# Patient Record
Sex: Female | Born: 1950 | Race: White | Hispanic: No | Smoking: Never smoker
Health system: Southern US, Community
[De-identification: ages and names within clinical notes are randomized; demographics above are authoritative.]

## PROBLEM LIST (undated history)

## (undated) DIAGNOSIS — R319 Hematuria, unspecified: Secondary | ICD-10-CM

## (undated) DIAGNOSIS — R3915 Urgency of urination: Secondary | ICD-10-CM

## (undated) DIAGNOSIS — Z87442 Personal history of urinary calculi: Secondary | ICD-10-CM

## (undated) DIAGNOSIS — R350 Frequency of micturition: Secondary | ICD-10-CM

## (undated) DIAGNOSIS — C4491 Basal cell carcinoma of skin, unspecified: Secondary | ICD-10-CM

## (undated) DIAGNOSIS — M1990 Unspecified osteoarthritis, unspecified site: Secondary | ICD-10-CM

## (undated) DIAGNOSIS — C439 Malignant melanoma of skin, unspecified: Secondary | ICD-10-CM

## (undated) DIAGNOSIS — R300 Dysuria: Secondary | ICD-10-CM

## (undated) DIAGNOSIS — T7840XA Allergy, unspecified, initial encounter: Secondary | ICD-10-CM

## (undated) DIAGNOSIS — M810 Age-related osteoporosis without current pathological fracture: Secondary | ICD-10-CM

## (undated) DIAGNOSIS — C801 Malignant (primary) neoplasm, unspecified: Secondary | ICD-10-CM

## (undated) DIAGNOSIS — I10 Essential (primary) hypertension: Secondary | ICD-10-CM

## (undated) DIAGNOSIS — Z973 Presence of spectacles and contact lenses: Secondary | ICD-10-CM

## (undated) DIAGNOSIS — N200 Calculus of kidney: Secondary | ICD-10-CM

## (undated) HISTORY — DX: Malignant melanoma of skin, unspecified: C43.9

## (undated) HISTORY — DX: Basal cell carcinoma of skin, unspecified: C44.91

## (undated) HISTORY — DX: Essential (primary) hypertension: I10

## (undated) HISTORY — DX: Age-related osteoporosis without current pathological fracture: M81.0

## (undated) HISTORY — PX: BRAIN SURGERY: SHX531

## (undated) HISTORY — DX: Allergy, unspecified, initial encounter: T78.40XA

## (undated) HISTORY — PX: JOINT REPLACEMENT: SHX530

---

## 1990-09-20 HISTORY — PX: HEMIARTHROPLASTY SHOULDER FRACTURE: SUR653

## 1992-09-20 HISTORY — PX: HEMIARTHROPLASTY SHOULDER FRACTURE: SUR653

## 1998-09-30 ENCOUNTER — Other Ambulatory Visit: Admission: RE | Admit: 1998-09-30 | Discharge: 1998-09-30 | Payer: Self-pay

## 1998-10-29 ENCOUNTER — Ambulatory Visit (HOSPITAL_COMMUNITY): Admission: RE | Admit: 1998-10-29 | Discharge: 1998-10-29 | Payer: Self-pay | Admitting: Obstetrics and Gynecology

## 1998-10-29 ENCOUNTER — Encounter: Payer: Self-pay | Admitting: Obstetrics and Gynecology

## 1999-03-19 ENCOUNTER — Encounter: Payer: Self-pay | Admitting: Obstetrics and Gynecology

## 1999-03-19 ENCOUNTER — Ambulatory Visit (HOSPITAL_COMMUNITY): Admission: RE | Admit: 1999-03-19 | Discharge: 1999-03-19 | Payer: Self-pay | Admitting: Obstetrics and Gynecology

## 1999-04-12 ENCOUNTER — Emergency Department (HOSPITAL_COMMUNITY): Admission: EM | Admit: 1999-04-12 | Discharge: 1999-04-12 | Payer: Self-pay | Admitting: Emergency Medicine

## 1999-04-12 ENCOUNTER — Encounter: Payer: Self-pay | Admitting: Emergency Medicine

## 1999-05-07 ENCOUNTER — Encounter: Admission: RE | Admit: 1999-05-07 | Discharge: 1999-08-05 | Payer: Self-pay | Admitting: Orthopedic Surgery

## 2012-12-26 ENCOUNTER — Encounter: Payer: Self-pay | Admitting: Nurse Practitioner

## 2013-03-19 ENCOUNTER — Ambulatory Visit (INDEPENDENT_AMBULATORY_CARE_PROVIDER_SITE_OTHER): Payer: No Typology Code available for payment source | Admitting: Physician Assistant

## 2013-03-19 ENCOUNTER — Encounter: Payer: Self-pay | Admitting: Physician Assistant

## 2013-03-19 VITALS — BP 143/83 | HR 84 | Temp 98.9°F | Ht 62.0 in | Wt 122.0 lb

## 2013-03-19 DIAGNOSIS — J329 Chronic sinusitis, unspecified: Secondary | ICD-10-CM

## 2013-03-19 MED ORDER — AZITHROMYCIN 250 MG PO TABS: ORAL_TABLET | ORAL | Status: DC

## 2013-03-19 NOTE — Progress Notes (Signed)
Subjective:     Patient ID: [name] Page, female   DOB: 1951-05-31, 62 y.o.   MRN: [id number]  HPI Pt with sinus pain/pressure for 2 weeks She also has had PND, dry cough, and loss of voice OTC cough meds have not helped sx Sx are worse with laying  Review of Systems  All other systems reviewed and are negative.       Objective:   Physical Exam  Nursing note and vitals reviewed. Ears- canal/TM's nl bilat Oral- no lesions, + thick PND, no increase in tonsil size No cerv nodes Heart- RRR w/o M Lungs- CTA     Assessment:     1. Sinusitis        Plan:     Zpack rx - pt states has worked well prev OTC Claritin D Fluids Rest F/U prn

## 2013-03-19 NOTE — Patient Instructions (Signed)

## 2013-08-10 ENCOUNTER — Encounter (HOSPITAL_COMMUNITY): Payer: Self-pay | Admitting: *Deleted

## 2013-08-10 ENCOUNTER — Observation Stay (HOSPITAL_COMMUNITY)
Admission: AD | Admit: 2013-08-10 | Discharge: 2013-08-11 | Disposition: A | Discharge status: Home / Self Care | Payer: PRIVATE HEALTH INSURANCE | Source: Ambulatory Visit | Attending: Urology | Admitting: Urology

## 2013-08-10 DIAGNOSIS — M810 Age-related osteoporosis without current pathological fracture: Secondary | ICD-10-CM | POA: Insufficient documentation

## 2013-08-10 DIAGNOSIS — N1330 Unspecified hydronephrosis: Secondary | ICD-10-CM | POA: Insufficient documentation

## 2013-08-10 DIAGNOSIS — R34 Anuria and oliguria: Secondary | ICD-10-CM | POA: Insufficient documentation

## 2013-08-10 DIAGNOSIS — N200 Calculus of kidney: Principal | ICD-10-CM | POA: Insufficient documentation

## 2013-08-10 LAB — MRSA PCR SCREENING: MRSA by PCR: NEGATIVE

## 2013-08-10 MED ORDER — INFLUENZA VAC SPLIT QUAD 0.5 ML IM SUSP
0.5000 mL | INTRAMUSCULAR | Status: AC
  Administered 2013-08-11: 0.5 mL via INTRAMUSCULAR
  Filled 2013-08-10 (×2): qty 0.5

## 2013-08-10 MED ORDER — PROMETHAZINE HCL 25 MG/ML IJ SOLN
25.0000 mg | INTRAMUSCULAR | Status: DC | PRN
  Administered 2013-08-10: 25 mg via INTRAVENOUS
  Filled 2013-08-10: qty 1

## 2013-08-10 MED ORDER — DOCUSATE SODIUM 100 MG PO CAPS
100.0000 mg | ORAL_CAPSULE | Freq: Two times a day (BID) | ORAL | Status: DC
  Administered 2013-08-10: 100 mg via ORAL
  Filled 2013-08-10 (×3): qty 1

## 2013-08-10 MED ORDER — HYDROMORPHONE HCL PF 1 MG/ML IJ SOLN
0.5000 mg | INTRAMUSCULAR | Status: DC | PRN
  Administered 2013-08-10: 1 mg via INTRAVENOUS
  Filled 2013-08-10: qty 1

## 2013-08-10 MED ORDER — OXYCODONE HCL 5 MG PO TABS
5.0000 mg | ORAL_TABLET | ORAL | Status: DC | PRN
  Filled 2013-08-10: qty 1

## 2013-08-10 MED ORDER — SENNA 8.6 MG PO TABS
1.0000 | ORAL_TABLET | Freq: Two times a day (BID) | ORAL | Status: DC
  Administered 2013-08-10: 8.6 mg via ORAL
  Filled 2013-08-10: qty 1

## 2013-08-10 MED ORDER — ONDANSETRON HCL 4 MG/2ML IJ SOLN
4.0000 mg | INTRAMUSCULAR | Status: DC | PRN
  Administered 2013-08-10: 18:00:00 4 mg via INTRAVENOUS
  Filled 2013-08-10: qty 2

## 2013-08-10 MED ORDER — KCL IN DEXTROSE-NACL 20-5-0.45 MEQ/L-%-% IV SOLN
INTRAVENOUS | Status: DC
  Administered 2013-08-10 – 2013-08-11 (×2): via INTRAVENOUS
  Filled 2013-08-10 (×3): qty 1000

## 2013-08-10 NOTE — H&P (Signed)
[name] Page is an 62 y.o. female.    Chief Complaint: Rt Ureteral Stone, Bilateral Intrarenal Stones, Refractory renal colic  HPI:    1 - Nephrolithiasis - Pt with long h/o bilateral stone disease. Most recnet CT 08/2012 from [place] with Rt 15mm intrarenal + scatteredbilateral intrarenal wihtout sig hydro. She has had at least 30 prior episodes all sponatnouesly passed. NO hematuria or recurrent UTI.  Recent Surveillance 05/2013 - [facility] / Renal US - Rt 15mm renal, 8mm lower pole. Lt 5mm upper pole likely stones 07/2013 - CT Rt 15mm lower pole, 6mm ureteral, Lt 5mm mid pole, scattered punctate.  2 - Medical Stone Disease / Oliguria / Hypocitraturia-   Eval 2014 - BMP,PTH,Urate - normal ; Composition - CaOx ; 24 hr Urine - low volume (0.5L/day) and low citrate. Subsequenly placed on K-Cit 20 MEQ BID  3 - Left Renal Cyst - 1.5cm lower pole non-complex cyst by [place] 05/2013.  4 - Acute Right Renal Colic - PT with 1 days mod-severe rt colickly flank pain with nausea and emesis. No fevers. UA without sig infectious parameters. CT today shows rt distal ureteral stone with hydro and bilateral intrarenal stones.     Past Medical History  Diagnosis Date  . Osteoporosis   . Kidney stone     Past Surgical History  Procedure Laterality Date  . Shoulder surgery Left 1992    partial shoulder replacement    Family History  Problem Relation Age of Onset  . Cancer Mother   . Atrial fibrillation Father   . Hyperlipidemia Father   . Hypertension Father   . Hypertension Sister   . Hyperlipidemia Sister   . Thyroid disease Sister    Social History:  reports that she has never smoked. She has never used smokeless tobacco. She reports that she does not drink alcohol or use illicit drugs.  Allergies:  Allergies  Allergen Reactions  . Sulfa Antibiotics     No prescriptions prior to admission    No results found for this or any previous visit (from the past 48 hour(s)). No results  found.  Review of Systems  Constitutional: Negative.  Negative for fever and chills.  Eyes: Negative.   Cardiovascular: Negative.   Gastrointestinal: Positive for nausea and vomiting.  Genitourinary: Positive for flank pain.  Musculoskeletal: Negative.   Skin: Negative.   Neurological: Negative.   Endo/Heme/Allergies: Negative.   Psychiatric/Behavioral: Negative.     There were no vitals taken for this visit. Physical Exam  Constitutional: She is oriented to person, place, and time. She appears well-developed and well-nourished.  HENT:  Head: Normocephalic and atraumatic.  Eyes: EOM are normal. Pupils are equal, round, and reactive to light.  Neck: Normal range of motion. Neck supple.  Cardiovascular: Normal rate.   Respiratory: Effort normal.  GI: Soft. Bowel sounds are normal.  Genitourinary:  Moderate Rt CVAT  Musculoskeletal: Normal range of motion.  Neurological: She is alert and oriented to person, place, and time.  Skin: Skin is warm and dry.  Psychiatric: She has a normal mood and affect. Her behavior is normal. Judgment and thought content normal.     Assessment/Plan  1 - Nephrolithiasis -  Large right, and lesser left stone burden with acute right sided colic. Again discussed options of medical therapy, intaptient admission for stents bilaterally in AM and then ureteroscopy in several weeks for goal of stone free and she has chosen the later. This is reasonable. Admit for pain /nausea control, NPO p  MN, Consent for bilateral stents in AM. Risks includign bleeding, infection, non-cure, damage to kidney / ureter / bladder as well as rare risks such as DVT, PT, MI, CVA, Mortality discussed.  2 - Medical Stone Disease / Oliguria / Hypocitraturia- Contiue K-Cit and increased hydration.   Hyun Reali 08/10/2013, 5:19 PM

## 2013-08-11 ENCOUNTER — Observation Stay (HOSPITAL_COMMUNITY): Payer: PRIVATE HEALTH INSURANCE | Admitting: Anesthesiology

## 2013-08-11 ENCOUNTER — Encounter (HOSPITAL_COMMUNITY): Payer: PRIVATE HEALTH INSURANCE | Admitting: Anesthesiology

## 2013-08-11 ENCOUNTER — Encounter (HOSPITAL_COMMUNITY)
Admission: AD | Disposition: A | Discharge status: Home / Self Care | Payer: Self-pay | Source: Ambulatory Visit | Attending: Urology

## 2013-08-11 HISTORY — PX: CYSTOSCOPY WITH RETROGRADE PYELOGRAM, URETEROSCOPY AND STENT PLACEMENT: SHX5789

## 2013-08-11 SURGERY — CYSTOURETEROSCOPY, WITH RETROGRADE PYELOGRAM AND STENT INSERTION
Anesthesia: General | Site: Ureter | Laterality: Bilateral | Wound class: Clean Contaminated

## 2013-08-11 MED ORDER — IOHEXOL 300 MG/ML  SOLN
INTRAMUSCULAR | Status: DC | PRN
  Administered 2013-08-11: 07:00:00 20 mL via INTRAVENOUS

## 2013-08-11 MED ORDER — FENTANYL CITRATE 0.05 MG/ML IJ SOLN: 25.0000 ug | INTRAMUSCULAR | Status: DC | PRN

## 2013-08-11 MED ORDER — CEFAZOLIN (ANCEF) 1 G IV SOLR: 1.0000 g | INTRAVENOUS | Status: DC

## 2013-08-11 MED ORDER — PROMETHAZINE HCL 25 MG/ML IJ SOLN: 6.2500 mg | INTRAMUSCULAR | Status: DC | PRN

## 2013-08-11 MED ORDER — LACTATED RINGERS IV SOLN
INTRAVENOUS | Status: DC | PRN
  Administered 2013-08-11: 07:00:00 via INTRAVENOUS

## 2013-08-11 MED ORDER — PROPOFOL 10 MG/ML IV BOLUS
INTRAVENOUS | Status: DC | PRN
  Administered 2013-08-11: 150 mg via INTRAVENOUS

## 2013-08-11 MED ORDER — CEFAZOLIN SODIUM-DEXTROSE 2-3 GM-% IV SOLR
INTRAVENOUS | Status: AC
  Filled 2013-08-11: qty 50

## 2013-08-11 MED ORDER — LIDOCAINE HCL (PF) 2 % IJ SOLN
INTRAMUSCULAR | Status: DC | PRN
  Administered 2013-08-11: 75 mg via INTRADERMAL

## 2013-08-11 MED ORDER — LACTATED RINGERS IV SOLN: INTRAVENOUS | Status: DC

## 2013-08-11 MED ORDER — ONDANSETRON HCL 4 MG/2ML IJ SOLN
INTRAMUSCULAR | Status: AC
  Filled 2013-08-11: qty 2

## 2013-08-11 MED ORDER — LIDOCAINE HCL (CARDIAC) 20 MG/ML IV SOLN
INTRAVENOUS | Status: AC
  Filled 2013-08-11: qty 5

## 2013-08-11 MED ORDER — ONDANSETRON HCL 4 MG/2ML IJ SOLN
INTRAMUSCULAR | Status: DC | PRN
  Administered 2013-08-11: 4 mg via INTRAVENOUS

## 2013-08-11 MED ORDER — SODIUM CHLORIDE 0.9 % IR SOLN
Status: DC | PRN
  Administered 2013-08-11: 1000 mL

## 2013-08-11 MED ORDER — OXYCODONE-ACETAMINOPHEN 5-325 MG PO TABS: 1.0000 | ORAL_TABLET | ORAL | Status: DC | PRN

## 2013-08-11 MED ORDER — PROPOFOL 10 MG/ML IV BOLUS
INTRAVENOUS | Status: AC
  Filled 2013-08-11: qty 20

## 2013-08-11 MED ORDER — FENTANYL CITRATE 0.05 MG/ML IJ SOLN
INTRAMUSCULAR | Status: DC | PRN
  Administered 2013-08-11: 50 ug via INTRAVENOUS
  Administered 2013-08-11 (×2): 25 ug via INTRAVENOUS

## 2013-08-11 MED ORDER — CEFAZOLIN SODIUM-DEXTROSE 2-3 GM-% IV SOLR
2.0000 g | Freq: Once | INTRAVENOUS | Status: AC
  Administered 2013-08-11: 2 g via INTRAVENOUS

## 2013-08-11 MED ORDER — FENTANYL CITRATE 0.05 MG/ML IJ SOLN
INTRAMUSCULAR | Status: AC
  Filled 2013-08-11: qty 2

## 2013-08-11 MED ORDER — SENNOSIDES-DOCUSATE SODIUM 8.6-50 MG PO TABS: 1.0000 | ORAL_TABLET | Freq: Two times a day (BID) | ORAL | Status: DC

## 2013-08-11 MED ORDER — 0.9 % SODIUM CHLORIDE (POUR BTL) OPTIME
TOPICAL | Status: DC | PRN
  Administered 2013-08-11: 07:00:00 1000 mL

## 2013-08-11 MED ORDER — OXYBUTYNIN CHLORIDE 5 MG PO TABS: 5.0000 mg | ORAL_TABLET | Freq: Three times a day (TID) | ORAL | Status: DC | PRN

## 2013-08-11 SURGICAL SUPPLY — 24 items
BAG URINE DRAINAGE (UROLOGICAL SUPPLIES) ×2 IMPLANT
BASKET LASER NITINOL 1.9FR (BASKET) IMPLANT
BASKET STNLS GEMINI 4WIRE 3FR (BASKET) IMPLANT
BASKET ZERO TIP NITINOL 2.4FR (BASKET) IMPLANT
BSKT STON RTRVL 120 1.9FR (BASKET)
BSKT STON RTRVL GEM 120X11 3FR (BASKET)
BSKT STON RTRVL ZERO TP 2.4FR (BASKET)
CATH INTERMIT  6FR 70CM (CATHETERS) ×2 IMPLANT
DRAPE CAMERA CLOSED 9X96 (DRAPES) ×2 IMPLANT
ELECT REM PT RETURN 9FT ADLT (ELECTROSURGICAL)
ELECTRODE REM PT RTRN 9FT ADLT (ELECTROSURGICAL) IMPLANT
FIBER LASER FLEXIVA 200 (UROLOGICAL SUPPLIES) IMPLANT
FIBER LASER FLEXIVA 365 (UROLOGICAL SUPPLIES) IMPLANT
GLOVE BIOGEL M STRL SZ7.5 (GLOVE) ×2 IMPLANT
GOWN PREVENTION PLUS LG XLONG (DISPOSABLE) ×2 IMPLANT
GUIDEWIRE ANG ZIPWIRE 038X150 (WIRE) ×2 IMPLANT
GUIDEWIRE STR DUAL SENSOR (WIRE) ×2 IMPLANT
IV NS IRRIG 3000ML ARTHROMATIC (IV SOLUTION) ×2 IMPLANT
PACK CYSTO (CUSTOM PROCEDURE TRAY) ×2 IMPLANT
STENT CONTOUR 6FRX24X.038 (STENTS) ×1 IMPLANT
STENT POLARIS 5FRX24 (STENTS) ×1 IMPLANT
SYRINGE 10CC LL (SYRINGE) ×1 IMPLANT
SYRINGE IRR TOOMEY STRL 70CC (SYRINGE) IMPLANT
TUBE FEEDING 8FR 16IN STR KANG (MISCELLANEOUS) ×2 IMPLANT

## 2013-08-11 NOTE — Progress Notes (Signed)
Pt with no nausea, has no problem voiding, ambulates well, d/c home at this time

## 2013-08-11 NOTE — Progress Notes (Signed)
Pt has voided pink tinged urine since returning from surgery, ambulated in room with post op weakness, tolerated gingerale.  Will order lunch tray and reassess.

## 2013-08-11 NOTE — Anesthesia Postprocedure Evaluation (Signed)
  Anesthesia Post-op Note  Patient: [name] Page  Procedure(s) Performed: Procedure(s) (LRB): CYSTOSCOPY WITH RETROGRADE PYELOGRAM, URETEROSCOPY AND STENT PLACEMENT (Bilateral)  Patient Location: PACU  Anesthesia Type: General  Level of Consciousness: awake and alert   Airway and Oxygen Therapy: Patient Spontanous Breathing  Post-op Pain: mild  Post-op Assessment: Post-op Vital signs reviewed, Patient's Cardiovascular Status Stable, Respiratory Function Stable, Patent Airway and No signs of Nausea or vomiting  Last Vitals:  Filed Vitals:   08/11/13 0828  BP: 159/68  Pulse: 83  Temp: 36.9 C  Resp: 14    Post-op Vital Signs: stable   Complications: No apparent anesthesia complications

## 2013-08-11 NOTE — Transfer of Care (Signed)
Immediate Anesthesia Transfer of Care Note  Patient: [name] Page  Procedure(s) Performed: Procedure(s): CYSTOSCOPY WITH RETROGRADE PYELOGRAM, URETEROSCOPY AND STENT PLACEMENT (Bilateral)  Patient Location: PACU  Anesthesia Type:General  Level of Consciousness: awake, alert , oriented and patient cooperative  Airway & Oxygen Therapy: Patient Spontanous Breathing and Patient connected to face mask oxygen  Post-op Assessment: Report given to PACU RN, Post -op Vital signs reviewed and stable and Patient moving all extremities X 4  Post vital signs: Reviewed and stable  Complications: No apparent anesthesia complications

## 2013-08-11 NOTE — Anesthesia Preprocedure Evaluation (Addendum)
Anesthesia Evaluation  Patient identified by MRN, date of birth, ID band Patient awake    Reviewed: Allergy & Precautions, H&P , NPO status , Patient's Chart, lab work & pertinent test results  History of Anesthesia Complications Negative for: history of anesthetic complications  Airway Mallampati: II TM Distance: >3 FB Neck ROM: Full    Dental no notable dental hx.    Pulmonary neg pulmonary ROS,  breath sounds clear to auscultation  Pulmonary exam normal       Cardiovascular Exercise Tolerance: Good negative cardio ROS  Rhythm:Regular Rate:Normal     Neuro/Psych negative neurological ROS  negative psych ROS   GI/Hepatic negative GI ROS, Neg liver ROS,   Endo/Other  negative endocrine ROS  Renal/GU Renal diseasenegative Renal ROSH/o kidney stones  negative genitourinary   Musculoskeletal negative musculoskeletal ROS (+)   Abdominal   Peds negative pediatric ROS (+)  Hematology negative hematology ROS (+)   Anesthesia Other Findings H/o osteoporosis  Reproductive/Obstetrics negative OB ROS                         Anesthesia Physical Anesthesia Plan  ASA: II  Anesthesia Plan: General   Post-op Pain Management:    Induction: Intravenous  Airway Management Planned: LMA  Additional Equipment:   Intra-op Plan:   Post-operative Plan: Extubation in OR  Informed Consent: I have reviewed the patients History and Physical, chart, labs and discussed the procedure including the risks, benefits and alternatives for the proposed anesthesia with the patient or authorized representative who has indicated his/her understanding and acceptance.   Dental advisory given  Plan Discussed with: CRNA  Anesthesia Plan Comments:         Anesthesia Quick Evaluation

## 2013-08-11 NOTE — Brief Op Note (Signed)
08/10/2013 - 08/11/2013  7:47 AM  PATIENT:  [name] Page  62 y.o. female  PRE-OPERATIVE DIAGNOSIS:  bilateral stones  POST-OPERATIVE DIAGNOSIS:  bilateral stones  PROCEDURE:  Procedure(s): CYSTOSCOPY WITH RETROGRADE PYELOGRAM, URETEROSCOPY AND STENT PLACEMENT (Bilateral)  SURGEON:  Surgeon(s) and Role:    * Sebastian Ache, MD - Primary  PHYSICIAN ASSISTANT:   ASSISTANTS: none   ANESTHESIA:   general  EBL:     BLOOD ADMINISTERED:none  DRAINS: none   LOCAL MEDICATIONS USED:  NONE  SPECIMEN:  No Specimen  DISPOSITION OF SPECIMEN:  N/A  COUNTS:  YES  TOURNIQUET:  * No tourniquets in log *  DICTATION: .Other Dictation: Dictation Number [id number]  PLAN OF CARE: Discharge to home after PACU  PATIENT DISPOSITION:  PACU - hemodynamically stable.   Delay start of Pharmacological VTE agent (>24hrs) due to surgical blood loss or risk of bleeding: not applicable

## 2013-08-12 NOTE — Op Note (Signed)
[name], [name]              ACCOUNT NO.:  [id number]  MEDICAL RECORD NO.:  [id number]  LOCATION:  1443                         FACILITY:  [facility]  PHYSICIAN:  Sebastian Ache, MD     DATE OF BIRTH:  07/15/1951  DATE OF PROCEDURE: 08/11/2013                              OPERATIVE REPORT   DIAGNOSIS:  Right ureteral stone, bilateral renal stones, refractory renal colic.  PROCEDURE:  Cystoscopy, bilateral retrograde pyelogram interpretation, insertion of bilateral ureteral stents, right 5 x 24 Polaris, no tether, left 6 x 24 Contour, no tether.  FINDINGS: 1. Right moderate hydroureteronephrosis to filling defect in distal     ureter consistent with stone. 2. Unremarkable left retrograde pyelogram. 3. Unremarkable urinary bladder.  INDICATION:  [name] is a very pleasant 62 year old lady with history of recurrent nephrolithiasis for many years, mostly managed medically.  She presented yesterday to my office with refractory colic and right flank pain and nausea and vomiting.  A CT scan corroborated a right distal ureteral stone and multifocal bilateral renal stones. Options were discussed including outpatient medical therapy, inpatient medical therapy, as well as cystoscopy, bilateral ureteral stents as bridge to definitive stone therapy with goal of stone free, and she wished to proceed with the latter more aggressive approach.  Informed consent was obtained and placed in medical record.  As the patient was not n.p.o. yesterday, she was admitted for IV fluids and pain control with plan for elective procedure today.  PROCEDURE IN DETAIL:  The patient being, [name] [name], verified. Procedure being cystoscopy, bilateral ureteral stents was confirmed. Procedure was carried out.  Time-out was performed.  Intravenous antibiotics were administered.  General LMA anesthesia was introduced. The patient was placed into a low lithotomy position.  Sterile field was created by  prepping and draping the patient's vagina, introitus, and proximal thighs using iodine x3.  Next, cystourethroscopy was performed using a 22-French rigid cystoscope with 12-degree offset lens. Inspection of the urinary bladder revealed no diverticula, calcifications, papular lesions.  The right ureteral orifice was cannulated with a 5-French end-hole catheter and right retrograde pyelogram was obtained.  Right retrograde pyelogram demonstrated single right ureter, single system, right kidney.  There was a filling defect in distal ureter consistent with known stone.  There was moderate hydroureteronephrosis above this.  A 0.038 Glidewire was advanced at the level of the upper pole over which a new 5 x 24 Polaris type stent was placed.  Good proximal and distal deployment were noted.  There was efflux of copious proteinaceous-appearing urine around into the distal end of the stent following placement.  Attention was then directed to the left side.  The left ureteral orifice was cannulated with a 5-French end-hole catheter and left retrograde pyelogram was obtained.  Left retrograde pyelogram demonstrated a single left ureter, single system left kidney.  No filling defects or narrowing noted.  A 0.038 Glidewire was advanced at the level of the upper pole, over which a new 6 x 24 contour stent was placed, good proximal and distal curl were noted.  Efflux of urine was seen around and through the distal end of the stent.  Bladder was emptied per cystoscope.  Procedure was then  terminated.  The patient tolerated the procedure well.  There were no immediate periprocedural complications.  The patient was taken to postanesthesia care unit in stable condition.          ______________________________ Sebastian Ache, MD     TM/MEDQ  D:  08/11/2013  T:  08/12/2013  Job:  [id number]

## 2013-08-13 ENCOUNTER — Encounter (HOSPITAL_COMMUNITY): Payer: Self-pay | Admitting: Urology

## 2013-08-14 ENCOUNTER — Other Ambulatory Visit: Payer: Self-pay | Admitting: Urology

## 2013-08-21 NOTE — Discharge Summary (Signed)
Physician Discharge Summary  Patient ID: [name] DEGUZMAN MRN: [id number] DOB/AGE: [birth year] 62 y.o.  Admit date: 08/10/2013 Discharge date: 08/21/2013  Admission Diagnoses: Bilateral Nephrolithiasis  Discharge Diagnoses:  Active Problems:   * No active hospital problems. *   Discharged Condition: good  Hospital Course:  Pt admitted from office 11/21 with refracotry pain, nausea, vomitting from bilateral nephrolithiasis. ON AM of 11/22 she underwent operative bilateral ureteral stenting. By PM of 11/22 her pain was controlled, ambulatoyr, voiding, and felt to be adequate for discharge.   Consults: None  Significant Diagnostic Studies: inraoperative retrograde pyleograms as per op note  Treatments: surgery: operative bilateral ureteral stenting 08/11/2013  Discharge Exam: Blood pressure 159/68, pulse 83, temperature 98.4 F (36.9 C), temperature source Oral, resp. rate 14, height 5\' 2"  (1.575 m), weight 55.5 kg (122 lb 5.7 oz), SpO2 97.00%. General appearance: alert, cooperative and appears stated age Head: Normocephalic, without obvious abnormality, atraumatic Eyes: conjunctivae/corneas clear. PERRL, EOM's intact. Fundi benign. Ears: normal TM's and external ear canals both ears Nose: Nares normal. Septum midline. Mucosa normal. No drainage or sinus tenderness. Throat: lips, mucosa, and tongue normal; teeth and gums normal Neck: no adenopathy, no carotid bruit, no JVD, supple, symmetrical, trachea midline and thyroid not enlarged, symmetric, no tenderness/mass/nodules Back: symmetric, no curvature. ROM normal. No CVA tenderness. Resp: clear to auscultation bilaterally Cardio: regular rate and rhythm, S1, S2 normal, no murmur, click, rub or gallop GI: soft, non-tender; bowel sounds normal; no masses,  no organomegaly Extremities: extremities normal, atraumatic, no cyanosis or edema Pulses: 2+ and symmetric Skin: Skin color, texture, turgor normal. No rashes or lesions Lymph  nodes: Cervical, supraclavicular, and axillary nodes normal. Neurologic: Grossly normal  Disposition: 01-Home or Self Care     Medication List         alendronate 70 MG tablet  Commonly known as:  FOSAMAX  Take 70 mg by mouth every 7 (seven) days. Take with a full glass of water on an empty stomach.     multivitamin with minerals Tabs tablet  Take 1 tablet by mouth daily.     oxybutynin 5 MG tablet  Commonly known as:  DITROPAN  Take 1 tablet (5 mg total) by mouth every 8 (eight) hours as needed for bladder spasms. Or stent discomfort.     oxyCODONE-acetaminophen 5-325 MG per tablet  Commonly known as:  ROXICET  Take 1 tablet by mouth every 4 (four) hours as needed for severe pain. Post-operatively     potassium citrate 10 MEQ (1080 MG) SR tablet  Commonly known as:  UROCIT-K  Take 10 mEq by mouth 3 (three) times daily with meals.     senna-docusate 8.6-50 MG per tablet  Commonly known as:  Senokot-S  Take 1 tablet by mouth 2 (two) times daily. While taking pain meds to prevent constipation           Follow-up Information   Follow up with Sebastian Ache, MD. (We will contact you to arrange next surgery in several weeks.)    Specialty:  Urology   Contact information:   [address] [place] [address] [phone]       Signed: Sebastian Ache 08/21/2013, 5:14 PM

## 2013-08-28 ENCOUNTER — Encounter (HOSPITAL_BASED_OUTPATIENT_CLINIC_OR_DEPARTMENT_OTHER): Payer: Self-pay | Admitting: *Deleted

## 2013-08-28 NOTE — Progress Notes (Signed)
NPO AFTER MN. ARRIVE AT 1610. NEEDS ISTAT 8. IF NEEDED MAY TAKE OXYCODONE , OXYBUTYNIN AM DOS W/ SIPS OF WATER.

## 2013-08-29 ENCOUNTER — Ambulatory Visit (HOSPITAL_BASED_OUTPATIENT_CLINIC_OR_DEPARTMENT_OTHER)
Admission: RE | Admit: 2013-08-29 | Discharge: 2013-08-29 | Disposition: A | Discharge status: Home / Self Care | Payer: PRIVATE HEALTH INSURANCE | Source: Ambulatory Visit | Attending: Urology | Admitting: Urology

## 2013-08-29 ENCOUNTER — Encounter (HOSPITAL_BASED_OUTPATIENT_CLINIC_OR_DEPARTMENT_OTHER): Payer: PRIVATE HEALTH INSURANCE | Admitting: Anesthesiology

## 2013-08-29 ENCOUNTER — Encounter (HOSPITAL_BASED_OUTPATIENT_CLINIC_OR_DEPARTMENT_OTHER)
Admission: RE | Disposition: A | Discharge status: Home / Self Care | Payer: Self-pay | Source: Ambulatory Visit | Attending: Urology

## 2013-08-29 ENCOUNTER — Encounter (HOSPITAL_BASED_OUTPATIENT_CLINIC_OR_DEPARTMENT_OTHER): Payer: Self-pay | Admitting: *Deleted

## 2013-08-29 ENCOUNTER — Ambulatory Visit (HOSPITAL_BASED_OUTPATIENT_CLINIC_OR_DEPARTMENT_OTHER): Payer: PRIVATE HEALTH INSURANCE | Admitting: Anesthesiology

## 2013-08-29 DIAGNOSIS — N200 Calculus of kidney: Secondary | ICD-10-CM | POA: Insufficient documentation

## 2013-08-29 DIAGNOSIS — M810 Age-related osteoporosis without current pathological fracture: Secondary | ICD-10-CM | POA: Insufficient documentation

## 2013-08-29 DIAGNOSIS — N201 Calculus of ureter: Secondary | ICD-10-CM | POA: Insufficient documentation

## 2013-08-29 HISTORY — DX: Presence of spectacles and contact lenses: Z97.3

## 2013-08-29 HISTORY — DX: Calculus of kidney: N20.0

## 2013-08-29 HISTORY — DX: Frequency of micturition: R35.0

## 2013-08-29 HISTORY — DX: Personal history of urinary calculi: Z87.442

## 2013-08-29 HISTORY — PX: CYSTOSCOPY WITH URETEROSCOPY AND STENT PLACEMENT: SHX6377

## 2013-08-29 HISTORY — DX: Unspecified osteoarthritis, unspecified site: M19.90

## 2013-08-29 HISTORY — DX: Urgency of urination: R39.15

## 2013-08-29 HISTORY — PX: HOLMIUM LASER APPLICATION: SHX5852

## 2013-08-29 HISTORY — DX: Dysuria: R30.0

## 2013-08-29 HISTORY — DX: Hematuria, unspecified: R31.9

## 2013-08-29 LAB — POCT I-STAT, CHEM 8
BUN: 25 mg/dL — ABNORMAL HIGH (ref 6–23)
Chloride: 107 mEq/L (ref 96–112)
Potassium: 4.1 mEq/L (ref 3.5–5.1)
Sodium: 145 mEq/L (ref 135–145)
TCO2: 22 mmol/L (ref 0–100)

## 2013-08-29 SURGERY — CYSTOURETEROSCOPY, WITH STENT INSERTION
Anesthesia: General | Site: Ureter | Laterality: Bilateral

## 2013-08-29 MED ORDER — FENTANYL CITRATE 0.05 MG/ML IJ SOLN
INTRAMUSCULAR | Status: AC
  Filled 2013-08-29: qty 2

## 2013-08-29 MED ORDER — SODIUM CHLORIDE 0.9 % IR SOLN
Status: DC | PRN
  Administered 2013-08-29: 5000 mL

## 2013-08-29 MED ORDER — LACTATED RINGERS IV SOLN
INTRAVENOUS | Status: DC
  Administered 2013-08-29 (×2): via INTRAVENOUS
  Filled 2013-08-29: qty 1000

## 2013-08-29 MED ORDER — LIDOCAINE HCL (CARDIAC) 20 MG/ML IV SOLN
INTRAVENOUS | Status: DC | PRN
  Administered 2013-08-29: 80 mg via INTRAVENOUS

## 2013-08-29 MED ORDER — KETOROLAC TROMETHAMINE 30 MG/ML IJ SOLN
INTRAMUSCULAR | Status: DC | PRN
  Administered 2013-08-29: 30 mg via INTRAVENOUS

## 2013-08-29 MED ORDER — FENTANYL CITRATE 0.05 MG/ML IJ SOLN
25.0000 ug | INTRAMUSCULAR | Status: DC | PRN
  Filled 2013-08-29: qty 1

## 2013-08-29 MED ORDER — MIDAZOLAM HCL 5 MG/5ML IJ SOLN
INTRAMUSCULAR | Status: DC | PRN
  Administered 2013-08-29: 2 mg via INTRAVENOUS

## 2013-08-29 MED ORDER — GENTAMICIN SULFATE 40 MG/ML IJ SOLN
5.0000 mg/kg | Freq: Once | INTRAVENOUS | Status: AC
  Administered 2013-08-29: 270 mg via INTRAVENOUS
  Filled 2013-08-29: qty 6.75

## 2013-08-29 MED ORDER — ACETAMINOPHEN 10 MG/ML IV SOLN
INTRAVENOUS | Status: DC | PRN
  Administered 2013-08-29: 1000 mg via INTRAVENOUS

## 2013-08-29 MED ORDER — MIDAZOLAM HCL 2 MG/2ML IJ SOLN
INTRAMUSCULAR | Status: AC
  Filled 2013-08-29: qty 2

## 2013-08-29 MED ORDER — FENTANYL CITRATE 0.05 MG/ML IJ SOLN
INTRAMUSCULAR | Status: DC | PRN
  Administered 2013-08-29 (×2): 50 ug via INTRAVENOUS

## 2013-08-29 MED ORDER — PROPOFOL 10 MG/ML IV BOLUS
INTRAVENOUS | Status: DC | PRN
  Administered 2013-08-29: 30 mg via INTRAVENOUS
  Administered 2013-08-29: 150 mg via INTRAVENOUS

## 2013-08-29 MED ORDER — OXYCODONE-ACETAMINOPHEN 5-325 MG PO TABS: 1.0000 | ORAL_TABLET | ORAL | Status: DC | PRN

## 2013-08-29 MED ORDER — GENTAMICIN IN SALINE 1.6-0.9 MG/ML-% IV SOLN
80.0000 mg | INTRAVENOUS | Status: DC
  Filled 2013-08-29: qty 50

## 2013-08-29 MED ORDER — ONDANSETRON HCL 4 MG/2ML IJ SOLN
INTRAMUSCULAR | Status: DC | PRN
  Administered 2013-08-29: 4 mg via INTRAVENOUS

## 2013-08-29 MED ORDER — EPHEDRINE SULFATE 50 MG/ML IJ SOLN
INTRAMUSCULAR | Status: DC | PRN
  Administered 2013-08-29: 10 mg via INTRAVENOUS

## 2013-08-29 MED ORDER — CIPROFLOXACIN HCL 500 MG PO TABS: 500.0000 mg | ORAL_TABLET | Freq: Two times a day (BID) | ORAL | Status: DC

## 2013-08-29 MED ORDER — PROMETHAZINE HCL 25 MG/ML IJ SOLN
6.2500 mg | INTRAMUSCULAR | Status: DC | PRN
  Filled 2013-08-29: qty 1

## 2013-08-29 MED ORDER — DEXAMETHASONE SODIUM PHOSPHATE 4 MG/ML IJ SOLN
INTRAMUSCULAR | Status: DC | PRN
  Administered 2013-08-29: 10 mg via INTRAVENOUS

## 2013-08-29 MED ORDER — IOHEXOL 350 MG/ML SOLN
INTRAVENOUS | Status: DC | PRN
  Administered 2013-08-29: 24 mL

## 2013-08-29 SURGICAL SUPPLY — 51 items
BAG DRAIN URO-CYSTO SKYTR STRL (DRAIN) ×2 IMPLANT
BAG DRN UROCATH (DRAIN) ×1
BAG URO CATCHER STRL LF (DRAPE) ×2 IMPLANT
BASKET LASER NITINOL 1.9FR (BASKET) ×2 IMPLANT
BASKET STNLS GEMINI 4WIRE 3FR (BASKET) IMPLANT
BASKET ZERO TIP NITINOL 2.4FR (BASKET) IMPLANT
BRUSH URET BIOPSY 3F (UROLOGICAL SUPPLIES) IMPLANT
BSKT STON RTRVL 120 1.9FR (BASKET) ×1
BSKT STON RTRVL GEM 120X11 3FR (BASKET)
BSKT STON RTRVL ZERO TP 2.4FR (BASKET)
CANISTER SUCT LVC 12 LTR MEDI- (MISCELLANEOUS) ×2 IMPLANT
CATH INTERMIT  6FR 70CM (CATHETERS) ×2 IMPLANT
CATH URET 5FR 28IN CONE TIP (BALLOONS)
CATH URET 5FR 28IN OPEN ENDED (CATHETERS) IMPLANT
CATH URET 5FR 70CM CONE TIP (BALLOONS) IMPLANT
CLOTH BEACON ORANGE TIMEOUT ST (SAFETY) ×2 IMPLANT
DRAPE CAMERA CLOSED 9X96 (DRAPES) ×2 IMPLANT
ELECT REM PT RETURN 9FT ADLT (ELECTROSURGICAL)
ELECTRODE REM PT RTRN 9FT ADLT (ELECTROSURGICAL) IMPLANT
FIBER LASER FLEXIVA 200 (UROLOGICAL SUPPLIES) IMPLANT
FIBER LASER FLEXIVA 365 (UROLOGICAL SUPPLIES) IMPLANT
FIBER LASER TRAC TIP (UROLOGICAL SUPPLIES) ×1 IMPLANT
GLOVE BIO SURGEON STRL SZ7 (GLOVE) ×2 IMPLANT
GLOVE BIO SURGEON STRL SZ7.5 (GLOVE) ×2 IMPLANT
GLOVE BIOGEL M 6.5 STRL (GLOVE) ×1 IMPLANT
GLOVE BIOGEL PI IND STRL 7.5 (GLOVE) IMPLANT
GLOVE BIOGEL PI INDICATOR 7.5 (GLOVE) ×3
GLOVE ECLIPSE 6.5 STRL STRAW (GLOVE) ×1 IMPLANT
GOWN PREVENTION PLUS LG XLONG (DISPOSABLE) ×1 IMPLANT
GOWN PREVENTION PLUS XLARGE (GOWN DISPOSABLE) ×2 IMPLANT
GOWN STRL NON-REIN LRG LVL3 (GOWN DISPOSABLE) ×4 IMPLANT
GOWN STRL REIN XL XLG (GOWN DISPOSABLE) ×1 IMPLANT
GUIDEWIRE 0.038 PTFE COATED (WIRE) IMPLANT
GUIDEWIRE ANG ZIPWIRE 038X150 (WIRE) ×2 IMPLANT
GUIDEWIRE STR DUAL SENSOR (WIRE) ×2 IMPLANT
IV NS 1000ML (IV SOLUTION) ×4
IV NS 1000ML BAXH (IV SOLUTION) IMPLANT
IV NS IRRIG 3000ML ARTHROMATIC (IV SOLUTION) ×3 IMPLANT
KIT BALLIN UROMAX 15FX10 (LABEL) IMPLANT
KIT BALLN UROMAX 15FX4 (MISCELLANEOUS) IMPLANT
KIT BALLN UROMAX 26 75X4 (MISCELLANEOUS)
PACK CYSTOSCOPY (CUSTOM PROCEDURE TRAY) ×2 IMPLANT
SET HIGH PRES BAL DIL (LABEL)
SHEATH ACCESS URETERAL 24CM (SHEATH) ×1 IMPLANT
SHEATH URET ACCESS 12FR/35CM (UROLOGICAL SUPPLIES) IMPLANT
SHEATH URET ACCESS 12FR/55CM (UROLOGICAL SUPPLIES) IMPLANT
STENT CONTOUR 6FRX24X.038 (STENTS) ×2 IMPLANT
SYR 3ML 23GX1 SAFETY (SYRINGE) ×1 IMPLANT
SYRINGE 10CC LL (SYRINGE) ×2 IMPLANT
SYRINGE IRR TOOMEY STRL 70CC (SYRINGE) IMPLANT
TUBE FEEDING 8FR 16IN STR KANG (MISCELLANEOUS) ×1 IMPLANT

## 2013-08-29 NOTE — Anesthesia Procedure Notes (Signed)
Procedure Name: LMA Insertion Date/Time: 08/29/2013 10:37 AM Performed by: Maris Berger T Pre-anesthesia Checklist: Patient identified, Emergency Drugs available, Suction available and Patient being monitored Patient Re-evaluated:Patient Re-evaluated prior to inductionOxygen Delivery Method: Circle System Utilized Preoxygenation: Pre-oxygenation with 100% oxygen Intubation Type: IV induction Ventilation: Mask ventilation without difficulty LMA: LMA inserted LMA Size: 4.0 Number of attempts: 1 Airway Equipment and Method: bite block Placement Confirmation: positive ETCO2 Tube secured with: Tape Dental Injury: Teeth and Oropharynx as per pre-operative assessment

## 2013-08-29 NOTE — Brief Op Note (Signed)
08/29/2013  12:54 PM  PATIENT:  [name] Page  62 y.o. female  PRE-OPERATIVE DIAGNOSIS:  Bilateral Renal Stone  POST-OPERATIVE DIAGNOSIS:  bilateral renal stone  PROCEDURE:  Procedure(s): CYSTOSCOPY WITH BILATERAL URETEROSCOPY AND STENT REPLACEMENTS, stone extraction (Bilateral) HOLMIUM LASER APPLICATION (Bilateral)  SURGEON:  Surgeon(s) and Role:    * Sebastian Ache, MD - Primary  PHYSICIAN ASSISTANT:   ASSISTANTS: none   ANESTHESIA:   general  EBL:  Total I/O In: 1300 [I.V.:1300] Out: -   BLOOD ADMINISTERED:none  DRAINS: none   LOCAL MEDICATIONS USED:  NONE  SPECIMEN:  Source of Specimen:  Rt and Lt Renal / Ureteral stones  DISPOSITION OF SPECIMEN:  [facility] for compositioal analysis  COUNTS:  YES  TOURNIQUET:  * No tourniquets in log *  DICTATION: .Other Dictation: Dictation Number [id number]  PLAN OF CARE: Discharge to home after PACU  PATIENT DISPOSITION:  PACU - hemodynamically stable.   Delay start of Pharmacological VTE agent (>24hrs) due to surgical blood loss or risk of bleeding: no

## 2013-08-29 NOTE — Anesthesia Postprocedure Evaluation (Signed)
  Anesthesia Post-op Note  Patient: [name] Page  Procedure(s) Performed: Procedure(s) (LRB): CYSTOSCOPY WITH BILATERAL URETEROSCOPY AND STENT REPLACEMENTS, stone extraction (Bilateral) HOLMIUM LASER APPLICATION (Bilateral)  Patient Location: PACU  Anesthesia Type: General  Level of Consciousness: awake and alert   Airway and Oxygen Therapy: Patient Spontanous Breathing  Post-op Pain: mild  Post-op Assessment: Post-op Vital signs reviewed, Patient's Cardiovascular Status Stable, Respiratory Function Stable, Patent Airway and No signs of Nausea or vomiting  Last Vitals:  Filed Vitals:   08/29/13 1330  BP: 138/76  Pulse: 83  Temp:   Resp: 15    Post-op Vital Signs: stable   Complications: No apparent anesthesia complications

## 2013-08-29 NOTE — Transfer of Care (Signed)
Immediate Anesthesia Transfer of Care Note  Patient: [name] Page  Procedure(s) Performed: Procedure(s): CYSTOSCOPY WITH BILATERAL URETEROSCOPY AND STENT REPLACEMENTS, stone extraction (Bilateral) HOLMIUM LASER APPLICATION (Bilateral)  Patient Location: PACU  Anesthesia Type:General  Level of Consciousness: sedated  Airway & Oxygen Therapy: Patient Spontanous Breathing and Patient connected to nasal cannula oxygen  Post-op Assessment: Report given to PACU RN  Post vital signs: Reviewed and stable  Complications: No apparent anesthesia complications

## 2013-08-29 NOTE — H&P (Signed)
[name] Page is an 62 y.o. female.    Chief Complaint: Pre-Op Bilateral Ureteroscopic Stone Manipulation  HPI:   1 - Nephrolithiasis / Rt Ureteral and Bilateral Renal Stones- Pt with long h/o bilateral stone disease. Most recnet CT with rt 6mm ureteral 15mm rt lower pole and lef puncate stones s/p urgent bilateral stenting last month for acute colic.  She has had at least 30 prior episodes all sponatnouesly passed. NO hematuria or recurrent UTI.   Today [name] is seen to proceed with bilateral ureteroscopic stone manipulation. No interval fevers. Most recent UA w/o infectoius parameters.  Past Medical History  Diagnosis Date  . Osteoporosis   . Renal calculus, bilateral   . Frequency of urination   . Urgency of urination   . Hematuria   . History of kidney stones   . Dysuria   . Arthritis   . Wears glasses     Past Surgical History  Procedure Laterality Date  . Cystoscopy with retrograde pyelogram, ureteroscopy and stent placement Bilateral 08/11/2013    Procedure: CYSTOSCOPY WITH RETROGRADE PYELOGRAM, URETEROSCOPY AND STENT PLACEMENT;  Surgeon: Sebastian Ache, MD;  Location: [facility];  Service: Urology;  Laterality: Bilateral;  . Hemiarthroplasty shoulder fracture Left 1992    Family History  Problem Relation Age of Onset  . Cancer Mother   . Atrial fibrillation Father   . Hyperlipidemia Father   . Hypertension Father   . Hypertension Sister   . Hyperlipidemia Sister   . Thyroid disease Sister    Social History:  reports that she has never smoked. She has never used smokeless tobacco. She reports that she does not drink alcohol or use illicit drugs.  Allergies:  Allergies  Allergen Reactions  . Sulfa Antibiotics Rash    No prescriptions prior to admission    No results found for this or any previous visit (from the past 48 hour(s)). No results found.  Review of Systems  Constitutional: Negative.  Negative for fever and chills.  HENT: Negative.   Eyes:  Negative.   Respiratory: Negative.   Cardiovascular: Negative.   Gastrointestinal: Negative.  Negative for vomiting.  Genitourinary: Negative.   Musculoskeletal: Negative.   Skin: Negative.   Neurological: Negative.   Endo/Heme/Allergies: Negative.   Psychiatric/Behavioral: Negative.     Height 5\' 2"  (1.575 m), weight 55.339 kg (122 lb). Physical Exam  Constitutional: She is oriented to person, place, and time. She appears well-developed and well-nourished.  HENT:  Head: Normocephalic and atraumatic.  Eyes: EOM are normal. Pupils are equal, round, and reactive to light.  Neck: Normal range of motion. Neck supple.  Cardiovascular: Normal rate.   Respiratory: Effort normal and breath sounds normal.  GI: Soft. Bowel sounds are normal.  Genitourinary:  No CVAT  Musculoskeletal: Normal range of motion.  Neurological: She is alert and oriented to person, place, and time.  Skin: Skin is warm and dry.  Psychiatric: She has a normal mood and affect. Her behavior is normal. Judgment and thought content normal.     Assessment/Plan  1 - Nephrolithiasis / Rt Ureteral and Bilateral Renal Stones- We rediscussed ureteroscopic stone manipulation with basketing and laser-lithotripsy in detail.  We rediscussed risks including bleeding, infection, damage to kidney / ureter  bladder, rarely loss of kidney. We rediscussed anesthetic risks and rare but serious surgical complications including DVT, PE, MI, and mortality. We specifically readdressed that in 5-10% of cases a staged approach is required with stenting followed by re-attempt ureteroscopy if anatomy unfavorable. The patient  voiced understanding and wises to proceed.   I specifically mentioned that pt will have ureteral stents replaced as is bilateral procedure.  Bryceson Grape 08/29/2013, 6:04 AM

## 2013-08-29 NOTE — Anesthesia Preprocedure Evaluation (Signed)

## 2013-08-30 ENCOUNTER — Encounter (HOSPITAL_BASED_OUTPATIENT_CLINIC_OR_DEPARTMENT_OTHER): Payer: Self-pay | Admitting: Urology

## 2013-08-31 NOTE — Op Note (Signed)
[name] Page              ACCOUNT NO.:  [id number]  MEDICAL RECORD NO.:  [id number]  LOCATION:                                 FACILITY:  PHYSICIAN:  Sebastian Ache, MD     DATE OF BIRTH:  1950/10/19  DATE OF PROCEDURE:  08/29/2013 DATE OF DISCHARGE:                              OPERATIVE REPORT   DIAGNOSIS:  Right ureteral and bilateral renal stones.  PROCEDURE: 1. Cystoscopy, bilateral retrograde pyelogram interpretation. 2. Exchange of bilateral ureteral stents, 6 x 24s, no tether. 3. Bilateral ureteroscopy with laser lithotripsy.  FINDINGS: 1. Small volume left intrarenal stones. 2. Right distal ureteral stone. 3. Large volume right intrarenal stones. 4. Unremarkable retrograde pyelograms other than large filling defect     in the right kidney consistent with known stone.  SPECIMENS:  Right and left urolithiasis for compositional analysis.  INDICATION:  [name] is a pleasant 62 year old lady with prior history of recurrent nephrolithiasis.  She presented late last month with acute right renal colic and refractory nausea and vomiting.  She has equivocal infectious parameters at that time, therefore she underwent temporizing with bilateral ureteral stenting.  Fortunately, her final cultures turned out negative.  Definitive options were discussed including shockwave lithotripsy versus ureteroscopy, addressing right side only versus bilateral stones, and she wished to proceed with bilateral ureteroscopy with a goal of stone free.  Informed consent was obtained and placed in the medical record.  PROCEDURE IN DETAIL:  The patient being, [name] Page, and the procedure being bilateral ureteroscopic stone manipulation was confirmed.  Procedure was carried out.  Time-out was performed. Intravenous antibiotics administered.  General LMA anesthesia was introduced.  Patient placed into a low lithotomy position.  A sterile field was created by prepping and draping  the patient's vagina, introitus, and proximal thighs using iodine x3.  Next, cystourethroscopy was performed using 22-French rigid cystoscope with 12-degree offset lens.  Inspection of the urinary bladder revealed no diverticula, calcifications, papillary lesions, distal end of bilateral stents were in situ.  The distal end of the left stent was grasped proximal, brought to the level of the urethral meatus through which a 0.03 glidewire was advanced to the level of the upper pole.  This was exchanged for an open-ended catheter and left retrograde pyelogram was obtained.  Left retrograde pyelogram demonstrated a single left ureter, single system left kidney.  No filling defects or narrowing noted.  The Glidewire was reintroduced, set aside as a safety wire.  Next, a semi- rigid ureteroscopy was performed in the entire length of left ureter using a 6-French semi-rigid ureteroscope alongside a separate Sensor working wire with an 8-French feeding tube in the urinary bladder for pressure release.  This revealed no mucosal abnormalities, calcifications in the left ureter.  The semi-rigid scope was exchanged with a 12/14, 24-cm ureteral access sheath under continuous fluoroscopic vision of the proximal ureter.  Next, flexible digital ureteroscopy performed of the proximal left ureter and entire left kidney.  Several small intrarenal papillary tip calcifications were encountered.  They were grasped with an escape basket and brought out in their entirety, set aside for compositional analysis.  The sheath was then removed under continuous  ureteroscopic vision.  No mucosal abnormalities were found. Finally, a new 6 x 24 stent was reintroduced over the remaining safety wire.  Good proximal and distal curl were noted.  Attention was then directed at the right side.  The distal end of the right stent was brought to the level of the urethral meatus using cystoscopic graspers. A 0.03 glidewire was  advanced at the level of the upper pole.  The stent was exchanged for a 6-French end-hole catheter and right retrograde pyelogram was obtained.  Right retrograde pyelogram demonstrated a single right ureter, single system right kidney.  There was hydroureteronephrosis to the distal ureter.  There was a large filling defect in the right kidney system consistent with large intrarenal stone.  A 0.038 Glidewire was then advanced again, set aside as safety wire.  Next, semi-rigid ureteroscopy was performed of the entire length of the right ureter alongside a separate Sensor working wire.  A distal ureteral stone was encountered. This appeared to be amenable to basketing as such the escape basket was used to grasp the stone on its long axis and it was removed and set aside for compositional analysis.  There were no additional calcifications or mucosal abnormalities of the right ureter.  Next, the semi-rigid ureteroscope was exchanged for the 12/14, 24 cm ureteral access sheath over the Sensor working wire, and flexible digital ureteroscopy was performed of the proximal ureter and right kidney. Indeed, a large volume right intrarenal stone was found.  This appeared to be much too large for basketing, as such holmium laser energy applied to the stone using settings of 0.5 joules and 20 Hz, fragmenting the stone into fragments 2-3 mm in size.  An Escape Basket was then used to grasp these fragments and they were removed in their entirety.  This technique was used such that all fragments larger than 1/3rd mm were removed.  Given the large nature of the stone, there was considerable amount of small fragments mostly in the upper pole.  Then, a blood mop technique was used in which 3 mL of autologous blood was injected via the ureteroscope in the upper pole, allowed to sit for 3 minutes and clot, and this clot was sequentially removed.  This removed approximately 75% of these very small residual  fragments.  Final inspection revealed complete resolution of all fragments larger than 1/3rd mm.  Excellent hemostasis.  There was no evidence of perforation. The access sheath was removed under continuous ureteroscopic vision.  No mucosal abnormalities were found.  Finally, a new right-sided 6 x 24 stent was placed with the remaining safety wire using fluoroscopic guidance.  Good proximal and distal curl were noted.  The bladder was emptied per cystoscope.  Procedure was then terminated.  The patient tolerated the procedure well.  There were no immediate periprocedural complications.  The patient was taken to the postanesthesia care unit in stable condition.          ______________________________ Sebastian Ache, MD     TM/MEDQ  D:  08/29/2013  T:  08/30/2013  Job:  [id number]

## 2014-07-04 ENCOUNTER — Emergency Department (HOSPITAL_COMMUNITY): Payer: No Typology Code available for payment source

## 2014-07-04 ENCOUNTER — Emergency Department (HOSPITAL_COMMUNITY)
Admission: EM | Admit: 2014-07-04 | Discharge: 2014-07-04 | Disposition: A | Discharge status: Home / Self Care | Payer: No Typology Code available for payment source | Attending: Emergency Medicine | Admitting: Emergency Medicine

## 2014-07-04 ENCOUNTER — Encounter (HOSPITAL_COMMUNITY): Payer: Self-pay | Admitting: Emergency Medicine

## 2014-07-04 DIAGNOSIS — K8020 Calculus of gallbladder without cholecystitis without obstruction: Secondary | ICD-10-CM | POA: Diagnosis not present

## 2014-07-04 DIAGNOSIS — Z79899 Other long term (current) drug therapy: Secondary | ICD-10-CM | POA: Diagnosis not present

## 2014-07-04 DIAGNOSIS — N202 Calculus of kidney with calculus of ureter: Secondary | ICD-10-CM | POA: Diagnosis not present

## 2014-07-04 DIAGNOSIS — M810 Age-related osteoporosis without current pathological fracture: Secondary | ICD-10-CM | POA: Insufficient documentation

## 2014-07-04 DIAGNOSIS — M1990 Unspecified osteoarthritis, unspecified site: Secondary | ICD-10-CM | POA: Insufficient documentation

## 2014-07-04 DIAGNOSIS — N201 Calculus of ureter: Secondary | ICD-10-CM

## 2014-07-04 DIAGNOSIS — Z85828 Personal history of other malignant neoplasm of skin: Secondary | ICD-10-CM | POA: Diagnosis not present

## 2014-07-04 DIAGNOSIS — R109 Unspecified abdominal pain: Secondary | ICD-10-CM | POA: Diagnosis present

## 2014-07-04 DIAGNOSIS — N200 Calculus of kidney: Secondary | ICD-10-CM

## 2014-07-04 HISTORY — DX: Malignant (primary) neoplasm, unspecified: C80.1

## 2014-07-04 LAB — CBC WITH DIFFERENTIAL/PLATELET
Basophils Absolute: 0 10*3/uL (ref 0.0–0.1)
Basophils Relative: 0 % (ref 0–1)
EOS PCT: 0 % (ref 0–5)
Eosinophils Absolute: 0 10*3/uL (ref 0.0–0.7)
HEMATOCRIT: 38.9 % (ref 36.0–46.0)
HEMOGLOBIN: 12 g/dL (ref 12.0–15.0)
LYMPHS ABS: 1.1 10*3/uL (ref 0.7–4.0)
Lymphocytes Relative: 11 % — ABNORMAL LOW (ref 12–46)
MCH: 26.3 pg (ref 26.0–34.0)
MCHC: 30.8 g/dL (ref 30.0–36.0)
MCV: 85.3 fL (ref 78.0–100.0)
MONO ABS: 0.6 10*3/uL (ref 0.1–1.0)
Monocytes Relative: 5 % (ref 3–12)
Neutro Abs: 8.8 10*3/uL — ABNORMAL HIGH (ref 1.7–7.7)
Neutrophils Relative %: 84 % — ABNORMAL HIGH (ref 43–77)
Platelets: 184 10*3/uL (ref 150–400)
RBC: 4.56 MIL/uL (ref 3.87–5.11)
RDW: 13.5 % (ref 11.5–15.5)
WBC: 10.5 10*3/uL (ref 4.0–10.5)

## 2014-07-04 LAB — BASIC METABOLIC PANEL
Anion gap: 15 (ref 5–15)
BUN: 25 mg/dL — ABNORMAL HIGH (ref 6–23)
CO2: 24 meq/L (ref 19–32)
Calcium: 9.4 mg/dL (ref 8.4–10.5)
Chloride: 103 mEq/L (ref 96–112)
Creatinine, Ser: 0.99 mg/dL (ref 0.50–1.10)
GFR calc Af Amer: 68 mL/min — ABNORMAL LOW (ref >90)
GFR calc non Af Amer: 59 mL/min — ABNORMAL LOW (ref >90)
GLUCOSE: 148 mg/dL — AB (ref 70–99)
Potassium: 3.8 mEq/L (ref 3.7–5.3)
Sodium: 142 mEq/L (ref 137–147)

## 2014-07-04 LAB — URINALYSIS, ROUTINE W REFLEX MICROSCOPIC
Bilirubin Urine: NEGATIVE
GLUCOSE, UA: NEGATIVE mg/dL
KETONES UR: NEGATIVE mg/dL
LEUKOCYTES UA: NEGATIVE
Nitrite: NEGATIVE
PH: 5.5 (ref 5.0–8.0)
Protein, ur: 30 mg/dL — AB
Specific Gravity, Urine: 1.022 (ref 1.005–1.030)
Urobilinogen, UA: 0.2 mg/dL (ref 0.0–1.0)

## 2014-07-04 LAB — URINE MICROSCOPIC-ADD ON

## 2014-07-04 IMAGING — CT CT ABD-PELV W/O CM
1 series · 12 of 14 positions shown, 18 images · non-contrast
Comparison: [DATE]

CLINICAL DATA: Right flank pain and microhematuria. Initial
encounter.

EXAM:
CT ABDOMEN AND PELVIS WITHOUT CONTRAST
TECHNIQUE: Multidetector CT imaging of the abdomen and pelvis was performed
following the standard protocol without IV contrast.

[Series 3: lung · axial · 0.61mm/px · z∈[+1424,+1480]mm · 12 of 14 slices shown, 18 images]
[im 2/14  soft-tissue]
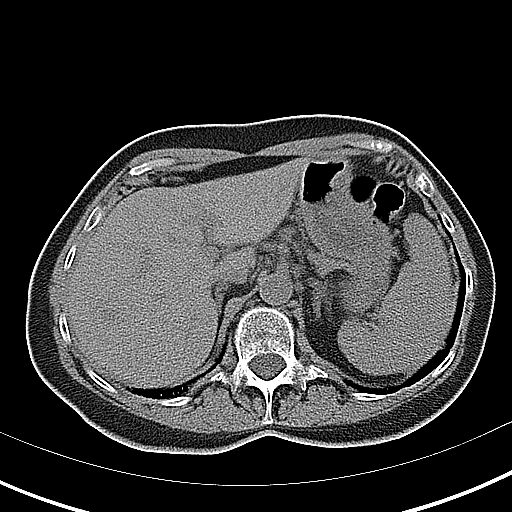
[im 2/14  bone]
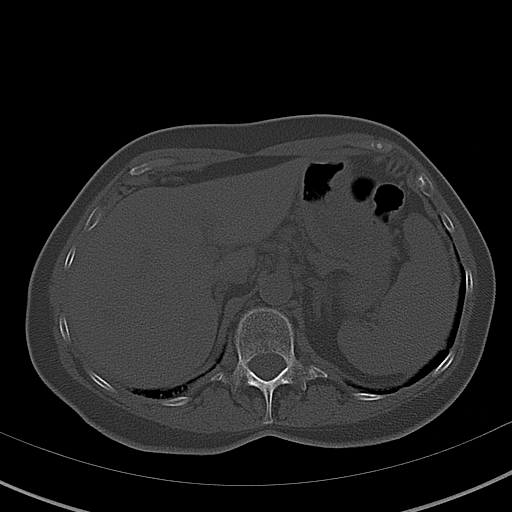
[im 3/14  soft-tissue]
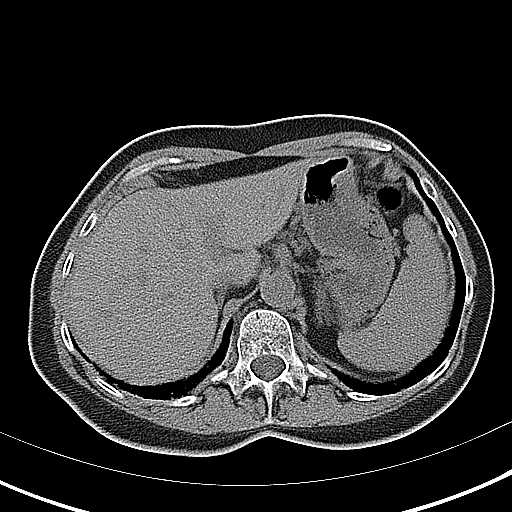
[im 4/14  soft-tissue]
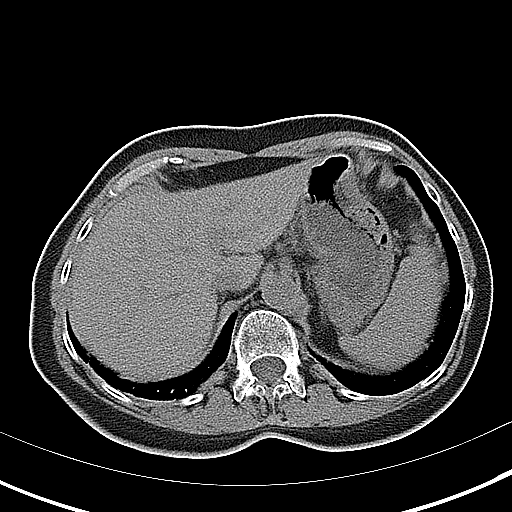
[im 5/14  soft-tissue]
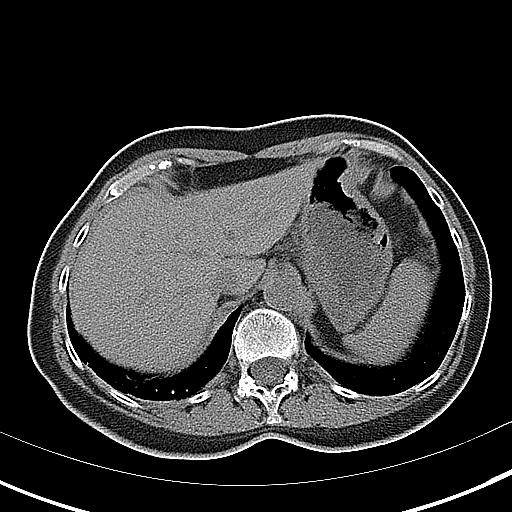
[im 6/14  soft-tissue]
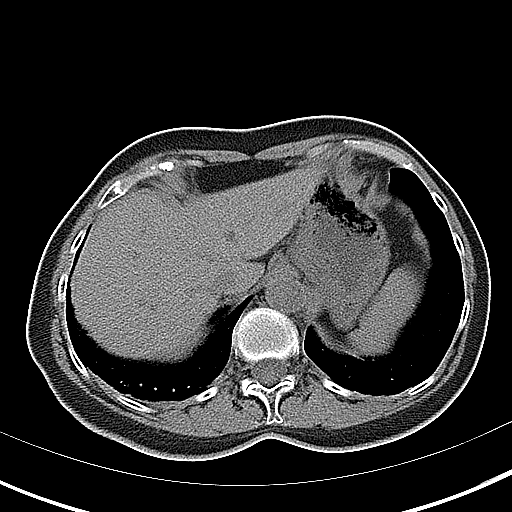
[im 7/14  soft-tissue]
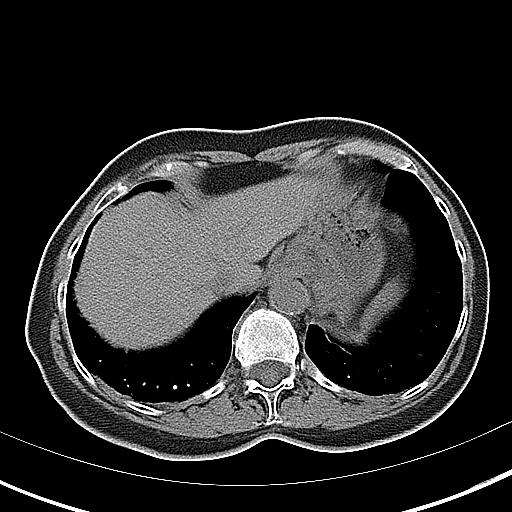
[im 8/14  soft-tissue]
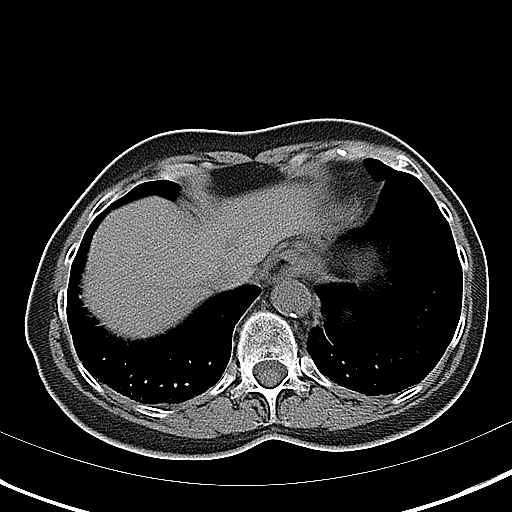
[im 9/14  soft-tissue]
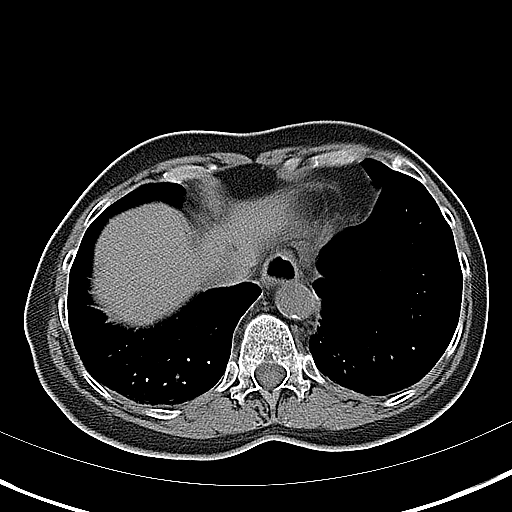
[im 10/14  soft-tissue]
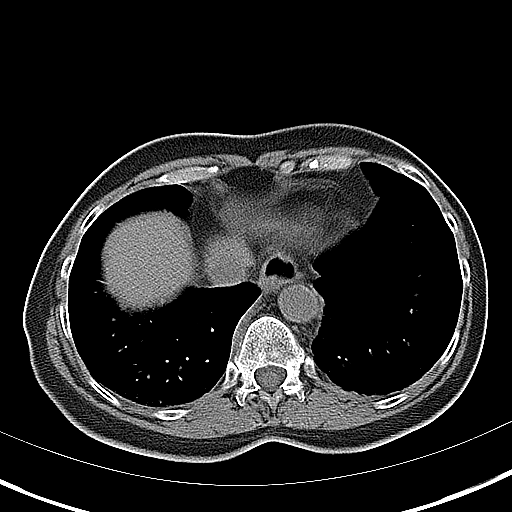
[im 10/14  lung]
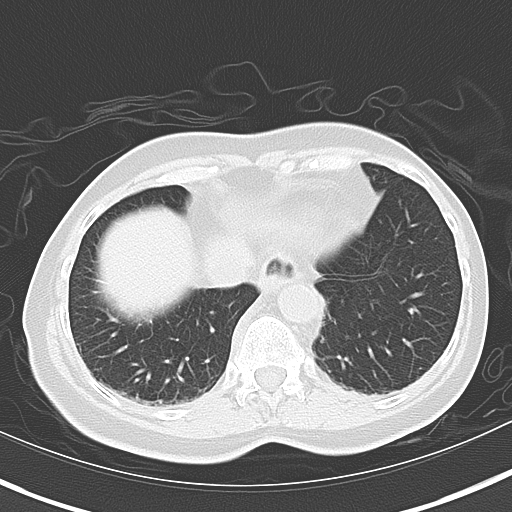
[im 10/14  bone]
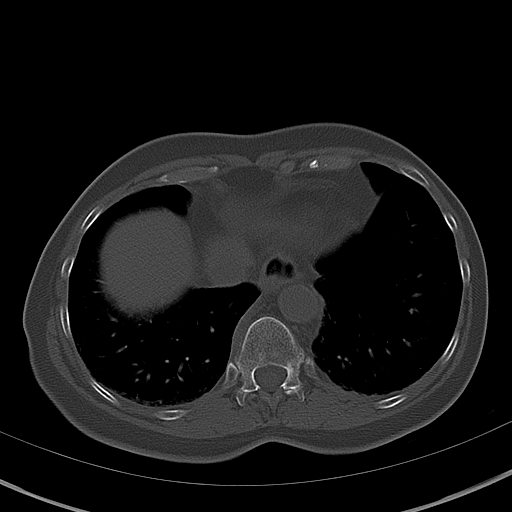
[im 11/14  soft-tissue]
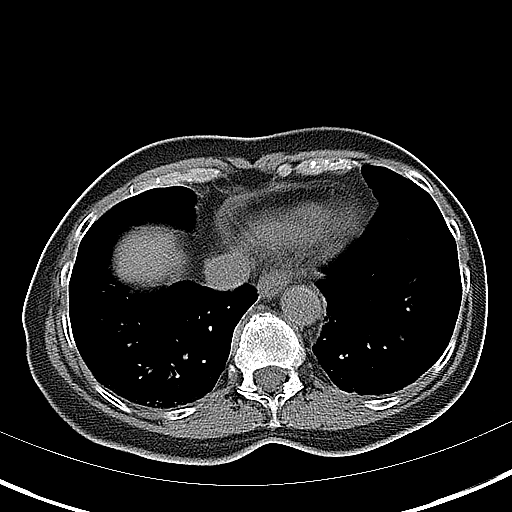
[im 11/14  lung]
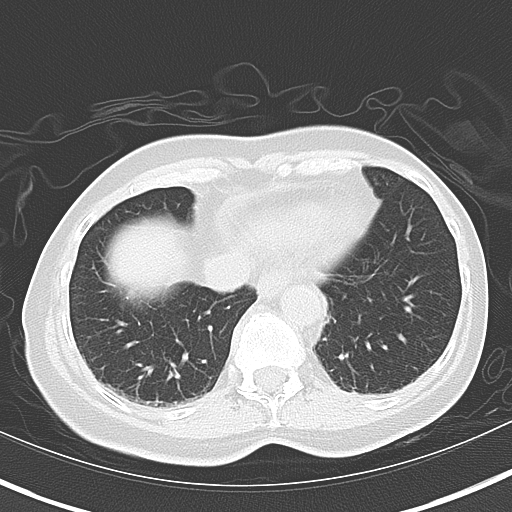
[im 12/14  soft-tissue]
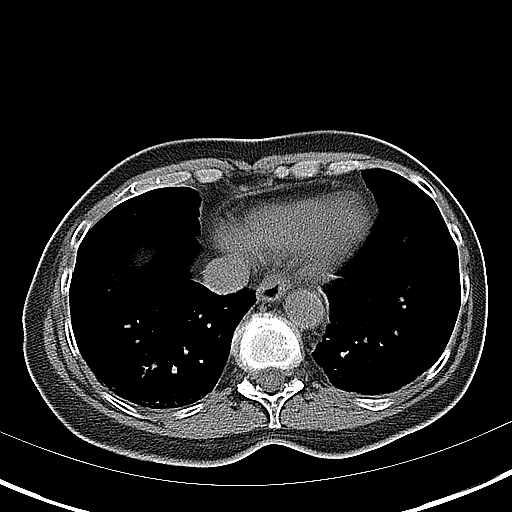
[im 12/14  lung]
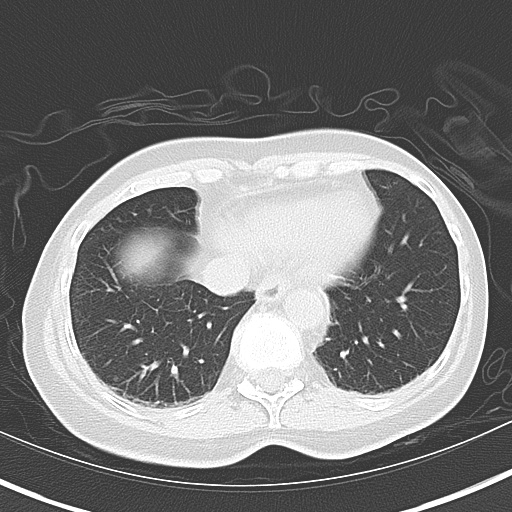
[im 13/14  soft-tissue]
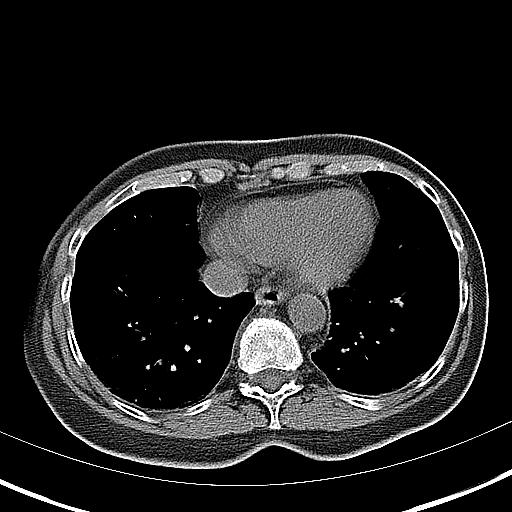
[im 13/14  lung]
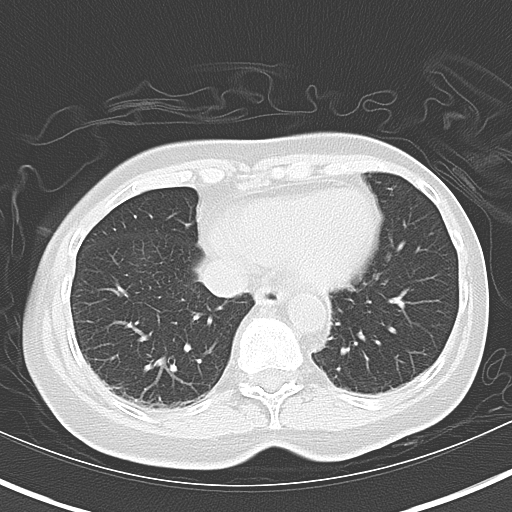

[12 of 14 positions shown; findings below may reference images not displayed]

FINDINGS: BODY WALL: Negative.

LOWER CHEST: Negative.

ABDOMEN/PELVIS:

Liver: Stable 1.2 cm cyst in the low left lobe.

Biliary: Cholelithiasis.  No evidence of acute cholecystitis.

Pancreas: Unremarkable.

Spleen: Unremarkable.

Adrenals: Unremarkable.

Kidneys and ureters: Marked right hydroureteronephrosis secondary to
a 7 by 4 mm stone in the distal right ureter. Right renal expansion
and low-density is related to obstruction as well. Punctate calculi
present on the right, measuring no more than 2 mm. There is a 4 mm
stone in the left kidney. No left-sided hydronephrosis or ureteral
calculus.

Bladder: Unremarkable.

Reproductive: Unremarkable.

Bowel: No obstruction.  Few sigmoid diverticula.  Normal appendix.

Retroperitoneum: No mass or adenopathy.

Peritoneum: No ascites or pneumoperitoneum.

Vascular: No acute abnormality.

OSSEOUS: No acute abnormalities.
IMPRESSION: 1. Obstructing 7 x 4 mm stone in the distal right ureter.
2. Bilateral nephrolithiasis.
3. Cholelithiasis.

## 2014-07-04 MED ORDER — KETOROLAC TROMETHAMINE 30 MG/ML IJ SOLN
30.0000 mg | Freq: Once | INTRAMUSCULAR | Status: AC
  Administered 2014-07-04: 30 mg via INTRAVENOUS
  Filled 2014-07-04: qty 1

## 2014-07-04 MED ORDER — ONDANSETRON HCL 4 MG PO TABS: 4.0000 mg | ORAL_TABLET | Freq: Four times a day (QID) | ORAL | Status: DC

## 2014-07-04 MED ORDER — SODIUM CHLORIDE 0.9 % IV BOLUS (SEPSIS)
1000.0000 mL | Freq: Once | INTRAVENOUS | Status: AC
  Administered 2014-07-04: 1000 mL via INTRAVENOUS

## 2014-07-04 MED ORDER — HYDROCODONE-ACETAMINOPHEN 5-325 MG PO TABS: 2.0000 | ORAL_TABLET | ORAL | Status: DC | PRN

## 2014-07-04 MED ORDER — ONDANSETRON HCL 4 MG/2ML IJ SOLN
4.0000 mg | Freq: Once | INTRAMUSCULAR | Status: AC
  Administered 2014-07-04: 4 mg via INTRAVENOUS
  Filled 2014-07-04: qty 2

## 2014-07-04 NOTE — ED Notes (Addendum)
Patient presents c/o 1 day Hx of nausea/vomiting and R sided flank pain, chills. Denies lightheadedness/dizziness.

## 2014-07-04 NOTE — Discharge Instructions (Signed)
Return to the emergency room with worsening of symptoms, new symptoms or with symptoms that are concerning, especially fevers, nausea, vomiting, unable to keep fluids down, severe pain, unable to urinate, abdominal pain or you feel faint.  Hydrocodone for severe pain. Do not operate machinery, drive or drink alcohol while taking narcotics or muscle relaxers. zofran as needed for nausea.  Call today for a follow up appointment with urology in 2 days. See number above.

## 2014-07-04 NOTE — ED Notes (Signed)
Pt states that she began having rt sided flank pain around 2300 tonight; pt states that she has had kidney stones in the past ans this feels the same; pt c/o vomit x 1; pt reports that she only has urinated small amounts this pm

## 2014-07-04 NOTE — ED Provider Notes (Signed)
Medical screening examination/treatment/procedure(s) were performed by non-physician practitioner and as supervising physician I was immediately available for consultation/collaboration.   EKG Interpretation None        Debby Freiberg, MD 07/04/14 2244

## 2014-07-04 NOTE — ED Provider Notes (Signed)
[name] Page is a 63 y.o. female with PMH of nephrolithiasis presenting with right sided flank pain with nausea and emesis. No fevers. Pt states it feels like prior kidney stones. Pain well controlled with IV fluids, toradol and zofran.  Pt sign out patient from [facility], PA-C at 6:30 am. Awaiting results of Ct scan.   Physical Exam  BP 134/68  Pulse 86  Temp(Src) 98.6 F (37 C) (Oral)  Resp 20  Ht 5\' 2"  (1.575 m)  Wt 125 lb (56.7 kg)  BMI 22.86 kg/m2  SpO2 98%  Physical Exam  Nursing note and vitals reviewed. Constitutional: She appears well-developed and well-nourished. No distress.  HENT:  Head: Normocephalic and atraumatic.  Eyes: Conjunctivae and EOM are normal. Right eye exhibits no discharge. Left eye exhibits no discharge.  Cardiovascular: Normal rate and regular rhythm.   Pulmonary/Chest: No respiratory distress.  Abdominal: Soft. Bowel sounds are normal. She exhibits no distension. There is no tenderness.  Right CVA tenderness  Neurological: She is alert. She exhibits normal muscle tone. Coordination normal.  Skin: Skin is warm and dry. She is not diaphoretic.    ED Course  Procedures  MDM History of renal calculus R obstructing Ureterolithiasis  R hydroureteronephrosis Bilateral nephrolithiasis Cholelithiasis  Pt has been diagnosed with a Kidney Stone via CT. Stone is in right ureter and is obstructing causing marked hydronephrosis. VSS. Patient GFR 59, Cr 0.99, BUN 25. Patient without fevers, urine not infected and patient reported 1/10 pain and refused pain meds. No intractable vomiting. Consult to urology. Spoke with Dr. Risa Grill who recommended close outpatient follow up in the office. Pt instructed to call today for appointment. Pt will be dc home with pain medications. Driving and sedating precautions provided. Pt to return with fevers, abdominal pain, unable to make urine, intractable vomiting or severe pain.  Discussed return precautions with  patient. Discussed all results and patient verbalizes understanding and agrees with plan.       Pura Spice, PA-C 07/04/14 [phone]

## 2014-07-04 NOTE — ED Provider Notes (Signed)
CSN: [id number]     Arrival date & time 07/04/14  0344 History   First MD Initiated Contact with Patient 07/04/14 0403     Chief Complaint  Patient presents with  . Flank Pain     (Consider location/radiation/quality/duration/timing/severity/associated sxs/prior Treatment) HPI Comments: Patient is a 63 year old female who presents with flank pain that started earlier this evening. The pain is located in her right flank and radiates around to her right abdomen. The pain is described as aching and severe. The pain started gradually and progressively worsened since the onset. No alleviating/aggravating factors. The patient has tried nothing for symptoms without relief. Associated symptoms include nausea and vomiting. Patient denies fever, headache, diarrhea, chest pain, SOB, dysuria, constipation. Patient reports this feels like her previous kidney stones.   Patient is a 63 y.o. female presenting with flank pain.  Flank Pain Associated symptoms include nausea and vomiting. Pertinent negatives include no abdominal pain, arthralgias, chest pain, chills, fatigue, fever, neck pain or weakness.    Past Medical History  Diagnosis Date  . Osteoporosis   . Renal calculus, bilateral   . Frequency of urination   . Urgency of urination   . Hematuria   . History of kidney stones   . Dysuria   . Arthritis   . Wears glasses   . Cancer     melanoma   Past Surgical History  Procedure Laterality Date  . Cystoscopy with retrograde pyelogram, ureteroscopy and stent placement Bilateral 08/11/2013    Procedure: [place], URETEROSCOPY AND STENT PLACEMENT;  Surgeon: Alexis Frock, MD;  Location: [facility];  Service: Urology;  Laterality: Bilateral;  . Hemiarthroplasty shoulder fracture Left 1992  . Cystoscopy with ureteroscopy and stent placement Bilateral 08/29/2013    Procedure: CYSTOSCOPY WITH BILATERAL URETEROSCOPY AND STENT REPLACEMENTS, stone extraction;  Surgeon: Alexis Frock, MD;  Location: [facility];  Service: Urology;  Laterality: Bilateral;  . Holmium laser application Bilateral 17/40/8144    Procedure: HOLMIUM LASER APPLICATION;  Surgeon: Alexis Frock, MD;  Location: [facility];  Service: Urology;  Laterality: Bilateral;   Family History  Problem Relation Age of Onset  . Cancer Mother   . Atrial fibrillation Father   . Hyperlipidemia Father   . Hypertension Father   . Hypertension Sister   . Hyperlipidemia Sister   . Thyroid disease Sister    History  Substance Use Topics  . Smoking status: Never Smoker   . Smokeless tobacco: Never Used  . Alcohol Use: No   OB History   Grav Para Term Preterm Abortions TAB SAB Ect Mult Living                 Review of Systems  Constitutional: Negative for fever, chills and fatigue.  HENT: Negative for trouble swallowing.   Eyes: Negative for visual disturbance.  Respiratory: Negative for shortness of breath.   Cardiovascular: Negative for chest pain and palpitations.  Gastrointestinal: Positive for nausea and vomiting. Negative for abdominal pain and diarrhea.  Genitourinary: Positive for flank pain. Negative for dysuria and difficulty urinating.  Musculoskeletal: Negative for arthralgias and neck pain.  Skin: Negative for color change.  Neurological: Negative for dizziness and weakness.  Psychiatric/Behavioral: Negative for dysphoric mood.      Allergies  Sulfa antibiotics  Home Medications   Prior to Admission medications   Medication Sig Start Date End Date Taking? Authorizing Provider  alendronate (FOSAMAX) 70 MG tablet Take 70 mg by mouth every 7 (  seven) days. Take with a full glass of water on an empty stomach.   Yes Historical Provider, MD  HYDROcodone-acetaminophen (NORCO/VICODIN) 5-325 MG per tablet Take 1 tablet by mouth every 6 (six) hours as needed for moderate pain.   Yes Historical Provider, MD  ibuprofen (ADVIL,MOTRIN) 200 MG tablet Take 200-400  mg by mouth every 6 (six) hours as needed for moderate pain.   Yes Historical Provider, MD  Multiple Vitamin (MULTIVITAMIN WITH MINERALS) TABS tablet Take 1 tablet by mouth daily.   Yes Historical Provider, MD  potassium citrate (UROCIT-K) 10 MEQ (1080 MG) SR tablet Take 10 mEq by mouth 2 (two) times daily.    Yes Historical Provider, MD   BP 152/74  Pulse 62  Temp(Src) 98.6 F (37 C) (Oral)  Resp 18  Ht 5\' 2"  (1.575 m)  Wt 125 lb (56.7 kg)  BMI 22.86 kg/m2  SpO2 98% Physical Exam  Nursing note and vitals reviewed. Constitutional: She is oriented to person, place, and time. She appears well-developed and well-nourished. No distress.  HENT:  Head: Normocephalic and atraumatic.  Eyes: Conjunctivae and EOM are normal.  Neck: Normal range of motion.  Cardiovascular: Normal rate and regular rhythm.  Exam reveals no gallop and no friction rub.   No murmur heard. Pulmonary/Chest: Effort normal and breath sounds normal. She has no wheezes. She has no rales. She exhibits no tenderness.  Abdominal: Soft. She exhibits no distension. There is tenderness. There is no rebound and no guarding.  Right side abdominal tenderness to palpation. No focal tenderness to palpation or peritoneal signs.   Genitourinary:  Right CVA tenderness.   Musculoskeletal: Normal range of motion.  Neurological: She is alert and oriented to person, place, and time. Coordination normal.  Speech is goal-oriented. Moves limbs without ataxia.   Skin: Skin is warm and dry.  Psychiatric: She has a normal mood and affect. Her behavior is normal.    ED Course  Procedures (including critical care time) Labs Review Labs Reviewed  CBC WITH DIFFERENTIAL - Abnormal; Notable for the following:    Neutrophils Relative % 84 (*)    Neutro Abs 8.8 (*)    Lymphocytes Relative 11 (*)    All other components within normal limits  BASIC METABOLIC PANEL - Abnormal; Notable for the following:    Glucose, Bld 148 (*)    BUN 25 (*)     GFR calc non Af Amer 59 (*)    GFR calc Af Amer 68 (*)    All other components within normal limits  URINALYSIS, ROUTINE W REFLEX MICROSCOPIC - Abnormal; Notable for the following:    APPearance CLOUDY (*)    Hgb urine dipstick LARGE (*)    Protein, ur 30 (*)    All other components within normal limits  URINE MICROSCOPIC-ADD ON - Abnormal; Notable for the following:    Crystals CA OXALATE CRYSTALS (*)    All other components within normal limits    Imaging Review Ct Abdomen Pelvis Wo Contrast  07/04/2014   CLINICAL DATA:  Right flank pain and microhematuria. Initial encounter.  EXAM: CT ABDOMEN AND PELVIS WITHOUT CONTRAST  TECHNIQUE: Multidetector CT imaging of the abdomen and pelvis was performed following the standard protocol without IV contrast.  COMPARISON:  08/10/2013  FINDINGS: BODY WALL: Negative.  LOWER CHEST: Negative.  ABDOMEN/PELVIS:  Liver: Stable 1.2 cm cyst in the low left lobe.  Biliary: Cholelithiasis.  No evidence of acute cholecystitis.  Pancreas: Unremarkable.  Spleen: Unremarkable.  Adrenals: Unremarkable.  Kidneys and ureters: Marked right hydroureteronephrosis secondary to a 7 by 4 mm stone in the distal right ureter. Right renal expansion and low-density is related to obstruction as well. Punctate calculi present on the right, measuring no more than 2 mm. There is a 4 mm stone in the left kidney. No left-sided hydronephrosis or ureteral calculus.  Bladder: Unremarkable.  Reproductive: Unremarkable.  Bowel: No obstruction.  Few sigmoid diverticula.  Normal appendix.  Retroperitoneum: No mass or adenopathy.  Peritoneum: No ascites or pneumoperitoneum.  Vascular: No acute abnormality.  OSSEOUS: No acute abnormalities.  IMPRESSION: 1. Obstructing 7 x 4 mm stone in the distal right ureter. 2. Bilateral nephrolithiasis. 3. Cholelithiasis.   Electronically Signed   By: Jorje Guild M.D.   On: 07/04/2014 06:33     EKG Interpretation None      MDM   Final diagnoses:   Cholelithiasis without cholecystitis  Nephrolithiasis  Ureterolithiasis    5:59 AM Labs unremarkable for acute changes. Urinalysis shows hemoglobin and calcium oxalate crystals. Vitals stable and patient afebrile. Patient given toradol for pain. CT abdomen pelvis pending.   Patient signed out to Juluis Mire, PA-C pending CT results.    Alvina Chou, PA-C 07/04/14 2206

## 2014-07-05 ENCOUNTER — Other Ambulatory Visit: Payer: Self-pay | Admitting: Urology

## 2014-07-05 ENCOUNTER — Encounter (HOSPITAL_COMMUNITY): Payer: Self-pay | Admitting: *Deleted

## 2014-07-08 ENCOUNTER — Encounter (HOSPITAL_COMMUNITY): Payer: Self-pay | Admitting: General Practice

## 2014-07-08 ENCOUNTER — Ambulatory Visit (HOSPITAL_COMMUNITY): Payer: No Typology Code available for payment source

## 2014-07-08 ENCOUNTER — Encounter (HOSPITAL_COMMUNITY)
Admission: RE | Disposition: A | Discharge status: Home / Self Care | Payer: Self-pay | Source: Ambulatory Visit | Attending: Urology

## 2014-07-08 ENCOUNTER — Ambulatory Visit (HOSPITAL_COMMUNITY)
Admission: RE | Admit: 2014-07-08 | Discharge: 2014-07-08 | Disposition: A | Discharge status: Home / Self Care | Payer: No Typology Code available for payment source | Source: Ambulatory Visit | Attending: Urology | Admitting: Urology

## 2014-07-08 DIAGNOSIS — Z882 Allergy status to sulfonamides status: Secondary | ICD-10-CM | POA: Insufficient documentation

## 2014-07-08 DIAGNOSIS — M810 Age-related osteoporosis without current pathological fracture: Secondary | ICD-10-CM | POA: Diagnosis not present

## 2014-07-08 DIAGNOSIS — Z85820 Personal history of malignant melanoma of skin: Secondary | ICD-10-CM | POA: Insufficient documentation

## 2014-07-08 DIAGNOSIS — N200 Calculus of kidney: Secondary | ICD-10-CM | POA: Insufficient documentation

## 2014-07-08 DIAGNOSIS — N281 Cyst of kidney, acquired: Secondary | ICD-10-CM | POA: Diagnosis not present

## 2014-07-08 LAB — CREATININE, SERUM
Creatinine, Ser: 1.32 mg/dL — ABNORMAL HIGH (ref 0.50–1.10)
GFR calc Af Amer: 48 mL/min — ABNORMAL LOW (ref >90)
GFR, EST NON AFRICAN AMERICAN: 42 mL/min — AB (ref >90)

## 2014-07-08 IMAGING — CR DG ABDOMEN 1V
1 series · 1 of 1 positions shown · non-contrast
Comparison: [DATE].  CT, [DATE].

CLINICAL DATA: Pre lithotripsy for right-sided stones

EXAM:
ABDOMEN - 1 VIEW

[t abdomen supine]
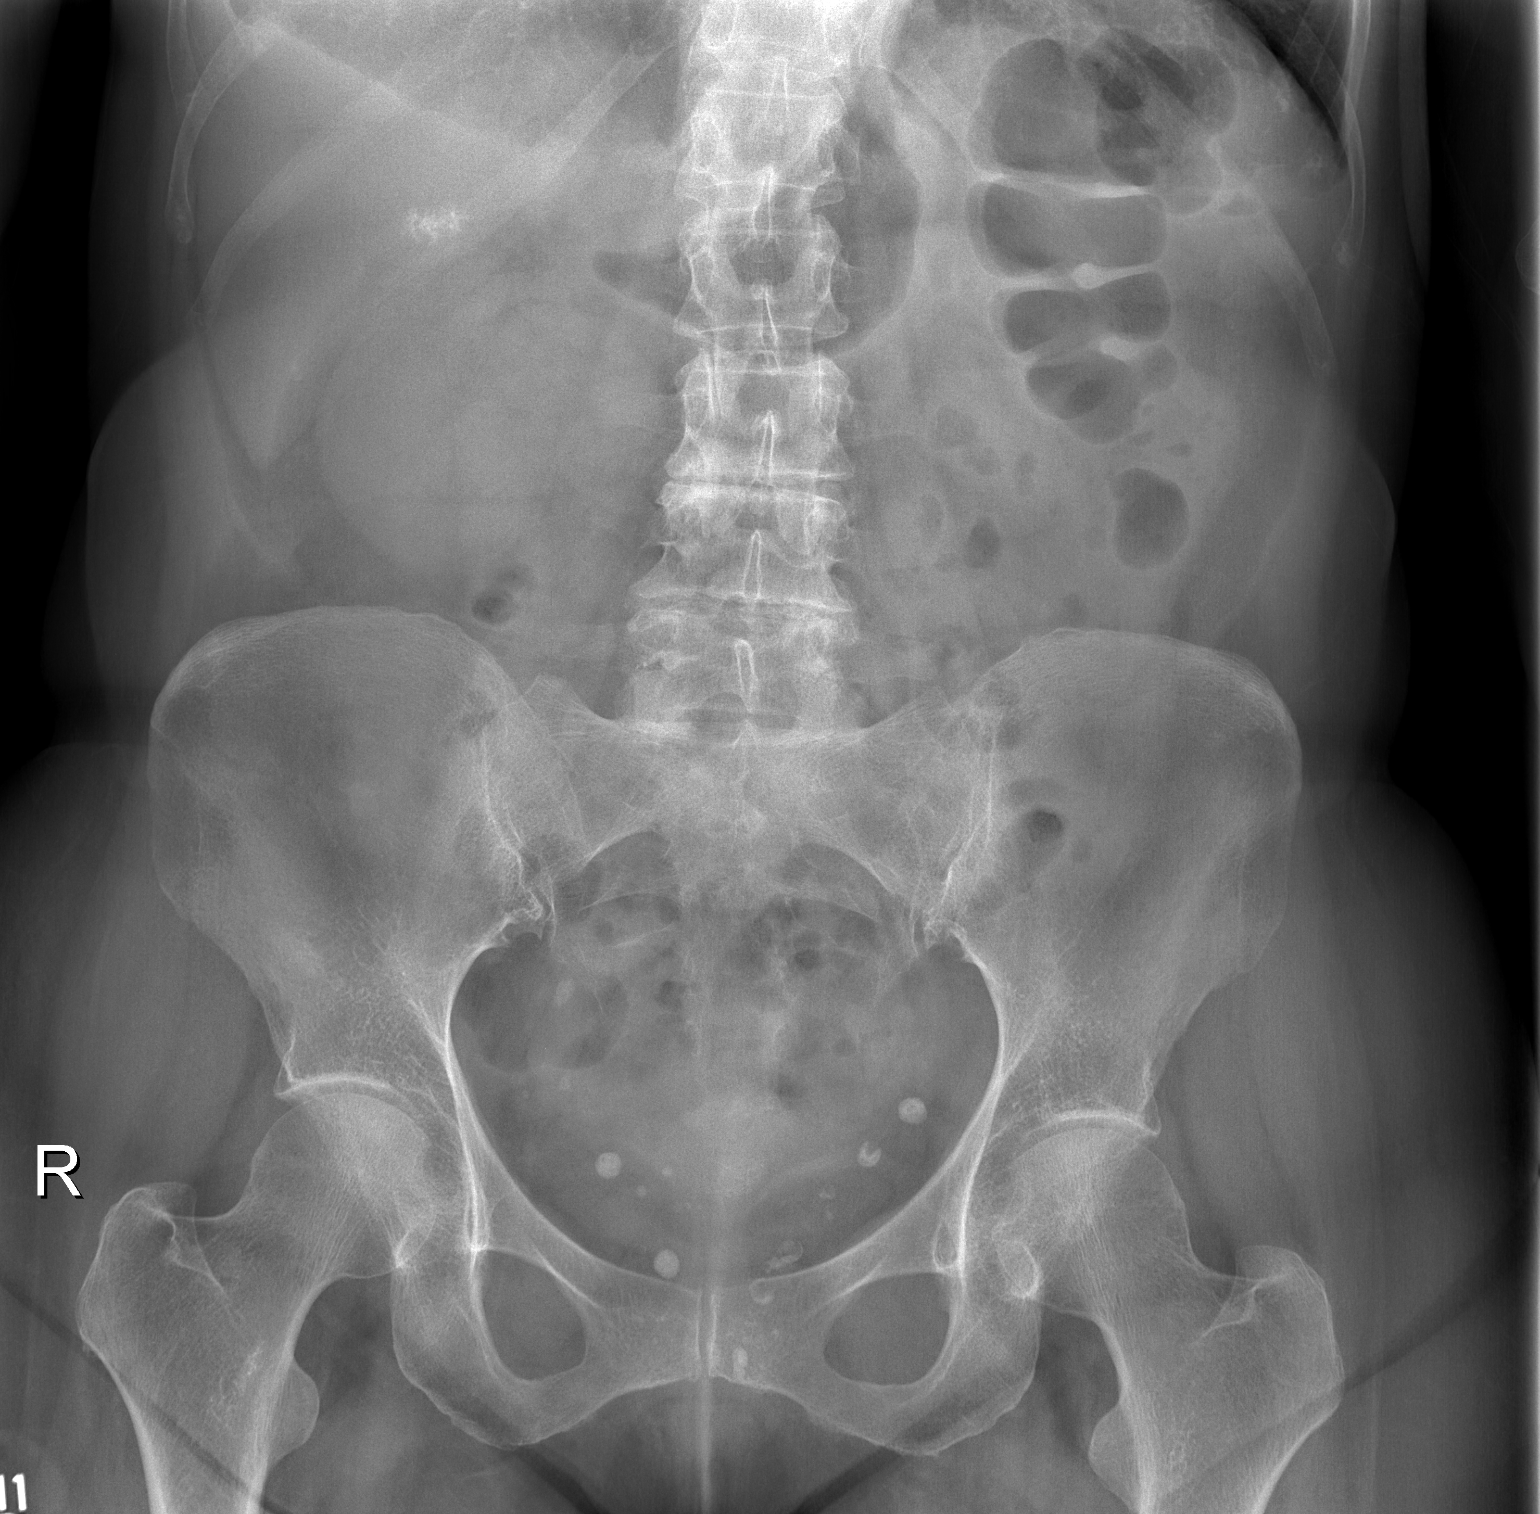

[1 of 1 positions shown; findings below may reference images not displayed]

FINDINGS: 7.5 mm stone projects in the right pelvis consistent with the distal
ureteral stone noted on the prior CT. Smaller distal right ureteral
stone is also noted below this consistent with the additional stone
noted on CT.

No convincing intrarenal stones.  No other ureteral stones.

Stable irregular gallstones.  No other soft tissue abnormality.

Normal bowel gas pattern. Degenerative changes are noted of the
lumbar spine.
IMPRESSION: 1. Distal right ureteral stones as described, which appear stable in
position from the recent prior CT. No new abnormalities.

## 2014-07-08 SURGERY — LITHOTRIPSY, ESWL
Anesthesia: LOCAL | Laterality: Right

## 2014-07-08 MED ORDER — GENTAMICIN SULFATE 40 MG/ML IJ SOLN
280.0000 mg | INTRAVENOUS | Status: AC
  Administered 2014-07-08: 280 mg via INTRAVENOUS
  Filled 2014-07-08: qty 7

## 2014-07-08 MED ORDER — SODIUM CHLORIDE 0.9 % IV SOLN
INTRAVENOUS | Status: DC
  Administered 2014-07-08: 16:00:00 via INTRAVENOUS

## 2014-07-08 MED ORDER — DIPHENHYDRAMINE HCL 25 MG PO CAPS
25.0000 mg | ORAL_CAPSULE | ORAL | Status: AC
  Administered 2014-07-08: 25 mg via ORAL
  Filled 2014-07-08: qty 1

## 2014-07-08 MED ORDER — DIAZEPAM 5 MG PO TABS
10.0000 mg | ORAL_TABLET | ORAL | Status: AC
  Administered 2014-07-08: 10 mg via ORAL
  Filled 2014-07-08: qty 2

## 2014-07-08 NOTE — H&P (Signed)
[name] Page is an 63 y.o. female.    Chief Complaint: Pre-Op Right Shockwave Lithotripsy  HPI:   1 - Nephrolithiasis - Pt with long h/o bilateral stone disease.   Recent Surveillance / Treatment 05/2013 - KUB / Renal US - Rt 53mm renal, 59mm lower pole. Lt 64mm upper pole likely stones 07/2013 - CT Rt 48mm lower pole, 19mm ureteral, Lt 54mm mid pole, scattered punctate --> Bilateral Ureteroscopy / Laser / Stents to stone free 02/2014 - Renal US - small bilat non-obsttructing <54mm, stable left lower cyst. 06/2014 - CT Rt distal ureter 18mm (550HU, SSD 9cm, between SI joint an top of femoral head) and bilateral punctate renal stones by ER imaging and visible on KUB.   2 - Medical Stone Disease / Oliguria / Hypocitraturia-   Eval 2014 - BMP,PTH,Urate - normal ; Composition - CaOx ; 24 hr Urine - low volume (0.5L/day) and low citrate. Subsequenly placed on K-Cit 20 MEQ BID  3 - Left Renal Cyst - 1.5cm lower pole non-complex cyst by [place] 05/2013.  She is on Fosamax for postmenopausal osteoporosis.  PMH sig for osteoporosis, ortho surgeries.  Today [name] Page is seen to proceed with shockwave lithotripsy for primary management of her symptomatic right distal ureteral stone. No interval fevers.   Past Medical History  Diagnosis Date  . Osteoporosis   . Renal calculus, bilateral   . Frequency of urination   . Urgency of urination   . Hematuria   . History of kidney stones   . Dysuria   . Arthritis   . Wears glasses   . Cancer     melanoma    Past Surgical History  Procedure Laterality Date  . Cystoscopy with retrograde pyelogram, ureteroscopy and stent placement Bilateral 08/11/2013    Procedure: [place], URETEROSCOPY AND STENT PLACEMENT;  Surgeon: Alexis Frock, MD;  Location: [facility];  Service: Urology;  Laterality: Bilateral;  . Hemiarthroplasty shoulder fracture Left 1992  . Cystoscopy with ureteroscopy and stent placement Bilateral 08/29/2013   Procedure: CYSTOSCOPY WITH BILATERAL URETEROSCOPY AND STENT REPLACEMENTS, stone extraction;  Surgeon: Alexis Frock, MD;  Location: [facility];  Service: Urology;  Laterality: Bilateral;  . Holmium laser application Bilateral [id number]    Procedure: HOLMIUM LASER APPLICATION;  Surgeon: Alexis Frock, MD;  Location: [facility];  Service: Urology;  Laterality: Bilateral;    Family History  Problem Relation Age of Onset  . Cancer Mother   . Atrial fibrillation Father   . Hyperlipidemia Father   . Hypertension Father   . Hypertension Sister   . Hyperlipidemia Sister   . Thyroid disease Sister    Social History:  reports that she has never smoked. She has never used smokeless tobacco. She reports that she does not drink alcohol or use illicit drugs.  Allergies:  Allergies  Allergen Reactions  . Sulfa Antibiotics Rash    No prescriptions prior to admission    No results found for this or any previous visit (from the past 48 hour(s)). No results found.  Review of Systems  Constitutional: Negative.  Negative for fever and chills.  HENT: Negative.   Eyes: Negative.   Respiratory: Negative.   Cardiovascular: Negative.   Gastrointestinal: Negative for vomiting.  Genitourinary: Positive for flank pain.  Musculoskeletal: Negative.   Skin: Negative.   Neurological: Negative.   Endo/Heme/Allergies: Negative.   Psychiatric/Behavioral: Negative.     There were no vitals taken for this visit. Physical Exam  Constitutional: She  appears well-developed and well-nourished.  HENT:  Head: Normocephalic and atraumatic.  Eyes: Pupils are equal, round, and reactive to light.  Neck: Normal range of motion. Neck supple.  Cardiovascular: Normal rate.   Respiratory: Effort normal.  GI: Soft.  Genitourinary:  Mild Rt CVAT  Musculoskeletal: Normal range of motion.  Neurological: She is alert.  Skin: Skin is warm and dry.  Psychiatric: She has a normal mood  and affect. Her behavior is normal. Judgment and thought content normal.     Assessment/Plan  1 - Nephrolithiasis -  now with recurrence of symptomatic Rt UVJ stone.  After consideration of options, the patient has decided to proceed with SWL on today as planned.   We rediscussed shockwave lithotripsy in detail as well as my "rule of 9s" with stones <28mm, less than 900 HU, and skin to stone distance <9cm having approximately 90% treatment success with single session of treatment. We then readdressed how stones that are larger, more dense, and in patients with less favorable anatomy have incrementally decreased success rates. We rediscussed risks including, bleeding, infection, hematoma, loss of kidney, need for staged therapy, need for adjunctive therapy and requirement to refrain from any anticoagulants, anti-platelet or aspirin-like products peri-procedureally.    2 - Medical Stone Disease / Oliguria / Hypocitraturia- Contiue K-Cit and increased hydration. Given relativley rapid stone recurrence, I want to repeat 24 hr urines on current therapy when stone free.   3 - Left Renal Cyst -  Will inherently surveil prn along with stones as per above. NO interval change by ER CT.   [name] Page 07/08/2014, 6:43 AM

## 2014-07-08 NOTE — Discharge Instructions (Signed)
1 - You may have urinary urgency (bladder spasms) and bloody urine on / off as well as some flank pain and fragment passage over the next few days. This is normal.  2 - Call MD or go to ER for fever >102, severe pain / nausea / vomiting not relieved by medications, or acute change in medical status

## 2015-01-30 DIAGNOSIS — M9901 Segmental and somatic dysfunction of cervical region: Secondary | ICD-10-CM | POA: Diagnosis not present

## 2015-01-30 DIAGNOSIS — M5137 Other intervertebral disc degeneration, lumbosacral region: Secondary | ICD-10-CM | POA: Diagnosis not present

## 2015-01-30 DIAGNOSIS — M9902 Segmental and somatic dysfunction of thoracic region: Secondary | ICD-10-CM | POA: Diagnosis not present

## 2015-01-30 DIAGNOSIS — M9903 Segmental and somatic dysfunction of lumbar region: Secondary | ICD-10-CM | POA: Diagnosis not present

## 2015-02-05 DIAGNOSIS — M9902 Segmental and somatic dysfunction of thoracic region: Secondary | ICD-10-CM | POA: Diagnosis not present

## 2015-02-05 DIAGNOSIS — M9901 Segmental and somatic dysfunction of cervical region: Secondary | ICD-10-CM | POA: Diagnosis not present

## 2015-02-05 DIAGNOSIS — M5137 Other intervertebral disc degeneration, lumbosacral region: Secondary | ICD-10-CM | POA: Diagnosis not present

## 2015-02-05 DIAGNOSIS — M9903 Segmental and somatic dysfunction of lumbar region: Secondary | ICD-10-CM | POA: Diagnosis not present

## 2015-02-10 DIAGNOSIS — M5137 Other intervertebral disc degeneration, lumbosacral region: Secondary | ICD-10-CM | POA: Diagnosis not present

## 2015-02-10 DIAGNOSIS — M9903 Segmental and somatic dysfunction of lumbar region: Secondary | ICD-10-CM | POA: Diagnosis not present

## 2015-02-10 DIAGNOSIS — M9902 Segmental and somatic dysfunction of thoracic region: Secondary | ICD-10-CM | POA: Diagnosis not present

## 2015-02-10 DIAGNOSIS — M9901 Segmental and somatic dysfunction of cervical region: Secondary | ICD-10-CM | POA: Diagnosis not present

## 2015-02-12 DIAGNOSIS — M9901 Segmental and somatic dysfunction of cervical region: Secondary | ICD-10-CM | POA: Diagnosis not present

## 2015-02-12 DIAGNOSIS — M9902 Segmental and somatic dysfunction of thoracic region: Secondary | ICD-10-CM | POA: Diagnosis not present

## 2015-02-12 DIAGNOSIS — M5137 Other intervertebral disc degeneration, lumbosacral region: Secondary | ICD-10-CM | POA: Diagnosis not present

## 2015-02-12 DIAGNOSIS — M9903 Segmental and somatic dysfunction of lumbar region: Secondary | ICD-10-CM | POA: Diagnosis not present

## 2015-02-19 DIAGNOSIS — M9901 Segmental and somatic dysfunction of cervical region: Secondary | ICD-10-CM | POA: Diagnosis not present

## 2015-02-19 DIAGNOSIS — M9903 Segmental and somatic dysfunction of lumbar region: Secondary | ICD-10-CM | POA: Diagnosis not present

## 2015-02-19 DIAGNOSIS — M5137 Other intervertebral disc degeneration, lumbosacral region: Secondary | ICD-10-CM | POA: Diagnosis not present

## 2015-02-19 DIAGNOSIS — M9902 Segmental and somatic dysfunction of thoracic region: Secondary | ICD-10-CM | POA: Diagnosis not present

## 2015-03-05 DIAGNOSIS — M9902 Segmental and somatic dysfunction of thoracic region: Secondary | ICD-10-CM | POA: Diagnosis not present

## 2015-03-05 DIAGNOSIS — M5137 Other intervertebral disc degeneration, lumbosacral region: Secondary | ICD-10-CM | POA: Diagnosis not present

## 2015-03-05 DIAGNOSIS — M9901 Segmental and somatic dysfunction of cervical region: Secondary | ICD-10-CM | POA: Diagnosis not present

## 2015-03-05 DIAGNOSIS — M9903 Segmental and somatic dysfunction of lumbar region: Secondary | ICD-10-CM | POA: Diagnosis not present

## 2015-03-19 DIAGNOSIS — M5137 Other intervertebral disc degeneration, lumbosacral region: Secondary | ICD-10-CM | POA: Diagnosis not present

## 2015-03-19 DIAGNOSIS — M9901 Segmental and somatic dysfunction of cervical region: Secondary | ICD-10-CM | POA: Diagnosis not present

## 2015-03-19 DIAGNOSIS — M9902 Segmental and somatic dysfunction of thoracic region: Secondary | ICD-10-CM | POA: Diagnosis not present

## 2015-03-19 DIAGNOSIS — M9903 Segmental and somatic dysfunction of lumbar region: Secondary | ICD-10-CM | POA: Diagnosis not present

## 2015-04-02 DIAGNOSIS — M9902 Segmental and somatic dysfunction of thoracic region: Secondary | ICD-10-CM | POA: Diagnosis not present

## 2015-04-02 DIAGNOSIS — M5137 Other intervertebral disc degeneration, lumbosacral region: Secondary | ICD-10-CM | POA: Diagnosis not present

## 2015-04-02 DIAGNOSIS — M9903 Segmental and somatic dysfunction of lumbar region: Secondary | ICD-10-CM | POA: Diagnosis not present

## 2015-04-02 DIAGNOSIS — M9901 Segmental and somatic dysfunction of cervical region: Secondary | ICD-10-CM | POA: Diagnosis not present

## 2015-04-16 DIAGNOSIS — M9901 Segmental and somatic dysfunction of cervical region: Secondary | ICD-10-CM | POA: Diagnosis not present

## 2015-04-16 DIAGNOSIS — M9902 Segmental and somatic dysfunction of thoracic region: Secondary | ICD-10-CM | POA: Diagnosis not present

## 2015-04-16 DIAGNOSIS — M9903 Segmental and somatic dysfunction of lumbar region: Secondary | ICD-10-CM | POA: Diagnosis not present

## 2015-04-16 DIAGNOSIS — M5137 Other intervertebral disc degeneration, lumbosacral region: Secondary | ICD-10-CM | POA: Diagnosis not present

## 2015-05-01 DIAGNOSIS — M9903 Segmental and somatic dysfunction of lumbar region: Secondary | ICD-10-CM | POA: Diagnosis not present

## 2015-05-01 DIAGNOSIS — M9902 Segmental and somatic dysfunction of thoracic region: Secondary | ICD-10-CM | POA: Diagnosis not present

## 2015-05-01 DIAGNOSIS — M5137 Other intervertebral disc degeneration, lumbosacral region: Secondary | ICD-10-CM | POA: Diagnosis not present

## 2015-05-01 DIAGNOSIS — M9901 Segmental and somatic dysfunction of cervical region: Secondary | ICD-10-CM | POA: Diagnosis not present

## 2015-05-12 DIAGNOSIS — R8299 Other abnormal findings in urine: Secondary | ICD-10-CM | POA: Diagnosis not present

## 2015-05-12 DIAGNOSIS — N281 Cyst of kidney, acquired: Secondary | ICD-10-CM | POA: Diagnosis not present

## 2015-05-12 DIAGNOSIS — R34 Anuria and oliguria: Secondary | ICD-10-CM | POA: Diagnosis not present

## 2015-05-12 DIAGNOSIS — N200 Calculus of kidney: Secondary | ICD-10-CM | POA: Diagnosis not present

## 2015-05-14 DIAGNOSIS — M9905 Segmental and somatic dysfunction of pelvic region: Secondary | ICD-10-CM | POA: Diagnosis not present

## 2015-05-14 DIAGNOSIS — M9902 Segmental and somatic dysfunction of thoracic region: Secondary | ICD-10-CM | POA: Diagnosis not present

## 2015-05-14 DIAGNOSIS — M9904 Segmental and somatic dysfunction of sacral region: Secondary | ICD-10-CM | POA: Diagnosis not present

## 2015-05-14 DIAGNOSIS — M5030 Other cervical disc degeneration, unspecified cervical region: Secondary | ICD-10-CM | POA: Diagnosis not present

## 2015-05-14 DIAGNOSIS — M9903 Segmental and somatic dysfunction of lumbar region: Secondary | ICD-10-CM | POA: Diagnosis not present

## 2015-05-14 DIAGNOSIS — M9901 Segmental and somatic dysfunction of cervical region: Secondary | ICD-10-CM | POA: Diagnosis not present

## 2015-05-28 DIAGNOSIS — M9904 Segmental and somatic dysfunction of sacral region: Secondary | ICD-10-CM | POA: Diagnosis not present

## 2015-05-28 DIAGNOSIS — M9902 Segmental and somatic dysfunction of thoracic region: Secondary | ICD-10-CM | POA: Diagnosis not present

## 2015-05-28 DIAGNOSIS — M5030 Other cervical disc degeneration, unspecified cervical region: Secondary | ICD-10-CM | POA: Diagnosis not present

## 2015-05-28 DIAGNOSIS — M9905 Segmental and somatic dysfunction of pelvic region: Secondary | ICD-10-CM | POA: Diagnosis not present

## 2015-05-28 DIAGNOSIS — M9903 Segmental and somatic dysfunction of lumbar region: Secondary | ICD-10-CM | POA: Diagnosis not present

## 2015-05-28 DIAGNOSIS — M9901 Segmental and somatic dysfunction of cervical region: Secondary | ICD-10-CM | POA: Diagnosis not present

## 2015-06-05 DIAGNOSIS — M9902 Segmental and somatic dysfunction of thoracic region: Secondary | ICD-10-CM | POA: Diagnosis not present

## 2015-06-05 DIAGNOSIS — M5030 Other cervical disc degeneration, unspecified cervical region: Secondary | ICD-10-CM | POA: Diagnosis not present

## 2015-06-05 DIAGNOSIS — M9903 Segmental and somatic dysfunction of lumbar region: Secondary | ICD-10-CM | POA: Diagnosis not present

## 2015-06-05 DIAGNOSIS — M9904 Segmental and somatic dysfunction of sacral region: Secondary | ICD-10-CM | POA: Diagnosis not present

## 2015-06-05 DIAGNOSIS — M9905 Segmental and somatic dysfunction of pelvic region: Secondary | ICD-10-CM | POA: Diagnosis not present

## 2015-06-05 DIAGNOSIS — M9901 Segmental and somatic dysfunction of cervical region: Secondary | ICD-10-CM | POA: Diagnosis not present

## 2015-06-12 DIAGNOSIS — M9901 Segmental and somatic dysfunction of cervical region: Secondary | ICD-10-CM | POA: Diagnosis not present

## 2015-06-12 DIAGNOSIS — M9904 Segmental and somatic dysfunction of sacral region: Secondary | ICD-10-CM | POA: Diagnosis not present

## 2015-06-12 DIAGNOSIS — M9903 Segmental and somatic dysfunction of lumbar region: Secondary | ICD-10-CM | POA: Diagnosis not present

## 2015-06-12 DIAGNOSIS — M5030 Other cervical disc degeneration, unspecified cervical region: Secondary | ICD-10-CM | POA: Diagnosis not present

## 2015-06-12 DIAGNOSIS — M9905 Segmental and somatic dysfunction of pelvic region: Secondary | ICD-10-CM | POA: Diagnosis not present

## 2015-06-12 DIAGNOSIS — M9902 Segmental and somatic dysfunction of thoracic region: Secondary | ICD-10-CM | POA: Diagnosis not present

## 2015-06-26 DIAGNOSIS — M9904 Segmental and somatic dysfunction of sacral region: Secondary | ICD-10-CM | POA: Diagnosis not present

## 2015-06-26 DIAGNOSIS — M9902 Segmental and somatic dysfunction of thoracic region: Secondary | ICD-10-CM | POA: Diagnosis not present

## 2015-06-26 DIAGNOSIS — M9903 Segmental and somatic dysfunction of lumbar region: Secondary | ICD-10-CM | POA: Diagnosis not present

## 2015-06-26 DIAGNOSIS — M9905 Segmental and somatic dysfunction of pelvic region: Secondary | ICD-10-CM | POA: Diagnosis not present

## 2015-06-26 DIAGNOSIS — M5030 Other cervical disc degeneration, unspecified cervical region: Secondary | ICD-10-CM | POA: Diagnosis not present

## 2015-06-26 DIAGNOSIS — M9901 Segmental and somatic dysfunction of cervical region: Secondary | ICD-10-CM | POA: Diagnosis not present

## 2015-07-10 DIAGNOSIS — M9904 Segmental and somatic dysfunction of sacral region: Secondary | ICD-10-CM | POA: Diagnosis not present

## 2015-07-10 DIAGNOSIS — M9902 Segmental and somatic dysfunction of thoracic region: Secondary | ICD-10-CM | POA: Diagnosis not present

## 2015-07-10 DIAGNOSIS — M9903 Segmental and somatic dysfunction of lumbar region: Secondary | ICD-10-CM | POA: Diagnosis not present

## 2015-07-10 DIAGNOSIS — M9901 Segmental and somatic dysfunction of cervical region: Secondary | ICD-10-CM | POA: Diagnosis not present

## 2015-07-10 DIAGNOSIS — M5030 Other cervical disc degeneration, unspecified cervical region: Secondary | ICD-10-CM | POA: Diagnosis not present

## 2015-07-10 DIAGNOSIS — M9905 Segmental and somatic dysfunction of pelvic region: Secondary | ICD-10-CM | POA: Diagnosis not present

## 2015-07-24 DIAGNOSIS — M9905 Segmental and somatic dysfunction of pelvic region: Secondary | ICD-10-CM | POA: Diagnosis not present

## 2015-07-24 DIAGNOSIS — M9902 Segmental and somatic dysfunction of thoracic region: Secondary | ICD-10-CM | POA: Diagnosis not present

## 2015-07-24 DIAGNOSIS — M9903 Segmental and somatic dysfunction of lumbar region: Secondary | ICD-10-CM | POA: Diagnosis not present

## 2015-07-24 DIAGNOSIS — M5030 Other cervical disc degeneration, unspecified cervical region: Secondary | ICD-10-CM | POA: Diagnosis not present

## 2015-07-24 DIAGNOSIS — M9901 Segmental and somatic dysfunction of cervical region: Secondary | ICD-10-CM | POA: Diagnosis not present

## 2015-07-24 DIAGNOSIS — M9904 Segmental and somatic dysfunction of sacral region: Secondary | ICD-10-CM | POA: Diagnosis not present

## 2015-08-20 DIAGNOSIS — M9903 Segmental and somatic dysfunction of lumbar region: Secondary | ICD-10-CM | POA: Diagnosis not present

## 2015-08-20 DIAGNOSIS — M9902 Segmental and somatic dysfunction of thoracic region: Secondary | ICD-10-CM | POA: Diagnosis not present

## 2015-08-20 DIAGNOSIS — M9901 Segmental and somatic dysfunction of cervical region: Secondary | ICD-10-CM | POA: Diagnosis not present

## 2015-08-20 DIAGNOSIS — M5137 Other intervertebral disc degeneration, lumbosacral region: Secondary | ICD-10-CM | POA: Diagnosis not present

## 2015-09-05 DIAGNOSIS — M810 Age-related osteoporosis without current pathological fracture: Secondary | ICD-10-CM | POA: Diagnosis not present

## 2015-09-05 DIAGNOSIS — Z1231 Encounter for screening mammogram for malignant neoplasm of breast: Secondary | ICD-10-CM | POA: Diagnosis not present

## 2015-09-05 LAB — HM DEXA SCAN

## 2015-09-05 LAB — HM MAMMOGRAPHY

## 2015-09-09 ENCOUNTER — Encounter: Payer: Self-pay | Admitting: *Deleted

## 2015-09-17 DIAGNOSIS — M9903 Segmental and somatic dysfunction of lumbar region: Secondary | ICD-10-CM | POA: Diagnosis not present

## 2015-09-17 DIAGNOSIS — M9901 Segmental and somatic dysfunction of cervical region: Secondary | ICD-10-CM | POA: Diagnosis not present

## 2015-09-17 DIAGNOSIS — M5137 Other intervertebral disc degeneration, lumbosacral region: Secondary | ICD-10-CM | POA: Diagnosis not present

## 2015-09-17 DIAGNOSIS — M9902 Segmental and somatic dysfunction of thoracic region: Secondary | ICD-10-CM | POA: Diagnosis not present

## 2015-09-25 ENCOUNTER — Encounter: Payer: Self-pay | Admitting: *Deleted

## 2015-10-15 DIAGNOSIS — M9903 Segmental and somatic dysfunction of lumbar region: Secondary | ICD-10-CM | POA: Diagnosis not present

## 2015-10-15 DIAGNOSIS — M5137 Other intervertebral disc degeneration, lumbosacral region: Secondary | ICD-10-CM | POA: Diagnosis not present

## 2015-10-15 DIAGNOSIS — M9902 Segmental and somatic dysfunction of thoracic region: Secondary | ICD-10-CM | POA: Diagnosis not present

## 2015-10-15 DIAGNOSIS — M9901 Segmental and somatic dysfunction of cervical region: Secondary | ICD-10-CM | POA: Diagnosis not present

## 2015-10-30 ENCOUNTER — Encounter: Payer: Self-pay | Admitting: Pediatrics

## 2015-10-30 ENCOUNTER — Ambulatory Visit (INDEPENDENT_AMBULATORY_CARE_PROVIDER_SITE_OTHER): Payer: Medicare Other

## 2015-10-30 ENCOUNTER — Encounter (INDEPENDENT_AMBULATORY_CARE_PROVIDER_SITE_OTHER): Payer: Self-pay

## 2015-10-30 ENCOUNTER — Ambulatory Visit (INDEPENDENT_AMBULATORY_CARE_PROVIDER_SITE_OTHER): Payer: Medicare Other | Admitting: Pediatrics

## 2015-10-30 ENCOUNTER — Encounter: Payer: Self-pay | Admitting: Pharmacist

## 2015-10-30 VITALS — BP 144/79 | HR 74 | Temp 97.6°F | Ht 62.0 in | Wt 134.8 lb

## 2015-10-30 DIAGNOSIS — M810 Age-related osteoporosis without current pathological fracture: Secondary | ICD-10-CM | POA: Diagnosis not present

## 2015-10-30 DIAGNOSIS — Z23 Encounter for immunization: Secondary | ICD-10-CM | POA: Diagnosis not present

## 2015-10-30 DIAGNOSIS — M25572 Pain in left ankle and joints of left foot: Secondary | ICD-10-CM

## 2015-10-30 DIAGNOSIS — R030 Elevated blood-pressure reading, without diagnosis of hypertension: Secondary | ICD-10-CM

## 2015-10-30 DIAGNOSIS — IMO0001 Reserved for inherently not codable concepts without codable children: Secondary | ICD-10-CM

## 2015-10-30 IMAGING — CR DG FOOT COMPLETE 3+V*L*
3 series · 3 of 3 positions shown · non-contrast
Comparison: No recent.

CLINICAL DATA: Pain swelling.  No known injury.

EXAM:
LEFT FOOT - COMPLETE 3+ VIEW

[view not recorded (1 of 3)]
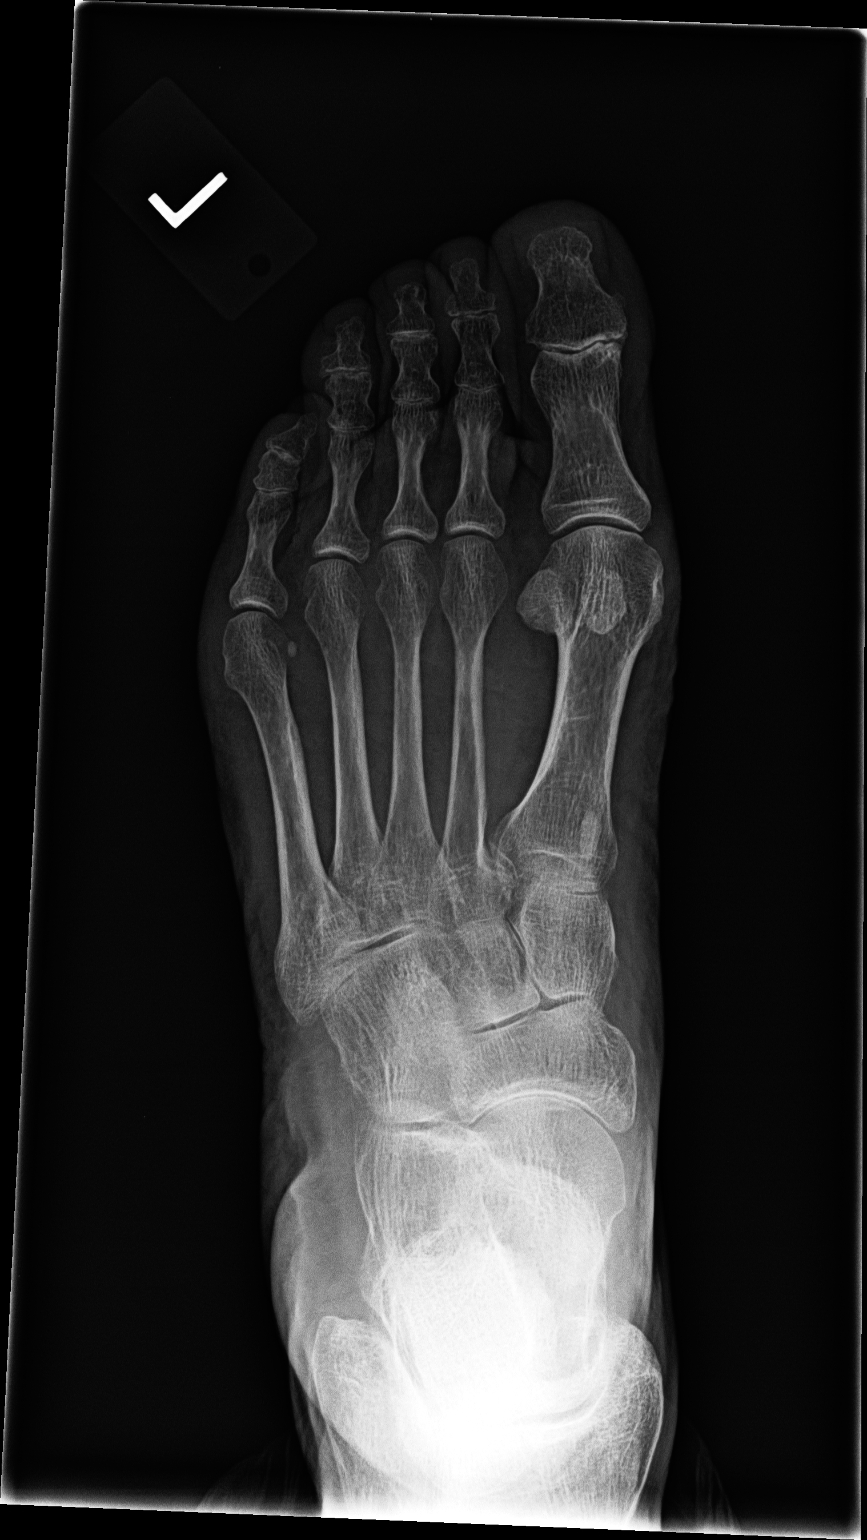

[view not recorded (2 of 3)]
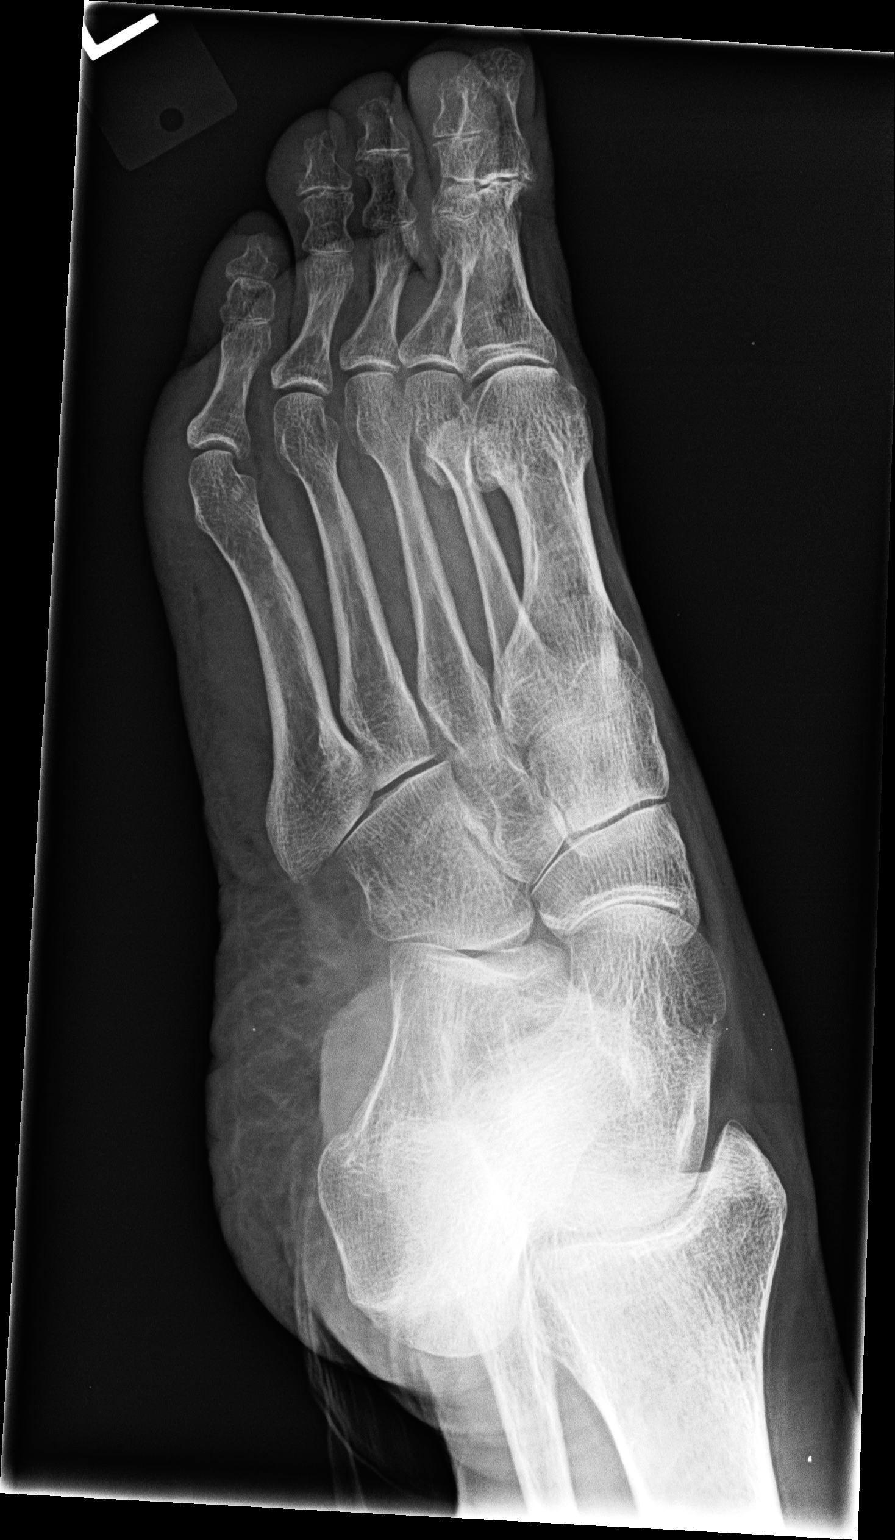

[view not recorded (3 of 3)]
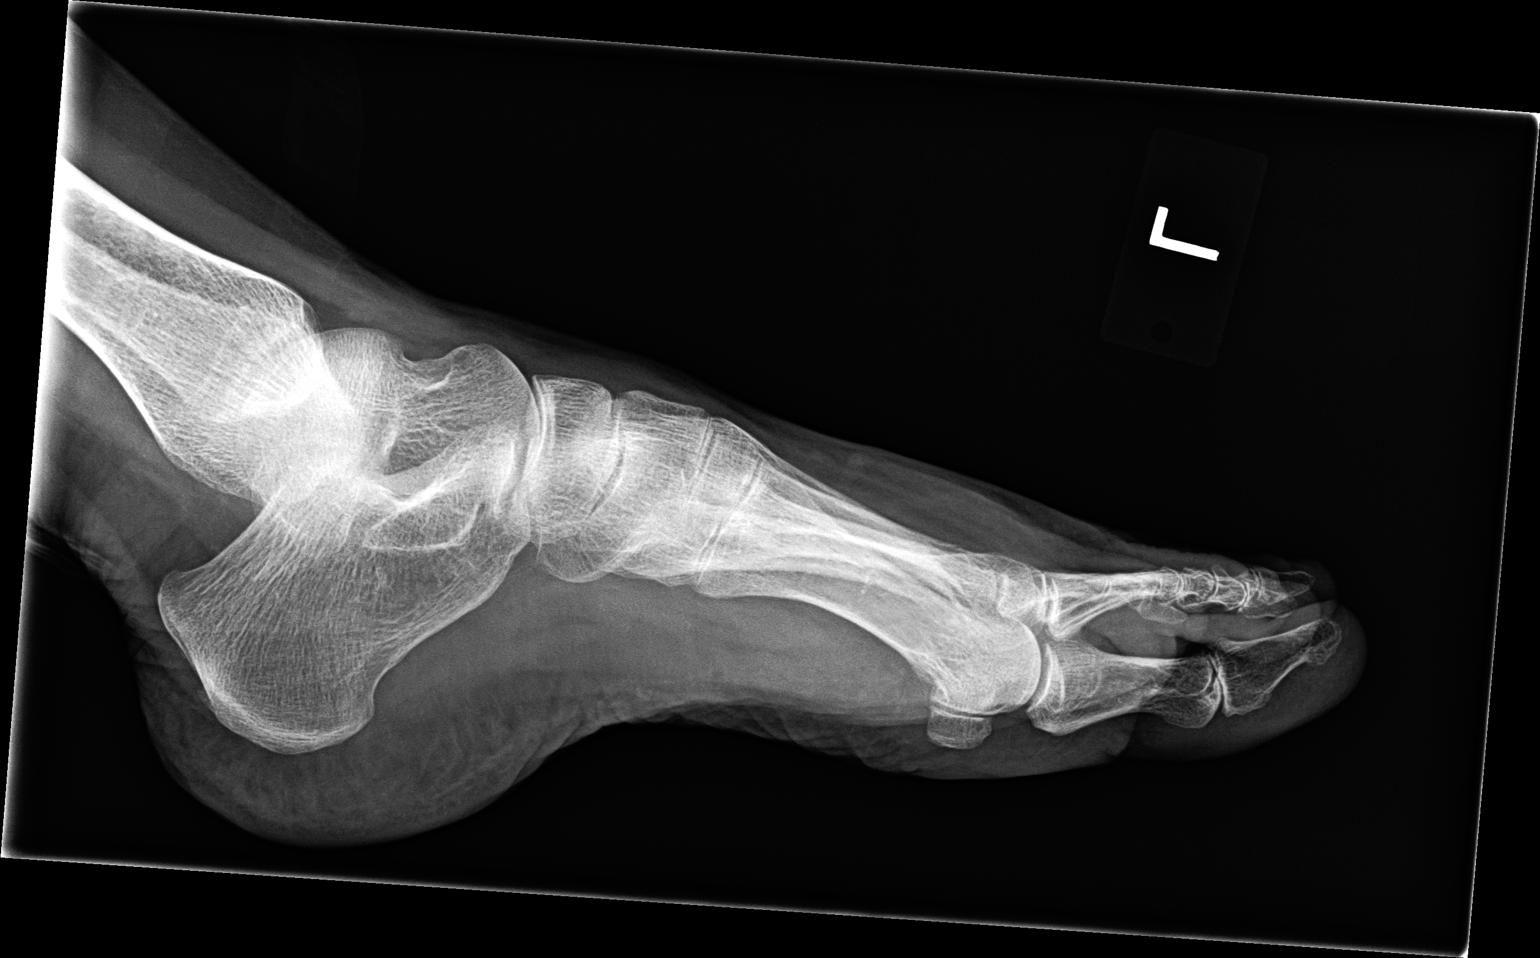

[3 of 3 positions shown; findings below may reference images not displayed]

FINDINGS: Diffuse mild soft tissue swelling. No acute bony or joint
abnormality identified.
IMPRESSION: No acute bony or joint abnormality. No evidence of fracture or
dislocation.

## 2015-10-30 NOTE — Progress Notes (Signed)
Subjective:    Patient ID: [name] Page, female    DOB: 28-Nov-1950, 65 y.o.   MRN: [id number]  CC: Dexa scan   HPI: [name] Page is a 65 y.o. female presenting for Dexa scan   Here with recent DEXA scan from OB/gyn with T-score consistent with osteoporosis. Dec 2015 was taken off of alendronate after being on it for 5 years. At that time her Dexa was stable.  Has had no other prior treatments for osteoporosis. Tolerated alendronate well  Also taking vitamin D and calcium No fractures Mother died in her 70s, father with osteoporosis, no fractures. Non-smoker, no steroid use.  Having some L foot pain on ball of foot with walking. Not daily, ongoing for a couple of weeks. Has been walking more than usual as has recently moved, doesn't remember injuring it or dropping anything on the foot. Slightly swollen compared to R, no bruises.   Depression screen PHQ 2/9 10/30/2015  Decreased Interest 0  Down, Depressed, Hopeless 0  PHQ - 2 Score 0       ROS: All systems negative other than what is in HPI  Past Medical History Patient Active Problem List   Diagnosis Date Noted  . Osteoporosis 10/30/2015   Social History   Social History  . Marital Status: Married    Spouse Name: N/A  . Number of Children: N/A  . Years of Education: N/A   Occupational History  . Not on file.   Social History Main Topics  . Smoking status: Never Smoker   . Smokeless tobacco: Never Used  . Alcohol Use: No  . Drug Use: No  . Sexual Activity: Not on file   Other Topics Concern  . Not on file   Social History Narrative   Family History  Problem Relation Age of Onset  . Cancer Mother   . Atrial fibrillation Father   . Hyperlipidemia Father   . Hypertension Father   . Hypertension Sister   . Hyperlipidemia Sister   . Thyroid disease Sister   osteoporosis father      Objective:    BP 144/79 mmHg  Pulse 74  Temp(Src) 97.6 F (36.4 C) (Oral)  Ht 5\' 2"  (1.575 m)  Wt 134  lb 12.8 oz (61.145 kg)  BMI 24.65 kg/m2  Wt Readings from Last 3 Encounters:  10/30/15 134 lb 12.8 oz (61.145 kg)  07/08/14 125 lb (56.7 kg)  07/04/14 125 lb (56.7 kg)    Gen: NAD, alert, cooperative with exam, NCAT EYES: EOMI, no scleral injection or icterus ENT:  TMs pearly gray b/l, OP without erythema LYMPH: no cervical LAD CV: NRRR, normal S1/S2, no murmur, distal pulses 2+ b/l Resp: CTABL, no wheezes, normal WOB Abd: +BS, soft, NTND. no guarding or organomegaly Ext: No edema, warm Neuro: Alert and oriented, strength equal b/l UE and LE, coordination grossly normal MSK: TTP ball of foot below 3rd and 4th MTP joints, also TTP along 3rd and 4th metatarsals. Slightly swollen dorsum of L foot compared with R. No bruising, redness. Full ROM of toes, ankles b/l.     Assessment & Plan:    [name] was seen today for osteoporosis and foot pain.  Diagnoses and all orders for this visit:  Pain in joint, ankle and foot, left No acute fractures in foot. POssibly pain from recent overuse. Discussed wearing shoes with adequate padding in them, RICE, let me know if not improving. -     DG Foot Complete Left; Future  Osteoporosis Restart alendronate. Will see if insurance will cover prolia. Repeat dexa in 2 years, consider Forteo if not improving. Continue calcium and vitamin D. -     alendronate (FOSAMAX) 70 MG tablet; Take 1 tablet (70 mg total) by mouth every 7 (seven) days. Reported on 10/30/2015  Need for prophylactic vaccination with Streptococcus pneumoniae (Pneumococcus) and Influenza vaccines -     Pneumococcal conjugate vaccine 13-valent  Elevated blood pressure Not on medicines for BP now. Check BP at home, let me know if regularly elevated.  Follow up plan: 1 year or as needed  Assunta Found, MD [facility] 10/30/2015, 12:56 PM

## 2015-10-31 MED ORDER — ALENDRONATE SODIUM 70 MG PO TABS: 70.0000 mg | ORAL_TABLET | ORAL | Status: DC

## 2015-11-01 DIAGNOSIS — I10 Essential (primary) hypertension: Secondary | ICD-10-CM | POA: Insufficient documentation

## 2015-11-01 DIAGNOSIS — R030 Elevated blood-pressure reading, without diagnosis of hypertension: Secondary | ICD-10-CM

## 2015-11-05 DIAGNOSIS — M9901 Segmental and somatic dysfunction of cervical region: Secondary | ICD-10-CM | POA: Diagnosis not present

## 2015-11-05 DIAGNOSIS — M9905 Segmental and somatic dysfunction of pelvic region: Secondary | ICD-10-CM | POA: Diagnosis not present

## 2015-11-05 DIAGNOSIS — M5030 Other cervical disc degeneration, unspecified cervical region: Secondary | ICD-10-CM | POA: Diagnosis not present

## 2015-11-05 DIAGNOSIS — M9904 Segmental and somatic dysfunction of sacral region: Secondary | ICD-10-CM | POA: Diagnosis not present

## 2015-11-05 DIAGNOSIS — M9902 Segmental and somatic dysfunction of thoracic region: Secondary | ICD-10-CM | POA: Diagnosis not present

## 2015-11-05 DIAGNOSIS — M9903 Segmental and somatic dysfunction of lumbar region: Secondary | ICD-10-CM | POA: Diagnosis not present

## 2015-11-26 DIAGNOSIS — M9901 Segmental and somatic dysfunction of cervical region: Secondary | ICD-10-CM | POA: Diagnosis not present

## 2015-11-26 DIAGNOSIS — M5030 Other cervical disc degeneration, unspecified cervical region: Secondary | ICD-10-CM | POA: Diagnosis not present

## 2015-11-26 DIAGNOSIS — M9903 Segmental and somatic dysfunction of lumbar region: Secondary | ICD-10-CM | POA: Diagnosis not present

## 2015-11-26 DIAGNOSIS — M9902 Segmental and somatic dysfunction of thoracic region: Secondary | ICD-10-CM | POA: Diagnosis not present

## 2015-11-26 DIAGNOSIS — M9904 Segmental and somatic dysfunction of sacral region: Secondary | ICD-10-CM | POA: Diagnosis not present

## 2015-11-26 DIAGNOSIS — M9905 Segmental and somatic dysfunction of pelvic region: Secondary | ICD-10-CM | POA: Diagnosis not present

## 2015-11-27 ENCOUNTER — Telehealth: Payer: Self-pay | Admitting: Pharmacist

## 2015-11-27 NOTE — Telephone Encounter (Signed)
I received insurance coverage verification for Prolia.  Patient's insurance will cover 100%.  Patient has questions about side effects of Prolia and we discussed the pros and cons of both alendronate and Prolia.  Patient would like to continue alendronate for now.  She is to call if she has further questions or would like to start Prolia in the future.

## 2015-12-10 DIAGNOSIS — M9903 Segmental and somatic dysfunction of lumbar region: Secondary | ICD-10-CM | POA: Diagnosis not present

## 2015-12-10 DIAGNOSIS — M9904 Segmental and somatic dysfunction of sacral region: Secondary | ICD-10-CM | POA: Diagnosis not present

## 2015-12-10 DIAGNOSIS — M5030 Other cervical disc degeneration, unspecified cervical region: Secondary | ICD-10-CM | POA: Diagnosis not present

## 2015-12-10 DIAGNOSIS — M9905 Segmental and somatic dysfunction of pelvic region: Secondary | ICD-10-CM | POA: Diagnosis not present

## 2015-12-10 DIAGNOSIS — M9901 Segmental and somatic dysfunction of cervical region: Secondary | ICD-10-CM | POA: Diagnosis not present

## 2015-12-10 DIAGNOSIS — M9902 Segmental and somatic dysfunction of thoracic region: Secondary | ICD-10-CM | POA: Diagnosis not present

## 2015-12-24 DIAGNOSIS — M5030 Other cervical disc degeneration, unspecified cervical region: Secondary | ICD-10-CM | POA: Diagnosis not present

## 2015-12-24 DIAGNOSIS — M9901 Segmental and somatic dysfunction of cervical region: Secondary | ICD-10-CM | POA: Diagnosis not present

## 2015-12-24 DIAGNOSIS — M9903 Segmental and somatic dysfunction of lumbar region: Secondary | ICD-10-CM | POA: Diagnosis not present

## 2015-12-24 DIAGNOSIS — M9904 Segmental and somatic dysfunction of sacral region: Secondary | ICD-10-CM | POA: Diagnosis not present

## 2015-12-24 DIAGNOSIS — M9905 Segmental and somatic dysfunction of pelvic region: Secondary | ICD-10-CM | POA: Diagnosis not present

## 2015-12-24 DIAGNOSIS — M9902 Segmental and somatic dysfunction of thoracic region: Secondary | ICD-10-CM | POA: Diagnosis not present

## 2016-01-14 DIAGNOSIS — M9905 Segmental and somatic dysfunction of pelvic region: Secondary | ICD-10-CM | POA: Diagnosis not present

## 2016-01-14 DIAGNOSIS — M9904 Segmental and somatic dysfunction of sacral region: Secondary | ICD-10-CM | POA: Diagnosis not present

## 2016-01-14 DIAGNOSIS — M9902 Segmental and somatic dysfunction of thoracic region: Secondary | ICD-10-CM | POA: Diagnosis not present

## 2016-01-14 DIAGNOSIS — M9901 Segmental and somatic dysfunction of cervical region: Secondary | ICD-10-CM | POA: Diagnosis not present

## 2016-01-14 DIAGNOSIS — M9903 Segmental and somatic dysfunction of lumbar region: Secondary | ICD-10-CM | POA: Diagnosis not present

## 2016-01-14 DIAGNOSIS — M5030 Other cervical disc degeneration, unspecified cervical region: Secondary | ICD-10-CM | POA: Diagnosis not present

## 2016-01-29 DIAGNOSIS — M9905 Segmental and somatic dysfunction of pelvic region: Secondary | ICD-10-CM | POA: Diagnosis not present

## 2016-01-29 DIAGNOSIS — M5430 Sciatica, unspecified side: Secondary | ICD-10-CM | POA: Diagnosis not present

## 2016-01-29 DIAGNOSIS — M9904 Segmental and somatic dysfunction of sacral region: Secondary | ICD-10-CM | POA: Diagnosis not present

## 2016-01-29 DIAGNOSIS — M9903 Segmental and somatic dysfunction of lumbar region: Secondary | ICD-10-CM | POA: Diagnosis not present

## 2016-01-29 DIAGNOSIS — M9902 Segmental and somatic dysfunction of thoracic region: Secondary | ICD-10-CM | POA: Diagnosis not present

## 2016-01-29 DIAGNOSIS — M9901 Segmental and somatic dysfunction of cervical region: Secondary | ICD-10-CM | POA: Diagnosis not present

## 2016-01-30 ENCOUNTER — Emergency Department (HOSPITAL_COMMUNITY): Payer: Medicare Other

## 2016-01-30 ENCOUNTER — Encounter (HOSPITAL_COMMUNITY): Payer: Self-pay | Admitting: Emergency Medicine

## 2016-01-30 ENCOUNTER — Emergency Department (HOSPITAL_COMMUNITY)
Admission: EM | Admit: 2016-01-30 | Discharge: 2016-01-31 | Disposition: A | Discharge status: Home / Self Care | Payer: Medicare Other | Attending: Emergency Medicine | Admitting: Emergency Medicine

## 2016-01-30 DIAGNOSIS — R1031 Right lower quadrant pain: Secondary | ICD-10-CM | POA: Diagnosis not present

## 2016-01-30 DIAGNOSIS — Z791 Long term (current) use of non-steroidal anti-inflammatories (NSAID): Secondary | ICD-10-CM | POA: Insufficient documentation

## 2016-01-30 DIAGNOSIS — Z79899 Other long term (current) drug therapy: Secondary | ICD-10-CM | POA: Insufficient documentation

## 2016-01-30 DIAGNOSIS — N201 Calculus of ureter: Secondary | ICD-10-CM | POA: Diagnosis not present

## 2016-01-30 DIAGNOSIS — R109 Unspecified abdominal pain: Secondary | ICD-10-CM | POA: Diagnosis present

## 2016-01-30 DIAGNOSIS — N23 Unspecified renal colic: Secondary | ICD-10-CM

## 2016-01-30 DIAGNOSIS — M1990 Unspecified osteoarthritis, unspecified site: Secondary | ICD-10-CM | POA: Diagnosis not present

## 2016-01-30 DIAGNOSIS — N1330 Unspecified hydronephrosis: Secondary | ICD-10-CM

## 2016-01-30 DIAGNOSIS — Z87442 Personal history of urinary calculi: Secondary | ICD-10-CM | POA: Insufficient documentation

## 2016-01-30 DIAGNOSIS — N132 Hydronephrosis with renal and ureteral calculous obstruction: Secondary | ICD-10-CM | POA: Insufficient documentation

## 2016-01-30 LAB — CBC WITH DIFFERENTIAL/PLATELET
BASOS ABS: 0 10*3/uL (ref 0.0–0.1)
BASOS PCT: 0 %
EOS ABS: 0 10*3/uL (ref 0.0–0.7)
Eosinophils Relative: 0 %
HEMATOCRIT: 38 % (ref 36.0–46.0)
HEMOGLOBIN: 11.9 g/dL — AB (ref 12.0–15.0)
Lymphocytes Relative: 7 %
Lymphs Abs: 0.7 10*3/uL (ref 0.7–4.0)
MCH: 26.6 pg (ref 26.0–34.0)
MCHC: 31.3 g/dL (ref 30.0–36.0)
MCV: 84.8 fL (ref 78.0–100.0)
MONOS PCT: 3 %
Monocytes Absolute: 0.3 10*3/uL (ref 0.1–1.0)
NEUTROS ABS: 8.5 10*3/uL — AB (ref 1.7–7.7)
NEUTROS PCT: 90 %
Platelets: 204 10*3/uL (ref 150–400)
RBC: 4.48 MIL/uL (ref 3.87–5.11)
RDW: 13.9 % (ref 11.5–15.5)
WBC: 9.5 10*3/uL (ref 4.0–10.5)

## 2016-01-30 LAB — I-STAT CHEM 8, ED
BUN: 20 mg/dL (ref 6–20)
CALCIUM ION: 1.08 mmol/L — AB (ref 1.13–1.30)
Chloride: 107 mmol/L (ref 101–111)
Creatinine, Ser: 0.7 mg/dL (ref 0.44–1.00)
Glucose, Bld: 151 mg/dL — ABNORMAL HIGH (ref 65–99)
HEMATOCRIT: 41 % (ref 36.0–46.0)
Hemoglobin: 13.9 g/dL (ref 12.0–15.0)
Potassium: 4.2 mmol/L (ref 3.5–5.1)
SODIUM: 142 mmol/L (ref 135–145)
TCO2: 23 mmol/L (ref 0–100)

## 2016-01-30 LAB — URINE MICROSCOPIC-ADD ON

## 2016-01-30 LAB — URINALYSIS, ROUTINE W REFLEX MICROSCOPIC
BILIRUBIN URINE: NEGATIVE
Glucose, UA: NEGATIVE mg/dL
Ketones, ur: NEGATIVE mg/dL
Nitrite: NEGATIVE
Specific Gravity, Urine: >1.03 — ABNORMAL HIGH (ref 1.005–1.030)
pH: 5.5 (ref 5.0–8.0)

## 2016-01-30 IMAGING — CT CT RENAL STONE PROTOCOL
2 of 4 series · 15 of 46 positions shown, 17 images · non-contrast
Comparison: [DATE].

CLINICAL DATA: 64-year-old female with history of left-sided flank
pain which was acute in onset at 3 p.m. today. History of kidney
stones with similar symptoms in the past.

EXAM:
CT ABDOMEN AND PELVIS WITHOUT CONTRAST
TECHNIQUE: Multidetector CT imaging of the abdomen and pelvis was performed
following the standard protocol without IV contrast.

[Series 2: standard/full over (age)lbs 5.0 · axial · 0.60mm/px · z∈[+616,+966]mm · 12 of 78 slices shown, 14 images]
[im 4/78  soft-tissue]
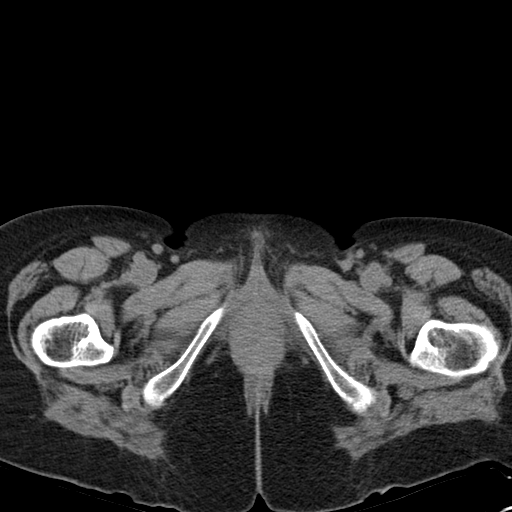
[im 4/78  bone]
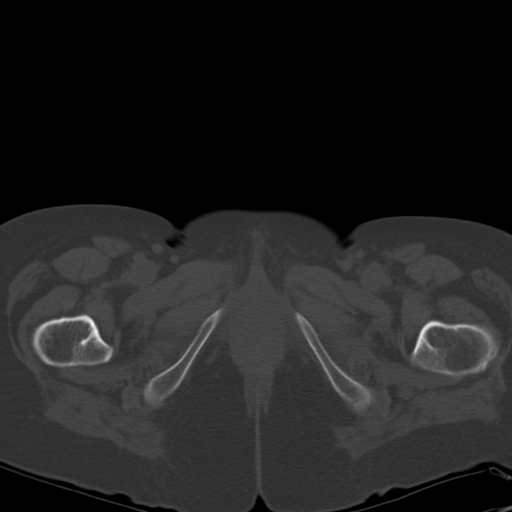
[im 10/78  soft-tissue]
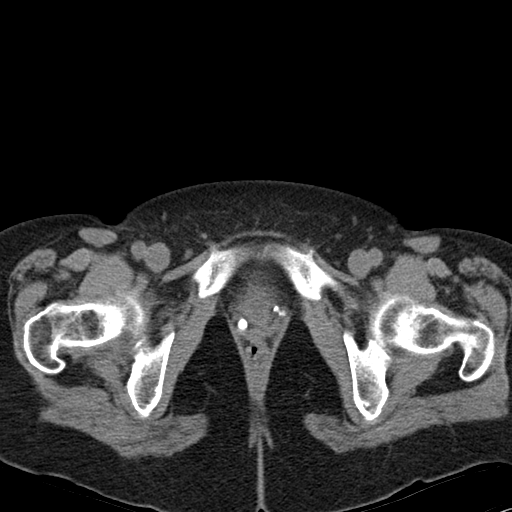
[im 17/78  soft-tissue]
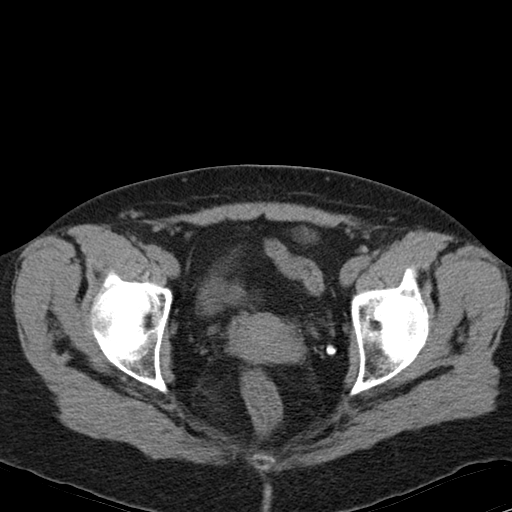
[im 23/78  soft-tissue]
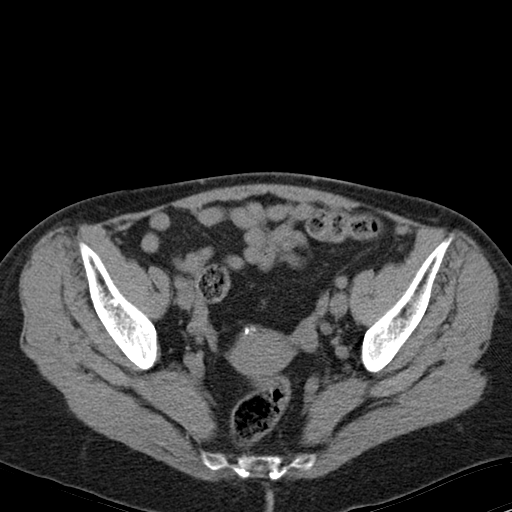
[im 29/78  soft-tissue]
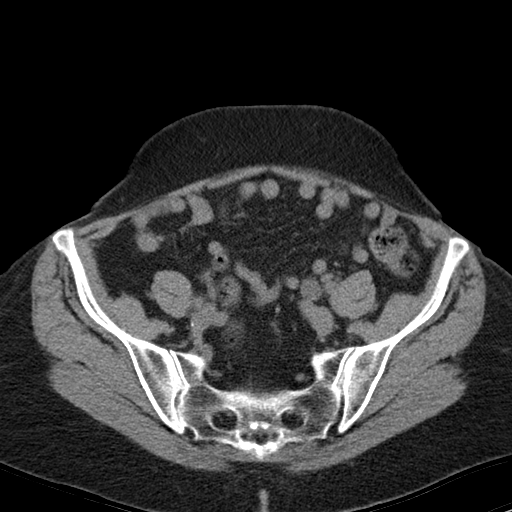
[im 36/78  soft-tissue]
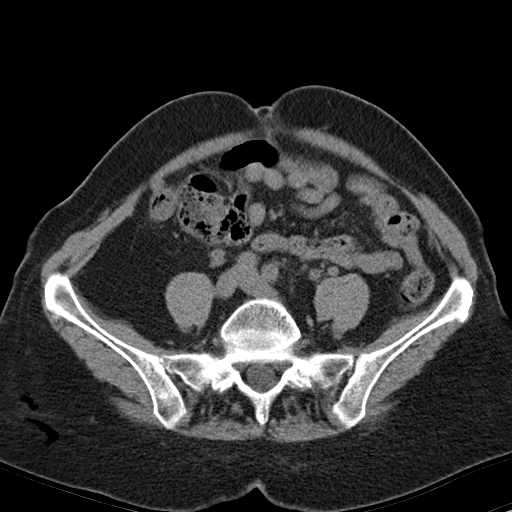
[im 42/78  soft-tissue]
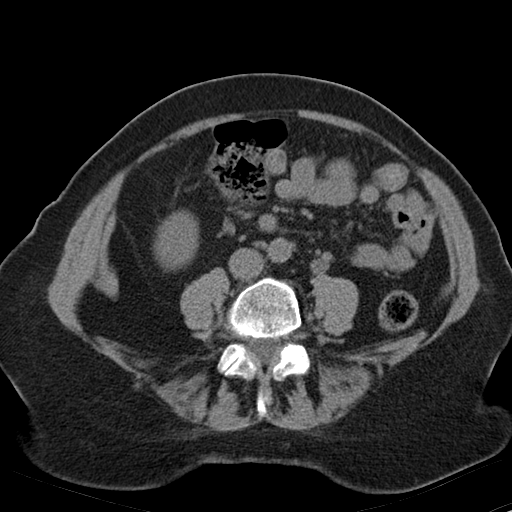
[im 49/78  soft-tissue]
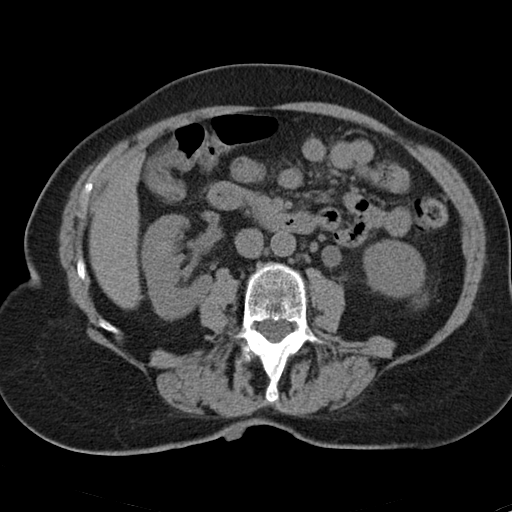
[im 55/78  soft-tissue]
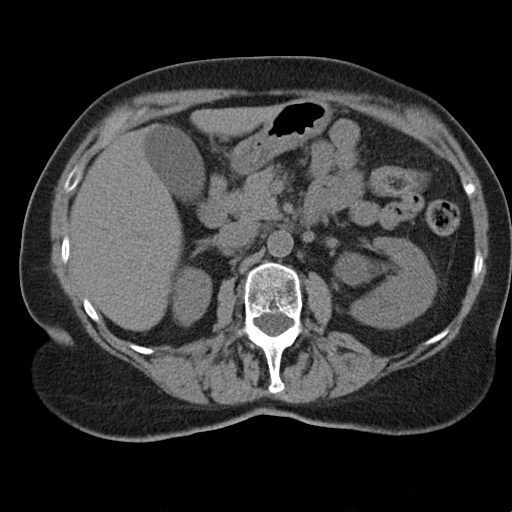
[im 55/78  bone]
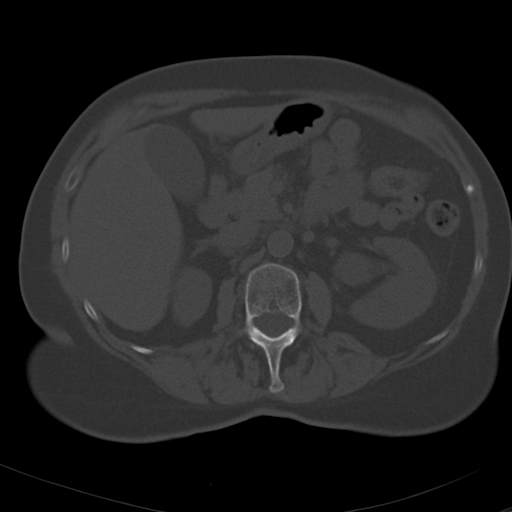
[im 61/78  soft-tissue]
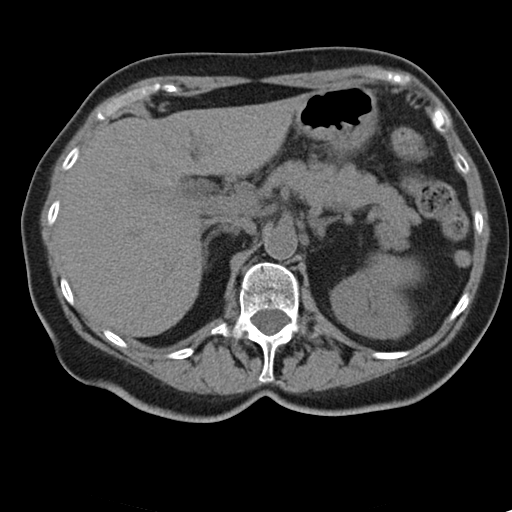
[im 68/78  soft-tissue]
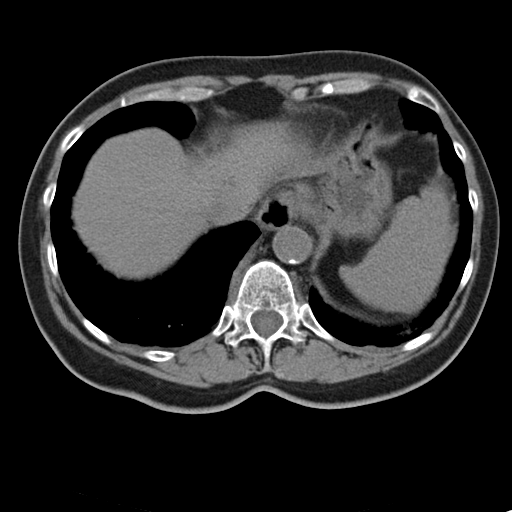
[im 74/78  soft-tissue]
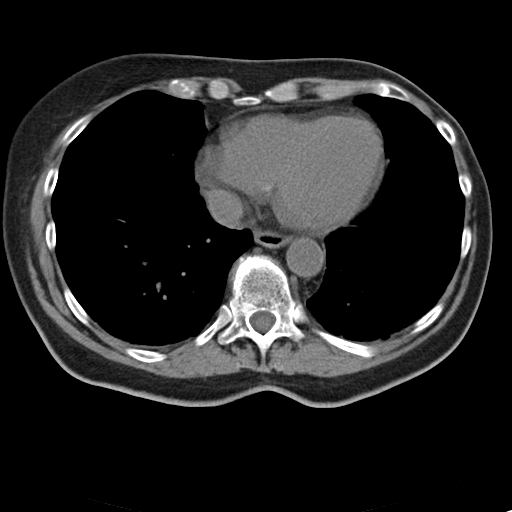

[Series 3: mpr coronal · coronal · 0.64mm/px · 3 of 97 slices shown]
[im 33/97  soft-tissue]
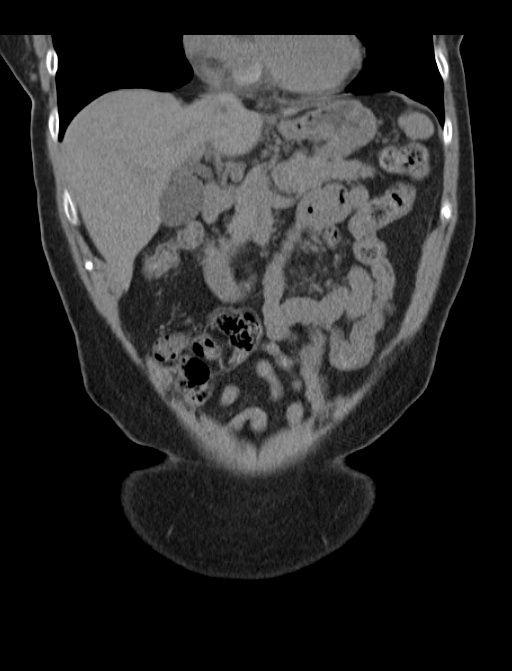
[im 43/97  soft-tissue]
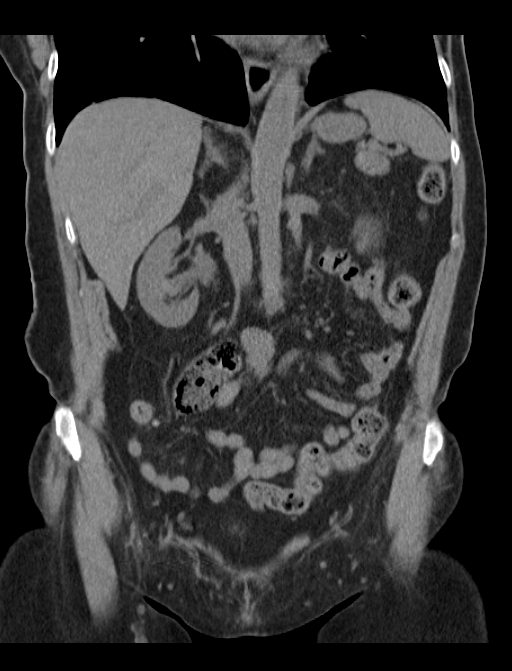
[im 54/97  soft-tissue]
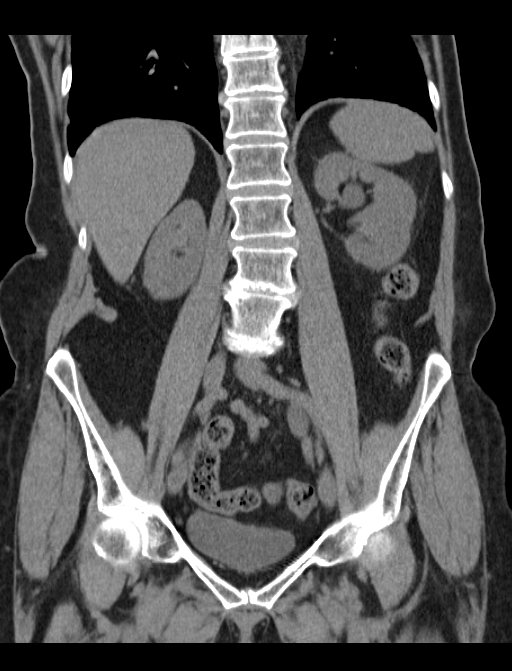

[15 of 46 positions shown; findings below may reference images not displayed]

FINDINGS: Lower chest:  Small hiatal hernia.

Hepatobiliary: 2 low-attenuation lesions in the left lobe of the
liver, incompletely characterized on today's noncontrast CT
examination, but similar to the prior study, presumably cysts,
largest of which measures 1.3 cm in segment 3. Calcified gallstones
lying dependently in the gallbladder. No current findings to suggest
an acute cholecystitis at this time.

Pancreas: No pancreatic mass or peripancreatic inflammatory changes
on today's noncontrast CT examination.

Spleen: Unremarkable.

Adrenals/Urinary Tract: 2 mm calculus in the distal third of the
left ureter (image 57 of series 2). There is an additional 5 mm
calculus in the distal third of the left ureter shortly before the
left ureterovesicular junction (axial image 64 of series 2 and
coronal image 60 of series 3). Additionally, there is a 6 mm
calculus in the distal right ureter immediately before the right
ureterovesicular junction (image 63 of series 2). Additional 1-2 mm
nonobstructive calculus in the lower pole collecting system of left
kidney. No bladder stones are identified. At this time, there is
mild to moderate left hydroureteronephrosis indicative of at least
partial obstruction. No significant right-sided hydronephrosis is
noted.

Stomach/Bowel: Normal appearance of the stomach. There is no
pathologic dilatation of small bowel or colon. Mild colonic
diverticulosis in the mid sigmoid colon, without surrounding
inflammatory changes. The appendix is not confidently identified may
be surgically absent. Regardless, there are no inflammatory changes
noted adjacent to the cecum to suggest the presence of an acute
appendicitis at this time.

Vascular/Lymphatic: Mild atherosclerotic calcifications identified
in the abdominal vasculature. No definite aneurysm identified in the
abdominal or pelvic vasculature on today's noncontrast CT
examination. No lymphadenopathy noted in the abdomen or pelvis.

Reproductive: Uterus and ovaries are unremarkable in appearance.

Other: No significant volume of ascites.  No pneumoperitoneum.

Musculoskeletal: There are no aggressive appearing lytic or blastic
lesions noted in the visualized portions of the skeleton.
IMPRESSION: 1. There are bilateral ureteral stones in the distal third of the
ureters (2 on the left and 1 on the right), as detailed above. The
left-sided stones appear at least partially obstructive, as
evidenced by mild to moderate proximal left hydroureteronephrosis.
At this time there is no right-sided hydroureteronephrosis to
indicate obstruction. There is an additional 1-2 mm nonobstructive
calculus in the lower pole collecting system of the left kidney.
2. Cholelithiasis without evidence of acute cholecystitis at this
time.
3. Mild colonic diverticulosis without evidence to suggest an acute
diverticulitis at this time.
4. Additional incidental findings, as above.

## 2016-01-30 MED ORDER — SODIUM CHLORIDE 0.9 % IV SOLN
INTRAVENOUS | Status: DC
Start: 2016-01-30 — End: 2016-01-31
  Administered 2016-01-30: 23:00:00 via INTRAVENOUS

## 2016-01-30 MED ORDER — ONDANSETRON HCL 4 MG PO TABS: 4.0000 mg | ORAL_TABLET | Freq: Three times a day (TID) | ORAL | Status: DC | PRN

## 2016-01-30 MED ORDER — ONDANSETRON 8 MG PO TBDP
8.0000 mg | ORAL_TABLET | Freq: Once | ORAL | Status: AC
  Administered 2016-01-30: 8 mg via ORAL
  Filled 2016-01-30: qty 1

## 2016-01-30 MED ORDER — HYDROMORPHONE HCL 1 MG/ML IJ SOLN
1.0000 mg | Freq: Once | INTRAMUSCULAR | Status: AC
  Administered 2016-01-30: 1 mg via INTRAMUSCULAR
  Filled 2016-01-30: qty 1

## 2016-01-30 MED ORDER — HYDROCODONE-ACETAMINOPHEN 5-325 MG PO TABS: ORAL_TABLET | ORAL | Status: DC

## 2016-01-30 NOTE — ED Notes (Signed)
Assisted pt to bathroom

## 2016-01-30 NOTE — ED Notes (Signed)
Attempted to call report to [facility] no answer.

## 2016-01-30 NOTE — ED Notes (Signed)
Pt states she began having left flank pain suddenly around 3pm.  Hx of kidney stones and feels similar.

## 2016-01-30 NOTE — ED Provider Notes (Signed)
CSN: [id number]     Arrival date & time 01/30/16  1824 History   First MD Initiated Contact with Patient 01/30/16 1900     Chief Complaint  Patient presents with  . Flank Pain      HPI Pt was seen at 1905. Per pt, c/o sudden onset and persistence of waxing and waning left sided flank "pain" that began this afternoon approximately 1500. Pt describes the pain as "like my last kidney stone," and radiating into the left side of her abd.  Has been associated with multiple intermittent episodes of N/V.  Denies dysuria/hematuria, no abd pain, no diarrhea, no black or blood in emesis, no CP/SOB, no fevers, no rash.    Uro: Dr. Tresa Moore ([facility]) Past Medical History  Diagnosis Date  . Osteoporosis   . Renal calculus, bilateral   . Frequency of urination   . Urgency of urination   . Hematuria   . History of kidney stones   . Dysuria   . Arthritis   . Wears glasses   . Cancer ([address])     melanoma   Past Surgical History  Procedure Laterality Date  . Cystoscopy with retrograde pyelogram, ureteroscopy and stent placement Bilateral 08/11/2013    Procedure: [place], URETEROSCOPY AND STENT PLACEMENT;  Surgeon: Alexis Frock, MD;  Location: [facility];  Service: Urology;  Laterality: Bilateral;  . Hemiarthroplasty shoulder fracture Left 1992  . Cystoscopy with ureteroscopy and stent placement Bilateral 08/29/2013    Procedure: CYSTOSCOPY WITH BILATERAL URETEROSCOPY AND STENT REPLACEMENTS, stone extraction;  Surgeon: Alexis Frock, MD;  Location: [facility];  Service: Urology;  Laterality: Bilateral;  . Holmium laser application Bilateral [id number]    Procedure: HOLMIUM LASER APPLICATION;  Surgeon: Alexis Frock, MD;  Location: [facility];  Service: Urology;  Laterality: Bilateral;   Family History  Problem Relation Age of Onset  . Cancer Mother   . Atrial fibrillation Father   . Hyperlipidemia Father   . Hypertension Father   .  Hypertension Sister   . Hyperlipidemia Sister   . Thyroid disease Sister    Social History  Substance Use Topics  . Smoking status: Never Smoker   . Smokeless tobacco: Never Used  . Alcohol Use: No    Review of Systems ROS: Statement: All systems negative except as marked or noted in the HPI; Constitutional: Negative for fever and chills. ; ; Eyes: Negative for eye pain, redness and discharge. ; ; ENMT: Negative for ear pain, hoarseness, nasal congestion, sinus pressure and sore throat. ; ; Cardiovascular: Negative for chest pain, palpitations, diaphoresis, dyspnea and peripheral edema. ; ; Respiratory: Negative for cough, wheezing and stridor. ; ; Gastrointestinal: +N/V. Negative for diarrhea, abdominal pain, blood in stool, hematemesis, jaundice and rectal bleeding. . ; ; Genitourinary: +flank pain. Negative for dysuria and hematuria. ; ; Musculoskeletal: Negative for back pain and neck pain. Negative for swelling and trauma.; ; Skin: Negative for pruritus, rash, abrasions, blisters, bruising and skin lesion.; ; Neuro: Negative for headache, lightheadedness and neck stiffness. Negative for weakness, altered level of consciousness, altered mental status, extremity weakness, paresthesias, involuntary movement, seizure and syncope.      Allergies  Sulfa antibiotics  Home Medications   Prior to Admission medications   Medication Sig Start Date End Date Taking? Authorizing Provider  alendronate (FOSAMAX) 70 MG tablet Take 1 tablet (70 mg total) by mouth every 7 (seven) days. Reported on 10/30/2015 10/31/15   Eustaquio Maize, MD  ibuprofen (  ADVIL,MOTRIN) 200 MG tablet Take 200-400 mg by mouth every 6 (six) hours as needed for moderate pain.    Historical Provider, MD  Multiple Vitamin (MULTIVITAMIN WITH MINERALS) TABS tablet Take 1 tablet by mouth daily.    Historical Provider, MD  potassium citrate (UROCIT-K) 10 MEQ (1080 MG) SR tablet Take 10 mEq by mouth 2 (two) times daily.     Historical  Provider, MD   BP 149/62 mmHg  Pulse 65  Temp(Src) 97.6 F (36.4 C) (Oral)  Resp 19  Ht 5\' 2"  (1.575 m)  Wt 135 lb (61.236 kg)  BMI 24.69 kg/m2  SpO2 100% Physical Exam  1910: Physical examination:  Nursing notes reviewed; Vital signs and O2 SAT reviewed;  Constitutional: Well developed, Well nourished, Well hydrated, Uncomfortable appearing.; Head:  Normocephalic, atraumatic; Eyes: EOMI, PERRL, No scleral icterus; ENMT: Mouth and pharynx normal, Mucous membranes moist; Neck: Supple, Full range of motion, No lymphadenopathy; Cardiovascular: Regular rate and rhythm, No gallop; Respiratory: Breath sounds clear & equal bilaterally, No wheezes.  Speaking full sentences with ease, Normal respiratory effort/excursion; Chest: Nontender, Movement normal; Abdomen: Soft, Nontender, Nondistended, Normal bowel sounds; Genitourinary: No CVA tenderness; Spine:  No midline CS, TS, LS tenderness.;; Extremities: Pulses normal, No tenderness, No edema, No calf edema or asymmetry.; Neuro: AA&Ox3, Major CN grossly intact.  Speech clear. No gross focal motor or sensory deficits in extremities. Climbs on and off stretcher easily by herself. Gait steady.; Skin: Color normal, Warm, Dry.   ED Course  Procedures (including critical care time) Labs Review  Imaging Review  I have personally reviewed and evaluated these images and lab results as part of my medical decision-making.   EKG Interpretation None      MDM  MDM Reviewed: previous chart, nursing note and vitals Reviewed previous: labs Interpretation: labs and CT scan     Results for orders placed or performed during the hospital encounter of 01/30/16  Urinalysis, Routine w reflex microscopic  Result Value Ref Range   Color, Urine YELLOW YELLOW   APPearance CLEAR CLEAR   Specific Gravity, Urine >1.030 (H) 1.005 - 1.030   pH 5.5 5.0 - 8.0   Glucose, UA NEGATIVE NEGATIVE mg/dL   Hgb urine dipstick LARGE (A) NEGATIVE   Bilirubin Urine NEGATIVE  NEGATIVE   Ketones, ur NEGATIVE NEGATIVE mg/dL   Protein, ur TRACE (A) NEGATIVE mg/dL   Nitrite NEGATIVE NEGATIVE   Leukocytes, UA TRACE (A) NEGATIVE  Urine microscopic-add on  Result Value Ref Range   Squamous Epithelial / LPF 0-5 (A) NONE SEEN   WBC, UA 0-5 0 - 5 WBC/hpf   RBC / HPF TOO NUMEROUS TO COUNT 0 - 5 RBC/hpf   Bacteria, UA FEW (A) NONE SEEN  CBC with Differential  Result Value Ref Range   WBC 9.5 4.0 - 10.5 K/uL   RBC 4.48 3.87 - 5.11 MIL/uL   Hemoglobin 11.9 (L) 12.0 - 15.0 g/dL   HCT 38.0 36.0 - 46.0 %   MCV 84.8 78.0 - 100.0 fL   MCH 26.6 26.0 - 34.0 pg   MCHC 31.3 30.0 - 36.0 g/dL   RDW 13.9 11.5 - 15.5 %   Platelets 204 150 - 400 K/uL   Neutrophils Relative % 90 %   Neutro Abs 8.5 (H) 1.7 - 7.7 K/uL   Lymphocytes Relative 7 %   Lymphs Abs 0.7 0.7 - 4.0 K/uL   Monocytes Relative 3 %   Monocytes Absolute 0.3 0.1 - 1.0 K/uL   Eosinophils Relative  0 %   Eosinophils Absolute 0.0 0.0 - 0.7 K/uL   Basophils Relative 0 %   Basophils Absolute 0.0 0.0 - 0.1 K/uL  I-stat Chem 8, ED  Result Value Ref Range   Sodium 142 135 - 145 mmol/L   Potassium 4.2 3.5 - 5.1 mmol/L   Chloride 107 101 - 111 mmol/L   BUN 20 6 - 20 mg/dL   Creatinine, Ser 0.70 0.44 - 1.00 mg/dL   Glucose, Bld 151 (H) 65 - 99 mg/dL   Calcium, Ion 1.08 (L) 1.13 - 1.30 mmol/L   TCO2 23 0 - 100 mmol/L   Hemoglobin 13.9 12.0 - 15.0 g/dL   HCT 41.0 36.0 - 46.0 %   Ct Renal Stone Study 01/30/2016  CLINICAL DATA:  65 year old female with history of left-sided flank pain which was acute in onset at 3 p.m. today. History of kidney stones with similar symptoms in the past. EXAM: CT ABDOMEN AND PELVIS WITHOUT CONTRAST TECHNIQUE: Multidetector CT imaging of the abdomen and pelvis was performed following the standard protocol without IV contrast. COMPARISON:  07/04/2014. FINDINGS: Lower chest:  Small hiatal hernia. Hepatobiliary: 2 low-attenuation lesions in the left lobe of the liver, incompletely characterized  on today's noncontrast CT examination, but similar to the prior study, presumably cysts, largest of which measures 1.3 cm in segment 3. Calcified gallstones lying dependently in the gallbladder. No current findings to suggest an acute cholecystitis at this time. Pancreas: No pancreatic mass or peripancreatic inflammatory changes on today's noncontrast CT examination. Spleen: Unremarkable. Adrenals/Urinary Tract: 2 mm calculus in the distal third of the left ureter (image 57 of series 2). There is an additional 5 mm calculus in the distal third of the left ureter shortly before the left ureterovesicular junction (axial image 64 of series 2 and coronal image 60 of series 3). Additionally, there is a 6 mm calculus in the distal right ureter immediately before the right ureterovesicular junction (image 63 of series 2). Additional 1-2 mm nonobstructive calculus in the lower pole collecting system of left kidney. No bladder stones are identified. At this time, there is mild to moderate left hydroureteronephrosis indicative of at least partial obstruction. No significant right-sided hydronephrosis is noted. Stomach/Bowel: Normal appearance of the stomach. There is no pathologic dilatation of small bowel or colon. Mild colonic diverticulosis in the mid sigmoid colon, without surrounding inflammatory changes. The appendix is not confidently identified may be surgically absent. Regardless, there are no inflammatory changes noted adjacent to the cecum to suggest the presence of an acute appendicitis at this time. Vascular/Lymphatic: Mild atherosclerotic calcifications identified in the abdominal vasculature. No definite aneurysm identified in the abdominal or pelvic vasculature on today's noncontrast CT examination. No lymphadenopathy noted in the abdomen or pelvis. Reproductive: Uterus and ovaries are unremarkable in appearance. Other: No significant volume of ascites.  No pneumoperitoneum. Musculoskeletal: There are no  aggressive appearing lytic or blastic lesions noted in the visualized portions of the skeleton. IMPRESSION: 1. There are bilateral ureteral stones in the distal third of the ureters (2 on the left and 1 on the right), as detailed above. The left-sided stones appear at least partially obstructive, as evidenced by mild to moderate proximal left hydroureteronephrosis. At this time there is no right-sided hydroureteronephrosis to indicate obstruction. There is an additional 1-2 mm nonobstructive calculus in the lower pole collecting system of the left kidney. 2. Cholelithiasis without evidence of acute cholecystitis at this time. 3. Mild colonic diverticulosis without evidence to suggest an acute diverticulitis  at this time. 4. Additional incidental findings, as above. Electronically Signed   By: Vinnie Langton M.D.   On: 01/30/2016 21:09    2130:  Pt feels "much better now" after meds. No UTI on Udip; UC pending. CT with multiple and biliateral ureteral calculi. T/C to Uro Dr. Alinda Money, case discussed, including:  HPI, pertinent PM/SHx, VS/PE, dx testing, ED course and treatment:  Requests to keep pt NPO, start IVF, and send pt to [facility] ED so he can place stents tonight. Dx and testing, as well as d/w Uro MD, d/w pt and family.  Questions answered.  Verb understanding, agreeable to transfer to [facility] ED to see Dr. Alinda Money.  [employer] consultant called and aware of pt's arrival.      Francine Graven, DO 01/31/16 [id number]

## 2016-01-30 NOTE — ED Notes (Signed)
Called operator at [facility] and he attempted to call [facility] operator still no answer from the ED

## 2016-01-31 ENCOUNTER — Encounter (HOSPITAL_COMMUNITY)
Admission: EM | Disposition: A | Discharge status: Home / Self Care | Payer: Self-pay | Source: Home / Self Care | Attending: Emergency Medicine

## 2016-01-31 ENCOUNTER — Encounter (HOSPITAL_COMMUNITY): Payer: Self-pay | Admitting: Certified Registered Nurse Anesthetist

## 2016-01-31 ENCOUNTER — Emergency Department (HOSPITAL_COMMUNITY): Payer: Medicare Other | Admitting: Certified Registered Nurse Anesthetist

## 2016-01-31 DIAGNOSIS — R279 Unspecified lack of coordination: Secondary | ICD-10-CM | POA: Diagnosis not present

## 2016-01-31 DIAGNOSIS — Z87442 Personal history of urinary calculi: Secondary | ICD-10-CM | POA: Diagnosis not present

## 2016-01-31 DIAGNOSIS — Z791 Long term (current) use of non-steroidal anti-inflammatories (NSAID): Secondary | ICD-10-CM | POA: Diagnosis not present

## 2016-01-31 DIAGNOSIS — R1031 Right lower quadrant pain: Secondary | ICD-10-CM | POA: Diagnosis not present

## 2016-01-31 DIAGNOSIS — M1990 Unspecified osteoarthritis, unspecified site: Secondary | ICD-10-CM | POA: Diagnosis not present

## 2016-01-31 DIAGNOSIS — N201 Calculus of ureter: Secondary | ICD-10-CM | POA: Diagnosis not present

## 2016-01-31 DIAGNOSIS — Z7401 Bed confinement status: Secondary | ICD-10-CM | POA: Diagnosis not present

## 2016-01-31 DIAGNOSIS — N132 Hydronephrosis with renal and ureteral calculous obstruction: Secondary | ICD-10-CM | POA: Diagnosis not present

## 2016-01-31 HISTORY — PX: CYSTOSCOPY/RETROGRADE/URETEROSCOPY/STONE EXTRACTION WITH BASKET: SHX5317

## 2016-01-31 SURGERY — CYSTOSCOPY, WITH CALCULUS REMOVAL USING BASKET
Anesthesia: General | Site: Ureter | Laterality: Bilateral

## 2016-01-31 MED ORDER — SODIUM CHLORIDE 0.9 % IR SOLN
Status: DC | PRN
  Administered 2016-01-31: 1000 mL

## 2016-01-31 MED ORDER — DIATRIZOATE MEGLUMINE 30 % UR SOLN
URETHRAL | Status: AC
  Filled 2016-01-31: qty 100

## 2016-01-31 MED ORDER — ONDANSETRON HCL 4 MG/2ML IJ SOLN
INTRAMUSCULAR | Status: DC | PRN
  Administered 2016-01-31: 4 mg via INTRAVENOUS

## 2016-01-31 MED ORDER — KCL IN DEXTROSE-NACL 20-5-0.45 MEQ/L-%-% IV SOLN
INTRAVENOUS | Status: DC
  Administered 2016-01-31: 05:00:00 via INTRAVENOUS
  Filled 2016-01-31: qty 1000

## 2016-01-31 MED ORDER — EPHEDRINE SULFATE 50 MG/ML IJ SOLN
INTRAMUSCULAR | Status: AC
  Filled 2016-01-31: qty 1

## 2016-01-31 MED ORDER — CIPROFLOXACIN IN D5W 400 MG/200ML IV SOLN
INTRAVENOUS | Status: DC | PRN
  Administered 2016-01-31: 400 mg via INTRAVENOUS

## 2016-01-31 MED ORDER — FENTANYL CITRATE (PF) 100 MCG/2ML IJ SOLN
INTRAMUSCULAR | Status: AC
  Filled 2016-01-31: qty 2

## 2016-01-31 MED ORDER — CIPROFLOXACIN IN D5W 400 MG/200ML IV SOLN
INTRAVENOUS | Status: AC
  Filled 2016-01-31: qty 200

## 2016-01-31 MED ORDER — MIDAZOLAM HCL 2 MG/2ML IJ SOLN
INTRAMUSCULAR | Status: AC
  Filled 2016-01-31: qty 2

## 2016-01-31 MED ORDER — ONDANSETRON HCL 4 MG/2ML IJ SOLN
INTRAMUSCULAR | Status: AC
  Filled 2016-01-31: qty 2

## 2016-01-31 MED ORDER — LIDOCAINE HCL (CARDIAC) 20 MG/ML IV SOLN
INTRAVENOUS | Status: AC
  Filled 2016-01-31: qty 5

## 2016-01-31 MED ORDER — PROPOFOL 10 MG/ML IV BOLUS
INTRAVENOUS | Status: DC | PRN
  Administered 2016-01-31: 200 mg via INTRAVENOUS

## 2016-01-31 MED ORDER — LIDOCAINE HCL (CARDIAC) 20 MG/ML IV SOLN
INTRAVENOUS | Status: DC | PRN
  Administered 2016-01-31: 100 mg via INTRAVENOUS

## 2016-01-31 MED ORDER — FENTANYL CITRATE (PF) 100 MCG/2ML IJ SOLN
INTRAMUSCULAR | Status: DC | PRN
  Administered 2016-01-31 (×2): 50 ug via INTRAVENOUS

## 2016-01-31 MED ORDER — LACTATED RINGERS IV SOLN
INTRAVENOUS | Status: DC | PRN
  Administered 2016-01-31: 08:00:00 via INTRAVENOUS

## 2016-01-31 MED ORDER — GLYCOPYRROLATE 0.2 MG/ML IJ SOLN
INTRAMUSCULAR | Status: AC
  Filled 2016-01-31: qty 1

## 2016-01-31 MED ORDER — MIDAZOLAM HCL 5 MG/5ML IJ SOLN
INTRAMUSCULAR | Status: DC | PRN
  Administered 2016-01-31: 2 mg via INTRAVENOUS

## 2016-01-31 MED ORDER — HYDROMORPHONE HCL 1 MG/ML IJ SOLN
1.0000 mg | INTRAMUSCULAR | Status: DC | PRN
  Filled 2016-01-31: qty 1

## 2016-01-31 MED ORDER — OXYCODONE-ACETAMINOPHEN 5-325 MG PO TABS: 1.0000 | ORAL_TABLET | ORAL | Status: DC | PRN

## 2016-01-31 MED ORDER — SODIUM CHLORIDE 0.9 % IJ SOLN
INTRAMUSCULAR | Status: AC
  Filled 2016-01-31: qty 10

## 2016-01-31 MED ORDER — DIATRIZOATE MEGLUMINE 30 % UR SOLN
URETHRAL | Status: DC | PRN
  Administered 2016-01-31: 300 mL via URETHRAL

## 2016-01-31 MED ORDER — PROPOFOL 10 MG/ML IV BOLUS
INTRAVENOUS | Status: AC
  Filled 2016-01-31: qty 20

## 2016-01-31 MED ORDER — SODIUM CHLORIDE 0.9 % IR SOLN
Status: DC | PRN
  Administered 2016-01-31: 3000 mL

## 2016-01-31 SURGICAL SUPPLY — 19 items
BAG URO CATCHER STRL LF (MISCELLANEOUS) ×3 IMPLANT
BASKET ZERO TIP NITINOL 2.4FR (BASKET) IMPLANT
BSKT STON RTRVL ZERO TP 2.4FR (BASKET)
CATH INTERMIT  6FR 70CM (CATHETERS) ×2 IMPLANT
CLOTH BEACON ORANGE TIMEOUT ST (SAFETY) ×3 IMPLANT
FIBER LASER FLEXIVA 365 (UROLOGICAL SUPPLIES) IMPLANT
FIBER LASER TRAC TIP (UROLOGICAL SUPPLIES) IMPLANT
GLOVE BIOGEL M STRL SZ7.5 (GLOVE) ×3 IMPLANT
GOWN STRL REUS W/TWL LRG LVL3 (GOWN DISPOSABLE) ×6 IMPLANT
GUIDEWIRE ANG ZIPWIRE 038X150 (WIRE) IMPLANT
GUIDEWIRE STR DUAL SENSOR (WIRE) ×3 IMPLANT
IV NS 1000ML (IV SOLUTION) ×3
IV NS 1000ML BAXH (IV SOLUTION) ×1 IMPLANT
MANIFOLD NEPTUNE II (INSTRUMENTS) ×3 IMPLANT
PACK CYSTO (CUSTOM PROCEDURE TRAY) ×3 IMPLANT
SHEATH ACCESS URETERAL 38CM (SHEATH) IMPLANT
STENT URET 6FRX24 CONTOUR (STENTS) ×2 IMPLANT
TUBING CONNECTING 10 (TUBING) ×2 IMPLANT
TUBING CONNECTING 10' (TUBING) ×1

## 2016-01-31 NOTE — Discharge Instructions (Signed)

## 2016-01-31 NOTE — Consult Note (Signed)
Urology Consult   Physician requesting consult:  Dr. Francine Graven   Reason for consult: Bilateral ureteral stones  History of Present Illness: [name] Page is a 65 y.o. female with a history of kidney stones followed by Dr. Tresa Moore.  She presented to the [facility] ED last evening with new onset, severe left lower quadrant abdominal pain.  Her pain was not controlled with po pain medication.  She underwent a CT scan that demonstrated two left distal ureteral calculi with left hydronephrosis and a 6 mm distal right ureteral calculus.  Her pain was able to be managed and she was not febrile.  However, since she had bilateral ureteral stones, I recommended she undergo intervention.  Considering that there was no anesthesiologist to staff [facility], the patient was transferred to the [facility] ED.    Past Medical History  Diagnosis Date  . Osteoporosis   . Renal calculus, bilateral   . Frequency of urination   . Urgency of urination   . Hematuria   . History of kidney stones   . Dysuria   . Arthritis   . Wears glasses   . Cancer ([place])     melanoma    Past Surgical History  Procedure Laterality Date  . Cystoscopy with retrograde pyelogram, ureteroscopy and stent placement Bilateral 08/11/2013    Procedure: [place], URETEROSCOPY AND STENT PLACEMENT;  Surgeon: Alexis Frock, MD;  Location: [facility];  Service: Urology;  Laterality: Bilateral;  . Hemiarthroplasty shoulder fracture Left 1992  . Cystoscopy with ureteroscopy and stent placement Bilateral 08/29/2013    Procedure: CYSTOSCOPY WITH BILATERAL URETEROSCOPY AND STENT REPLACEMENTS, stone extraction;  Surgeon: Alexis Frock, MD;  Location: [facility];  Service: Urology;  Laterality: Bilateral;  . Holmium laser application Bilateral [id number]    Procedure: HOLMIUM LASER APPLICATION;  Surgeon: Alexis Frock, MD;  Location: [facility];  Service: Urology;  Laterality: Bilateral;      Current Hospital Medications:  Home meds:    Medication List    ASK your doctor about these medications        alendronate 70 MG tablet  Commonly known as:  FOSAMAX  Take 1 tablet (70 mg total) by mouth every 7 (seven) days. Reported on 10/30/2015     [employer] DRY EYE RELIEF 0.2-0.2-1 % Soln  Generic drug:  Glycerin-Hypromellose-PEG 400  Apply 1 drop to eye daily as needed (for pain).     ibuprofen 200 MG tablet  Commonly known as:  ADVIL,MOTRIN  Take 200-400 mg by mouth every 6 (six) hours as needed for moderate pain.     multivitamin with minerals Tabs tablet  Take 1 tablet by mouth daily.     potassium citrate 10 MEQ (1080 MG) SR tablet  Commonly known as:  UROCIT-K  Take 10 mEq by mouth 2 (two) times daily.        Scheduled Meds:  Continuous Infusions: . sodium chloride 75 mL/hr at 01/30/16 2241   PRN Meds:.  Allergies:  Allergies  Allergen Reactions  . Sulfa Antibiotics Rash    Family History  Problem Relation Age of Onset  . Cancer Mother   . Atrial fibrillation Father   . Hyperlipidemia Father   . Hypertension Father   . Hypertension Sister   . Hyperlipidemia Sister   . Thyroid disease Sister     Social History:  reports that she has never smoked. She has never used smokeless tobacco. She reports that she does not drink alcohol  or use illicit drugs.  ROS: A complete review of systems was performed.  All systems are negative except for pertinent findings as noted.  Physical Exam:  Vital signs in last 24 hours: Temp:  [97.6 F (36.4 C)-98.2 F (36.8 C)] 98.1 F (36.7 C) (05/12 2337) Pulse Rate:  [65-91] 83 (05/13 0145) Resp:  [16-20] 18 (05/13 0114) BP: (129-175)/(62-109) 146/72 mmHg (05/13 0130) SpO2:  [94 %-100 %] 95 % (05/13 0145) Weight:  [61.236 kg (135 lb)] 61.236 kg (135 lb) (05/12 1827) Constitutional:  Alert and oriented, No acute distress Cardiovascular: Regular rate and rhythm, No JVD Respiratory: Normal respiratory effort, Lungs  clear bilaterally GI: Abdomen is soft, nontender, nondistended, no abdominal masses GU: Mild left CVAT Lymphatic: No lymphadenopathy Neurologic: Grossly intact, no focal deficits Psychiatric: Normal mood and affect  Laboratory Data:   Recent Labs  01/30/16 2221 01/30/16 2259  WBC 9.5  --   HGB 11.9* 13.9  HCT 38.0 41.0  PLT 204  --      Recent Labs  01/30/16 2259  NA 142  K 4.2  CL 107  GLUCOSE 151*  BUN 20  CREATININE 0.70     Results for orders placed or performed during the hospital encounter of 01/30/16 (from the past 24 hour(s))  Urinalysis, Routine w reflex microscopic     Status: Abnormal   Collection Time: 01/30/16  7:15 PM  Result Value Ref Range   Color, Urine YELLOW YELLOW   APPearance CLEAR CLEAR   Specific Gravity, Urine >1.030 (H) 1.005 - 1.030   pH 5.5 5.0 - 8.0   Glucose, UA NEGATIVE NEGATIVE mg/dL   Hgb urine dipstick LARGE (A) NEGATIVE   Bilirubin Urine NEGATIVE NEGATIVE   Ketones, ur NEGATIVE NEGATIVE mg/dL   Protein, ur TRACE (A) NEGATIVE mg/dL   Nitrite NEGATIVE NEGATIVE   Leukocytes, UA TRACE (A) NEGATIVE  Urine microscopic-add on     Status: Abnormal   Collection Time: 01/30/16  7:15 PM  Result Value Ref Range   Squamous Epithelial / LPF 0-5 (A) NONE SEEN   WBC, UA 0-5 0 - 5 WBC/hpf   RBC / HPF TOO NUMEROUS TO COUNT 0 - 5 RBC/hpf   Bacteria, UA FEW (A) NONE SEEN  CBC with Differential     Status: Abnormal   Collection Time: 01/30/16 10:21 PM  Result Value Ref Range   WBC 9.5 4.0 - 10.5 K/uL   RBC 4.48 3.87 - 5.11 MIL/uL   Hemoglobin 11.9 (L) 12.0 - 15.0 g/dL   HCT 38.0 36.0 - 46.0 %   MCV 84.8 78.0 - 100.0 fL   MCH 26.6 26.0 - 34.0 pg   MCHC 31.3 30.0 - 36.0 g/dL   RDW 13.9 11.5 - 15.5 %   Platelets 204 150 - 400 K/uL   Neutrophils Relative % 90 %   Neutro Abs 8.5 (H) 1.7 - 7.7 K/uL   Lymphocytes Relative 7 %   Lymphs Abs 0.7 0.7 - 4.0 K/uL   Monocytes Relative 3 %   Monocytes Absolute 0.3 0.1 - 1.0 K/uL   Eosinophils  Relative 0 %   Eosinophils Absolute 0.0 0.0 - 0.7 K/uL   Basophils Relative 0 %   Basophils Absolute 0.0 0.0 - 0.1 K/uL  I-stat Chem 8, ED     Status: Abnormal   Collection Time: 01/30/16 10:59 PM  Result Value Ref Range   Sodium 142 135 - 145 mmol/L   Potassium 4.2 3.5 - 5.1 mmol/L   Chloride 107 101 -  111 mmol/L   BUN 20 6 - 20 mg/dL   Creatinine, Ser 0.70 0.44 - 1.00 mg/dL   Glucose, Bld 151 (H) 65 - 99 mg/dL   Calcium, Ion 1.08 (L) 1.13 - 1.30 mmol/L   TCO2 23 0 - 100 mmol/L   Hemoglobin 13.9 12.0 - 15.0 g/dL   HCT 41.0 36.0 - 46.0 %   No results found for this or any previous visit (from the past 240 hour(s)).  Renal Function:  Recent Labs  01/30/16 2259  CREATININE 0.70   Estimated Creatinine Clearance: 60.3 mL/min (by C-G formula based on Cr of 0.7).  Radiologic Imaging: Ct Renal Stone Study  01/30/2016  CLINICAL DATA:  65 year old female with history of left-sided flank pain which was acute in onset at 3 p.m. today. History of kidney stones with similar symptoms in the past. EXAM: CT ABDOMEN AND PELVIS WITHOUT CONTRAST TECHNIQUE: Multidetector CT imaging of the abdomen and pelvis was performed following the standard protocol without IV contrast. COMPARISON:  07/04/2014. FINDINGS: Lower chest:  Small hiatal hernia. Hepatobiliary: 2 low-attenuation lesions in the left lobe of the liver, incompletely characterized on today's noncontrast CT examination, but similar to the prior study, presumably cysts, largest of which measures 1.3 cm in segment 3. Calcified gallstones lying dependently in the gallbladder. No current findings to suggest an acute cholecystitis at this time. Pancreas: No pancreatic mass or peripancreatic inflammatory changes on today's noncontrast CT examination. Spleen: Unremarkable. Adrenals/Urinary Tract: 2 mm calculus in the distal third of the left ureter (image 57 of series 2). There is an additional 5 mm calculus in the distal third of the left ureter shortly  before the left ureterovesicular junction (axial image 64 of series 2 and coronal image 60 of series 3). Additionally, there is a 6 mm calculus in the distal right ureter immediately before the right ureterovesicular junction (image 63 of series 2). Additional 1-2 mm nonobstructive calculus in the lower pole collecting system of left kidney. No bladder stones are identified. At this time, there is mild to moderate left hydroureteronephrosis indicative of at least partial obstruction. No significant right-sided hydronephrosis is noted. Stomach/Bowel: Normal appearance of the stomach. There is no pathologic dilatation of small bowel or colon. Mild colonic diverticulosis in the mid sigmoid colon, without surrounding inflammatory changes. The appendix is not confidently identified may be surgically absent. Regardless, there are no inflammatory changes noted adjacent to the cecum to suggest the presence of an acute appendicitis at this time. Vascular/Lymphatic: Mild atherosclerotic calcifications identified in the abdominal vasculature. No definite aneurysm identified in the abdominal or pelvic vasculature on today's noncontrast CT examination. No lymphadenopathy noted in the abdomen or pelvis. Reproductive: Uterus and ovaries are unremarkable in appearance. Other: No significant volume of ascites.  No pneumoperitoneum. Musculoskeletal: There are no aggressive appearing lytic or blastic lesions noted in the visualized portions of the skeleton. IMPRESSION: 1. There are bilateral ureteral stones in the distal third of the ureters (2 on the left and 1 on the right), as detailed above. The left-sided stones appear at least partially obstructive, as evidenced by mild to moderate proximal left hydroureteronephrosis. At this time there is no right-sided hydroureteronephrosis to indicate obstruction. There is an additional 1-2 mm nonobstructive calculus in the lower pole collecting system of the left kidney. 2. Cholelithiasis  without evidence of acute cholecystitis at this time. 3. Mild colonic diverticulosis without evidence to suggest an acute diverticulitis at this time. 4. Additional incidental findings, as above. Electronically Signed  By: Vinnie Langton M.D.   On: 01/30/2016 21:09    I independently reviewed the above imaging studies.  Impression/Recommendation: Bilateral ureteral calculi: I recommended proceeding with intervention due to the risk of developing renal failure. She will undergo cystoscopy, bilateral retrograde pyelography, possible bilateral ureteroscopy with laser lithotripsy and stent placement. I discussed the potential benefits and risks of the procedure, side effects of the proposed treatment, the likelihood of the patient achieving the goals of the procedure, and any potential problems that might occur during the procedure or recuperation. She gives informed consent to proceed.  Tyrese Ficek,LES 01/31/2016, 4:33 AM  Pryor Curia. MD   CC: Dr. Francine Graven

## 2016-01-31 NOTE — Anesthesia Procedure Notes (Signed)
Procedure Name: LMA Insertion Date/Time: 01/31/2016 8:36 AM Performed by: Maxwell Caul Pre-anesthesia Checklist: Patient identified, Emergency Drugs available, Suction available and Patient being monitored Patient Re-evaluated:Patient Re-evaluated prior to inductionOxygen Delivery Method: Circle system utilized Preoxygenation: Pre-oxygenation with 100% oxygen LMA: LMA inserted LMA Size: 4.0 Number of attempts: 1 Placement Confirmation: positive ETCO2 and breath sounds checked- equal and bilateral Tube secured with: Tape Dental Injury: Teeth and Oropharynx as per pre-operative assessment

## 2016-01-31 NOTE — ED Notes (Signed)
Bed: [id number] Expected date:  Expected time:  Means of arrival:  Comments: Transfer from [facility]

## 2016-01-31 NOTE — ED Notes (Signed)
TRANSFER TO OR

## 2016-01-31 NOTE — Transfer of Care (Signed)
Immediate Anesthesia Transfer of Care Note  Patient: [name] Page  Procedure(s) Performed: Procedure(s): CYSTOSCOPY/BILATERAL RETROGRADE PYELOGRAMS/BILATERAL URETEROSCOPY/BILATERAL STONE EXTRACTION WITH BASKET/RIGHT URETERAL STENT PLACEMENT (Bilateral)  Patient Location: PACU  Anesthesia Type:General  Level of Consciousness:  sedated, patient cooperative and responds to stimulation  Airway & Oxygen Therapy:Patient Spontanous Breathing and Patient connected to face mask oxgen  Post-op Assessment:  Report given to PACU RN and Post -op Vital signs reviewed and stable  Post vital signs:  Reviewed and stable  Last Vitals:  Filed Vitals:   01/31/16 0145 01/31/16 0736  BP:    Pulse: 83 84  Temp:  37.1 C  Resp:  16    Complications: No apparent anesthesia complications

## 2016-01-31 NOTE — Op Note (Signed)
Preoperative diagnosis: Bilateral ureteral calculi  Postoperative diagnosis: Bilateral ureteral calculi  Procedure:  1. Cystoscopy 2. Bilateral ureteroscopy and stone removal 3. Right ureteral stent placement (6 x 24 with string) 4. Bilateral retrograde pyelography with interpretation  Surgeon: Pryor Curia. M.D.  Anesthesia: General  Complications: None  Intraoperative findings: Bilateral retrograde pyelography was performed using a 6 Fr ureteral catheter and omnipaque contrast and demonstrated a filling defect within the distal right and left ureters consistent with the patient's known calculi without other abnormalities.  EBL: Minimal  Specimens: 1. Bilateral ureteral calculi  Disposition of specimens: [facility] for stone analysis  Indication: [name] Page is a 65 y.o. year old patient with urolithiasis.  She presented to the ED with bilateral ureteral calculi.  After reviewing the management options for treatment, the patient elected to proceed with the above surgical procedure(s). We have discussed the potential benefits and risks of the procedure, side effects of the proposed treatment, the likelihood of the patient achieving the goals of the procedure, and any potential problems that might occur during the procedure or recuperation. Informed consent has been obtained.  Description of procedure:  The patient was taken to the operating room and general anesthesia was induced.  The patient was placed in the dorsal lithotomy position, prepped and draped in the usual sterile fashion, and preoperative antibiotics were administered. A preoperative time-out was performed.   Cystourethroscopy was performed.  The patient's urethra was examined and was normal. The bladder was then systematically examined in its entirety. There was no evidence for any bladder tumors, stones, or other mucosal pathology.    Attention then turned to the left ureteral orifice and  a ureteral catheter was used to intubate the ureteral orifice.  Omnipaque contrast was injected through the ureteral catheter and a retrograde pyelogram was performed with findings as dictated above.  A 0.38 sensor guidewire was then advanced up the left ureter into the renal pelvis under fluoroscopic guidance. The 6 Fr semirigid ureteroscope was then advanced into the ureter next to the guidewire and the calculus was identified.  All stones were then removed from the ureter with a zero tip nitinol basket.  Reinspection of the ureter revealed no remaining visible stones or fragments.   Attention then turned to the right ureteral orifice and a ureteral catheter was used to intubate the ureteral orifice.  Omnipaque contrast was injected through the ureteral catheter and a retrograde pyelogram was performed with findings as dictated above.  A 0.38 sensor guidewire was then advanced up the right ureter into the renal pelvis under fluoroscopic guidance. The 6 Fr semirigid ureteroscope was then advanced into the ureter next to the guidewire and the calculus was identified.  All stones were then removed from the ureter with a zero tip nitinol basket.  Reinspection of the ureter revealed no remaining visible stones or fragments.   While the left side did not demonstrate significant edema and a stent was not required, there was edema on the right side. The wire in the right ureteral orifice was then backloaded through the cystoscope and a ureteral stent was advance over the wire using Seldinger technique.  The stent was positioned appropriately under fluoroscopic and cystoscopic guidance.  The wire was then removed with an adequate stent curl noted in the renal pelvis as well as in the bladder.  The bladder was then emptied and the procedure ended.  The patient appeared to tolerate the procedure well and without complications.  The patient  was able to be awakened and transferred to the recovery unit in  satisfactory condition.

## 2016-01-31 NOTE — Anesthesia Preprocedure Evaluation (Addendum)
Anesthesia Evaluation  Patient identified by MRN, date of birth, ID band Patient awake    Reviewed: Allergy & Precautions, NPO status , Patient's Chart, lab work & pertinent test results  History of Anesthesia Complications Negative for: history of anesthetic complications  Airway Mallampati: I  TM Distance: >3 FB Neck ROM: Full    Dental  (+) Edentulous Upper, Edentulous Lower, Dental Advisory Given   Pulmonary neg pulmonary ROS,    Pulmonary exam normal        Cardiovascular negative cardio ROS Normal cardiovascular exam     Neuro/Psych negative neurological ROS  negative psych ROS   GI/Hepatic negative GI ROS, Neg liver ROS,   Endo/Other    Renal/GU Renal disease     Musculoskeletal   Abdominal   Peds  Hematology   Anesthesia Other Findings   Reproductive/Obstetrics                            Anesthesia Physical Anesthesia Plan  ASA: II  Anesthesia Plan: General   Post-op Pain Management:    Induction: Intravenous  Airway Management Planned: LMA  Additional Equipment:   Intra-op Plan:   Post-operative Plan: Extubation in OR  Informed Consent: I have reviewed the patients History and Physical, chart, labs and discussed the procedure including the risks, benefits and alternatives for the proposed anesthesia with the patient or authorized representative who has indicated his/her understanding and acceptance.   Dental advisory given  Plan Discussed with: CRNA, Anesthesiologist and Surgeon  Anesthesia Plan Comments:        Anesthesia Quick Evaluation

## 2016-01-31 NOTE — Anesthesia Postprocedure Evaluation (Signed)
Anesthesia Post Note  Patient: [name] Page  Procedure(s) Performed: Procedure(s) (LRB): CYSTOSCOPY/BILATERAL RETROGRADE PYELOGRAMS/BILATERAL URETEROSCOPY/BILATERAL STONE EXTRACTION WITH BASKET/RIGHT URETERAL STENT PLACEMENT (Bilateral)  Patient location during evaluation: PACU Anesthesia Type: General Level of consciousness: sedated Pain management: pain level controlled Vital Signs Assessment: post-procedure vital signs reviewed and stable Respiratory status: spontaneous breathing and respiratory function stable Cardiovascular status: stable Anesthetic complications: no    Last Vitals:  Filed Vitals:   01/31/16 0915 01/31/16 0930  BP: 152/75 142/78  Pulse: 76 77  Temp: 36.7 C   Resp: 12 15    Last Pain:  Filed Vitals:   01/31/16 0937  PainSc: 0-No pain                 Joslyn Ramos DANIEL

## 2016-01-31 NOTE — ED Provider Notes (Signed)
Patient transferred here for evaluation by urology for stent placement. She states that she is currently pain-free. She is resting comfortably and in no acute distress. Urology on-call has been paged.  Delora Fuel, MD [phone]

## 2016-02-02 ENCOUNTER — Encounter (HOSPITAL_COMMUNITY): Payer: Self-pay | Admitting: Urology

## 2016-02-02 DIAGNOSIS — N201 Calculus of ureter: Secondary | ICD-10-CM | POA: Diagnosis not present

## 2016-02-02 LAB — URINE CULTURE

## 2016-02-18 DIAGNOSIS — M5430 Sciatica, unspecified side: Secondary | ICD-10-CM | POA: Diagnosis not present

## 2016-02-18 DIAGNOSIS — M9903 Segmental and somatic dysfunction of lumbar region: Secondary | ICD-10-CM | POA: Diagnosis not present

## 2016-02-18 DIAGNOSIS — M9902 Segmental and somatic dysfunction of thoracic region: Secondary | ICD-10-CM | POA: Diagnosis not present

## 2016-02-18 DIAGNOSIS — M9901 Segmental and somatic dysfunction of cervical region: Secondary | ICD-10-CM | POA: Diagnosis not present

## 2016-02-18 DIAGNOSIS — M9904 Segmental and somatic dysfunction of sacral region: Secondary | ICD-10-CM | POA: Diagnosis not present

## 2016-02-18 DIAGNOSIS — M9905 Segmental and somatic dysfunction of pelvic region: Secondary | ICD-10-CM | POA: Diagnosis not present

## 2016-03-10 DIAGNOSIS — M9905 Segmental and somatic dysfunction of pelvic region: Secondary | ICD-10-CM | POA: Diagnosis not present

## 2016-03-10 DIAGNOSIS — M5430 Sciatica, unspecified side: Secondary | ICD-10-CM | POA: Diagnosis not present

## 2016-03-10 DIAGNOSIS — M9903 Segmental and somatic dysfunction of lumbar region: Secondary | ICD-10-CM | POA: Diagnosis not present

## 2016-03-10 DIAGNOSIS — M9901 Segmental and somatic dysfunction of cervical region: Secondary | ICD-10-CM | POA: Diagnosis not present

## 2016-03-10 DIAGNOSIS — M9904 Segmental and somatic dysfunction of sacral region: Secondary | ICD-10-CM | POA: Diagnosis not present

## 2016-03-10 DIAGNOSIS — M9902 Segmental and somatic dysfunction of thoracic region: Secondary | ICD-10-CM | POA: Diagnosis not present

## 2016-03-24 DIAGNOSIS — M5430 Sciatica, unspecified side: Secondary | ICD-10-CM | POA: Diagnosis not present

## 2016-03-24 DIAGNOSIS — M9901 Segmental and somatic dysfunction of cervical region: Secondary | ICD-10-CM | POA: Diagnosis not present

## 2016-03-24 DIAGNOSIS — M9904 Segmental and somatic dysfunction of sacral region: Secondary | ICD-10-CM | POA: Diagnosis not present

## 2016-03-24 DIAGNOSIS — M9905 Segmental and somatic dysfunction of pelvic region: Secondary | ICD-10-CM | POA: Diagnosis not present

## 2016-03-24 DIAGNOSIS — M9902 Segmental and somatic dysfunction of thoracic region: Secondary | ICD-10-CM | POA: Diagnosis not present

## 2016-03-24 DIAGNOSIS — M9903 Segmental and somatic dysfunction of lumbar region: Secondary | ICD-10-CM | POA: Diagnosis not present

## 2016-04-08 DIAGNOSIS — M9904 Segmental and somatic dysfunction of sacral region: Secondary | ICD-10-CM | POA: Diagnosis not present

## 2016-04-08 DIAGNOSIS — M9905 Segmental and somatic dysfunction of pelvic region: Secondary | ICD-10-CM | POA: Diagnosis not present

## 2016-04-08 DIAGNOSIS — M9902 Segmental and somatic dysfunction of thoracic region: Secondary | ICD-10-CM | POA: Diagnosis not present

## 2016-04-08 DIAGNOSIS — M5430 Sciatica, unspecified side: Secondary | ICD-10-CM | POA: Diagnosis not present

## 2016-04-08 DIAGNOSIS — M9901 Segmental and somatic dysfunction of cervical region: Secondary | ICD-10-CM | POA: Diagnosis not present

## 2016-04-08 DIAGNOSIS — M9903 Segmental and somatic dysfunction of lumbar region: Secondary | ICD-10-CM | POA: Diagnosis not present

## 2016-04-22 DIAGNOSIS — M5430 Sciatica, unspecified side: Secondary | ICD-10-CM | POA: Diagnosis not present

## 2016-04-22 DIAGNOSIS — M9903 Segmental and somatic dysfunction of lumbar region: Secondary | ICD-10-CM | POA: Diagnosis not present

## 2016-04-22 DIAGNOSIS — M9904 Segmental and somatic dysfunction of sacral region: Secondary | ICD-10-CM | POA: Diagnosis not present

## 2016-04-22 DIAGNOSIS — M9902 Segmental and somatic dysfunction of thoracic region: Secondary | ICD-10-CM | POA: Diagnosis not present

## 2016-04-22 DIAGNOSIS — M9901 Segmental and somatic dysfunction of cervical region: Secondary | ICD-10-CM | POA: Diagnosis not present

## 2016-04-22 DIAGNOSIS — M9905 Segmental and somatic dysfunction of pelvic region: Secondary | ICD-10-CM | POA: Diagnosis not present

## 2016-05-12 DIAGNOSIS — M9904 Segmental and somatic dysfunction of sacral region: Secondary | ICD-10-CM | POA: Diagnosis not present

## 2016-05-12 DIAGNOSIS — M9905 Segmental and somatic dysfunction of pelvic region: Secondary | ICD-10-CM | POA: Diagnosis not present

## 2016-05-12 DIAGNOSIS — M9903 Segmental and somatic dysfunction of lumbar region: Secondary | ICD-10-CM | POA: Diagnosis not present

## 2016-05-12 DIAGNOSIS — M5430 Sciatica, unspecified side: Secondary | ICD-10-CM | POA: Diagnosis not present

## 2016-05-12 DIAGNOSIS — M9901 Segmental and somatic dysfunction of cervical region: Secondary | ICD-10-CM | POA: Diagnosis not present

## 2016-05-12 DIAGNOSIS — M9902 Segmental and somatic dysfunction of thoracic region: Secondary | ICD-10-CM | POA: Diagnosis not present

## 2016-05-13 DIAGNOSIS — R34 Anuria and oliguria: Secondary | ICD-10-CM | POA: Diagnosis not present

## 2016-05-13 DIAGNOSIS — N200 Calculus of kidney: Secondary | ICD-10-CM | POA: Diagnosis not present

## 2016-05-13 DIAGNOSIS — R8299 Other abnormal findings in urine: Secondary | ICD-10-CM | POA: Diagnosis not present

## 2016-05-13 DIAGNOSIS — N281 Cyst of kidney, acquired: Secondary | ICD-10-CM | POA: Diagnosis not present

## 2016-05-26 DIAGNOSIS — M9903 Segmental and somatic dysfunction of lumbar region: Secondary | ICD-10-CM | POA: Diagnosis not present

## 2016-05-26 DIAGNOSIS — M9904 Segmental and somatic dysfunction of sacral region: Secondary | ICD-10-CM | POA: Diagnosis not present

## 2016-05-26 DIAGNOSIS — M9902 Segmental and somatic dysfunction of thoracic region: Secondary | ICD-10-CM | POA: Diagnosis not present

## 2016-05-26 DIAGNOSIS — M9901 Segmental and somatic dysfunction of cervical region: Secondary | ICD-10-CM | POA: Diagnosis not present

## 2016-05-26 DIAGNOSIS — M9905 Segmental and somatic dysfunction of pelvic region: Secondary | ICD-10-CM | POA: Diagnosis not present

## 2016-05-26 DIAGNOSIS — M5430 Sciatica, unspecified side: Secondary | ICD-10-CM | POA: Diagnosis not present

## 2016-06-30 DIAGNOSIS — M9902 Segmental and somatic dysfunction of thoracic region: Secondary | ICD-10-CM | POA: Diagnosis not present

## 2016-06-30 DIAGNOSIS — M9901 Segmental and somatic dysfunction of cervical region: Secondary | ICD-10-CM | POA: Diagnosis not present

## 2016-06-30 DIAGNOSIS — M9903 Segmental and somatic dysfunction of lumbar region: Secondary | ICD-10-CM | POA: Diagnosis not present

## 2016-06-30 DIAGNOSIS — M9904 Segmental and somatic dysfunction of sacral region: Secondary | ICD-10-CM | POA: Diagnosis not present

## 2016-06-30 DIAGNOSIS — M9905 Segmental and somatic dysfunction of pelvic region: Secondary | ICD-10-CM | POA: Diagnosis not present

## 2016-06-30 DIAGNOSIS — M5137 Other intervertebral disc degeneration, lumbosacral region: Secondary | ICD-10-CM | POA: Diagnosis not present

## 2016-07-09 ENCOUNTER — Other Ambulatory Visit: Payer: Self-pay | Admitting: Pediatrics

## 2016-07-09 DIAGNOSIS — M810 Age-related osteoporosis without current pathological fracture: Secondary | ICD-10-CM

## 2016-07-28 DIAGNOSIS — M9903 Segmental and somatic dysfunction of lumbar region: Secondary | ICD-10-CM | POA: Diagnosis not present

## 2016-07-28 DIAGNOSIS — M9902 Segmental and somatic dysfunction of thoracic region: Secondary | ICD-10-CM | POA: Diagnosis not present

## 2016-07-28 DIAGNOSIS — M5137 Other intervertebral disc degeneration, lumbosacral region: Secondary | ICD-10-CM | POA: Diagnosis not present

## 2016-07-28 DIAGNOSIS — M9905 Segmental and somatic dysfunction of pelvic region: Secondary | ICD-10-CM | POA: Diagnosis not present

## 2016-07-28 DIAGNOSIS — Z23 Encounter for immunization: Secondary | ICD-10-CM | POA: Diagnosis not present

## 2016-07-28 DIAGNOSIS — M9904 Segmental and somatic dysfunction of sacral region: Secondary | ICD-10-CM | POA: Diagnosis not present

## 2016-07-28 DIAGNOSIS — M9901 Segmental and somatic dysfunction of cervical region: Secondary | ICD-10-CM | POA: Diagnosis not present

## 2016-08-25 DIAGNOSIS — M9901 Segmental and somatic dysfunction of cervical region: Secondary | ICD-10-CM | POA: Diagnosis not present

## 2016-08-25 DIAGNOSIS — M5137 Other intervertebral disc degeneration, lumbosacral region: Secondary | ICD-10-CM | POA: Diagnosis not present

## 2016-08-25 DIAGNOSIS — M9902 Segmental and somatic dysfunction of thoracic region: Secondary | ICD-10-CM | POA: Diagnosis not present

## 2016-08-25 DIAGNOSIS — M9903 Segmental and somatic dysfunction of lumbar region: Secondary | ICD-10-CM | POA: Diagnosis not present

## 2016-08-25 DIAGNOSIS — M9904 Segmental and somatic dysfunction of sacral region: Secondary | ICD-10-CM | POA: Diagnosis not present

## 2016-08-25 DIAGNOSIS — M9905 Segmental and somatic dysfunction of pelvic region: Secondary | ICD-10-CM | POA: Diagnosis not present

## 2016-09-22 DIAGNOSIS — M9902 Segmental and somatic dysfunction of thoracic region: Secondary | ICD-10-CM | POA: Diagnosis not present

## 2016-09-22 DIAGNOSIS — M9905 Segmental and somatic dysfunction of pelvic region: Secondary | ICD-10-CM | POA: Diagnosis not present

## 2016-09-22 DIAGNOSIS — M9904 Segmental and somatic dysfunction of sacral region: Secondary | ICD-10-CM | POA: Diagnosis not present

## 2016-09-22 DIAGNOSIS — M9901 Segmental and somatic dysfunction of cervical region: Secondary | ICD-10-CM | POA: Diagnosis not present

## 2016-09-22 DIAGNOSIS — M9903 Segmental and somatic dysfunction of lumbar region: Secondary | ICD-10-CM | POA: Diagnosis not present

## 2016-09-22 DIAGNOSIS — M5137 Other intervertebral disc degeneration, lumbosacral region: Secondary | ICD-10-CM | POA: Diagnosis not present

## 2016-10-02 ENCOUNTER — Other Ambulatory Visit: Payer: Self-pay | Admitting: Pediatrics

## 2016-10-02 DIAGNOSIS — M810 Age-related osteoporosis without current pathological fracture: Secondary | ICD-10-CM

## 2016-10-07 ENCOUNTER — Other Ambulatory Visit: Payer: Self-pay | Admitting: Pediatrics

## 2016-10-07 DIAGNOSIS — M810 Age-related osteoporosis without current pathological fracture: Secondary | ICD-10-CM

## 2016-10-20 DIAGNOSIS — Z1231 Encounter for screening mammogram for malignant neoplasm of breast: Secondary | ICD-10-CM | POA: Diagnosis not present

## 2016-10-27 DIAGNOSIS — M9901 Segmental and somatic dysfunction of cervical region: Secondary | ICD-10-CM | POA: Diagnosis not present

## 2016-10-27 DIAGNOSIS — M9903 Segmental and somatic dysfunction of lumbar region: Secondary | ICD-10-CM | POA: Diagnosis not present

## 2016-10-27 DIAGNOSIS — M5030 Other cervical disc degeneration, unspecified cervical region: Secondary | ICD-10-CM | POA: Diagnosis not present

## 2016-10-27 DIAGNOSIS — M9905 Segmental and somatic dysfunction of pelvic region: Secondary | ICD-10-CM | POA: Diagnosis not present

## 2016-10-27 DIAGNOSIS — M9902 Segmental and somatic dysfunction of thoracic region: Secondary | ICD-10-CM | POA: Diagnosis not present

## 2016-10-27 DIAGNOSIS — M9904 Segmental and somatic dysfunction of sacral region: Secondary | ICD-10-CM | POA: Diagnosis not present

## 2016-11-24 DIAGNOSIS — M9902 Segmental and somatic dysfunction of thoracic region: Secondary | ICD-10-CM | POA: Diagnosis not present

## 2016-11-24 DIAGNOSIS — M5030 Other cervical disc degeneration, unspecified cervical region: Secondary | ICD-10-CM | POA: Diagnosis not present

## 2016-11-24 DIAGNOSIS — M9904 Segmental and somatic dysfunction of sacral region: Secondary | ICD-10-CM | POA: Diagnosis not present

## 2016-11-24 DIAGNOSIS — M9903 Segmental and somatic dysfunction of lumbar region: Secondary | ICD-10-CM | POA: Diagnosis not present

## 2016-11-24 DIAGNOSIS — M9905 Segmental and somatic dysfunction of pelvic region: Secondary | ICD-10-CM | POA: Diagnosis not present

## 2016-11-24 DIAGNOSIS — M9901 Segmental and somatic dysfunction of cervical region: Secondary | ICD-10-CM | POA: Diagnosis not present

## 2016-12-22 DIAGNOSIS — M9902 Segmental and somatic dysfunction of thoracic region: Secondary | ICD-10-CM | POA: Diagnosis not present

## 2016-12-22 DIAGNOSIS — M9904 Segmental and somatic dysfunction of sacral region: Secondary | ICD-10-CM | POA: Diagnosis not present

## 2016-12-22 DIAGNOSIS — M5030 Other cervical disc degeneration, unspecified cervical region: Secondary | ICD-10-CM | POA: Diagnosis not present

## 2016-12-22 DIAGNOSIS — M9905 Segmental and somatic dysfunction of pelvic region: Secondary | ICD-10-CM | POA: Diagnosis not present

## 2016-12-22 DIAGNOSIS — M9901 Segmental and somatic dysfunction of cervical region: Secondary | ICD-10-CM | POA: Diagnosis not present

## 2016-12-22 DIAGNOSIS — M9903 Segmental and somatic dysfunction of lumbar region: Secondary | ICD-10-CM | POA: Diagnosis not present

## 2017-01-01 ENCOUNTER — Other Ambulatory Visit: Payer: Self-pay | Admitting: Pediatrics

## 2017-01-01 DIAGNOSIS — M810 Age-related osteoporosis without current pathological fracture: Secondary | ICD-10-CM

## 2017-01-19 DIAGNOSIS — M5412 Radiculopathy, cervical region: Secondary | ICD-10-CM | POA: Diagnosis not present

## 2017-01-19 DIAGNOSIS — M9902 Segmental and somatic dysfunction of thoracic region: Secondary | ICD-10-CM | POA: Diagnosis not present

## 2017-01-19 DIAGNOSIS — M9903 Segmental and somatic dysfunction of lumbar region: Secondary | ICD-10-CM | POA: Diagnosis not present

## 2017-01-19 DIAGNOSIS — M9901 Segmental and somatic dysfunction of cervical region: Secondary | ICD-10-CM | POA: Diagnosis not present

## 2017-02-16 DIAGNOSIS — M9901 Segmental and somatic dysfunction of cervical region: Secondary | ICD-10-CM | POA: Diagnosis not present

## 2017-02-16 DIAGNOSIS — M5412 Radiculopathy, cervical region: Secondary | ICD-10-CM | POA: Diagnosis not present

## 2017-02-16 DIAGNOSIS — M9903 Segmental and somatic dysfunction of lumbar region: Secondary | ICD-10-CM | POA: Diagnosis not present

## 2017-02-16 DIAGNOSIS — M9902 Segmental and somatic dysfunction of thoracic region: Secondary | ICD-10-CM | POA: Diagnosis not present

## 2017-03-16 DIAGNOSIS — M9901 Segmental and somatic dysfunction of cervical region: Secondary | ICD-10-CM | POA: Diagnosis not present

## 2017-03-16 DIAGNOSIS — M9903 Segmental and somatic dysfunction of lumbar region: Secondary | ICD-10-CM | POA: Diagnosis not present

## 2017-03-16 DIAGNOSIS — M9902 Segmental and somatic dysfunction of thoracic region: Secondary | ICD-10-CM | POA: Diagnosis not present

## 2017-03-16 DIAGNOSIS — M5412 Radiculopathy, cervical region: Secondary | ICD-10-CM | POA: Diagnosis not present

## 2017-04-04 ENCOUNTER — Other Ambulatory Visit: Payer: Self-pay | Admitting: Pediatrics

## 2017-04-04 DIAGNOSIS — M810 Age-related osteoporosis without current pathological fracture: Secondary | ICD-10-CM

## 2017-05-10 DIAGNOSIS — N200 Calculus of kidney: Secondary | ICD-10-CM | POA: Diagnosis not present

## 2017-05-17 DIAGNOSIS — R8299 Other abnormal findings in urine: Secondary | ICD-10-CM | POA: Diagnosis not present

## 2017-05-17 DIAGNOSIS — R34 Anuria and oliguria: Secondary | ICD-10-CM | POA: Diagnosis not present

## 2017-05-17 DIAGNOSIS — N201 Calculus of ureter: Secondary | ICD-10-CM | POA: Diagnosis not present

## 2017-07-11 ENCOUNTER — Other Ambulatory Visit: Payer: Self-pay | Admitting: Pediatrics

## 2017-07-11 DIAGNOSIS — M810 Age-related osteoporosis without current pathological fracture: Secondary | ICD-10-CM

## 2017-07-12 NOTE — Telephone Encounter (Signed)
Last seen 2/18  Dr Evette Doffing

## 2017-07-16 DIAGNOSIS — Z23 Encounter for immunization: Secondary | ICD-10-CM | POA: Diagnosis not present

## 2017-10-08 ENCOUNTER — Other Ambulatory Visit: Payer: Self-pay | Admitting: Pediatrics

## 2017-10-08 DIAGNOSIS — M810 Age-related osteoporosis without current pathological fracture: Secondary | ICD-10-CM

## 2017-10-13 ENCOUNTER — Other Ambulatory Visit: Payer: Self-pay | Admitting: Pediatrics

## 2017-10-13 DIAGNOSIS — M810 Age-related osteoporosis without current pathological fracture: Secondary | ICD-10-CM

## 2017-10-24 DIAGNOSIS — M810 Age-related osteoporosis without current pathological fracture: Secondary | ICD-10-CM | POA: Diagnosis not present

## 2017-10-24 DIAGNOSIS — Z1231 Encounter for screening mammogram for malignant neoplasm of breast: Secondary | ICD-10-CM | POA: Diagnosis not present

## 2017-10-24 DIAGNOSIS — M8588 Other specified disorders of bone density and structure, other site: Secondary | ICD-10-CM | POA: Diagnosis not present

## 2018-02-06 DIAGNOSIS — R310 Gross hematuria: Secondary | ICD-10-CM | POA: Diagnosis not present

## 2018-02-06 DIAGNOSIS — R8279 Other abnormal findings on microbiological examination of urine: Secondary | ICD-10-CM | POA: Diagnosis not present

## 2018-02-06 DIAGNOSIS — K8020 Calculus of gallbladder without cholecystitis without obstruction: Secondary | ICD-10-CM | POA: Diagnosis not present

## 2018-02-06 DIAGNOSIS — N201 Calculus of ureter: Secondary | ICD-10-CM | POA: Diagnosis not present

## 2018-02-06 DIAGNOSIS — N132 Hydronephrosis with renal and ureteral calculous obstruction: Secondary | ICD-10-CM | POA: Diagnosis not present

## 2018-02-06 DIAGNOSIS — R300 Dysuria: Secondary | ICD-10-CM | POA: Diagnosis not present

## 2018-02-21 DIAGNOSIS — R8271 Bacteriuria: Secondary | ICD-10-CM | POA: Diagnosis not present

## 2018-02-21 DIAGNOSIS — N201 Calculus of ureter: Secondary | ICD-10-CM | POA: Diagnosis not present

## 2018-05-09 DIAGNOSIS — R8271 Bacteriuria: Secondary | ICD-10-CM | POA: Diagnosis not present

## 2018-05-09 DIAGNOSIS — R34 Anuria and oliguria: Secondary | ICD-10-CM | POA: Diagnosis not present

## 2018-05-09 DIAGNOSIS — N200 Calculus of kidney: Secondary | ICD-10-CM | POA: Diagnosis not present

## 2018-05-24 DIAGNOSIS — M9903 Segmental and somatic dysfunction of lumbar region: Secondary | ICD-10-CM | POA: Diagnosis not present

## 2018-05-24 DIAGNOSIS — M5412 Radiculopathy, cervical region: Secondary | ICD-10-CM | POA: Diagnosis not present

## 2018-05-24 DIAGNOSIS — M9901 Segmental and somatic dysfunction of cervical region: Secondary | ICD-10-CM | POA: Diagnosis not present

## 2018-05-24 DIAGNOSIS — M9902 Segmental and somatic dysfunction of thoracic region: Secondary | ICD-10-CM | POA: Diagnosis not present

## 2018-05-25 DIAGNOSIS — M9902 Segmental and somatic dysfunction of thoracic region: Secondary | ICD-10-CM | POA: Diagnosis not present

## 2018-05-25 DIAGNOSIS — M9903 Segmental and somatic dysfunction of lumbar region: Secondary | ICD-10-CM | POA: Diagnosis not present

## 2018-05-25 DIAGNOSIS — M9901 Segmental and somatic dysfunction of cervical region: Secondary | ICD-10-CM | POA: Diagnosis not present

## 2018-05-25 DIAGNOSIS — M5412 Radiculopathy, cervical region: Secondary | ICD-10-CM | POA: Diagnosis not present

## 2018-05-29 DIAGNOSIS — M9901 Segmental and somatic dysfunction of cervical region: Secondary | ICD-10-CM | POA: Diagnosis not present

## 2018-05-29 DIAGNOSIS — M5412 Radiculopathy, cervical region: Secondary | ICD-10-CM | POA: Diagnosis not present

## 2018-05-29 DIAGNOSIS — M9902 Segmental and somatic dysfunction of thoracic region: Secondary | ICD-10-CM | POA: Diagnosis not present

## 2018-05-29 DIAGNOSIS — M9903 Segmental and somatic dysfunction of lumbar region: Secondary | ICD-10-CM | POA: Diagnosis not present

## 2018-06-15 DIAGNOSIS — M9903 Segmental and somatic dysfunction of lumbar region: Secondary | ICD-10-CM | POA: Diagnosis not present

## 2018-06-15 DIAGNOSIS — M5412 Radiculopathy, cervical region: Secondary | ICD-10-CM | POA: Diagnosis not present

## 2018-06-15 DIAGNOSIS — M9902 Segmental and somatic dysfunction of thoracic region: Secondary | ICD-10-CM | POA: Diagnosis not present

## 2018-06-15 DIAGNOSIS — M9901 Segmental and somatic dysfunction of cervical region: Secondary | ICD-10-CM | POA: Diagnosis not present

## 2018-06-29 DIAGNOSIS — M9901 Segmental and somatic dysfunction of cervical region: Secondary | ICD-10-CM | POA: Diagnosis not present

## 2018-06-29 DIAGNOSIS — M9902 Segmental and somatic dysfunction of thoracic region: Secondary | ICD-10-CM | POA: Diagnosis not present

## 2018-06-29 DIAGNOSIS — M5412 Radiculopathy, cervical region: Secondary | ICD-10-CM | POA: Diagnosis not present

## 2018-06-29 DIAGNOSIS — M9903 Segmental and somatic dysfunction of lumbar region: Secondary | ICD-10-CM | POA: Diagnosis not present

## 2018-07-07 ENCOUNTER — Ambulatory Visit (INDEPENDENT_AMBULATORY_CARE_PROVIDER_SITE_OTHER): Payer: Medicare Other

## 2018-07-07 DIAGNOSIS — Z23 Encounter for immunization: Secondary | ICD-10-CM | POA: Diagnosis not present

## 2018-07-31 ENCOUNTER — Ambulatory Visit: Payer: Medicare Other | Admitting: Pediatrics

## 2018-08-14 ENCOUNTER — Encounter: Payer: Self-pay | Admitting: Pediatrics

## 2018-08-14 ENCOUNTER — Ambulatory Visit (INDEPENDENT_AMBULATORY_CARE_PROVIDER_SITE_OTHER): Payer: Medicare Other | Admitting: Pediatrics

## 2018-08-14 VITALS — BP 132/83 | HR 70 | Temp 97.8°F | Ht 62.0 in | Wt 137.6 lb

## 2018-08-14 DIAGNOSIS — R251 Tremor, unspecified: Secondary | ICD-10-CM

## 2018-08-14 DIAGNOSIS — Z23 Encounter for immunization: Secondary | ICD-10-CM

## 2018-08-14 DIAGNOSIS — M810 Age-related osteoporosis without current pathological fracture: Secondary | ICD-10-CM | POA: Diagnosis not present

## 2018-08-14 MED ORDER — ALENDRONATE SODIUM 70 MG PO TABS: 70.0000 mg | ORAL_TABLET | ORAL | 4 refills | Status: DC

## 2018-08-14 NOTE — Progress Notes (Signed)
  Subjective:   Patient ID: [name] Page, female    DOB: 03/27/1951, 67 y.o.   MRN: [id number] CC: Medical Management of Chronic Issues  HPI: [name] Page is a 67 y.o. female   Osteoporosis: Alendronate was stopped for a year, then restarted.  Last seen in clinic February 2017.  Has not had alendronate in over a year.  08/2015 T score -3.7, 10/2017 -3.5  Feels overall well, staying active.  Trying to walk regularly.  Had no side effects from the alendronate.  She was noticing some weakness in her left hand.  She is been doing more with hand weights, trying to strengthen her left arm which she had surgery on years ago.  She thinks the tremulousness in the hand is gotten better as her strength is gotten better.  She has no resting tremors.  She is not dropping things in her hands.  She has not noticed any weakness in her hands.  Relevant past medical, surgical, family and social history reviewed. Allergies and medications reviewed and updated. Social History   Tobacco Use  Smoking Status Never Smoker  Smokeless Tobacco Never Used   ROS: Per HPI   Objective:    BP 132/83   Pulse 70   Temp 97.8 F (36.6 C) (Oral)   Ht '5\' 2"'$  (1.575 m)   Wt 137 lb 9.6 oz (62.4 kg)   BMI 25.17 kg/m   Wt Readings from Last 3 Encounters:  08/14/18 137 lb 9.6 oz (62.4 kg)  01/30/16 135 lb (61.2 kg)  10/30/15 134 lb 12.8 oz (61.1 kg)   Gen: NAD, alert, cooperative with exam, NCAT EYES: EOMI, no conjunctival injection, or no icterus ENT: OP without erythema LYMPH: no cervical LAD CV: NRRR, normal S1/S2, no murmur, distal pulses 2+ b/l Resp: CTABL, no wheezes, normal WOB Ext: No edema, warm Neuro: Alert and oriented MSK: normal muscle bulk  Assessment & Plan:  [name] Page was seen today for medical management of chronic issues.  Diagnoses and all orders for this visit:  Osteoporosis Recheck blood work.  Restart Fosamax.  Continue regular weightbearing exercise -     VITAMIN D 25 Hydroxy (Vit-D  Deficiency, Fractures) -     BMP8+EGFR -     alendronate (FOSAMAX) 70 MG tablet; Take 1 tablet (70 mg total) by mouth every 7 (seven) days. Reported on 10/30/2015  Tremor Improved when strength got better.  Let me know if any worsening.  Other orders -     Pneumococcal polysaccharide vaccine 23-valent greater than or equal to 2yo subcutaneous/IM   Follow up plan: Return in about 1 year (around 08/15/2019). Assunta Found, MD [place]

## 2018-08-15 LAB — BMP8+EGFR
BUN/Creatinine Ratio: 21 (ref 12–28)
BUN: 19 mg/dL (ref 8–27)
CO2: 25 mmol/L (ref 20–29)
Calcium: 9.4 mg/dL (ref 8.7–10.3)
Chloride: 106 mmol/L (ref 96–106)
Creatinine, Ser: 0.92 mg/dL (ref 0.57–1.00)
GFR calc Af Amer: 74 mL/min/{1.73_m2} (ref >59)
GFR calc non Af Amer: 64 mL/min/{1.73_m2} (ref >59)
GLUCOSE: 85 mg/dL (ref 65–99)
POTASSIUM: 4.6 mmol/L (ref 3.5–5.2)
SODIUM: 146 mmol/L — AB (ref 134–144)

## 2018-08-15 LAB — VITAMIN D 25 HYDROXY (VIT D DEFICIENCY, FRACTURES): Vit D, 25-Hydroxy: 32.3 ng/mL (ref 30.0–100.0)

## 2018-08-23 ENCOUNTER — Ambulatory Visit (INDEPENDENT_AMBULATORY_CARE_PROVIDER_SITE_OTHER): Payer: Medicare Other

## 2018-08-23 VITALS — BP 131/73 | HR 71 | Temp 98.4°F | Ht 62.0 in | Wt 137.0 lb

## 2018-08-23 DIAGNOSIS — Z0000 Encounter for general adult medical examination without abnormal findings: Secondary | ICD-10-CM

## 2018-08-23 NOTE — Patient Instructions (Signed)
  Ms. Monterrosa , Thank you for taking time to come for your Medicare Wellness Visit. I appreciate your ongoing commitment to your health goals. Please review the following plan we discussed and let me know if I can assist you in the future.   These are the goals we discussed: Goals    . Have 3 meals a day    . LIFESTYLE - DECREASE FALLS RISK       This is a list of the screening recommended for you and due dates:  Health Maintenance  Topic Date Due  .  Hepatitis C: One time screening is recommended by Center for Disease Control  (CDC) for  adults born from [birth year] through 1966.   03/13/1951  . Mammogram  10/20/2018  . Colon Cancer Screening  09/21/2019  . Tetanus Vaccine  10/22/2019  . DEXA scan (bone density measurement)  10/25/2019  . Flu Shot  Completed  . Pneumonia vaccines  Completed

## 2018-08-23 NOTE — Progress Notes (Addendum)
Subjective:   [name] Page is a 67 y.o. female who presents for an Initial Medicare Annual Wellness Visit.  Review of Systems    Patient is here today for her Medicare annual wellness visit.  She is a pleasant, active, 67 year old who has been married for 5 years.  She has one daughter from her previous marriage and two grandchildren.  In her spare time, she enjoys attending sporting events that her grandchildren are participating in.  She exercises at the [employer] three days per week.  She and her husband are very active in their church, [employer].    Cardiac Risk Factors include: advanced age (>51men, >60 women);family history of premature cardiovascular disease     Objective:    Today's Vitals   08/23/18 0913  BP: 131/73  Pulse: 71  Temp: 98.4 F (36.9 C)  TempSrc: Oral  Weight: 137 lb (62.1 kg)  Height: 5\' 2"  (1.575 m)   Body mass index is 25.06 kg/m.  Advanced Directives 08/23/2018 01/30/2016 07/08/2014 07/04/2014 08/29/2013 08/10/2013  Does Patient Have a Medical Advance Directive? No No No No Patient does not have advance directive;Patient would like information Patient does not have advance directive  Would patient like information on creating a medical advance directive? No - Patient declined No - patient declined information No - patient declined information Yes Higher education careers adviser given Advance directive packet given -  Pre-existing out of facility DNR order (yellow form or pink MOST form) - - - - No No    Current Medications (verified) Outpatient Encounter Medications as of 08/23/2018  Medication Sig  . alendronate (FOSAMAX) 70 MG tablet Take 1 tablet (70 mg total) by mouth every 7 (seven) days. Reported on 10/30/2015  . Calcium Carbonate-Vit D-Min (CALCIUM 1200 PO) Take 1 tablet by mouth daily.  . Cholecalciferol (VITAMIN D) 50 MCG (2000 UT) CAPS Take 2,000 Units by mouth daily.  . Glycerin-Hypromellose-PEG 400 ([employer] DRY EYE RELIEF)  0.2-0.2-1 % SOLN Apply 1 drop to eye daily as needed (for pain).  Marland Kitchen ibuprofen (ADVIL,MOTRIN) 200 MG tablet Take 200-400 mg by mouth every 6 (six) hours as needed for moderate pain.  . Multiple Vitamin (MULTIVITAMIN WITH MINERALS) TABS tablet Take 1 tablet by mouth daily.   No facility-administered encounter medications on file as of 08/23/2018.     Allergies (verified) Sulfa antibiotics   History: Past Medical History:  Diagnosis Date  . Arthritis   . Basal cell carcinoma   . Cancer ([place])    melanoma  . Dysuria   . Frequency of urination   . Hematuria   . History of kidney stones   . Osteoporosis   . Renal calculus, bilateral   . Urgency of urination   . Wears glasses    Past Surgical History:  Procedure Laterality Date  . CYSTOSCOPY WITH RETROGRADE PYELOGRAM, URETEROSCOPY AND STENT PLACEMENT Bilateral 08/11/2013   Procedure: CYSTOSCOPY WITH RETROGRADE PYELOGRAM, URETEROSCOPY AND STENT PLACEMENT;  Surgeon: Alexis Frock, MD;  Location: [facility];  Service: Urology;  Laterality: Bilateral;  . CYSTOSCOPY WITH URETEROSCOPY AND STENT PLACEMENT Bilateral 08/29/2013   Procedure: CYSTOSCOPY WITH BILATERAL URETEROSCOPY AND STENT REPLACEMENTS, stone extraction;  Surgeon: Alexis Frock, MD;  Location: [facility];  Service: Urology;  Laterality: Bilateral;  . CYSTOSCOPY/RETROGRADE/URETEROSCOPY/STONE EXTRACTION WITH BASKET Bilateral 01/31/2016   Procedure: CYSTOSCOPY/BILATERAL RETROGRADE PYELOGRAMS/BILATERAL URETEROSCOPY/BILATERAL STONE EXTRACTION WITH BASKET/RIGHT URETERAL STENT PLACEMENT;  Surgeon: Raynelle Bring, MD;  Location: [facility];  Service: Urology;  Laterality: Bilateral;  .  HEMIARTHROPLASTY SHOULDER FRACTURE Left 1992  . HOLMIUM LASER APPLICATION Bilateral 62/86/3817   Procedure: HOLMIUM LASER APPLICATION;  Surgeon: Alexis Frock, MD;  Location[facility];  Service: Urology;  Laterality: Bilateral;   Family History  Problem Relation Age of Onset  .  Cancer Mother   . Atrial fibrillation Father   . Hyperlipidemia Father   . Hypertension Father   . Hypertension Sister   . Hyperlipidemia Sister   . Thyroid disease Sister    Social History   Socioeconomic History  . Marital status: Married    Spouse name: Not on file  . Number of children: Not on file  . Years of education: Not on file  . Highest education level: Not on file  Occupational History  . Not on file  Social Needs  . Financial resource strain: Not hard at all  . Food insecurity:    Worry: Never true    Inability: Never true  . Transportation needs:    Medical: No    Non-medical: No  Tobacco Use  . Smoking status: Never Smoker  . Smokeless tobacco: Never Used  Substance and Sexual Activity  . Alcohol use: No  . Drug use: No  . Sexual activity: Not on file  Lifestyle  . Physical activity:    Days per week: 3 days    Minutes per session: 60 min  . Stress: Not at all  Relationships  . Social connections:    Talks on phone: More than three times a week    Gets together: Twice a week    Attends religious service: More than 4 times per year    Active member of club or organization: No    Attends meetings of clubs or organizations: Never    Relationship status: Married  Other Topics Concern  . Not on file  Social History Narrative  . Not on file    Clinical Intake:   Activities of Daily Living In your present state of health, do you have any difficulty performing the following activities: 08/23/2018  Hearing? N  Vision? N  Difficulty concentrating or making decisions? N  Walking or climbing stairs? N  Dressing or bathing? N  Doing errands, shopping? N  Preparing Food and eating ? N  Using the Toilet? N  In the past six months, have you accidently leaked urine? N  Do you have problems with loss of bowel control? N  Managing your Medications? N  Managing your Finances? N  Housekeeping or managing your Housekeeping? N  Some recent data might be  hidden     Immunizations and Health Maintenance Immunization History  Administered Date(s) Administered  . Influenza, High Dose Seasonal PF 08/08/2015, 07/07/2018  . Influenza,inj,Quad PF,6+ Mos 08/11/2013  . Influenza-Unspecified 06/05/2014  . Pneumococcal Conjugate-13 10/30/2015  . Pneumococcal Polysaccharide-23 08/14/2018   Health Maintenance Due  Topic Date Due  . Hepatitis C Screening  February 20, 1951    Patient Care Team: Eustaquio Maize, MD as PCP - General (Pediatrics) Raynelle Bring, MD as Consulting Physician (Urology)  Indicate any recent Medical Services you may have received from other than [facility] providers in the past year (date may be approximate).     Assessment:   This is a routine wellness examination for [place].  Dietary issues and exercise activities discussed: Current Exercise Habits: Structured exercise class, Type of exercise: strength training/weights, Time (Minutes): 60, Frequency (Times/Week): 3, Weekly Exercise (Minutes/Week): 180, Intensity: Moderate, Exercise limited by: None identified  Goals    .  Have 3 meals a day    . LIFESTYLE - DECREASE FALLS RISK      Depression Screen PHQ 2/9 Scores 08/23/2018 08/14/2018 10/30/2015  PHQ - 2 Score 0 0 0    Fall Risk Fall Risk  08/23/2018 08/14/2018 10/30/2015  Falls in the past year? 0 0 No    Is the patient's home free of loose throw rugs in walkways, pet beds, electrical cords, etc?  Yes       Grab bars in the bathroom? No      Handrails on the stairs?   Yes      Adequate lighting?   Yes   Cognitive Function: MMSE - Mini Mental State Exam 08/23/2018  Orientation to time 5  Orientation to Place 5  Registration 3  Attention/ Calculation 5  Recall 2  Language- name 2 objects 2  Language- repeat 1  Language- follow 3 step command 3  Language- read & follow direction 1  Write a sentence 1  Copy design 1  Total score 29    Patient performed well on the MMSE, scoring 29 out of 30 available points.      Screening Tests Health Maintenance  Topic Date Due  . Hepatitis C Screening  01/01/1951  . MAMMOGRAM  10/20/2018  . COLONOSCOPY  09/21/2019  . TETANUS/TDAP  10/22/2019  . DEXA SCAN  10/25/2019  . INFLUENZA VACCINE  Completed  . PNA vac Low Risk Adult  Completed    Cancer Screenings: Lung: Low Dose CT Chest recommended if Age 67-80 years, 30 pack-year currently smoking OR have quit w/in 15years. Patient does not qualify. Breast: Up to date on Mammogram? Yes Up to date of Bone Density/Dexa? Yes Colorectal: Yes  Additional Screenings:  Hepatitis C Screening:      Plan:    I have personally reviewed and noted the following in the patient's chart:   . Medical and social history . Use of alcohol, tobacco or illicit drugs  . Current medications and supplements . Functional ability and status . Nutritional status . Physical activity . Advanced directives . List of other physicians . Hospitalizations, surgeries, and ER visits in previous 12 months . Vitals . Screenings to include cognitive, depression, and falls . Referrals and appointments  In addition, I have reviewed and discussed with patient certain preventive protocols, quality metrics, and best practice recommendations. A written personalized care plan for preventive services as well as general preventive health recommendations were provided to patient.     Burnadette Pop, LPN   80/05/9832    I have reviewed and agree with the above AWV documentation.   Assunta Found, MD [facility] 08/24/2018, 1:17 PM

## 2018-09-20 DIAGNOSIS — D049 Carcinoma in situ of skin, unspecified: Secondary | ICD-10-CM

## 2018-09-20 DIAGNOSIS — C44529 Squamous cell carcinoma of skin of other part of trunk: Secondary | ICD-10-CM

## 2018-09-20 HISTORY — DX: Squamous cell carcinoma of skin of other part of trunk: C44.529

## 2018-09-20 HISTORY — DX: Carcinoma in situ of skin, unspecified: D04.9

## 2018-10-30 DIAGNOSIS — Z1231 Encounter for screening mammogram for malignant neoplasm of breast: Secondary | ICD-10-CM | POA: Diagnosis not present

## 2018-11-08 DIAGNOSIS — R922 Inconclusive mammogram: Secondary | ICD-10-CM | POA: Diagnosis not present

## 2018-11-08 LAB — HM MAMMOGRAPHY

## 2018-11-21 ENCOUNTER — Telehealth: Payer: Self-pay | Admitting: Pediatrics

## 2019-01-27 ENCOUNTER — Encounter (HOSPITAL_COMMUNITY): Payer: Self-pay | Admitting: Emergency Medicine

## 2019-01-27 ENCOUNTER — Other Ambulatory Visit: Payer: Self-pay

## 2019-01-27 ENCOUNTER — Emergency Department (HOSPITAL_COMMUNITY)
Admission: EM | Admit: 2019-01-27 | Discharge: 2019-01-28 | Disposition: A | Discharge status: Home / Self Care | Payer: Medicare Other | Attending: Emergency Medicine | Admitting: Emergency Medicine

## 2019-01-27 ENCOUNTER — Emergency Department (HOSPITAL_COMMUNITY): Payer: Medicare Other

## 2019-01-27 DIAGNOSIS — N132 Hydronephrosis with renal and ureteral calculous obstruction: Secondary | ICD-10-CM | POA: Diagnosis not present

## 2019-01-27 DIAGNOSIS — Z85828 Personal history of other malignant neoplasm of skin: Secondary | ICD-10-CM | POA: Insufficient documentation

## 2019-01-27 DIAGNOSIS — Z79899 Other long term (current) drug therapy: Secondary | ICD-10-CM | POA: Diagnosis not present

## 2019-01-27 DIAGNOSIS — N200 Calculus of kidney: Secondary | ICD-10-CM | POA: Insufficient documentation

## 2019-01-27 DIAGNOSIS — K8020 Calculus of gallbladder without cholecystitis without obstruction: Secondary | ICD-10-CM | POA: Diagnosis not present

## 2019-01-27 DIAGNOSIS — K5730 Diverticulosis of large intestine without perforation or abscess without bleeding: Secondary | ICD-10-CM | POA: Diagnosis not present

## 2019-01-27 DIAGNOSIS — R109 Unspecified abdominal pain: Secondary | ICD-10-CM | POA: Diagnosis present

## 2019-01-27 LAB — CBC WITH DIFFERENTIAL/PLATELET
Abs Immature Granulocytes: 0.04 10*3/uL (ref 0.00–0.07)
Basophils Absolute: 0 10*3/uL (ref 0.0–0.1)
Basophils Relative: 0 %
Eosinophils Absolute: 0 10*3/uL (ref 0.0–0.5)
Eosinophils Relative: 0 %
HCT: 42 % (ref 36.0–46.0)
Hemoglobin: 12.5 g/dL (ref 12.0–15.0)
Immature Granulocytes: 0 %
Lymphocytes Relative: 7 %
Lymphs Abs: 0.7 10*3/uL (ref 0.7–4.0)
MCH: 26 pg (ref 26.0–34.0)
MCHC: 29.8 g/dL — ABNORMAL LOW (ref 30.0–36.0)
MCV: 87.5 fL (ref 80.0–100.0)
Monocytes Absolute: 0.3 10*3/uL (ref 0.1–1.0)
Monocytes Relative: 3 %
Neutro Abs: 9.2 10*3/uL — ABNORMAL HIGH (ref 1.7–7.7)
Neutrophils Relative %: 90 %
Platelets: 212 10*3/uL (ref 150–400)
RBC: 4.8 MIL/uL (ref 3.87–5.11)
RDW: 14.5 % (ref 11.5–15.5)
WBC: 10.3 10*3/uL (ref 4.0–10.5)
nRBC: 0 % (ref 0.0–0.2)

## 2019-01-27 LAB — URINALYSIS, ROUTINE W REFLEX MICROSCOPIC
Bilirubin Urine: NEGATIVE
Glucose, UA: NEGATIVE mg/dL
Ketones, ur: 20 mg/dL — AB
Nitrite: NEGATIVE
Protein, ur: 30 mg/dL — AB
RBC / HPF: >50 RBC/hpf — ABNORMAL HIGH (ref 0–5)
Specific Gravity, Urine: 1.019 (ref 1.005–1.030)
pH: 6 (ref 5.0–8.0)

## 2019-01-27 IMAGING — CT CT RENAL STONE PROTOCOL
2 of 4 series · 16 of 46 positions shown, 18 images · non-contrast
Comparison: CT of the abdomen pelvis dated [DATE]

CLINICAL DATA: 67-year-old female with left-sided flank pain.

EXAM:
CT ABDOMEN AND PELVIS WITHOUT CONTRAST
TECHNIQUE: Multidetector CT imaging of the abdomen and pelvis was performed
following the standard protocol without IV contrast.

[Series 2: axial st · axial · 0.91mm/px · z∈[-398,-48]mm · 13 of 78 slices shown, 15 images]
[im 4/78  soft-tissue]
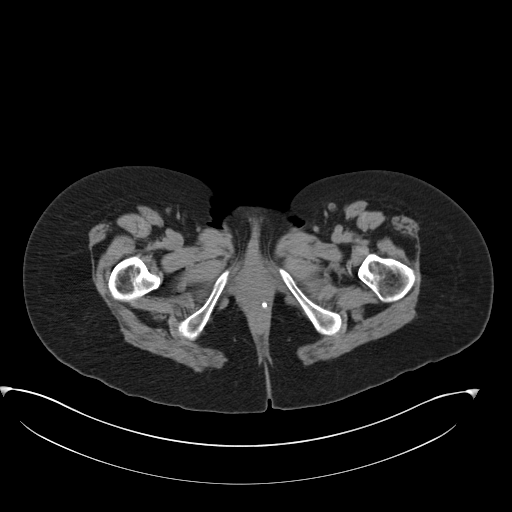
[im 4/78  bone]
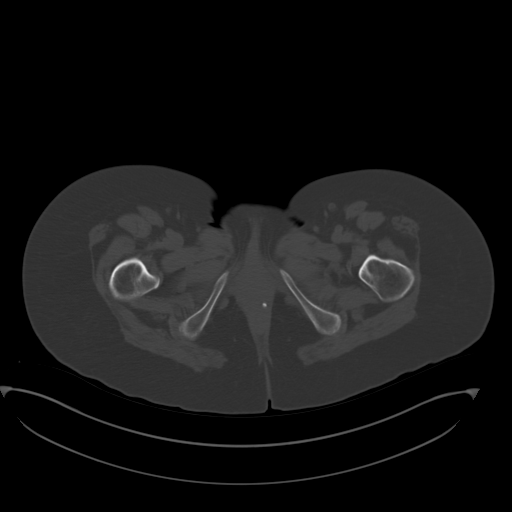
[im 12/78  soft-tissue]
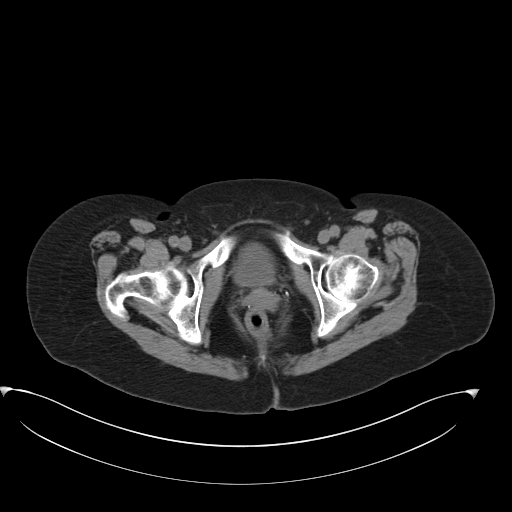
[im 16/78  soft-tissue]
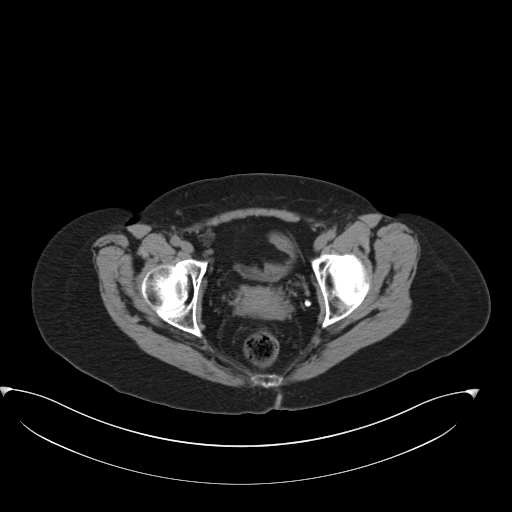
[im 24/78  soft-tissue]
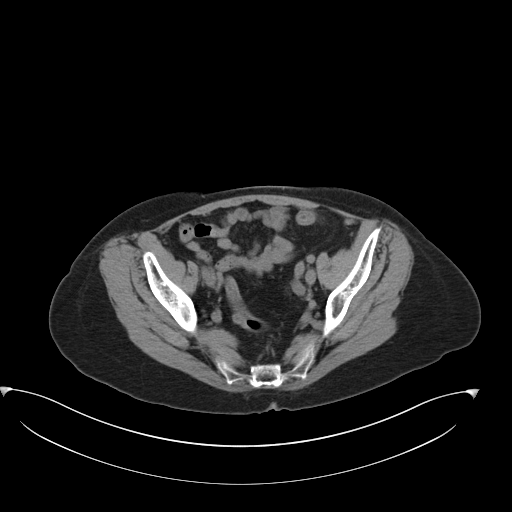
[im 27/78  soft-tissue]
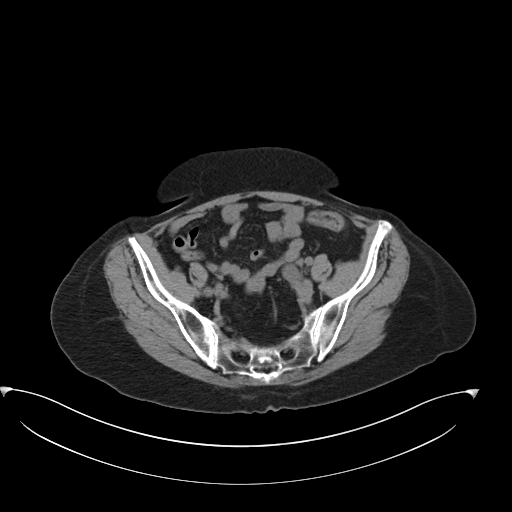
[im 35/78  soft-tissue]
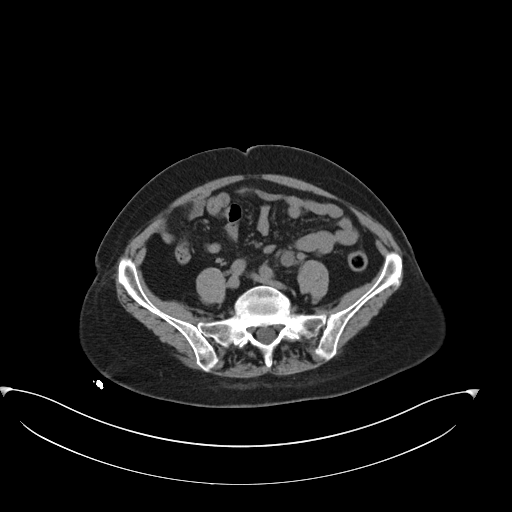
[im 39/78  soft-tissue]
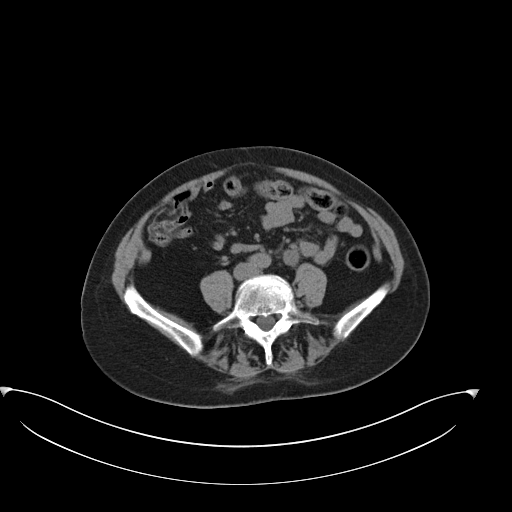
[im 43/78  soft-tissue]
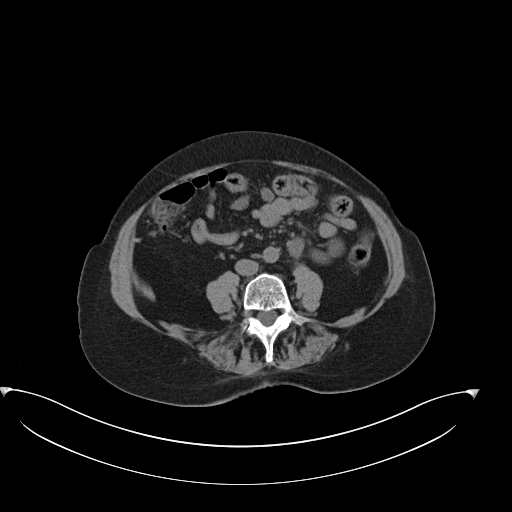
[im 51/78  soft-tissue]
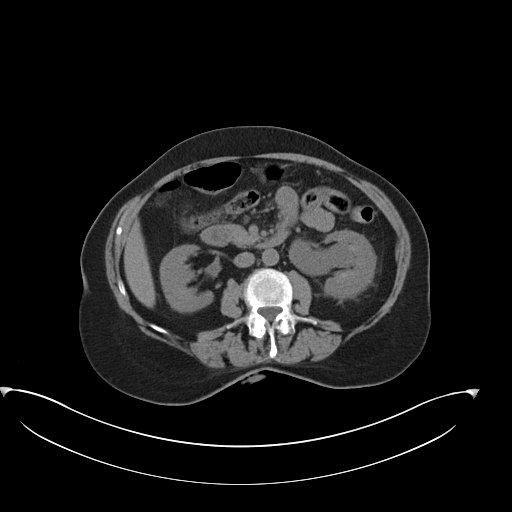
[im 51/78  bone]
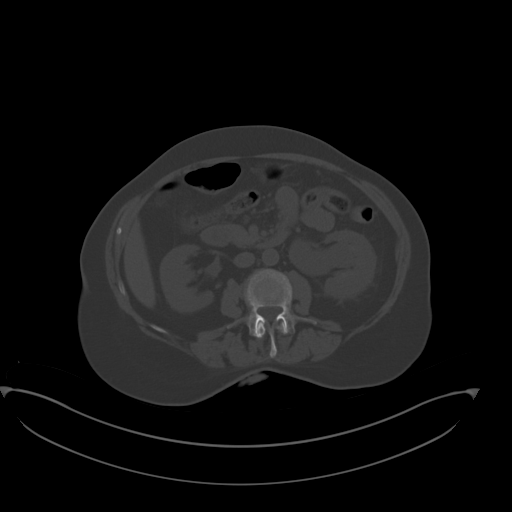
[im 54/78  soft-tissue]
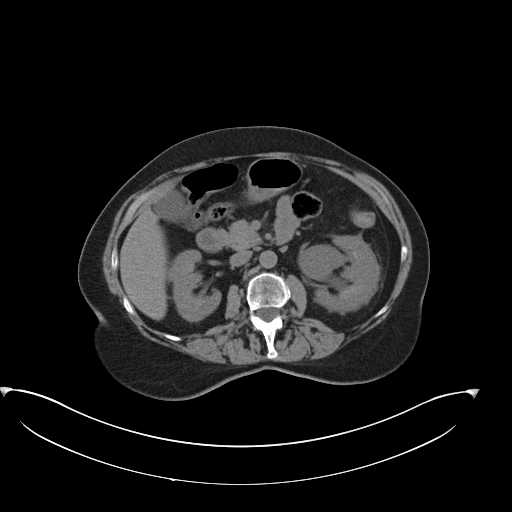
[im 62/78  soft-tissue]
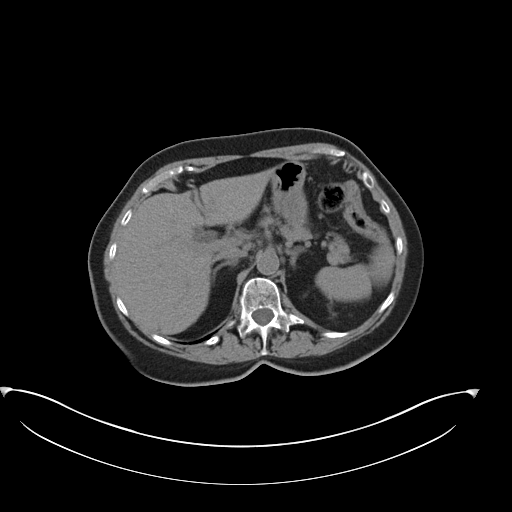
[im 66/78  soft-tissue]
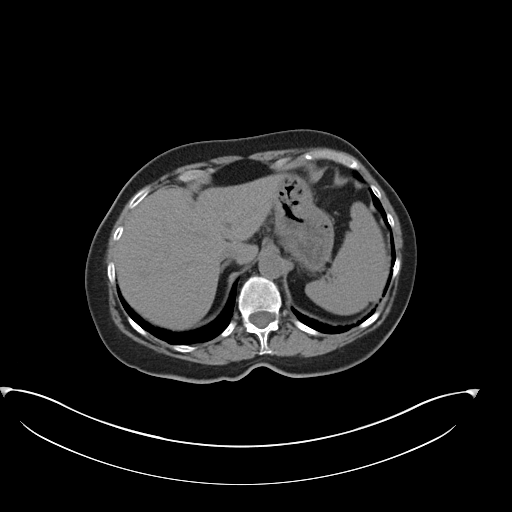
[im 74/78  soft-tissue]
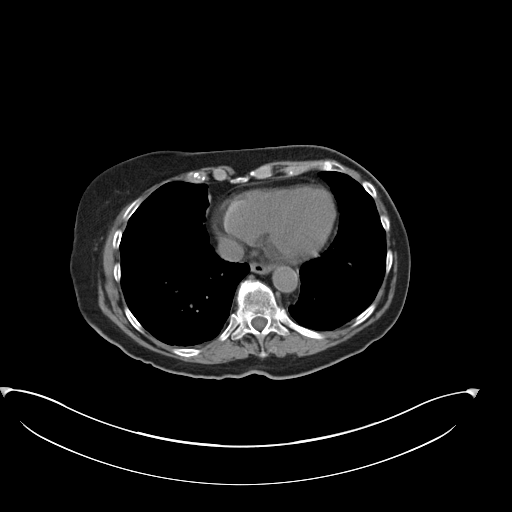

[Series 4: coronal · coronal · 0.73mm/px · 3 of 155 slices shown]
[im 52/155  soft-tissue]
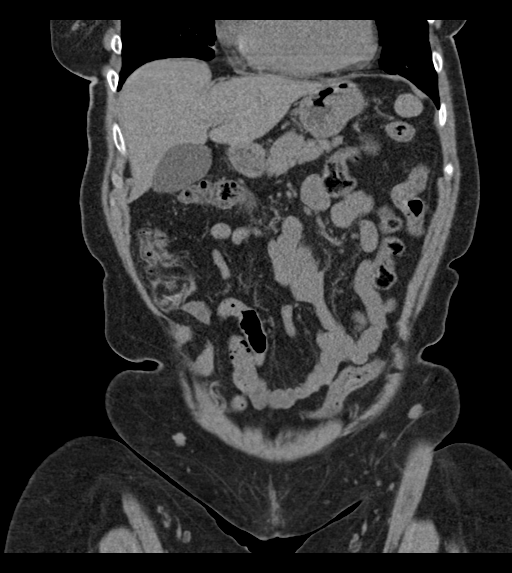
[im 69/155  soft-tissue]
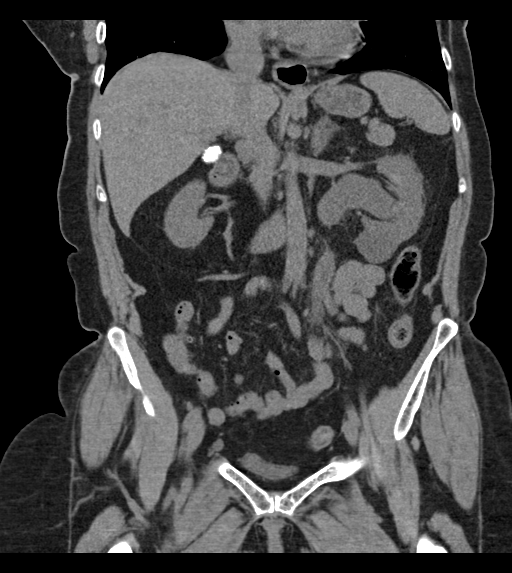
[im 86/155  soft-tissue]
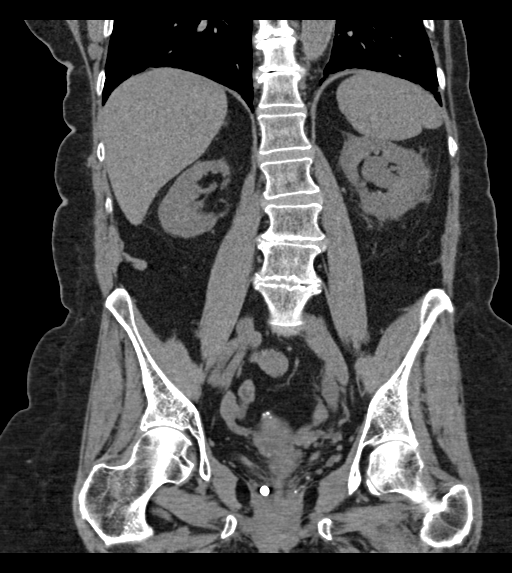

[16 of 46 positions shown; findings below may reference images not displayed]

FINDINGS: Evaluation of this exam is limited in the absence of intravenous
contrast.

Lower chest: The visualized lung bases are clear.

No intra-abdominal free air or free fluid.

Hepatobiliary: Stable 15 mm hypodense lesion in the left lobe of the
liver which is not characterized on this noncontrast CT but
demonstrates fluid attenuation most likely a cyst. No intrahepatic
biliary ductal dilatation. There is a 15 mm stone or cluster of
adjacent stones in the gallbladder. No pericholecystic fluid.

Pancreas: Unremarkable. No pancreatic ductal dilatation or
surrounding inflammatory changes.

Spleen: Normal in size without focal abnormality.

Adrenals/Urinary Tract: The adrenal glands are unremarkable. There
is a 5 mm stone in the distal left ureter with moderate left
hydronephrosis. Additional punctate nonobstructing renal calculi
noted in the left kidney. There is no hydronephrosis or
nephrolithiasis on the right. The right ureter appears unremarkable.
The urinary bladder is predominantly collapsed.

Stomach/Bowel: There is a small hiatal hernia. There is no bowel
obstruction or active inflammation. Small scattered sigmoid
diverticula without active inflammatory changes. The appendix is
normal.

Vascular/Lymphatic: The abdominal aorta and IVC are grossly
unremarkable. No portal venous gas. There is no adenopathy.

Reproductive: Small calcified anterior uterine fibroid. The uterus
is retroflexed. The ovaries are grossly unremarkable.

Other: Small fat containing umbilical hernia.

Musculoskeletal: Osteopenia with degenerative changes of the spine.
No acute osseous pathology.
IMPRESSION: 1. A 5 mm distal left ureteral stone with moderate left
hydronephrosis.
2. Cholelithiasis.
3. Small scattered sigmoid diverticula. No bowel obstruction or
active inflammation. Normal appendix.

## 2019-01-27 MED ORDER — ONDANSETRON HCL 4 MG/2ML IJ SOLN
4.0000 mg | Freq: Once | INTRAMUSCULAR | Status: AC
  Administered 2019-01-27: 4 mg via INTRAVENOUS
  Filled 2019-01-27: qty 2

## 2019-01-27 MED ORDER — KETOROLAC TROMETHAMINE 30 MG/ML IJ SOLN
15.0000 mg | Freq: Once | INTRAMUSCULAR | Status: AC
  Administered 2019-01-27: 15 mg via INTRAVENOUS
  Filled 2019-01-27: qty 1

## 2019-01-27 MED ORDER — SODIUM CHLORIDE 0.9 % IV BOLUS (SEPSIS)
500.0000 mL | Freq: Once | INTRAVENOUS | Status: AC
  Administered 2019-01-27: 500 mL via INTRAVENOUS

## 2019-01-27 MED ORDER — MORPHINE SULFATE (PF) 4 MG/ML IV SOLN
4.0000 mg | Freq: Once | INTRAVENOUS | Status: AC
  Administered 2019-01-27: 4 mg via INTRAVENOUS
  Filled 2019-01-27: qty 1

## 2019-01-27 NOTE — ED Triage Notes (Signed)
Pt reports having kidney stone pain on left side that started around 6pm and has gotten worse. Pt has hx of kidney stones. Pt reports last episode 4-5 months ago.

## 2019-01-27 NOTE — ED Provider Notes (Signed)
TIME SEEN: 11:15 PM  CHIEF COMPLAINT: Left flank pain  HPI: Patient is a 68 year old female with history of kidney stones who presents to the emergency department with left flank pain.  States symptoms started earlier today.  Has had multiple episodes of nausea and vomiting.  Has also had some diarrhea.  No fevers, abdominal pain.  No dysuria, hematuria.  Feels similar to her previous kidney stones.  She has had to have surgery for her kidney stones before.  Last stone was several years ago.  States her husband drove her to the emergency room.  ROS: See HPI Constitutional: no fever  Eyes: no drainage  ENT: no runny nose   Cardiovascular:  no chest pain  Resp: no SOB  GI:  vomiting GU: no dysuria Integumentary: no rash  Allergy: no hives  Musculoskeletal: no leg swelling  Neurological: no slurred speech ROS otherwise negative  PAST MEDICAL HISTORY/PAST SURGICAL HISTORY:  Past Medical History:  Diagnosis Date  . Arthritis   . Basal cell carcinoma   . Cancer ([place])    melanoma  . Dysuria   . Frequency of urination   . Hematuria   . History of kidney stones   . Osteoporosis   . Renal calculus, bilateral   . Urgency of urination   . Wears glasses     MEDICATIONS:  Prior to Admission medications   Medication Sig Start Date End Date Taking? Authorizing Provider  alendronate (FOSAMAX) 70 MG tablet Take 1 tablet (70 mg total) by mouth every 7 (seven) days. Reported on 10/30/2015 08/14/18   Eustaquio Maize, MD  Calcium Carbonate-Vit D-Min (CALCIUM 1200 PO) Take 1 tablet by mouth daily.    [provider]  Cholecalciferol (VITAMIN D) 50 MCG (2000 UT) CAPS Take 2,000 Units by mouth daily.    [provider]  Glycerin-Hypromellose-PEG 400 ([facility] DRY EYE RELIEF) 0.2-0.2-1 % SOLN Apply 1 drop to eye daily as needed (for pain).    [provider]  ibuprofen (ADVIL,MOTRIN) 200 MG tablet Take 200-400 mg by mouth every 6 (six) hours as needed for moderate pain.     [provider]  Multiple Vitamin (MULTIVITAMIN WITH MINERALS) TABS tablet Take 1 tablet by mouth daily.    [provider]    ALLERGIES:  Allergies  Allergen Reactions  . Sulfa Antibiotics Rash    SOCIAL HISTORY:  Social History   Tobacco Use  . Smoking status: Never Smoker  . Smokeless tobacco: Never Used  Substance Use Topics  . Alcohol use: No    FAMILY HISTORY: Family History  Problem Relation Age of Onset  . Cancer Mother   . Atrial fibrillation Father   . Hyperlipidemia Father   . Hypertension Father   . Hypertension Sister   . Hyperlipidemia Sister   . Thyroid disease Sister     EXAM: BP (!) 181/81 (BP Location: Left Arm)   Pulse 73   Temp 98.4 F (36.9 C) (Oral)   Resp 18   Ht 5\' 2"  (1.575 m)   Wt 62.6 kg   SpO2 96%   BMI 25.24 kg/m  CONSTITUTIONAL: Alert and oriented and responds appropriately to questions.  Appears slightly uncomfortable and anxious HEAD: Normocephalic EYES: Conjunctivae clear, pupils appear equal, EOMI ENT: normal nose; moist mucous membranes NECK: Supple, no meningismus, no nuchal rigidity, no LAD  CARD: RRR; S1 and S2 appreciated; no murmurs, no clicks, no rubs, no gallops RESP: Normal chest excursion without splinting or tachypnea; breath sounds clear and equal  bilaterally; no wheezes, no rhonchi, no rales, no hypoxia or respiratory distress, speaking full sentences ABD/GI: Normal bowel sounds; non-distended; soft, non-tender, no rebound, no guarding, no peritoneal signs, no hepatosplenomegaly BACK:  The back appears normal and is non-tender to palpation, there is no CVA tenderness, no midline spinal tenderness or step-off or deformity EXT: Normal ROM in all joints; non-tender to palpation; no edema; normal capillary refill; no cyanosis, no calf tenderness or swelling    SKIN: Normal color for age and race; warm; no rash NEURO: Moves all extremities equally PSYCH: The patient's mood and manner are appropriate.  Grooming and personal hygiene are appropriate.  MEDICAL DECISION MAKING: Patient here with left flank pain that feels similar to her previous kidney stones.  Given she has had had previous surgery before, will obtain CT imaging.  Labs, urine pending.  Will give IV fluids, Toradol, morphine, Zofran for symptomatic relief.  Low suspicion for appendicitis, bowel obstruction, diverticulitis, dissection at this time.  ED PROGRESS: Patient's labs are unremarkable.  Urine shows blood but no sign of infection.  CT imaging shows a 5 mm distal left ureteral stone with moderate left hydronephrosis.  She has cholelithiasis without cholecystitis.  Normal-appearing appendix.  She reports pain and nausea are well controlled after 1 dose of morphine, Toradol and Zofran.  Declines any further medications in the ED.  Will discharge with urine strainer, prescriptions for Percocet, Zofran.  Will hold Flomax given she is allergic to sulfa medications.  We will have her follow-up with urology as an outpatient.  She is comfortable with this plan.  At this time, I do not feel there is any life-threatening condition present. I have reviewed and discussed all results (EKG, imaging, lab, urine as appropriate) and exam findings with patient/family. I have reviewed nursing notes and appropriate previous records.  I feel the patient is safe to be discharged home without further emergent workup and can continue workup as an outpatient as needed. Discussed usual and customary return precautions. Patient/family verbalize understanding and are comfortable with this plan.  Outpatient follow-up has been provided as needed. All questions have been answered.     Mahealani Sulak, Delice Bison, DO 01/28/19 [phone]

## 2019-01-28 ENCOUNTER — Other Ambulatory Visit: Payer: Self-pay

## 2019-01-28 LAB — COMPREHENSIVE METABOLIC PANEL
ALT: 12 U/L (ref 0–44)
AST: 20 U/L (ref 15–41)
Albumin: 4.2 g/dL (ref 3.5–5.0)
Alkaline Phosphatase: 119 U/L (ref 38–126)
Anion gap: 12 (ref 5–15)
BUN: 24 mg/dL — ABNORMAL HIGH (ref 8–23)
CO2: 25 mmol/L (ref 22–32)
Calcium: 9.5 mg/dL (ref 8.9–10.3)
Chloride: 107 mmol/L (ref 98–111)
Creatinine, Ser: 0.9 mg/dL (ref 0.44–1.00)
GFR calc Af Amer: >60 mL/min (ref >60)
GFR calc non Af Amer: >60 mL/min (ref >60)
Glucose, Bld: 166 mg/dL — ABNORMAL HIGH (ref 70–99)
Potassium: 3.9 mmol/L (ref 3.5–5.1)
Sodium: 144 mmol/L (ref 135–145)
Total Bilirubin: 0.4 mg/dL (ref 0.3–1.2)
Total Protein: 8 g/dL (ref 6.5–8.1)

## 2019-01-28 MED ORDER — OXYCODONE-ACETAMINOPHEN 5-325 MG PO TABS: 1.0000 | ORAL_TABLET | ORAL | 0 refills | Status: DC | PRN

## 2019-01-28 MED ORDER — ONDANSETRON 4 MG PO TBDP: 4.0000 mg | ORAL_TABLET | Freq: Four times a day (QID) | ORAL | 0 refills | Status: DC | PRN

## 2019-01-28 NOTE — Discharge Instructions (Addendum)
You have a 5 mm left-sided kidney stone.  Your urine showed blood but no sign of infection.  Your labs today were normal.  Please follow-up with urology as an outpatient.

## 2019-02-07 DIAGNOSIS — N202 Calculus of kidney with calculus of ureter: Secondary | ICD-10-CM | POA: Diagnosis not present

## 2019-05-08 DIAGNOSIS — R82991 Hypocitraturia: Secondary | ICD-10-CM | POA: Diagnosis not present

## 2019-05-15 DIAGNOSIS — R34 Anuria and oliguria: Secondary | ICD-10-CM | POA: Diagnosis not present

## 2019-05-15 DIAGNOSIS — N281 Cyst of kidney, acquired: Secondary | ICD-10-CM | POA: Diagnosis not present

## 2019-05-15 DIAGNOSIS — N200 Calculus of kidney: Secondary | ICD-10-CM | POA: Diagnosis not present

## 2019-06-27 DIAGNOSIS — Z23 Encounter for immunization: Secondary | ICD-10-CM | POA: Diagnosis not present

## 2019-08-17 ENCOUNTER — Ambulatory Visit: Payer: Medicare Other | Admitting: Family Medicine

## 2019-08-17 ENCOUNTER — Ambulatory Visit: Payer: Medicare Other | Admitting: Pediatrics

## 2019-08-20 ENCOUNTER — Other Ambulatory Visit: Payer: Self-pay

## 2019-08-20 ENCOUNTER — Ambulatory Visit: Payer: Medicare Other | Admitting: Family Medicine

## 2019-09-11 ENCOUNTER — Ambulatory Visit (INDEPENDENT_AMBULATORY_CARE_PROVIDER_SITE_OTHER): Payer: Medicare Other | Admitting: *Deleted

## 2019-09-11 DIAGNOSIS — Z0000 Encounter for general adult medical examination without abnormal findings: Secondary | ICD-10-CM | POA: Diagnosis not present

## 2019-09-11 NOTE — Progress Notes (Signed)
MEDICARE ANNUAL WELLNESS VISIT  09/11/2019  Telephone Visit Disclaimer This Medicare AWV was conducted by telephone due to national recommendations for restrictions regarding the COVID-19 Pandemic (e.g. social distancing).  I verified, using two identifiers, that I am speaking with [name] Page or their authorized healthcare agent. I discussed the limitations, risks, security, and privacy concerns of performing an evaluation and management service by telephone and the potential availability of an in-person appointment in the future. The patient expressed understanding and agreed to proceed.   Subjective:  [name] Page is a 68 y.o. female patient of [name] Norlander, DO who had a Medicare Annual Wellness Visit today via telephone. [name] is Retired and lives alone. she has 1 children. she reports that she is socially active and does interact with friends/family regularly. she is minimally physically active and enjoys her grandchildren.  Patient Care Team: [name] Norlander, DO as PCP - General (Family Medicine) [name] Frock, MD as Consulting Physician (Urology)  Advanced Directives 09/11/2019 01/27/2019 08/23/2018 01/30/2016 07/08/2014 07/04/2014 08/29/2013  Does Patient Have a Medical Advance Directive? No No No No No No Patient does not have advance directive;Patient would like information  Would patient like information on creating a medical advance directive? Yes (MAU/Ambulatory/Procedural Areas - Information given) - No - Patient declined No - patient declined information No - patient declined information Yes Higher education careers adviser given Advance directive packet given  Pre-existing out of facility DNR order (yellow form or pink MOST form) - - - - - - No    Hospital Utilization Over the Past 12 Months: # of hospitalizations or ER visits: 1 # of surgeries: 0  Review of Systems    Patient reports that her overall health is better compared to last year.  History obtained  from chart review and the patient  Patient Reported Readings (BP, Pulse, CBG, Weight, etc) none  Pain Assessment Pain : No/denies pain     Current Medications & Allergies (verified) Allergies as of 09/11/2019      Reactions   Sulfa Antibiotics Rash      Medication List       Accurate as of September 11, 2019 11:09 AM. If you have any questions, ask your nurse or doctor.        STOP taking these medications   Vitamin D 50 MCG (2000 UT) Caps     TAKE these medications   alendronate 70 MG tablet Commonly known as: FOSAMAX Take 1 tablet (70 mg total) by mouth every 7 (seven) days. Reported on 10/30/2015 What changed:   when to take this  additional instructions   CALCIUM 1200 PO Take 1 tablet by mouth daily.   CVS Dry Eye Relief 0.2-0.2-1 % Soln Generic drug: Glycerin-Hypromellose-PEG 400 Apply 1 drop to eye 2 (two) times daily as needed (dry eyes).   ibuprofen 200 MG tablet Commonly known as: ADVIL Take 200-400 mg by mouth every 6 (six) hours as needed for moderate pain.   loratadine 10 MG tablet Commonly known as: CLARITIN Take 10 mg by mouth daily as needed for allergies.   multivitamin with minerals Tabs tablet Take 1 tablet by mouth daily.   ondansetron 4 MG disintegrating tablet Commonly known as: Zofran ODT Take 1 tablet (4 mg total) by mouth every 6 (six) hours as needed for nausea or vomiting.   oxyCODONE-acetaminophen 5-325 MG tablet Commonly known as: PERCOCET/ROXICET Take 1 tablet by mouth every 4 (four) hours as needed.  History (reviewed): Past Medical History:  Diagnosis Date  . Arthritis   . Basal cell carcinoma   . Cancer ([place])    melanoma  . Dysuria   . Frequency of urination   . Hematuria   . History of kidney stones   . Osteoporosis   . Renal calculus, bilateral   . Urgency of urination   . Wears glasses    Past Surgical History:  Procedure Laterality Date  . CYSTOSCOPY WITH RETROGRADE PYELOGRAM, URETEROSCOPY AND  STENT PLACEMENT Bilateral 08/11/2013   Procedure: CYSTOSCOPY WITH RETROGRADE PYELOGRAM, URETEROSCOPY AND STENT PLACEMENT;  Surgeon: [name] Frock, MD;  Location: [facility];  Service: Urology;  Laterality: Bilateral;  . CYSTOSCOPY WITH URETEROSCOPY AND STENT PLACEMENT Bilateral 08/29/2013   Procedure: CYSTOSCOPY WITH BILATERAL URETEROSCOPY AND STENT REPLACEMENTS, stone extraction;  Surgeon: [name] Frock, MD;  Location[facility];  Service: Urology;  Laterality: Bilateral;  . CYSTOSCOPY/RETROGRADE/URETEROSCOPY/STONE EXTRACTION WITH BASKET Bilateral 01/31/2016   Procedure: CYSTOSCOPY/BILATERAL RETROGRADE PYELOGRAMS/BILATERAL URETEROSCOPY/BILATERAL STONE EXTRACTION WITH BASKET/RIGHT URETERAL STENT PLACEMENT;  Surgeon: Raynelle Bring, MD;  Location: [facility];  Service: Urology;  Laterality: Bilateral;  . HEMIARTHROPLASTY SHOULDER FRACTURE Left 1992  . HOLMIUM LASER APPLICATION Bilateral [id number]   Procedure: HOLMIUM LASER APPLICATION;  Surgeon: [name] Frock, MD;  Location: [facility];  Service: Urology;  Laterality: Bilateral;   Family History  Problem Relation Age of Onset  . Cancer Mother   . Atrial fibrillation Father   . Hyperlipidemia Father   . Hypertension Father   . Hypertension Sister   . Hyperlipidemia Sister   . Thyroid disease Sister    Social History   Socioeconomic History  . Marital status: Legally Separated    Spouse name: Not on file  . Number of children: 1  . Years of education: 13  . Highest education level: Not on file  Occupational History  . Not on file  Tobacco Use  . Smoking status: Never Smoker  . Smokeless tobacco: Never Used  Substance and Sexual Activity  . Alcohol use: No  . Drug use: No  . Sexual activity: Not on file  Other Topics Concern  . Not on file  Social History Narrative  . Not on file   Social Determinants of Health   Financial Resource Strain:   . Difficulty of Paying Living Expenses: Not on file  Food  Insecurity: No Food Insecurity  . Worried About Charity fundraiser in the Last Year: Never true  . Ran Out of Food in the Last Year: Never true  Transportation Needs:   . Lack of Transportation (Medical): Not on file  . Lack of Transportation (Non-Medical): Not on file  Physical Activity: Insufficiently Active  . Days of Exercise per Week: 3 days  . Minutes of Exercise per Session: 30 min  Stress: No Stress Concern Present  . Feeling of Stress : Only a little  Social Connections: Somewhat Isolated  . Frequency of Communication with Friends and Family: More than three times a week  . Frequency of Social Gatherings with Friends and Family: Twice a week  . Attends Religious Services: 1 to 4 times per year  . Active Member of Clubs or Organizations: No  . Attends Archivist Meetings: Never  . Marital Status: Separated    Activities of Daily Living In your present state of health, do you have any difficulty performing the following activities: 09/11/2019  Hearing? Y  Comment some, when more than 2 are talking  Vision? Darreld Mclean  Comment glasses  Difficulty concentrating or making decisions? N  Walking or climbing stairs? Y  Comment osteoporosis  Dressing or bathing? N  Doing errands, shopping? N  Preparing Food and eating ? N  Using the Toilet? N  In the past six months, have you accidently leaked urine? N  Do you have problems with loss of bowel control? N  Managing your Medications? N  Managing your Finances? N  Housekeeping or managing your Housekeeping? N  Some recent data might be hidden    Patient Education/ Literacy How often do you need to have someone help you when you read instructions, pamphlets, or other written materials from your doctor or pharmacy?: 1 - Never What is the last grade level you completed in school?: 12  Exercise Current Exercise Habits: Home exercise routine, Type of exercise: walking;treadmill, Time (Minutes): 30, Frequency (Times/Week): 3,  Weekly Exercise (Minutes/Week): 90, Intensity: Mild, Exercise limited by: None identified  Diet Patient reports consuming 3 meals a day and 0 snack(s) a day Patient reports that her primary diet is: Regular Patient reports that she does have regular access to food.   Depression Screen PHQ 2/9 Scores 08/23/2018 08/14/2018 10/30/2015  PHQ - 2 Score 0 0 0     Fall Risk Fall Risk  08/23/2018 08/14/2018 10/30/2015  Falls in the past year? 0 0 No     Objective:  [name] Page seemed alert and oriented and she participated appropriately during our telephone visit.  Blood Pressure Weight BMI  BP Readings from Last 3 Encounters:  01/28/19 (!) 151/83  08/23/18 131/73  08/14/18 132/83   Wt Readings from Last 3 Encounters:  01/27/19 138 lb (62.6 kg)  08/23/18 137 lb (62.1 kg)  08/14/18 137 lb 9.6 oz (62.4 kg)   BMI Readings from Last 1 Encounters:  01/27/19 25.24 kg/m    *Unable to obtain current vital signs, weight, and BMI due to telephone visit type  Hearing/Vision  . Aneita did not seem to have difficulty with hearing/understanding during the telephone conversation . Reports that she has had a formal eye exam by an eye care professional within the past year . Reports that she has not had a formal hearing evaluation within the past year *Unable to fully assess hearing and vision during telephone visit type  Cognitive Function: 6CIT Screen 09/11/2019  What Year? 0 points  What month? 0 points  What time? 0 points  Count back from 20 0 points  Months in reverse 0 points  Repeat phrase 0 points  Total Score 0   (Normal:0-7, Significant for Dysfunction: >8)  Normal Cognitive Function Screening: Yes   Immunization & Health Maintenance Record Immunization History  Administered Date(s) Administered  . Hepatitis B 04/21/1994  . Influenza, High Dose Seasonal PF 08/08/2015, 07/07/2018  . Influenza,inj,Quad PF,6+ Mos 08/11/2013  . Influenza-Unspecified 06/05/2014  . Pneumococcal  Conjugate-13 10/30/2015  . Pneumococcal Polysaccharide-23 08/14/2018    Health Maintenance  Topic Date Due  . Hepatitis C Screening  1951/03/11  . INFLUENZA VACCINE  04/21/2019  . COLONOSCOPY  09/21/2019  . TETANUS/TDAP  10/22/2019  . DEXA SCAN  10/25/2019  . MAMMOGRAM  11/08/2020  . PNA vac Low Risk Adult  Completed       Assessment  This is a routine wellness examination for [name] Page.  Health Maintenance: Due or Overdue Health Maintenance Due  Topic Date Due  . Hepatitis C Screening  29-Mar-1951  . INFLUENZA VACCINE  04/21/2019    [name] Page  does not need a referral for Community Assistance: Care Management:   no Social Work:    no Prescription Assistance:  no Nutrition/Diabetes Education:  no   Plan:  Personalized Goals Goals Addressed            This Visit's Progress   . COMPLETED: Have 3 meals a day      . Increase physical activity      . LIFESTYLE - DECREASE FALLS RISK   On track     Personalized Health Maintenance & Screening Recommendations  Colorectal cancer screening  Lung Cancer Screening Recommended: no (Low Dose CT Chest recommended if Age 35-80 years, 30 pack-year currently smoking OR have quit w/in past 15 years) Hepatitis C Screening recommended: yes HIV Screening recommended: no  Advanced Directives: Written information was not prepared per patient's request.  Referrals & Orders No orders of the defined types were placed in this encounter.   Follow-up Plan . Follow-up with [name] Norlander, DO as planned on 10/10/19.  Pt is due for Hep C screening. She needs colon/screening in Jan 2021.  Advanced directives were left up front so patient could pick up and read. Instructions given if interested in filling out.  Pt is just recently seperated and feels less stress and feels healthy.   No concerns or questions from the patient   I have personally reviewed and noted the following in the patient's chart:   . Medical and  social history . Use of alcohol, tobacco or illicit drugs  . Current medications and supplements . Functional ability and status . Nutritional status . Physical activity . Advanced directives . List of other physicians . Hospitalizations, surgeries, and ER visits in previous 12 months . Vitals . Screenings to include cognitive, depression, and falls . Referrals and appointments  In addition, I have reviewed and discussed with [name] Page certain preventive protocols, quality metrics, and best practice recommendations. A written personalized care plan for preventive services as well as general preventive health recommendations is available and can be mailed to the patient at her request.      Rana Snare, LPN  [id number]

## 2019-10-09 ENCOUNTER — Other Ambulatory Visit: Payer: Self-pay

## 2019-10-10 ENCOUNTER — Ambulatory Visit (INDEPENDENT_AMBULATORY_CARE_PROVIDER_SITE_OTHER): Payer: Medicare Other

## 2019-10-10 ENCOUNTER — Ambulatory Visit (INDEPENDENT_AMBULATORY_CARE_PROVIDER_SITE_OTHER): Payer: Medicare Other | Admitting: Family Medicine

## 2019-10-10 ENCOUNTER — Encounter: Payer: Self-pay | Admitting: Family Medicine

## 2019-10-10 VITALS — BP 138/78 | HR 75 | Temp 98.6°F | Ht 62.0 in | Wt 142.0 lb

## 2019-10-10 DIAGNOSIS — Z7689 Persons encountering health services in other specified circumstances: Secondary | ICD-10-CM | POA: Diagnosis not present

## 2019-10-10 DIAGNOSIS — M810 Age-related osteoporosis without current pathological fracture: Secondary | ICD-10-CM

## 2019-10-10 DIAGNOSIS — Z0000 Encounter for general adult medical examination without abnormal findings: Secondary | ICD-10-CM | POA: Diagnosis not present

## 2019-10-10 DIAGNOSIS — R251 Tremor, unspecified: Secondary | ICD-10-CM

## 2019-10-10 DIAGNOSIS — D649 Anemia, unspecified: Secondary | ICD-10-CM

## 2019-10-10 DIAGNOSIS — Z13220 Encounter for screening for lipoid disorders: Secondary | ICD-10-CM

## 2019-10-10 DIAGNOSIS — H6123 Impacted cerumen, bilateral: Secondary | ICD-10-CM

## 2019-10-10 DIAGNOSIS — H9313 Tinnitus, bilateral: Secondary | ICD-10-CM | POA: Diagnosis not present

## 2019-10-10 NOTE — Progress Notes (Signed)
Subjective: CC: est care new doctor. Osteoporosis, general check up PCP: Janora Norlander, DO KDT:OIZTIW P Braaten is a 69 y.o. female presenting to clinic today for:  1. Osteoporosis Patient reports about a 10-year history of osteoporosis.  She was on Fosamax for about 5 years before taking a drug holiday.  She had last DEXA scan in 2016 which demonstrated a T score of -3.8.  She was subsequently put back on Fosamax and has been taking this consistently since.  She is on a supplemental calcium and vitamin D.  She feels that she maintains a fairly healthy diet and tries to stay physically active.  Prolia was considered but bisphosphonate was pursued.  2.  Tinnitus Patient reports several history of ringing in her ears bilaterally.  No worsening.  She does not feel that she has poor hearing.  She takes Claritin intermittently.  She is never seen an audiologist or ENT doctor for this.  3.  Tremor Patient reports several history of tremor.  It is predominantly in the left hand but has started becoming prominent in the right.  If she puts her weight on her tiptoes when she is seated, she also notes a tremor in the legs.  Her father does have a tremor but was never diagnosed with Parkinson disease.  She wonders if there is any treatment for this.  Denies any change in handwriting, gait.  4. Preventative She notes having had her colonoscopy less than 10 years ago and was normal.  This was in [place] but she is unsure as to where it was performed.  She is due for bone density scan.  ROS: Per HPI  Allergies  Allergen Reactions  . Sulfa Antibiotics Rash   Past Medical History:  Diagnosis Date  . Arthritis   . Basal cell carcinoma   . 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 Cancer ([place])    melanoma  . Dysuria   . Frequency of urination   . Hematuria   . History of kidney stones   . Osteoporosis   . Renal calculus, bilateral   . 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 Urgency of urination   . Wears glasses     Current Outpatient Medications:  .  alendronate (FOSAMAX) 70 MG tablet, Take 1 tablet (70 mg total) by mouth every 7 (seven) days. Reported on 10/30/2015 (Patient taking differently: Take 70 mg by mouth every Monday. ), Disp: 12 tablet, Rfl: 4 .  Calcium Carbonate-Vit D-Min (CALCIUM 1200 PO), Take 1 tablet by mouth daily., Disp: , Rfl:  .  Glycerin-Hypromellose-PEG 400 ([facility] DRY EYE RELIEF) 0.2-0.2-1 % SOLN, Apply 1 drop to eye 2 (two) times daily as needed (dry eyes). , Disp: , Rfl:  .  ibuprofen (ADVIL,MOTRIN) 200 MG tablet, Take 200-400 mg by mouth every 6 (six) hours as needed for moderate pain., Disp: , Rfl:  .  loratadine (CLARITIN) 10 MG tablet, Take 10 mg by mouth daily as needed for allergies., Disp: , Rfl:  .  Multiple Vitamin (MULTIVITAMIN WITH MINERALS) TABS tablet, Take 1 tablet by mouth daily., Disp: , Rfl:  Social History   Socioeconomic History  . Marital status: Legally Separated    Spouse name: Not on file  . Number of children: 1  . Years of education: 53  . Highest education level: Not on file  Occupational History  . Not on file  Tobacco Use  . Smoking  status: Never Smoker  . Smokeless tobacco: Never Used  Substance and Sexual Activity  . Alcohol use: No  . Drug use: No  . Sexual activity: Not on file  Other Topics Concern  . Not on file  Social History Narrative   Patient resides locally.  She is currently separated from her spouse.  She is living in a mobile home near her daughter and 2 grandchildren and her father.   Social Determinants of Health   Financial Resource Strain:   . Difficulty of Paying Living Expenses: Not on file  Food Insecurity: No Food Insecurity  . Worried About Charity fundraiser in the Last Year: Never true  . Ran Out of Food in the Last Year: Never true  Transportation Needs:   . Lack of Transportation (Medical): Not on file  . Lack of Transportation (Non-Medical):  Not on file  Physical Activity: Insufficiently Active  . Days of Exercise per Week: 3 days  . Minutes of Exercise per Session: 30 min  Stress: No Stress Concern Present  . Feeling of Stress : Only a little  Social Connections: Somewhat Isolated  . Frequency of Communication with Friends and Family: More than three times a week  . Frequency of Social Gatherings with Friends and Family: Twice a week  . Attends Religious Services: 1 to 4 times per year  . Active Member of Clubs or Organizations: No  . Attends Archivist Meetings: Never  . Marital Status: Separated  Intimate Partner Violence: Not At Risk  . Fear of Current or Ex-Partner: No  . Emotionally Abused: No  . Physically Abused: No  . Sexually Abused: No   Family History  Problem Relation Age of Onset  . Cancer Mother   . Atrial fibrillation Father   . Hyperlipidemia Father   . Hypertension Father   . Hypertension Sister   . Hyperlipidemia Sister   . Thyroid disease Sister     Objective: Office vital signs reviewed. BP 138/78   Pulse 75   Temp 98.6 F (37 C) (Temporal)   Ht '5\' 2"'  (1.575 m)   Wt 142 lb (64.4 kg)   SpO2 98%   BMI 25.97 kg/m   Physical Examination:  General: Awake, alert, well nourished, No acute distress HEENT: Normal    Neck: No masses palpated. No lymphadenopathy    Ears: TMs are occluded bilaterally by cerumen.    Eyes: PERRLA, extraocular membranes intact, sclera white    Nose: nasal turbinates moist, no nasal discharge    Throat: moist mucus membranes, no erythema, no tonsillar exudate.  Airway is patent.  Artificial bridge noted Cardio: regular rate and rhythm, S1S2 heard, no murmurs appreciated Pulm: clear to auscultation bilaterally, no wheezes, rhonchi or rales; normal work of breathing on room air GI: soft, non-tender, non-distended, bowel sounds present x4, no hepatomegaly, no splenomegaly, no masses Extremities: warm, well perfused, No edema, cyanosis or clubbing; +2  pulses bilaterally MSK: normal gait and station Skin: dry; intact; no rashes or lesions Neuro: Intention tremor noted.  No focal neurologic deficits.  Patellar DTRs 4 bilaterally. Psych: Mood stable, speech normal, affect appropriate, pleasant and interactive  Depression screen [facility] 2/9 10/10/2019 08/23/2018 08/14/2018  Decreased Interest 1 0 0  Down, Depressed, Hopeless 1 0 0  PHQ - 2 Score 2 0 0  Altered sleeping 0 - -  Tired, decreased energy 0 - -  Change in appetite 0 - -  Feeling bad or failure about yourself  0 - -  Trouble concentrating 0 - -  Moving slowly or fidgety/restless 0 - -  Suicidal thoughts 0 - -  PHQ-9 Score 2 - -  Difficult doing work/chores Not difficult at all - -    Assessment/ Plan: 69 y.o. female   1. Annual physical exam  2. Age-related osteoporosis without current pathological fracture Check labs.  May be due for bisphosphonate holiday again soon.  Would plan for transition over to Prolia.  Check DEXA. - CMP14+EGFR - VITAMIN D 25 Hydroxy (Vit-D Deficiency, Fractures) - TSH - DG [facility] DEXA  3. Establishing care with new doctor, encounter for  4. Anemia, unspecified type Remote history of anemia in the past.  Check CBC - CBC  5. Screening, lipid Check nonfasting lipid panel - Lipid Panel  6. Bilateral impacted cerumen Left side was successfully removed.  TM appears normal.  Right side not successfully removed.  I recommended use of Debrox on the right.  May return in 1 week if desires irrigation again.  7. Tinnitus of both ears Possibly related to wax buildup.  I offered referral to ENT should she desire going forward.  Could trialed Claritin consistently to see if perhaps this is related to fluid buildup  8. Tremor I think this to be an essential tremor.  We discussed the possibility of beta-blockers but given her hypotension with low-dose indapamide for renal stones in the past, I am not sure that her blood pressure would tolerate a low-dose  beta-blocker.  We discussed consideration for eval by neurology as well.  She would like to think about this and she will get back to me on what her plan is   Orders Placed This Encounter  Procedures  . DG [facility] DEXA    Order Specific Question:   Reason for Exam (SYMPTOM  OR DIAGNOSIS REQUIRED)    Answer:   osteoporosis  . CMP14+EGFR  . VITAMIN D 25 Hydroxy (Vit-D Deficiency, Fractures)  . TSH  . CBC  . Lipid Panel   No orders of the defined types were placed in this encounter.    Janora Norlander, DO [place] [phone]

## 2019-10-10 NOTE — Patient Instructions (Signed)
I believe you have an essential tremor.  Sometimes this is treated with beta blockers (can lower blood pressure and often lower heart rate).  We discussed possibly being evaluated by Neurology if you desire.    For your ear ringing.  The wax may be contributing but sometimes this is related to age/ allergies.  Try taking the Claritin daily to see if this is improved.   Tinnitus Tinnitus refers to hearing a sound when there is no actual source for that sound. This is often described as ringing in the ears. However, people with this condition may hear a variety of noises, in one ear or in both ears. The sounds of tinnitus can be soft, loud, or somewhere in between. Tinnitus can last for a few seconds or can be constant for days. It may go away without treatment and come back at various times. When tinnitus is constant or happens often, it can lead to other problems, such as trouble sleeping and trouble concentrating. Almost everyone experiences tinnitus at some point. Tinnitus that is long-lasting (chronic) or comes back often (recurs) may require medical attention. What are the causes? The cause of tinnitus is often not known. In some cases, it can result from:  Exposure to loud noises from machinery, music, or other sources.  An object (foreign body) stuck in the ear.  Earwax buildup.  Drinking alcohol or caffeine.  Taking certain medicines.  Age-related hearing loss. It may also be caused by medical conditions such as:  Ear or sinus infections.  High blood pressure.  Heart diseases.  Anemia.  Allergies.  Meniere's disease.  Thyroid problems.  Tumors.  A weak, bulging blood vessel (aneurysm) near the ear. What are the signs or symptoms? The main symptom of tinnitus is hearing a sound when there is no source for that sound. It may sound like:  Buzzing.  Roaring.  Ringing.  Blowing air.  Hissing.  Whistling.  Sizzling.  Humming.  Running water.  A musical  note.  Tapping. Symptoms may affect only one ear (unilateral) or both ears (bilateral). How is this diagnosed? Tinnitus is diagnosed based on your symptoms, your medical history, and a physical exam. Your health care provider may do a thorough hearing test (audiologic exam) if your tinnitus:  Is unilateral.  Causes hearing difficulties.  Lasts 6 months or longer. You may work with a health care provider who specializes in hearing disorders (audiologist). You may be asked questions about your symptoms and how they affect your daily life. You may have other tests done, such as:  CT scan.  MRI.  An imaging test of how blood flows through your blood vessels (angiogram). How is this treated? Treating an underlying medical condition can sometimes make tinnitus go away. If your tinnitus continues, other treatments may include:  Medicines.  Therapy and counseling to help you manage the stress of living with tinnitus.  Sound generators to mask the tinnitus. These include: ? Tabletop sound machines that play relaxing sounds to help you fall asleep. ? Wearable devices that fit in your ear and play sounds or music. ? Acoustic neural stimulation. This involves using headphones to listen to music that contains an auditory signal. Over time, listening to this signal may change some pathways in your brain and make you less sensitive to tinnitus. This treatment is used for very severe cases when no other treatment is working.  Using hearing aids or cochlear implants if your tinnitus is related to hearing loss. Hearing aids are worn  in the outer ear. Cochlear implants are surgically placed in the inner ear. Follow these instructions at home: Managing symptoms      When possible, avoid being in loud places and being exposed to loud sounds.  Wear hearing protection, such as earplugs, when you are exposed to loud noises.  Use a white noise machine, a humidifier, or other devices to mask the  sound of tinnitus.  Practice techniques for reducing stress, such as meditation, yoga, or deep breathing. Work with your health care provider if you need help with managing stress.  Sleep with your head slightly raised. This may reduce the impact of tinnitus. General instructions  Do not use stimulants, such as nicotine, alcohol, or caffeine. Talk with your health care provider about other stimulants to avoid. Stimulants are substances that can make you feel alert and attentive by increasing certain activities in the body (such as heart rate and blood pressure). These substances may make tinnitus worse.  Take over-the-counter and prescription medicines only as told by your health care provider.  Try to get plenty of sleep each night.  Keep all follow-up visits as told by your health care provider. This is important. Contact a health care provider if:  Your tinnitus continues for 3 weeks or longer without stopping.  You develop sudden hearing loss.  Your symptoms get worse or do not get better with home care.  You feel you are not able to manage the stress of living with tinnitus. Get help right away if:  You develop tinnitus after a head injury.  You have tinnitus along with any of the following: ? Dizziness. ? Loss of balance. ? Nausea and vomiting. ? Sudden, severe headache. These symptoms may represent a serious problem that is an emergency. Do not wait to see if the symptoms will go away. Get medical help right away. Call your local emergency services (911 in the [place] Do not drive yourself to the hospital. Summary  Tinnitus refers to hearing a sound when there is no actual source for that sound. This is often described as ringing in the ears.  Symptoms may affect only one ear (unilateral) or both ears (bilateral).  Use a white noise machine, a humidifier, or other devices to mask the sound of tinnitus.  Do not use stimulants, such as nicotine, alcohol, or caffeine. Talk  with your health care provider about other stimulants to avoid. These substances may make tinnitus worse. This information is not intended to replace advice given to you by your health care provider. Make sure you discuss any questions you have with your health care provider. Document Revised: 03/21/2019 Document Reviewed: 06/16/2017 Elsevier Patient Education  [place].   Essential Tremor A tremor is trembling or shaking that a person cannot control. Most tremors affect the hands or arms. Tremors can also affect the head, vocal cords, legs, and other parts of the body. Essential tremor is a tremor without a known cause. Usually, it occurs while a person is trying to perform an action. It tends to get worse gradually as a person ages. What are the causes? The cause of this condition is not known. What increases the risk? You are more likely to develop this condition if:  You have a family member with essential tremor.  You are age 54 or older.  You take certain medicines. What are the signs or symptoms? The main sign of a tremor is a rhythmic shaking of certain parts of your body that is uncontrolled and  unintentional. You may:  Have difficulty eating with a spoon or fork.  Have difficulty writing.  Nod your head up and down or side to side.  Have a quivering voice. The shaking may:  Get worse over time.  Come and go.  Be more noticeable on one side of your body.  Get worse due to stress, fatigue, caffeine, and extreme heat or cold. How is this diagnosed? This condition may be diagnosed based on:  Your symptoms and medical history.  A physical exam. There is no single test to diagnose an essential tremor. However, your health care provider may order tests to rule out other causes of your condition. These may include:  Blood and urine tests.  Imaging studies of your brain, such as CT scan and MRI.  A test that measures involuntary muscle movement  (electromyogram). How is this treated? Treatment for essential tremor depends on the severity of the condition.  Some tremors may go away without treatment.  Mild tremors may not need treatment if they do not affect your day-to-day life.  Severe tremors may need to be treated using one or more of the following options: ? Medicines. ? Lifestyle changes. ? Occupational or physical therapy. Follow these instructions at home: Lifestyle   Do not use any products that contain nicotine or tobacco, such as cigarettes and e-cigarettes. If you need help quitting, ask your health care provider.  Limit your caffeine intake as told by your health care provider.  Try to get 8 hours of sleep each night.  Find ways to manage your stress that fits your lifestyle and personality. Consider trying meditation or yoga.  Try to anticipate stressful situations and allow extra time to manage them.  If you are struggling emotionally with the effects of your tremor, consider working with a mental health provider. General instructions  Take over-the-counter and prescription medicines only as told by your health care provider.  Avoid extreme heat and extreme cold.  Keep all follow-up visits as told by your health care provider. This is important. Visits may include physical therapy visits. Contact a health care provider if:  You experience any changes in the location or intensity of your tremors.  You start having a tremor after starting a new medicine.  You have tremor with other symptoms, such as: ? Numbness. ? Tingling. ? Pain. ? Weakness.  Your tremor gets worse.  Your tremor interferes with your daily life.  You feel down, blue, or sad for at least 2 weeks in a row.  Worrying about your tremor and what other people think about you interferes with your everyday life functions, including relationships, work, or school. Summary  Essential tremor is a tremor without a known cause. Usually,  it occurs when you are trying to perform an action.  The cause of this condition is not known.  The main sign of a tremor is a rhythmic shaking of certain parts of your body that is uncontrolled and unintentional.  Treatment for essential tremor depends on the severity of the condition. This information is not intended to replace advice given to you by your health care provider. Make sure you discuss any questions you have with your health care provider. Document Revised: 09/16/2017 Document Reviewed: 09/16/2017 Elsevier Patient Education  2020 [employer].

## 2019-10-11 LAB — CMP14+EGFR
ALT: 12 IU/L (ref 0–32)
AST: 20 IU/L (ref 0–40)
Albumin/Globulin Ratio: 1.5 (ref 1.2–2.2)
Albumin: 4.4 g/dL (ref 3.8–4.8)
Alkaline Phosphatase: 134 IU/L — ABNORMAL HIGH (ref 39–117)
BUN/Creatinine Ratio: 24 (ref 12–28)
BUN: 23 mg/dL (ref 8–27)
Bilirubin Total: 0.4 mg/dL (ref 0.0–1.2)
CO2: 26 mmol/L (ref 20–29)
Calcium: 9.8 mg/dL (ref 8.7–10.3)
Chloride: 104 mmol/L (ref 96–106)
Creatinine, Ser: 0.97 mg/dL (ref 0.57–1.00)
GFR calc Af Amer: 69 mL/min/{1.73_m2} (ref >59)
GFR calc non Af Amer: 60 mL/min/{1.73_m2} (ref >59)
Globulin, Total: 3 g/dL (ref 1.5–4.5)
Glucose: 89 mg/dL (ref 65–99)
Potassium: 4.9 mmol/L (ref 3.5–5.2)
Sodium: 143 mmol/L (ref 134–144)
Total Protein: 7.4 g/dL (ref 6.0–8.5)

## 2019-10-11 LAB — CBC
Hematocrit: 40.3 % (ref 34.0–46.6)
Hemoglobin: 12.7 g/dL (ref 11.1–15.9)
MCH: 25.7 pg — ABNORMAL LOW (ref 26.6–33.0)
MCHC: 31.5 g/dL (ref 31.5–35.7)
MCV: 82 fL (ref 79–97)
Platelets: 267 10*3/uL (ref 150–450)
RBC: 4.94 x10E6/uL (ref 3.77–5.28)
RDW: 13.8 % (ref 11.7–15.4)
WBC: 7.5 10*3/uL (ref 3.4–10.8)

## 2019-10-11 LAB — LIPID PANEL
Chol/HDL Ratio: 3 ratio (ref 0.0–4.4)
Cholesterol, Total: 219 mg/dL — ABNORMAL HIGH (ref 100–199)
HDL: 72 mg/dL (ref >39)
LDL Chol Calc (NIH): 126 mg/dL — ABNORMAL HIGH (ref 0–99)
Triglycerides: 123 mg/dL (ref 0–149)
VLDL Cholesterol Cal: 21 mg/dL (ref 5–40)

## 2019-10-11 LAB — TSH: TSH: 0.867 u[IU]/mL (ref 0.450–4.500)

## 2019-10-11 LAB — VITAMIN D 25 HYDROXY (VIT D DEFICIENCY, FRACTURES): Vit D, 25-Hydroxy: 36.1 ng/mL (ref 30.0–100.0)

## 2019-10-12 ENCOUNTER — Other Ambulatory Visit: Payer: Self-pay | Admitting: Family Medicine

## 2019-10-12 DIAGNOSIS — E782 Mixed hyperlipidemia: Secondary | ICD-10-CM

## 2019-10-12 MED ORDER — ATORVASTATIN CALCIUM 20 MG PO TABS: 20.0000 mg | ORAL_TABLET | Freq: Every day | ORAL | 0 refills | Status: DC

## 2019-10-30 ENCOUNTER — Other Ambulatory Visit: Payer: Self-pay | Admitting: *Deleted

## 2019-10-30 DIAGNOSIS — M810 Age-related osteoporosis without current pathological fracture: Secondary | ICD-10-CM

## 2019-10-30 MED ORDER — ALENDRONATE SODIUM 70 MG PO TABS: 70.0000 mg | ORAL_TABLET | ORAL | 1 refills | Status: DC

## 2020-01-05 ENCOUNTER — Other Ambulatory Visit: Payer: Self-pay | Admitting: Family Medicine

## 2020-01-05 DIAGNOSIS — E782 Mixed hyperlipidemia: Secondary | ICD-10-CM

## 2020-03-30 ENCOUNTER — Other Ambulatory Visit: Payer: Self-pay | Admitting: Family Medicine

## 2020-03-30 DIAGNOSIS — M810 Age-related osteoporosis without current pathological fracture: Secondary | ICD-10-CM

## 2020-03-30 DIAGNOSIS — E782 Mixed hyperlipidemia: Secondary | ICD-10-CM

## 2020-06-25 ENCOUNTER — Other Ambulatory Visit: Payer: Self-pay | Admitting: Family Medicine

## 2020-06-25 DIAGNOSIS — E782 Mixed hyperlipidemia: Secondary | ICD-10-CM

## 2020-07-15 DIAGNOSIS — R4020 Unspecified coma: Secondary | ICD-10-CM | POA: Diagnosis not present

## 2020-07-15 DIAGNOSIS — R42 Dizziness and giddiness: Secondary | ICD-10-CM | POA: Diagnosis not present

## 2020-07-16 ENCOUNTER — Ambulatory Visit: Payer: Medicare Other | Admitting: Nurse Practitioner

## 2020-07-18 ENCOUNTER — Encounter: Payer: Self-pay | Admitting: Nurse Practitioner

## 2020-07-18 ENCOUNTER — Ambulatory Visit (INDEPENDENT_AMBULATORY_CARE_PROVIDER_SITE_OTHER): Payer: Medicare Other | Admitting: Nurse Practitioner

## 2020-07-18 ENCOUNTER — Other Ambulatory Visit: Payer: Self-pay

## 2020-07-18 VITALS — BP 128/69 | HR 86 | Temp 97.3°F | Ht 62.0 in | Wt 136.0 lb

## 2020-07-18 DIAGNOSIS — Z23 Encounter for immunization: Secondary | ICD-10-CM

## 2020-07-18 DIAGNOSIS — F321 Major depressive disorder, single episode, moderate: Secondary | ICD-10-CM

## 2020-07-18 DIAGNOSIS — D500 Iron deficiency anemia secondary to blood loss (chronic): Secondary | ICD-10-CM | POA: Diagnosis not present

## 2020-07-18 MED ORDER — ESCITALOPRAM OXALATE 10 MG PO TABS: 10.0000 mg | ORAL_TABLET | Freq: Every day | ORAL | 5 refills | Status: DC

## 2020-07-18 NOTE — Patient Instructions (Signed)
Stress, Adult Stress is a normal reaction to life events. Stress is what you feel when life demands more than you are used to, or more than you think you can handle. Some stress can be useful, such as studying for a test or meeting a deadline at work. Stress that occurs too often or for too long can cause problems. It can affect your emotional health and interfere with relationships and normal daily activities. Too much stress can weaken your body's defense system (immune system) and increase your risk for physical illness. If you already have a medical problem, stress can make it worse. What are the causes? All sorts of life events can cause stress. An event that causes stress for one person may not be stressful for another person. Major life events, whether positive or negative, commonly cause stress. Examples include:  Losing a job or starting a new job.  Losing a loved one.  Moving to a new town or home.  Getting married or divorced.  Having a baby.  Getting injured or sick. Less obvious life events can also cause stress, especially if they occur day after day or in combination with each other. Examples include:  Working long hours.  Driving in traffic.  Caring for children.  Being in debt.  Being in a difficult relationship. What are the signs or symptoms? Stress can cause emotional symptoms, including:  Anxiety. This is feeling worried, afraid, on edge, overwhelmed, or out of control.  Anger, including irritation or impatience.  Depression. This is feeling sad, down, helpless, or guilty.  Trouble focusing, remembering, or making decisions. Stress can cause physical symptoms, including:  Aches and pains. These may affect your head, neck, back, stomach, or other areas of your body.  Tight muscles or a clenched jaw.  Low energy.  Trouble sleeping. Stress can cause unhealthy behaviors, including:  Eating to feel better (overeating) or skipping meals.  Working too  much or putting off tasks.  Smoking, drinking alcohol, or using drugs to feel better. How is this diagnosed? Stress is diagnosed through an assessment by your health care provider. He or she may diagnose this condition based on:  Your symptoms and any stressful life events.  Your medical history.  Tests to rule out other causes of your symptoms. Depending on your condition, your health care provider may refer you to a specialist for further evaluation. How is this treated?  Stress management techniques are the recommended treatment for stress. Medicine is not typically recommended for the treatment of stress. Techniques to reduce your reaction to stressful life events include:  Stress identification. Monitor yourself for symptoms of stress and identify what causes stress for you. These skills may help you to avoid or prepare for stressful events.  Time management. Set your priorities, keep a calendar of events, and learn to say no. Taking these actions can help you avoid making too many commitments. Techniques for coping with stress include:  Rethinking the problem. Try to think realistically about stressful events rather than ignoring them or overreacting. Try to find the positives in a stressful situation rather than focusing on the negatives.  Exercise. Physical exercise can release both physical and emotional tension. The key is to find a form of exercise that you enjoy and do it regularly.  Relaxation techniques. These relax the body and mind. The key is to find one or more that you enjoy and use the techniques regularly. Examples include: ? Meditation, deep breathing, or progressive relaxation techniques. ? Yoga or   tai chi. ? Biofeedback, mindfulness techniques, or journaling. ? Listening to music, being out in nature, or participating in other hobbies.  Practicing a healthy lifestyle. Eat a balanced diet, drink plenty of water, limit or avoid caffeine, and get plenty of  sleep.  Having a strong support network. Spend time with family, friends, or other people you enjoy being around. Express your feelings and talk things over with someone you trust. Counseling or talk therapy with a mental health professional may be helpful if you are having trouble managing stress on your own. Follow these instructions at home: Lifestyle   Avoid drugs.  Do not use any products that contain nicotine or tobacco, such as cigarettes, e-cigarettes, and chewing tobacco. If you need help quitting, ask your health care provider.  Limit alcohol intake to no more than 1 drink a day for nonpregnant women and 2 drinks a day for men. One drink equals 12 oz of beer, 5 oz of wine, or 1 oz of hard liquor  Do not use alcohol or drugs to relax.  Eat a balanced diet that includes fresh fruits and vegetables, whole grains, lean meats, fish, eggs, and beans, and low-fat dairy. Avoid processed foods and foods high in added fat, sugar, and salt.  Exercise at least 30 minutes on 5 or more days each week.  Get 7-8 hours of sleep each night. General instructions   Practice stress management techniques as discussed with your health care provider.  Drink enough fluid to keep your urine clear or pale yellow.  Take over-the-counter and prescription medicines only as told by your health care provider.  Keep all follow-up visits as told by your health care provider. This is important. Contact a health care provider if:  Your symptoms get worse.  You have new symptoms.  You feel overwhelmed by your problems and can no longer manage them on your own. Get help right away if:  You have thoughts of hurting yourself or others. If you ever feel like you may hurt yourself or others, or have thoughts about taking your own life, get help right away. You can go to your nearest emergency department or call:  Your local emergency services (911 in the [place]  A suicide crisis helpline, such as the  [place] at (316) 250-6172. This is open 24 hours a day. Summary  Stress is a normal reaction to life events. It can cause problems if it happens too often or for too long.  Practicing stress management techniques is the best way to treat stress.  Counseling or talk therapy with a mental health professional may be helpful if you are having trouble managing stress on your own. This information is not intended to replace advice given to you by your health care provider. Make sure you discuss any questions you have with your health care provider. Document Revised: 04/06/2019 Document Reviewed: 10/27/2016 Elsevier Patient Education  [place].

## 2020-07-18 NOTE — Progress Notes (Signed)
Subjective:    Patient ID: [name] Page, female    DOB: 1951-04-12, 69 y.o.   MRN: [id number]   Chief Complaint: Fatigue   HPI Patient says that she feels very tired all the time. Doe snot feel like doing anything or going anywhere. Wants to lay around and sleep all the tie. She recently got a divorce and her ex husband is bothering her all the time. Asking for money. Depression screen [facility] 2/9 07/18/2020 10/10/2019 08/23/2018  Decreased Interest 3 1 0  Down, Depressed, Hopeless 2 1 0  PHQ - 2 Score 5 2 0  Altered sleeping 2 0 -  Tired, decreased energy 3 0 -  Change in appetite 0 0 -  Feeling bad or failure about yourself  2 0 -  Trouble concentrating 2 0 -  Moving slowly or fidgety/restless 1 0 -  Suicidal thoughts 0 0 -  PHQ-9 Score 15 2 -  Difficult doing work/chores Very difficult Not difficult at all -      Review of Systems  Constitutional: Negative for diaphoresis.  Eyes: Negative for pain.  Respiratory: Negative for shortness of breath.   Cardiovascular: Negative for chest pain, palpitations and leg swelling.  Gastrointestinal: Negative for abdominal pain.  Endocrine: Negative for polydipsia.  Skin: Negative for rash.  Neurological: Negative for dizziness, weakness and headaches.  Hematological: Does not bruise/bleed easily.  All other systems reviewed and are negative.      Objective:   Physical Exam Vitals and nursing note reviewed.  Constitutional:      General: She is not in acute distress.    Appearance: Normal appearance. She is well-developed.  Neck:     Vascular: No carotid bruit or JVD.  Cardiovascular:     Rate and Rhythm: Normal rate and regular rhythm.     Heart sounds: Normal heart sounds.  Pulmonary:     Effort: Pulmonary effort is normal. No respiratory distress.     Breath sounds: Normal breath sounds. No wheezing or rales.  Chest:     Chest wall: No tenderness.  Abdominal:     General: Bowel sounds are normal. There is no distension  or abdominal bruit.     Palpations: Abdomen is soft. There is no hepatomegaly, splenomegaly, mass or pulsatile mass.     Tenderness: There is no abdominal tenderness.  Musculoskeletal:        General: Normal range of motion.     Cervical back: Normal range of motion and neck supple.  Lymphadenopathy:     Cervical: No cervical adenopathy.  Skin:    General: Skin is warm and dry.  Neurological:     Mental Status: She is alert and oriented to person, place, and time.     Deep Tendon Reflexes: Reflexes are normal and symmetric.  Psychiatric:        Behavior: Behavior normal.        Thought Content: Thought content normal.        Judgment: Judgment normal.    BP 128/69   Pulse 86   Temp (!) 97.3 F (36.3 C) (Temporal)   Ht 5\' 2"  (1.575 m)   Wt 136 lb (61.7 kg)   BMI 24.87 kg/m         Assessment & Plan:  [name] Page in today with chief complaint of Fatigue   1. Depression, major, single episode, moderate (HCC) Stress management Eat 3 balanced meals a day Daily exercise Meds ordered this encounter  Medications  .  escitalopram (LEXAPRO) 10 MG tablet    Sig: Take 1 tablet (10 mg total) by mouth daily.    Dispense:  30 tablet    Refill:  5    Order Specific Question:   Supervising Provider    Answer:   Caryl Pina A [[id number]]       The above assessment and management plan was discussed with the patient. The patient verbalized understanding of and has agreed to the management plan. Patient is aware to call the clinic if symptoms persist or worsen. Patient is aware when to return to the clinic for a follow-up visit. Patient educated on when it is appropriate to go to the emergency department.   Mary-Margaret Hassell Done, FNP

## 2020-07-19 ENCOUNTER — Other Ambulatory Visit: Payer: Self-pay | Admitting: Family Medicine

## 2020-07-19 DIAGNOSIS — E782 Mixed hyperlipidemia: Secondary | ICD-10-CM

## 2020-07-19 LAB — CMP14+EGFR
ALT: 10 IU/L (ref 0–32)
AST: 17 IU/L (ref 0–40)
Albumin/Globulin Ratio: 1.1 — ABNORMAL LOW (ref 1.2–2.2)
Albumin: 4.1 g/dL (ref 3.8–4.8)
Alkaline Phosphatase: 155 IU/L — ABNORMAL HIGH (ref 44–121)
BUN/Creatinine Ratio: 22 (ref 12–28)
BUN: 21 mg/dL (ref 8–27)
Bilirubin Total: 0.4 mg/dL (ref 0.0–1.2)
CO2: 20 mmol/L (ref 20–29)
Calcium: 9.3 mg/dL (ref 8.7–10.3)
Chloride: 107 mmol/L — ABNORMAL HIGH (ref 96–106)
Creatinine, Ser: 0.96 mg/dL (ref 0.57–1.00)
GFR calc Af Amer: 69 mL/min/{1.73_m2} (ref >59)
GFR calc non Af Amer: 60 mL/min/{1.73_m2} (ref >59)
Globulin, Total: 3.8 g/dL (ref 1.5–4.5)
Glucose: 105 mg/dL — ABNORMAL HIGH (ref 65–99)
Potassium: 4.9 mmol/L (ref 3.5–5.2)
Sodium: 143 mmol/L (ref 134–144)
Total Protein: 7.9 g/dL (ref 6.0–8.5)

## 2020-07-19 LAB — CBC WITH DIFFERENTIAL/PLATELET
Basophils Absolute: 0.1 10*3/uL (ref 0.0–0.2)
Basos: 1 %
EOS (ABSOLUTE): 0.1 10*3/uL (ref 0.0–0.4)
Eos: 1 %
Hematocrit: 26.6 % — ABNORMAL LOW (ref 34.0–46.6)
Hemoglobin: 7.5 g/dL — CL (ref 11.1–15.9)
Immature Grans (Abs): 0 10*3/uL (ref 0.0–0.1)
Immature Granulocytes: 0 %
Lymphocytes Absolute: 1.4 10*3/uL (ref 0.7–3.1)
Lymphs: 14 %
MCH: 19.6 pg — ABNORMAL LOW (ref 26.6–33.0)
MCHC: 28.2 g/dL — ABNORMAL LOW (ref 31.5–35.7)
MCV: 70 fL — ABNORMAL LOW (ref 79–97)
Monocytes Absolute: 0.8 10*3/uL (ref 0.1–0.9)
Monocytes: 8 %
Neutrophils Absolute: 7.9 10*3/uL — ABNORMAL HIGH (ref 1.4–7.0)
Neutrophils: 76 %
Platelets: 407 10*3/uL (ref 150–450)
RBC: 3.82 x10E6/uL (ref 3.77–5.28)
RDW: 18 % — ABNORMAL HIGH (ref 11.7–15.4)
WBC: 10.3 10*3/uL (ref 3.4–10.8)

## 2020-07-19 LAB — THYROID PANEL WITH TSH
Free Thyroxine Index: 2 (ref 1.2–4.9)
T3 Uptake Ratio: 24 % (ref 24–39)
T4, Total: 8.4 ug/dL (ref 4.5–12.0)
TSH: 1.27 u[IU]/mL (ref 0.450–4.500)

## 2020-07-21 ENCOUNTER — Telehealth: Payer: Self-pay | Admitting: Family Medicine

## 2020-07-21 ENCOUNTER — Other Ambulatory Visit: Payer: Medicare Other

## 2020-07-21 ENCOUNTER — Other Ambulatory Visit: Payer: Self-pay

## 2020-07-21 DIAGNOSIS — D649 Anemia, unspecified: Secondary | ICD-10-CM

## 2020-07-21 LAB — CBC WITH DIFFERENTIAL/PLATELET
Basophils Absolute: 0.1 10*3/uL (ref 0.0–0.2)
Basos: 1 %
EOS (ABSOLUTE): 0.2 10*3/uL (ref 0.0–0.4)
Eos: 2 %
Hematocrit: 27.6 % — ABNORMAL LOW (ref 34.0–46.6)
Hemoglobin: 7.6 g/dL — CL (ref 11.1–15.9)
Immature Grans (Abs): 0 10*3/uL (ref 0.0–0.1)
Immature Granulocytes: 0 %
Lymphocytes Absolute: 2.2 10*3/uL (ref 0.7–3.1)
Lymphs: 21 %
MCH: 19.4 pg — ABNORMAL LOW (ref 26.6–33.0)
MCHC: 27.5 g/dL — ABNORMAL LOW (ref 31.5–35.7)
MCV: 71 fL — ABNORMAL LOW (ref 79–97)
Monocytes Absolute: 1 10*3/uL — ABNORMAL HIGH (ref 0.1–0.9)
Monocytes: 9 %
Neutrophils Absolute: 7.1 10*3/uL — ABNORMAL HIGH (ref 1.4–7.0)
Neutrophils: 67 %
Platelets: 391 10*3/uL (ref 150–450)
RBC: 3.91 x10E6/uL (ref 3.77–5.28)
RDW: 18.1 % — ABNORMAL HIGH (ref 11.7–15.4)
WBC: 10.5 10*3/uL (ref 3.4–10.8)

## 2020-07-21 NOTE — Telephone Encounter (Signed)
Reached out to patient with regards to her hemoglobin which was noted to be 7.5 on Friday.  Currently Ronnald Collum had already talked to her about this result on Saturday.  She does report that she had a presyncopal episode last Tuesday which precipitated the appointment last Friday.  She has not seen any hematochezia, melena.  She denies any abnormal vaginal bleeding, hematuria or epistaxis.  She does not really take any NSAIDs or aspirins.  No real change in diet.  Symptoms of fatigue have been ongoing for about 3 to 4 months.  We will see if we can tack on vitamin B12, folate and iron to today's labs which have already been collected.  We discussed likelihood of referral to GI.

## 2020-07-22 ENCOUNTER — Telehealth: Payer: Self-pay

## 2020-07-22 NOTE — Addendum Note (Signed)
Addended by: Chevis Pretty on: 07/22/2020 08:09 AM   Modules accepted: Orders

## 2020-07-22 NOTE — Telephone Encounter (Signed)
Patient aware of lab results and verbalizes understanding.  

## 2020-07-28 ENCOUNTER — Telehealth: Payer: Self-pay

## 2020-07-29 NOTE — Telephone Encounter (Signed)
Yes needs hgb repeated

## 2020-07-29 NOTE — Telephone Encounter (Signed)
Appt made for hemoglobin recheck for Nov 11

## 2020-07-29 NOTE — Telephone Encounter (Signed)
Patient aware and states she has already spoke with another nurse and appointment has been made for thursday

## 2020-07-31 ENCOUNTER — Other Ambulatory Visit: Payer: Self-pay

## 2020-07-31 ENCOUNTER — Ambulatory Visit (INDEPENDENT_AMBULATORY_CARE_PROVIDER_SITE_OTHER): Payer: Medicare Other | Admitting: Nurse Practitioner

## 2020-07-31 ENCOUNTER — Encounter: Payer: Self-pay | Admitting: Nurse Practitioner

## 2020-07-31 VITALS — BP 139/69 | HR 79 | Temp 96.9°F | Resp 20 | Ht 62.0 in | Wt 134.0 lb

## 2020-07-31 DIAGNOSIS — D500 Iron deficiency anemia secondary to blood loss (chronic): Secondary | ICD-10-CM | POA: Diagnosis not present

## 2020-07-31 DIAGNOSIS — F321 Major depressive disorder, single episode, moderate: Secondary | ICD-10-CM

## 2020-07-31 LAB — HEMOGLOBIN, FINGERSTICK: Hemoglobin: 8.3 g/dL — ABNORMAL LOW (ref 11.1–15.9)

## 2020-07-31 NOTE — Progress Notes (Signed)
Subjective:    Patient ID: [name] Page, female    DOB: 02-13-1951, 69 y.o.   MRN: [id number]   Chief Complaint: Anemia   HPI Patient was seen on 07/18/20 with fatigue and depression. We started her on lexapro10mg  daily. She says she is doing better. She denies any current medication side effects. Depression screen [facility] 2/9 07/31/2020 07/18/2020 10/10/2019  Decreased Interest 2 3 1   Down, Depressed, Hopeless 2 2 1   PHQ - 2 Score 4 5 2   Altered sleeping 0 2 0  Tired, decreased energy 2 3 0  Change in appetite 0 0 0  Feeling bad or failure about yourself  0 2 0  Trouble concentrating 1 2 0  Moving slowly or fidgety/restless 0 1 0  Suicidal thoughts 0 0 0  PHQ-9 Score 7 15 2   Difficult doing work/chores Somewhat difficult Very difficult Not difficult at all     Her hgb at last visit was 75mg . We started her on iron supplements and did GI referral and she has appointment first part of December. Says he fatigue has improved some.     Review of Systems  Constitutional: Positive for fatigue (improving). Negative for diaphoresis.  Eyes: Negative for pain.  Respiratory: Negative for shortness of breath.   Cardiovascular: Negative for chest pain, palpitations and leg swelling.  Gastrointestinal: Negative for abdominal pain.  Endocrine: Negative for polydipsia.  Skin: Negative for rash.  Neurological: Negative for dizziness, weakness and headaches.  Hematological: Does not bruise/bleed easily.  All other systems reviewed and are negative.      Objective:   Physical Exam Vitals and nursing note reviewed.  Constitutional:      General: She is not in acute distress.    Appearance: Normal appearance. She is well-developed.  HENT:     Head: Normocephalic.     Nose: Nose normal.  Eyes:     Pupils: Pupils are equal, round, and reactive to light.  Neck:     Vascular: No carotid bruit or JVD.  Cardiovascular:     Rate and Rhythm: Normal rate and regular rhythm.     Heart  sounds: Normal heart sounds.  Pulmonary:     Effort: Pulmonary effort is normal. No respiratory distress.     Breath sounds: Normal breath sounds. No wheezing or rales.  Chest:     Chest wall: No tenderness.  Abdominal:     General: Bowel sounds are normal. There is no distension or abdominal bruit.     Palpations: Abdomen is soft. There is no hepatomegaly, splenomegaly, mass or pulsatile mass.     Tenderness: There is no abdominal tenderness.  Musculoskeletal:        General: Normal range of motion.     Cervical back: Normal range of motion and neck supple.  Lymphadenopathy:     Cervical: No cervical adenopathy.  Skin:    General: Skin is warm and dry.  Neurological:     Mental Status: She is alert and oriented to person, place, and time.     Deep Tendon Reflexes: Reflexes are normal and symmetric.  Psychiatric:        Behavior: Behavior normal.        Thought Content: Thought content normal.        Judgment: Judgment normal.     Comments: jittery    BP 139/69   Pulse 79   Temp (!) 96.9 F (36.1 C) (Temporal)   Resp 20   Ht 5\' 2"  (1.575 m)  Wt 134 lb (60.8 kg)   SpO2 98%   BMI 24.51 kg/m         Assessment & Plan:  [name] Page in today with chief complaint of Anemia   1. Depression, major, single episode, moderate (HCC) Continue lexapro daily as rx Stress management  2. Iron deficiency anemia due to chronic blood loss continue iron supplements and foods rich in IRon Keep appointment with GI Follow up in 2 weeks    The above assessment and management plan was discussed with the patient. The patient verbalized understanding of and has agreed to the management plan. Patient is aware to call the clinic if symptoms persist or worsen. Patient is aware when to return to the clinic for a follow-up visit. Patient educated on when it is appropriate to go to the emergency department.   Mary-Margaret Hassell Done, FNP

## 2020-07-31 NOTE — Patient Instructions (Signed)
Anemia  Anemia is a condition in which you do not have enough red blood cells or hemoglobin. Hemoglobin is a substance in red blood cells that carries oxygen. When you do not have enough red blood cells or hemoglobin (are anemic), your body cannot get enough oxygen and your organs may not work properly. As a result, you may feel very tired or have other problems. What are the causes? Common causes of anemia include:  Excessive bleeding. Anemia can be caused by excessive bleeding inside or outside the body, including bleeding from the intestine or from periods in women.  Poor nutrition.  Long-lasting (chronic) kidney, thyroid, and liver disease.  Bone marrow disorders.  Cancer and treatments for cancer.  HIV (human immunodeficiency virus) and AIDS (acquired immunodeficiency syndrome).  Treatments for HIV and AIDS.  Spleen problems.  Blood disorders.  Infections, medicines, and autoimmune disorders that destroy red blood cells. What are the signs or symptoms? Symptoms of this condition include:  Minor weakness.  Dizziness.  Headache.  Feeling heartbeats that are irregular or faster than normal (palpitations).  Shortness of breath, especially with exercise.  Paleness.  Cold sensitivity.  Indigestion.  Nausea.  Difficulty sleeping.  Difficulty concentrating. Symptoms may occur suddenly or develop slowly. If your anemia is mild, you may not have symptoms. How is this diagnosed? This condition is diagnosed based on:  Blood tests.  Your medical history.  A physical exam.  Bone marrow biopsy. Your health care provider may also check your stool (feces) for blood and may do additional testing to look for the cause of your bleeding. You may also have other tests, including:  Imaging tests, such as a CT scan or MRI.  Endoscopy.  Colonoscopy. How is this treated? Treatment for this condition depends on the cause. If you continue to lose a lot of blood, you may  need to be treated at a hospital. Treatment may include:  Taking supplements of iron, vitamin M08, or folic acid.  Taking a hormone medicine (erythropoietin) that can help to stimulate red blood cell growth.  Having a blood transfusion. This may be needed if you lose a lot of blood.  Making changes to your diet.  Having surgery to remove your spleen. Follow these instructions at home:  Take over-the-counter and prescription medicines only as told by your health care provider.  Take supplements only as told by your health care provider.  Follow any diet instructions that you were given.  Keep all follow-up visits as told by your health care provider. This is important. Contact a health care provider if:  You develop new bleeding anywhere in the body. Get help right away if:  You are very weak.  You are short of breath.  You have pain in your abdomen or chest.  You are dizzy or feel faint.  You have trouble concentrating.  You have bloody or black, tarry stools.  You vomit repeatedly or you vomit up blood. Summary  Anemia is a condition in which you do not have enough red blood cells or enough of a substance in your red blood cells that carries oxygen (hemoglobin).  Symptoms may occur suddenly or develop slowly.  If your anemia is mild, you may not have symptoms.  This condition is diagnosed with blood tests as well as a medical history and physical exam. Other tests may be needed.  Treatment for this condition depends on the cause of the anemia. This information is not intended to replace advice given to you by  your health care provider. Make sure you discuss any questions you have with your health care provider. Document Revised: 08/19/2017 Document Reviewed: 10/08/2016 Elsevier Patient Education  [place].

## 2020-07-31 NOTE — Addendum Note (Signed)
Addended by: Rolena Infante on: 07/31/2020 04:11 PM   Modules accepted: Orders

## 2020-08-09 ENCOUNTER — Other Ambulatory Visit: Payer: Self-pay | Admitting: Nurse Practitioner

## 2020-08-12 ENCOUNTER — Other Ambulatory Visit: Payer: Self-pay

## 2020-08-12 ENCOUNTER — Encounter: Payer: Self-pay | Admitting: Nurse Practitioner

## 2020-08-12 ENCOUNTER — Ambulatory Visit (INDEPENDENT_AMBULATORY_CARE_PROVIDER_SITE_OTHER): Payer: Medicare Other | Admitting: Nurse Practitioner

## 2020-08-12 VITALS — BP 144/70 | HR 75 | Temp 97.2°F | Resp 20 | Ht 62.0 in | Wt 136.0 lb

## 2020-08-12 DIAGNOSIS — D500 Iron deficiency anemia secondary to blood loss (chronic): Secondary | ICD-10-CM | POA: Diagnosis not present

## 2020-08-12 DIAGNOSIS — K922 Gastrointestinal hemorrhage, unspecified: Secondary | ICD-10-CM

## 2020-08-12 LAB — HEMOGLOBIN, FINGERSTICK: Hemoglobin: 8.9 g/dL — ABNORMAL LOW (ref 11.1–15.9)

## 2020-08-12 NOTE — Progress Notes (Signed)
Subjective:    Patient ID: [name] Page, female    DOB: [birth year], 69 y.o.   MRN: [id number]   Chief Complaint: Depression   HPI Increased weakness x 2 months, low hgb history, todays hemoglobin is 8.9 which is increased from last visit. Is taking Lexapro as prescribed and states her depression is better with no complaints. Has GI follow up on Dec 30th, normal bowel movements per pt and states she does not notice any blood in stool.    Office Visit from 08/12/2020 in [place]  PHQ-9 Total Score 10        Review of Systems  Constitutional: Negative.   HENT: Negative.   Eyes: Negative.   Respiratory: Negative.   Cardiovascular: Negative.   Gastrointestinal: Negative.   Endocrine: Negative.   Genitourinary: Negative.   Musculoskeletal: Negative.   Allergic/Immunologic: Negative.   Neurological: Negative.   Hematological: Negative.   Psychiatric/Behavioral: Negative.   All other systems reviewed and are negative.      Objective:   Physical Exam Vitals and nursing note reviewed.  Constitutional:      Appearance: Normal appearance.  HENT:     Head: Normocephalic and atraumatic.     Right Ear: External ear normal.     Left Ear: External ear normal.     Nose: Nose normal.     Mouth/Throat:     Mouth: Mucous membranes are moist.     Pharynx: Oropharynx is clear.  Eyes:     Pupils: Pupils are equal, round, and reactive to light.  Cardiovascular:     Rate and Rhythm: Normal rate and regular rhythm.     Pulses: Normal pulses.     Heart sounds: Normal heart sounds.  Pulmonary:     Effort: Pulmonary effort is normal.     Breath sounds: Normal breath sounds.  Abdominal:     General: Abdomen is flat.     Palpations: Abdomen is soft.  Musculoskeletal:        General: Normal range of motion.     Cervical back: Normal range of motion.  Skin:    General: Skin is warm and dry.     Capillary Refill: Capillary refill takes less than 2 seconds.    Neurological:     General: No focal deficit present.     Mental Status: She is alert and oriented to person, place, and time. Mental status is at baseline.  Psychiatric:        Mood and Affect: Mood normal.        Behavior: Behavior normal.        Thought Content: Thought content normal.        Judgment: Judgment normal.    BP (!) 144/70   Pulse 75   Temp (!) 97.2 F (36.2 C) (Temporal)   Resp 20   Ht 5\' 2"  (1.575 m)   Wt 136 lb (61.7 kg)   SpO2 97%   BMI 24.87 kg/m   hgb 8.9%    Assessment & Plan:  [name] Page in today with chief complaint of Depression   1. Iron deficiency anemia due to chronic blood loss - Hemoglobin, fingerstick  2. Gastrointestinal hemorrhage, unspecified gastrointestinal hemorrhage type Stat referral to GI Continue iro and iron rich foods - Ambulatory referral to Gastroenterology    The above assessment and management plan was discussed with the patient. The patient verbalized understanding of and has agreed to the management plan. Patient is aware to call the  clinic if symptoms persist or worsen. Patient is aware when to return to the clinic for a follow-up visit. Patient educated on when it is appropriate to go to the emergency department.   Mary-Margaret Hassell Done, FNP

## 2020-08-12 NOTE — Patient Instructions (Addendum)

## 2020-08-18 ENCOUNTER — Ambulatory Visit: Payer: Self-pay | Admitting: Nurse Practitioner

## 2020-08-20 ENCOUNTER — Ambulatory Visit (INDEPENDENT_AMBULATORY_CARE_PROVIDER_SITE_OTHER): Payer: Medicare Other | Admitting: Physician Assistant

## 2020-08-20 ENCOUNTER — Encounter: Payer: Self-pay | Admitting: Physician Assistant

## 2020-08-20 ENCOUNTER — Other Ambulatory Visit (INDEPENDENT_AMBULATORY_CARE_PROVIDER_SITE_OTHER): Payer: Medicare Other

## 2020-08-20 VITALS — BP 164/88 | HR 81 | Ht 62.0 in | Wt 134.0 lb

## 2020-08-20 DIAGNOSIS — R531 Weakness: Secondary | ICD-10-CM | POA: Diagnosis not present

## 2020-08-20 DIAGNOSIS — D509 Iron deficiency anemia, unspecified: Secondary | ICD-10-CM

## 2020-08-20 DIAGNOSIS — R55 Syncope and collapse: Secondary | ICD-10-CM

## 2020-08-20 LAB — IBC PANEL
Iron: 28 ug/dL — ABNORMAL LOW (ref 42–145)
Saturation Ratios: 8.5 % — ABNORMAL LOW (ref 20.0–50.0)
Transferrin: 236 mg/dL (ref 212.0–360.0)

## 2020-08-20 LAB — HEMOGLOBIN: Hemoglobin: 9.8 g/dL — ABNORMAL LOW (ref 12.0–15.0)

## 2020-08-20 LAB — FERRITIN: Ferritin: 10.1 ng/mL (ref 10.0–291.0)

## 2020-08-20 MED ORDER — NA SULFATE-K SULFATE-MG SULF 17.5-3.13-1.6 GM/177ML PO SOLN: 1.0000 | Freq: Once | ORAL | 0 refills | Status: AC

## 2020-08-20 NOTE — Progress Notes (Signed)
Chief Complaint: Iron deficiency anemia  HPI:    [name] Page is a 69 year old female with a past medical history as listed below, who was referred to me by Janora Norlander, DO for a complaint of iron deficiency anemia.      07/21/2020 CBC with a hemoglobin of 7.6 (7.5 on 10/29, 12.7 on 10/10/2019).    07/31/2020 hemoglobin 8.3    08/12/2020 hemoglobin 8.9    Today, the patient presents to clinic accompanied by her daughter who does assist with history.  They explain that the patient has been anemic before in her life but they always blamed this on menstrual bleeding, apparently her hemoglobin got down to 5 and she required blood transfusions, though this was years ago.  Most recently over the past 2 months she has had 2 episodes of syncope and is very weak and tired which is "unlike me".  Initially the EMS was called and she was found to have a hemoglobin of 7.5, it has been checked subsequently as above and is improving on oral iron supplementation.    Patient has never had a colonoscopy or endoscopy.    Denies fever, chills, weight loss, change in bowel habits, abdominal pain, heartburn, reflux, nausea, vomiting or seeing blood in her stool.  Past Medical History:  Diagnosis Date  . Arthritis   . Basal cell carcinoma   . 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 Cancer ([place])    melanoma  . Dysuria   . Frequency of urination   . Hematuria   . History of kidney stones   . Osteoporosis   . Renal calculus, bilateral   . 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 Urgency of urination   . Wears glasses     Past Surgical History:  Procedure Laterality Date  . CYSTOSCOPY WITH RETROGRADE PYELOGRAM, URETEROSCOPY AND STENT PLACEMENT Bilateral 08/11/2013   Procedure: CYSTOSCOPY WITH RETROGRADE PYELOGRAM, URETEROSCOPY AND STENT PLACEMENT;  Surgeon: Alexis Frock, MD;  Location: [facility];  Service: Urology;  Laterality: Bilateral;  . CYSTOSCOPY WITH  URETEROSCOPY AND STENT PLACEMENT Bilateral 08/29/2013   Procedure: CYSTOSCOPY WITH BILATERAL URETEROSCOPY AND STENT REPLACEMENTS, stone extraction;  Surgeon: Alexis Frock, MD;  Location: [facility];  Service: Urology;  Laterality: Bilateral;  . CYSTOSCOPY/RETROGRADE/URETEROSCOPY/STONE EXTRACTION WITH BASKET Bilateral 01/31/2016   Procedure: CYSTOSCOPY/BILATERAL RETROGRADE PYELOGRAMS/BILATERAL URETEROSCOPY/BILATERAL STONE EXTRACTION WITH BASKET/RIGHT URETERAL STENT PLACEMENT;  Surgeon: Raynelle Bring, MD;  Location: [facility];  Service: Urology;  Laterality: Bilateral;  . HEMIARTHROPLASTY SHOULDER FRACTURE Left 1992  . HOLMIUM LASER APPLICATION Bilateral [id number]   Procedure: HOLMIUM LASER APPLICATION;  Surgeon: Alexis Frock, MD;  Location: [facility];  Service: Urology;  Laterality: Bilateral;    Current Outpatient Medications  Medication Sig Dispense Refill  . alendronate (FOSAMAX) 70 MG tablet TAKE 1 TABLET (70 MG TOTAL) BY MOUTH EVERY 7 (SEVEN) DAYS. REPORTED ON 10/30/2015 12 tablet 1  . atorvastatin (LIPITOR) 20 MG tablet Take 1 tablet (20 mg total) by mouth daily. 30 tablet 0  . Calcium Carbonate-Vit D-Min (CALCIUM 1200 PO) Take 1 tablet by mouth daily.    Marland Kitchen escitalopram (LEXAPRO) 10 MG tablet TAKE 1 TABLET BY MOUTH EVERY DAY 90 tablet 1  . ferrous sulfate 325 (65 FE) MG tablet Take 325 mg by mouth daily with breakfast.    . Glycerin-Hypromellose-PEG 400 ([facility] DRY EYE RELIEF) 0.2-0.2-1 % SOLN Apply 1 drop to eye 2 (two) times daily  as needed (dry eyes).     Marland Kitchen ibuprofen (ADVIL,MOTRIN) 200 MG tablet Take 200-400 mg by mouth every 6 (six) hours as needed for moderate pain.    Marland Kitchen loratadine (CLARITIN) 10 MG tablet Take 10 mg by mouth daily as needed for allergies.    . Multiple Vitamin (MULTIVITAMIN WITH MINERALS) TABS tablet Take 1 tablet by mouth daily.     No current facility-administered medications for this visit.    Allergies as of 08/20/2020 - Review  Complete 08/20/2020  Allergen Reaction Noted  . Sulfa antibiotics Rash 03/19/2013    Family History  Problem Relation Age of Onset  . Cancer Mother   . Atrial fibrillation Father   . Hyperlipidemia Father   . Hypertension Father   . Hypertension Sister   . Hyperlipidemia Sister   . Thyroid disease Sister     Social History   Socioeconomic History  . Marital status: Legally Separated    Spouse name: Not on file  . Number of children: 1  . Years of education: 41  . Highest education level: Not on file  Occupational History  . Not on file  Tobacco Use  . Smoking status: Never Smoker  . Smokeless tobacco: Never Used  Vaping Use  . Vaping Use: Never used  Substance and Sexual Activity  . Alcohol use: No  . Drug use: No  . Sexual activity: Not on file  Other Topics Concern  . Not on file  Social History Narrative   Patient resides locally.  She is currently separated from her spouse.  She is living in a mobile home near her daughter and 2 grandchildren and her father.   Social Determinants of Health   Financial Resource Strain:   . Difficulty of Paying Living Expenses: Not on file  Food Insecurity: No Food Insecurity  . Worried About Charity fundraiser in the Last Year: Never true  . Ran Out of Food in the Last Year: Never true  Transportation Needs:   . Lack of Transportation (Medical): Not on file  . Lack of Transportation (Non-Medical): Not on file  Physical Activity: Insufficiently Active  . Days of Exercise per Week: 3 days  . Minutes of Exercise per Session: 30 min  Stress: No Stress Concern Present  . Feeling of Stress : Only a little  Social Connections: Moderately Isolated  . Frequency of Communication with Friends and Family: More than three times a week  . Frequency of Social Gatherings with Friends and Family: Twice a week  . Attends Religious Services: 1 to 4 times per year  . Active Member of Clubs or Organizations: No  . Attends Theatre manager Meetings: Never  . Marital Status: Separated  Intimate Partner Violence: Not At Risk  . Fear of Current or Ex-Partner: No  . Emotionally Abused: No  . Physically Abused: No  . Sexually Abused: No    Review of Systems:    Constitutional: No weight loss, fever or chills Skin: No rash  Cardiovascular: No chest pain Respiratory: No SOB Gastrointestinal: See HPI and otherwise negative Genitourinary: No dysuria  Neurological: No headache Musculoskeletal: No new muscle or joint pain Hematologic: No bruising Psychiatric: No history of depression or anxiety   Physical Exam:  Vital signs: BP (!) 164/88   Pulse 81   Ht 5\' 2"  (1.575 m)   Wt 134 lb (60.8 kg)   BMI 24.51 kg/m   Constitutional:   Pleasant Elderly Caucasian female appears to be in NAD,  Well developed, Well nourished, alert and cooperative Head:  Normocephalic and atraumatic. Eyes:   PEERL, EOMI. No icterus. Conjunctiva pink. Ears:  Normal auditory acuity. Neck:  Supple Throat: Oral cavity and pharynx without inflammation, swelling or lesion.  Respiratory: Respirations even and unlabored. Lungs clear to auscultation bilaterally.   No wheezes, crackles, or rhonchi.  Cardiovascular: Normal S1, S2. No MRG. Regular rate and rhythm. No peripheral edema, cyanosis or pallor.  Gastrointestinal:  Soft, nondistended, nontender. No rebound or guarding. Normal bowel sounds. No appreciable masses or hepatomegaly. Rectal:  Not performed.  Msk:  Symmetrical without gross deformities. Without edema, no deformity or joint abnormality.  Neurologic:  Alert and  oriented x4;  grossly normal neurologically.  Skin:   Dry and intact without significant lesions or rashes. Psychiatric: Demonstrates good judgement and reason without abnormal affect or behaviors.  RELEVANT LABS AND IMAGING: CBC    Component Value Date/Time   WBC 10.5 07/21/2020 1100   WBC 10.3 01/27/2019 2329   RBC 3.91 07/21/2020 1100   RBC 4.80 01/27/2019 2329    HGB 7.6 (LL) 07/21/2020 1100   HCT 27.6 (L) 07/21/2020 1100   PLT 391 07/21/2020 1100   MCV 71 (L) 07/21/2020 1100   MCH 19.4 (L) 07/21/2020 1100   MCH 26.0 01/27/2019 2329   MCHC 27.5 (L) 07/21/2020 1100   MCHC 29.8 (L) 01/27/2019 2329   RDW 18.1 (H) 07/21/2020 1100   LYMPHSABS 2.2 07/21/2020 1100   MONOABS 0.3 01/27/2019 2329   EOSABS 0.2 07/21/2020 1100   BASOSABS 0.1 07/21/2020 1100    CMP     Component Value Date/Time   NA 143 07/18/2020 1044   K 4.9 07/18/2020 1044   CL 107 (H) 07/18/2020 1044   CO2 20 07/18/2020 1044   GLUCOSE 105 (H) 07/18/2020 1044   GLUCOSE 166 (H) 01/27/2019 2329   BUN 21 07/18/2020 1044   CREATININE 0.96 07/18/2020 1044   CALCIUM 9.3 07/18/2020 1044   PROT 7.9 07/18/2020 1044   ALBUMIN 4.1 07/18/2020 1044   AST 17 07/18/2020 1044   ALT 10 07/18/2020 1044   ALKPHOS 155 (H) 07/18/2020 1044   BILITOT 0.4 07/18/2020 1044   GFRNONAA 60 07/18/2020 1044   GFRAA 69 07/18/2020 1044    Assessment: 1.  Iron deficiency anemia: Hemoglobin 8.9 after oral iron supplementation; consider GI source of blood loss versus other  Plan: 1.  Scheduled patient for diagnostic EGD and colonoscopy in the [place] with Dr. Carlean Purl, as he has sooner availability, he will be her primary GI physician going forward.  Did discuss risks, benefits, limitations and alternatives and the patient agrees to proceed.  She has had her Covid vaccines. 2.  Continue oral iron supplementation. 3.  We will recheck hemoglobin and iron studies today including ferritin.  Recommend she continue follow-up with her PCP in regards to rechecking her hemoglobin until time procedure. 4.  Patient to follow in clinic per recommendations from Dr. Carlean Purl after time of procedures.  Ellouise Newer, PA-C [facility] 08/20/2020, 1:38 PM  Cc: Janora Norlander, DO

## 2020-08-20 NOTE — Patient Instructions (Addendum)
If you are age 70 or older, your body mass index should be between 23-30. Your Body mass index is 24.51 kg/m. If this is out of the aforementioned range listed, please consider follow up with your Primary Care Provider.  If you are age 30 or younger, your body mass index should be between 19-25. Your Body mass index is 24.51 kg/m. If this is out of the aformentioned range listed, please consider follow up with your Primary Care Provider.   You have been scheduled for an endoscopy and colonoscopy. Please follow the written instructions given to you at your visit today. Please pick up your prep supplies at the pharmacy within the next 1-3 days. If you use inhalers (even only as needed), please bring them with you on the day of your procedure.  Your provider has requested that you go to the basement level for lab work before leaving today. Press "B" on the elevator. The lab is located at the first door on the left as you exit the elevator.  Please Continue Iron

## 2020-08-22 ENCOUNTER — Other Ambulatory Visit: Payer: Self-pay | Admitting: Family Medicine

## 2020-08-22 DIAGNOSIS — E782 Mixed hyperlipidemia: Secondary | ICD-10-CM

## 2020-08-27 NOTE — Progress Notes (Signed)
Reviewed and agree with documentation and assessment and plan. K. Veena Melvia Matousek , MD   

## 2020-09-02 ENCOUNTER — Telehealth: Payer: Self-pay | Admitting: Physician Assistant

## 2020-09-02 NOTE — Telephone Encounter (Signed)
Patient is requesting to speak with a nurse regarding the prep medication and the directions given

## 2020-09-02 NOTE — Telephone Encounter (Signed)
Called patient to let her know to proceed with Miralax split dose instuctions

## 2020-09-04 ENCOUNTER — Encounter (HOSPITAL_COMMUNITY): Payer: Self-pay | Admitting: Emergency Medicine

## 2020-09-04 ENCOUNTER — Inpatient Hospital Stay (HOSPITAL_COMMUNITY): Payer: Medicare Other

## 2020-09-04 ENCOUNTER — Telehealth: Payer: Self-pay | Admitting: Internal Medicine

## 2020-09-04 ENCOUNTER — Other Ambulatory Visit: Payer: Self-pay

## 2020-09-04 ENCOUNTER — Inpatient Hospital Stay (HOSPITAL_COMMUNITY)
Admission: EM | Admit: 2020-09-04 | Discharge: 2020-09-17 | DRG: 025 | Disposition: A | Discharge status: Rehabilitation Facility | Payer: Medicare Other | Attending: Internal Medicine | Admitting: Internal Medicine

## 2020-09-04 ENCOUNTER — Emergency Department (HOSPITAL_COMMUNITY): Payer: Medicare Other

## 2020-09-04 DIAGNOSIS — D492 Neoplasm of unspecified behavior of bone, soft tissue, and skin: Secondary | ICD-10-CM | POA: Diagnosis present

## 2020-09-04 DIAGNOSIS — F32A Depression, unspecified: Secondary | ICD-10-CM | POA: Diagnosis present

## 2020-09-04 DIAGNOSIS — N183 Chronic kidney disease, stage 3 unspecified: Secondary | ICD-10-CM | POA: Diagnosis present

## 2020-09-04 DIAGNOSIS — Z298 Encounter for other specified prophylactic measures: Secondary | ICD-10-CM

## 2020-09-04 DIAGNOSIS — R4182 Altered mental status, unspecified: Secondary | ICD-10-CM | POA: Diagnosis not present

## 2020-09-04 DIAGNOSIS — R609 Edema, unspecified: Secondary | ICD-10-CM | POA: Diagnosis not present

## 2020-09-04 DIAGNOSIS — K8020 Calculus of gallbladder without cholecystitis without obstruction: Secondary | ICD-10-CM | POA: Diagnosis not present

## 2020-09-04 DIAGNOSIS — I129 Hypertensive chronic kidney disease with stage 1 through stage 4 chronic kidney disease, or unspecified chronic kidney disease: Secondary | ICD-10-CM | POA: Diagnosis present

## 2020-09-04 DIAGNOSIS — K5900 Constipation, unspecified: Secondary | ICD-10-CM | POA: Diagnosis not present

## 2020-09-04 DIAGNOSIS — R9431 Abnormal electrocardiogram [ECG] [EKG]: Secondary | ICD-10-CM | POA: Diagnosis not present

## 2020-09-04 DIAGNOSIS — C4492 Squamous cell carcinoma of skin, unspecified: Secondary | ICD-10-CM

## 2020-09-04 DIAGNOSIS — N3001 Acute cystitis with hematuria: Secondary | ICD-10-CM

## 2020-09-04 DIAGNOSIS — R222 Localized swelling, mass and lump, trunk: Secondary | ICD-10-CM

## 2020-09-04 DIAGNOSIS — M810 Age-related osteoporosis without current pathological fracture: Secondary | ICD-10-CM | POA: Diagnosis not present

## 2020-09-04 DIAGNOSIS — Z7983 Long term (current) use of bisphosphonates: Secondary | ICD-10-CM | POA: Diagnosis not present

## 2020-09-04 DIAGNOSIS — D329 Benign neoplasm of meninges, unspecified: Secondary | ICD-10-CM

## 2020-09-04 DIAGNOSIS — Z85820 Personal history of malignant melanoma of skin: Secondary | ICD-10-CM | POA: Diagnosis not present

## 2020-09-04 DIAGNOSIS — R7401 Elevation of levels of liver transaminase levels: Secondary | ICD-10-CM | POA: Diagnosis not present

## 2020-09-04 DIAGNOSIS — D320 Benign neoplasm of cerebral meninges: Principal | ICD-10-CM | POA: Diagnosis present

## 2020-09-04 DIAGNOSIS — Z8371 Family history of colonic polyps: Secondary | ICD-10-CM | POA: Diagnosis not present

## 2020-09-04 DIAGNOSIS — R296 Repeated falls: Secondary | ICD-10-CM | POA: Diagnosis not present

## 2020-09-04 DIAGNOSIS — R531 Weakness: Secondary | ICD-10-CM | POA: Diagnosis not present

## 2020-09-04 DIAGNOSIS — G9340 Encephalopathy, unspecified: Secondary | ICD-10-CM | POA: Diagnosis present

## 2020-09-04 DIAGNOSIS — Z8249 Family history of ischemic heart disease and other diseases of the circulatory system: Secondary | ICD-10-CM | POA: Diagnosis not present

## 2020-09-04 DIAGNOSIS — N200 Calculus of kidney: Secondary | ICD-10-CM | POA: Diagnosis not present

## 2020-09-04 DIAGNOSIS — M1990 Unspecified osteoarthritis, unspecified site: Secondary | ICD-10-CM | POA: Diagnosis present

## 2020-09-04 DIAGNOSIS — C4441 Basal cell carcinoma of skin of scalp and neck: Secondary | ICD-10-CM | POA: Diagnosis present

## 2020-09-04 DIAGNOSIS — R519 Headache, unspecified: Secondary | ICD-10-CM | POA: Diagnosis not present

## 2020-09-04 DIAGNOSIS — R269 Unspecified abnormalities of gait and mobility: Secondary | ICD-10-CM | POA: Diagnosis not present

## 2020-09-04 DIAGNOSIS — R001 Bradycardia, unspecified: Secondary | ICD-10-CM | POA: Diagnosis present

## 2020-09-04 DIAGNOSIS — K922 Gastrointestinal hemorrhage, unspecified: Secondary | ICD-10-CM | POA: Diagnosis not present

## 2020-09-04 DIAGNOSIS — M6281 Muscle weakness (generalized): Secondary | ICD-10-CM | POA: Diagnosis not present

## 2020-09-04 DIAGNOSIS — N1831 Chronic kidney disease, stage 3a: Secondary | ICD-10-CM | POA: Diagnosis present

## 2020-09-04 DIAGNOSIS — G936 Cerebral edema: Secondary | ICD-10-CM | POA: Diagnosis present

## 2020-09-04 DIAGNOSIS — R251 Tremor, unspecified: Secondary | ICD-10-CM | POA: Diagnosis not present

## 2020-09-04 DIAGNOSIS — E785 Hyperlipidemia, unspecified: Secondary | ICD-10-CM | POA: Diagnosis present

## 2020-09-04 DIAGNOSIS — Z882 Allergy status to sulfonamides status: Secondary | ICD-10-CM

## 2020-09-04 DIAGNOSIS — G935 Compression of brain: Secondary | ICD-10-CM | POA: Diagnosis not present

## 2020-09-04 DIAGNOSIS — Z96612 Presence of left artificial shoulder joint: Secondary | ICD-10-CM | POA: Diagnosis present

## 2020-09-04 DIAGNOSIS — N179 Acute kidney failure, unspecified: Secondary | ICD-10-CM | POA: Diagnosis not present

## 2020-09-04 DIAGNOSIS — Z79899 Other long term (current) drug therapy: Secondary | ICD-10-CM | POA: Diagnosis not present

## 2020-09-04 DIAGNOSIS — Z049 Encounter for examination and observation for unspecified reason: Secondary | ICD-10-CM | POA: Diagnosis not present

## 2020-09-04 DIAGNOSIS — D509 Iron deficiency anemia, unspecified: Secondary | ICD-10-CM | POA: Diagnosis not present

## 2020-09-04 DIAGNOSIS — Z87442 Personal history of urinary calculi: Secondary | ICD-10-CM | POA: Diagnosis not present

## 2020-09-04 DIAGNOSIS — N390 Urinary tract infection, site not specified: Secondary | ICD-10-CM | POA: Diagnosis present

## 2020-09-04 DIAGNOSIS — I10 Essential (primary) hypertension: Secondary | ICD-10-CM | POA: Diagnosis not present

## 2020-09-04 DIAGNOSIS — R600 Localized edema: Secondary | ICD-10-CM | POA: Diagnosis present

## 2020-09-04 DIAGNOSIS — E46 Unspecified protein-calorie malnutrition: Secondary | ICD-10-CM | POA: Diagnosis not present

## 2020-09-04 DIAGNOSIS — H5789 Other specified disorders of eye and adnexa: Secondary | ICD-10-CM | POA: Diagnosis not present

## 2020-09-04 DIAGNOSIS — Z2989 Encounter for other specified prophylactic measures: Secondary | ICD-10-CM

## 2020-09-04 DIAGNOSIS — G9389 Other specified disorders of brain: Secondary | ICD-10-CM

## 2020-09-04 DIAGNOSIS — Z20822 Contact with and (suspected) exposure to covid-19: Secondary | ICD-10-CM | POA: Diagnosis present

## 2020-09-04 DIAGNOSIS — R319 Hematuria, unspecified: Secondary | ICD-10-CM | POA: Diagnosis present

## 2020-09-04 DIAGNOSIS — E8809 Other disorders of plasma-protein metabolism, not elsewhere classified: Secondary | ICD-10-CM | POA: Diagnosis not present

## 2020-09-04 HISTORY — DX: Benign neoplasm of meninges, unspecified: D32.9

## 2020-09-04 LAB — URINALYSIS, ROUTINE W REFLEX MICROSCOPIC
Bilirubin Urine: NEGATIVE
Glucose, UA: NEGATIVE mg/dL
Ketones, ur: 20 mg/dL — AB
Nitrite: NEGATIVE
Protein, ur: 100 mg/dL — AB
RBC / HPF: >50 RBC/hpf — ABNORMAL HIGH (ref 0–5)
Specific Gravity, Urine: 1.016 (ref 1.005–1.030)
WBC, UA: >50 WBC/hpf — ABNORMAL HIGH (ref 0–5)
pH: 6 (ref 5.0–8.0)

## 2020-09-04 LAB — COMPREHENSIVE METABOLIC PANEL
ALT: 14 U/L (ref 0–44)
AST: 21 U/L (ref 15–41)
Albumin: 3.5 g/dL (ref 3.5–5.0)
Alkaline Phosphatase: 121 U/L (ref 38–126)
Anion gap: 11 (ref 5–15)
BUN: 18 mg/dL (ref 8–23)
CO2: 25 mmol/L (ref 22–32)
Calcium: 8.7 mg/dL — ABNORMAL LOW (ref 8.9–10.3)
Chloride: 104 mmol/L (ref 98–111)
Creatinine, Ser: 1.24 mg/dL — ABNORMAL HIGH (ref 0.44–1.00)
GFR, Estimated: 47 mL/min — ABNORMAL LOW (ref >60)
Glucose, Bld: 127 mg/dL — ABNORMAL HIGH (ref 70–99)
Potassium: 3.8 mmol/L (ref 3.5–5.1)
Sodium: 140 mmol/L (ref 135–145)
Total Bilirubin: 0.7 mg/dL (ref 0.3–1.2)
Total Protein: 7.7 g/dL (ref 6.5–8.1)

## 2020-09-04 LAB — BASIC METABOLIC PANEL
Anion gap: 11 (ref 5–15)
BUN: 19 mg/dL (ref 8–23)
CO2: 25 mmol/L (ref 22–32)
Calcium: 8.9 mg/dL (ref 8.9–10.3)
Chloride: 104 mmol/L (ref 98–111)
Creatinine, Ser: 1.23 mg/dL — ABNORMAL HIGH (ref 0.44–1.00)
GFR, Estimated: 47 mL/min — ABNORMAL LOW (ref >60)
Glucose, Bld: 126 mg/dL — ABNORMAL HIGH (ref 70–99)
Potassium: 3.7 mmol/L (ref 3.5–5.1)
Sodium: 140 mmol/L (ref 135–145)

## 2020-09-04 LAB — CBC
HCT: 33.1 % — ABNORMAL LOW (ref 36.0–46.0)
Hemoglobin: 9.4 g/dL — ABNORMAL LOW (ref 12.0–15.0)
MCH: 23.6 pg — ABNORMAL LOW (ref 26.0–34.0)
MCHC: 28.4 g/dL — ABNORMAL LOW (ref 30.0–36.0)
MCV: 83 fL (ref 80.0–100.0)
Platelets: 224 10*3/uL (ref 150–400)
RBC: 3.99 MIL/uL (ref 3.87–5.11)
RDW: 23.7 % — ABNORMAL HIGH (ref 11.5–15.5)
WBC: 11.5 10*3/uL — ABNORMAL HIGH (ref 4.0–10.5)
nRBC: 0 % (ref 0.0–0.2)

## 2020-09-04 LAB — PROTIME-INR
INR: 1 (ref 0.8–1.2)
Prothrombin Time: 12.5 seconds (ref 11.4–15.2)

## 2020-09-04 LAB — RESP PANEL BY RT-PCR (FLU A&B, COVID) ARPGX2
Influenza A by PCR: NEGATIVE
Influenza B by PCR: NEGATIVE
SARS Coronavirus 2 by RT PCR: NEGATIVE

## 2020-09-04 IMAGING — DX DG CHEST 1V PORT
1 series · 1 of 1 positions shown · non-contrast
Comparison: None.

CLINICAL DATA: Headache weakness

EXAM:
PORTABLE CHEST 1 VIEW

[chest ap]
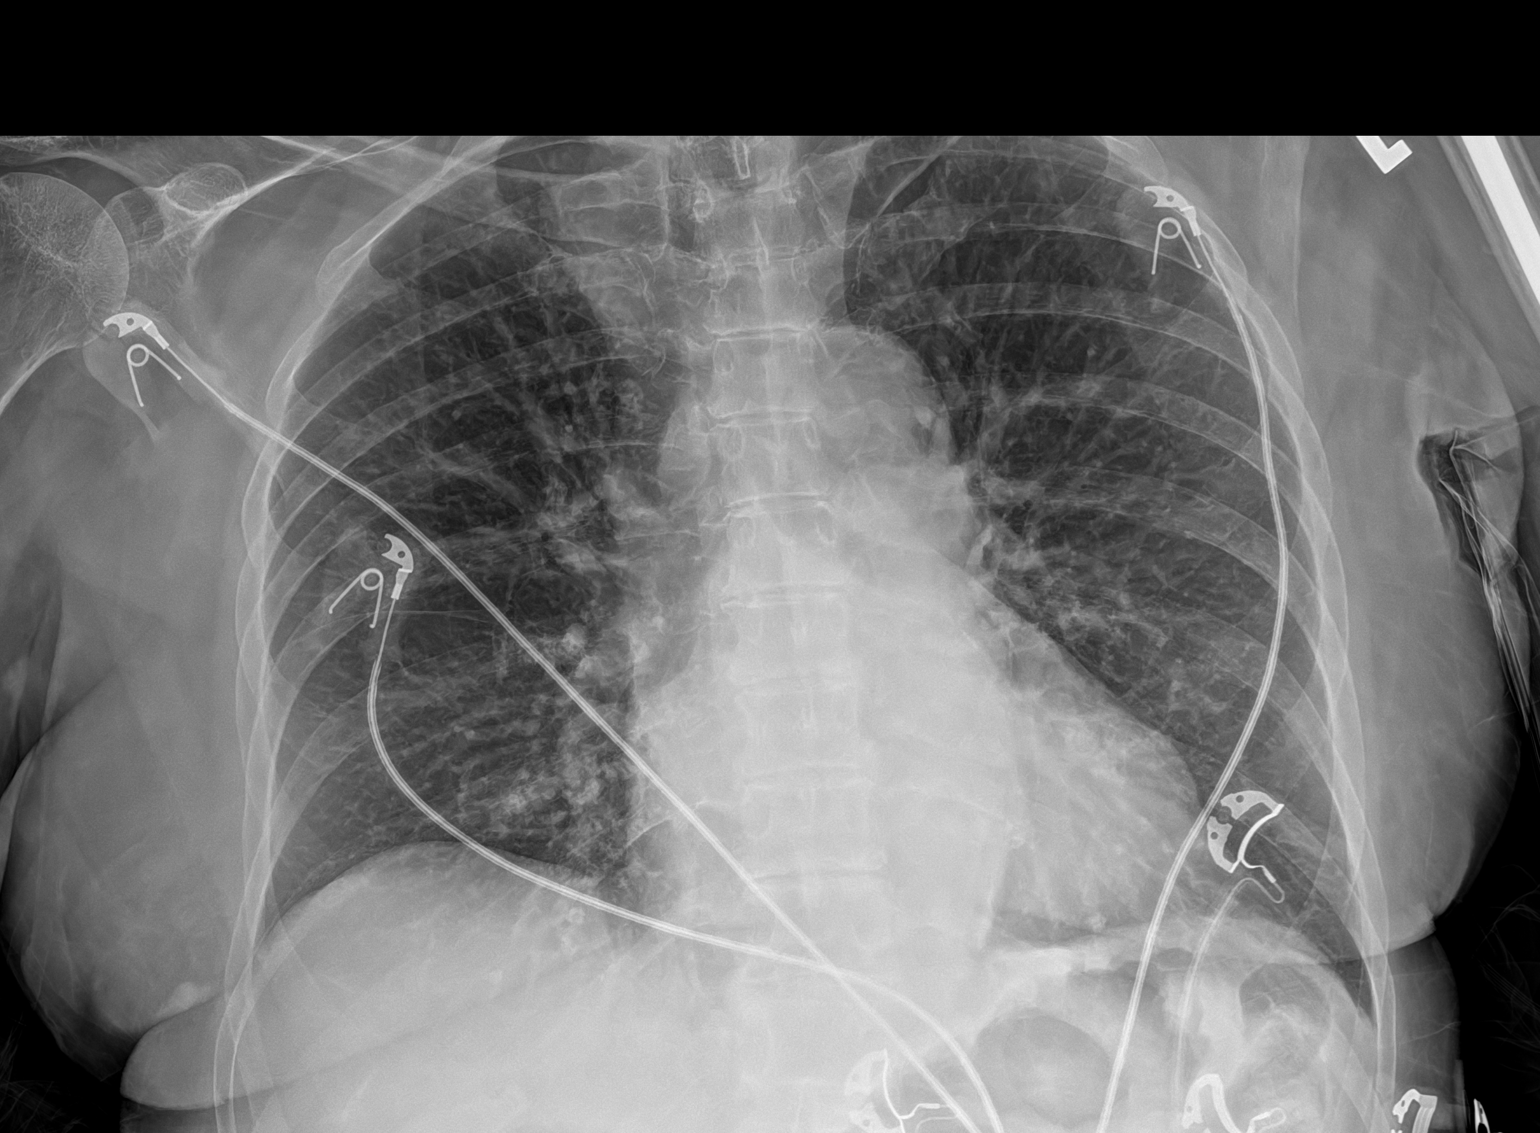

[1 of 1 positions shown; findings below may reference images not displayed]

FINDINGS: The heart size and mediastinal contours are within normal limits.
Both lungs are clear. Partially visualized intramedullary rod in the
left humerus. Mild diffuse bronchitic changes.
IMPRESSION: Mild bronchitic changes without focal pneumonia.

## 2020-09-04 IMAGING — CT CT HEAD WO/W CM
3 of 4 series · 16 of 47 positions shown, 19 images · IV contrast (omnipaque)
Comparison: None.

CLINICAL DATA: Encephalopathy

EXAM:
CT HEAD WITHOUT AND WITH CONTRAST
TECHNIQUE: Contiguous axial images were obtained from the base of the skull
through the vertex without and with intravenous contrast
CONTRAST:  75mL OMNIPAQUE IOHEXOL 300 MG/ML  SOLN

[Series 4: head w o · axial · 0.41mm/px · z∈[-67,+78]mm · 10 of 33 slices shown, 13 images]
[im 2/33  brain]
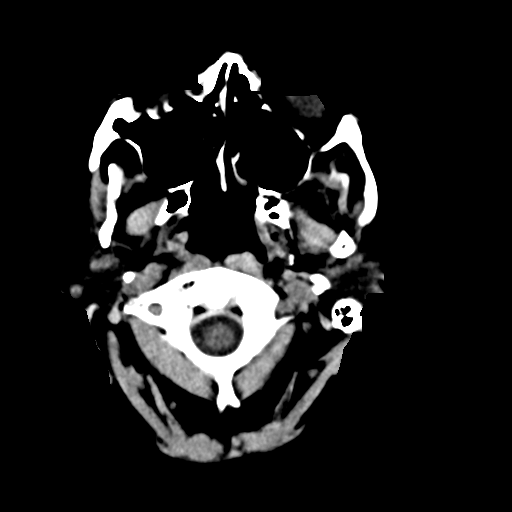
[im 2/33  bone]
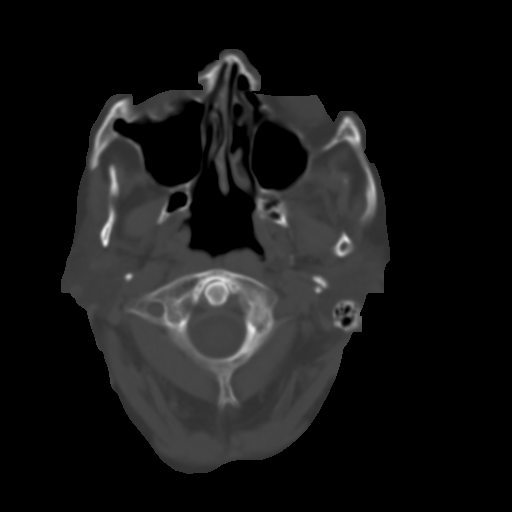
[im 6/33  brain]
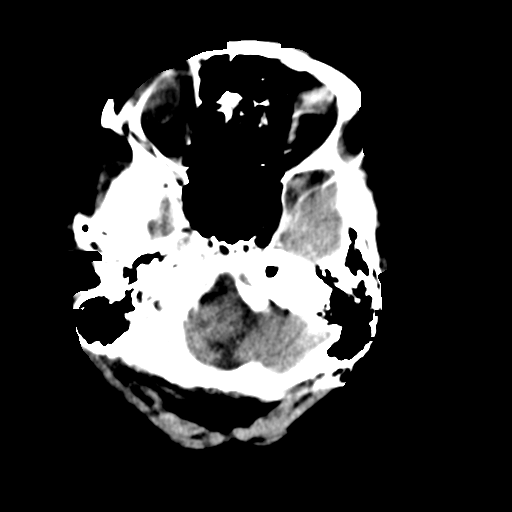
[im 9/33  brain]
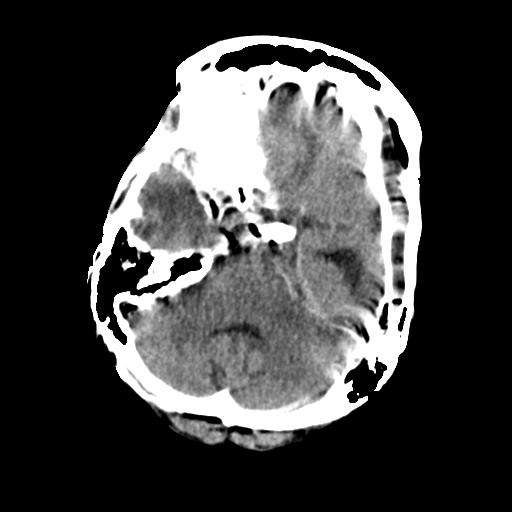
[im 12/33  brain]
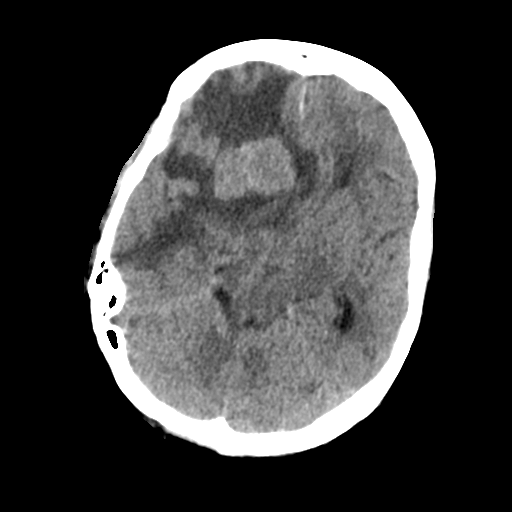
[im 16/33  brain]
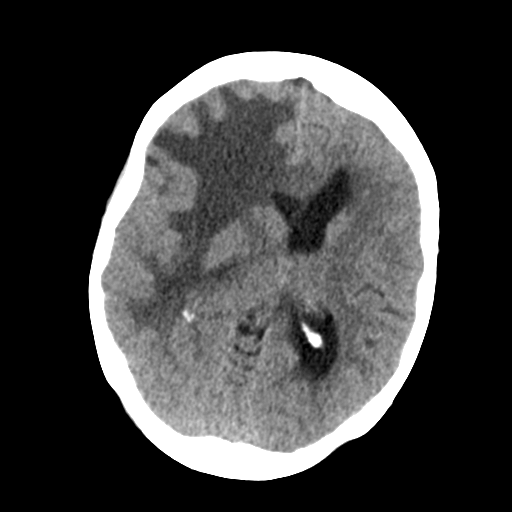
[im 16/33  bone]
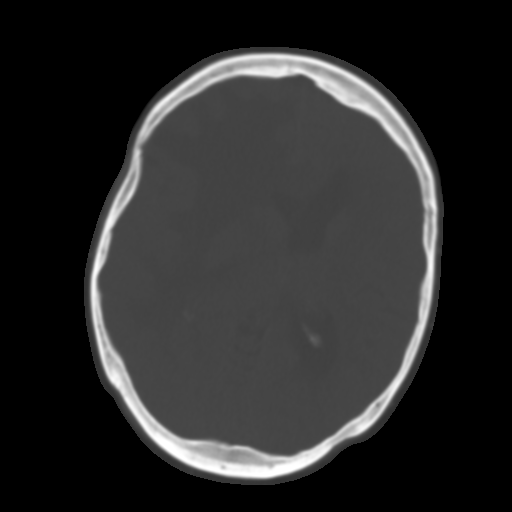
[im 17/33  brain]
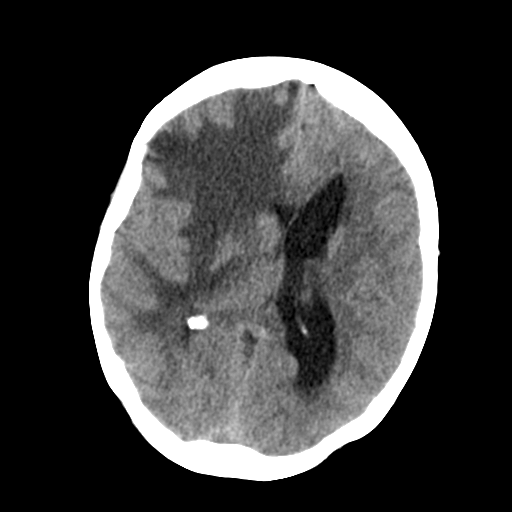
[im 21/33  brain]
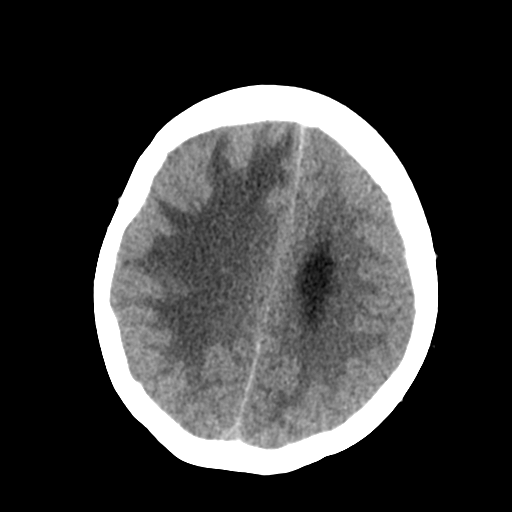
[im 24/33  brain]
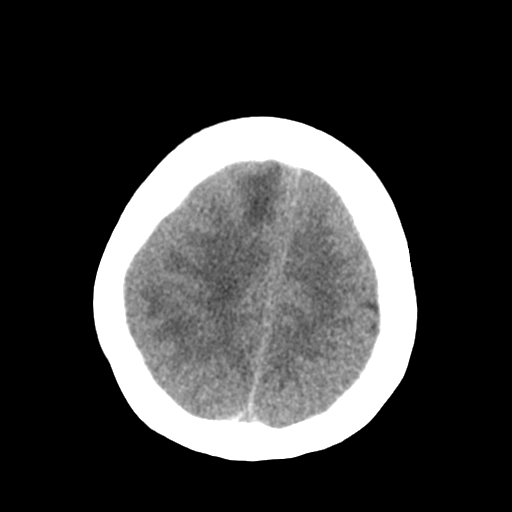
[im 27/33  brain]
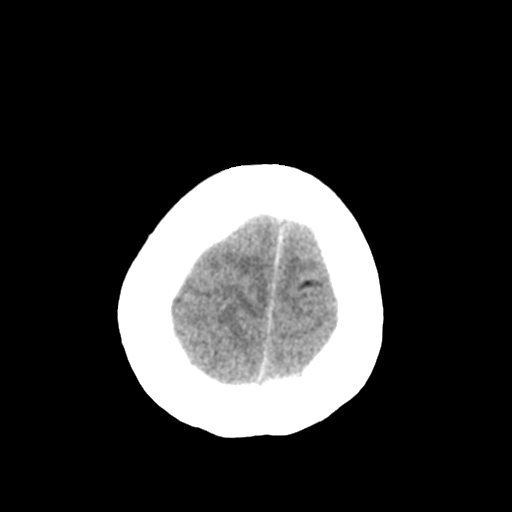
[im 27/33  bone]
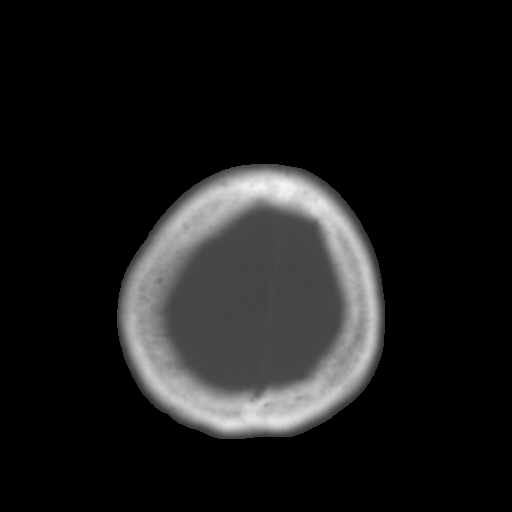
[im 31/33  brain]
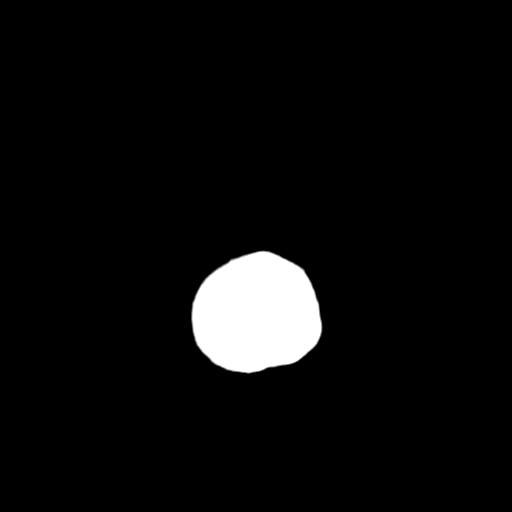

[Series 5: coronal soft · coronal · 0.34mm/px · 3 of 64 slices shown]
[im 22/64  brain]
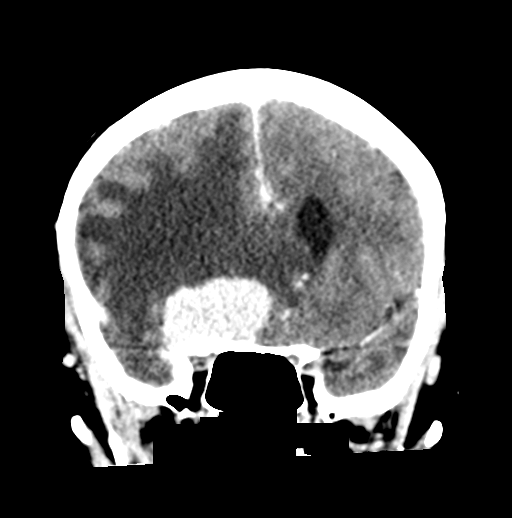
[im 29/64  brain]
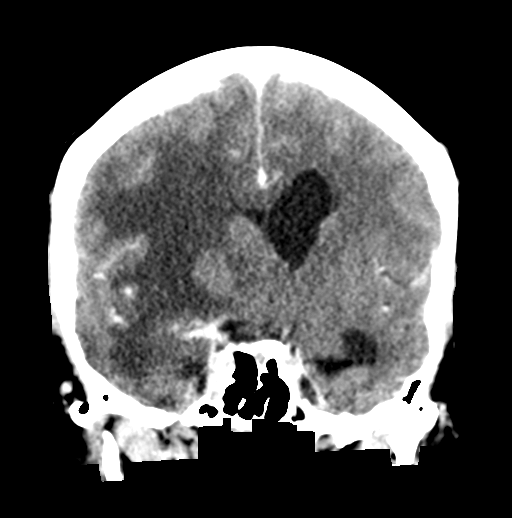
[im 36/64  brain]
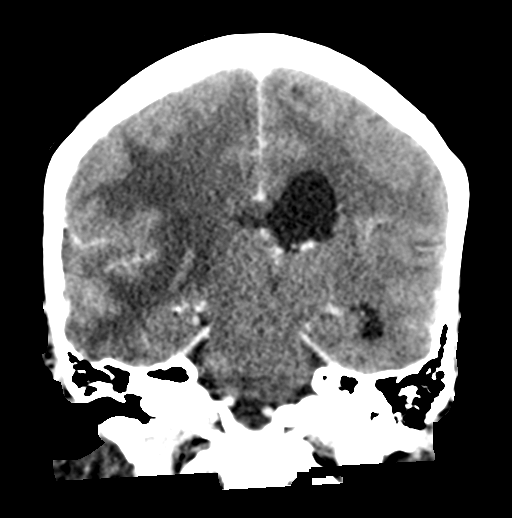

[Series 6: sagittal soft · sagittal · 0.34mm/px · 3 of 58 slices shown]
[im 20/58  brain]
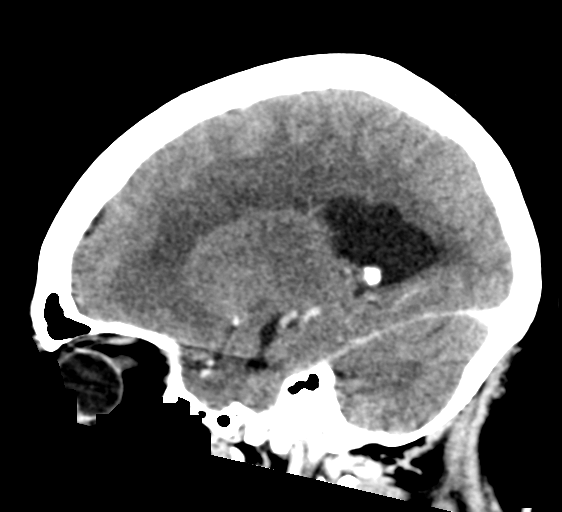
[im 29/58  brain]
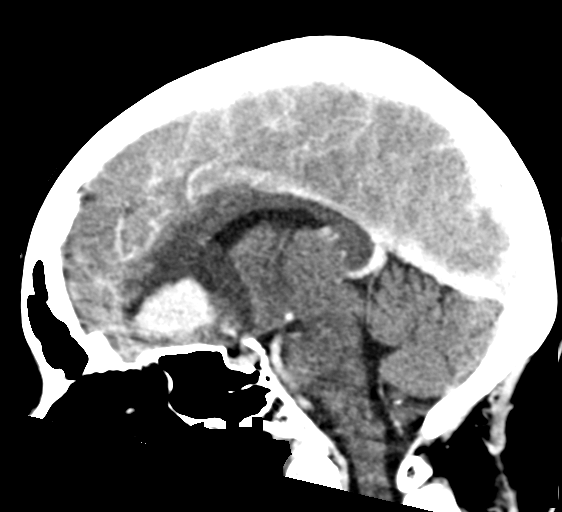
[im 39/58  brain]
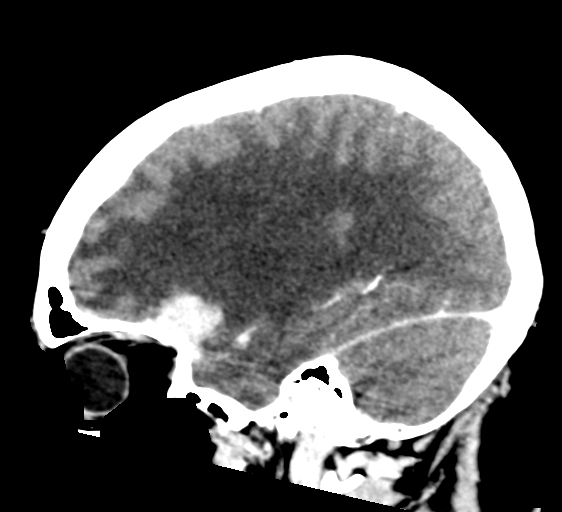

[16 of 47 positions shown; findings below may reference images not displayed]

FINDINGS: Brain: There is a homogeneously enhancing extra-axial mass of the
anterior cranial fossa measuring 3.5 x 2.2 cm. There is a large
amount of vasogenic edema in the overlying right frontal lobe. There
is leftward midline shift with subfalcine herniation of the right
cingulate gyrus. There is no intracranial hemorrhage or other
extra-axial collection. The right lateral ventricle is effaced in
the left is mildly dilated.

Vascular: No hyperdense vessel or unexpected calcification. Visible
vessels are patent.

Skull: Normal. Negative for fracture or focal lesion.

Sinuses/Orbits: No acute finding.

Other: None.
IMPRESSION: Anterior cranial fossa meningioma causing severe vasogenic edema
within the right frontal lobe. There is leftward midline shift with
subfalcine herniation of the right cingulate gyrus with early
entrapment of the left lateral ventricle.

Critical Value/emergent results were called by telephone at the time
of interpretation on [DATE] at [DATE] to provider MOTOMI
, who verbally acknowledged these results.

## 2020-09-04 IMAGING — CT CT RENAL STONE PROTOCOL
2 of 7 series · 16 of 46 positions shown, 18 images · non-contrast
Comparison: CT [DATE]

CLINICAL DATA: Hematuria

EXAM:
CT ABDOMEN AND PELVIS WITHOUT CONTRAST
TECHNIQUE: Multidetector CT imaging of the abdomen and pelvis was performed
following the standard protocol without IV contrast.

[Series 2: axial st · axial · 0.82mm/px · z∈[-712,-337]mm · 13 of 89 slices shown, 15 images]
[im 7/89  soft-tissue]
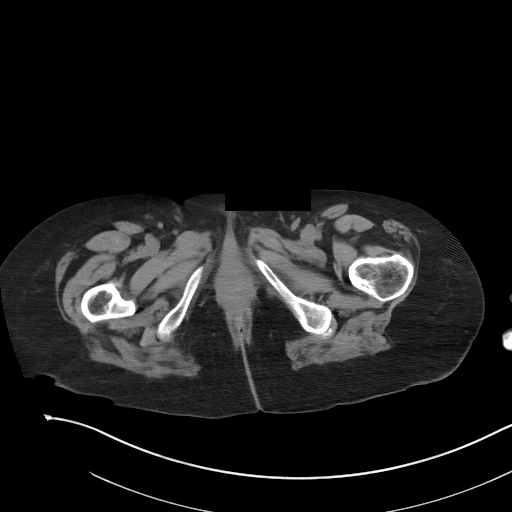
[im 7/89  bone]
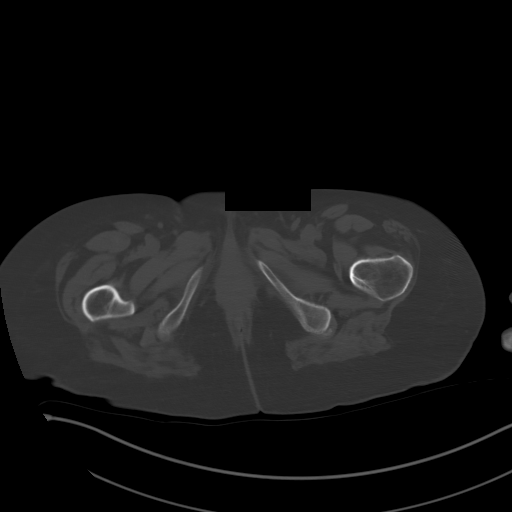
[im 13/89  soft-tissue]
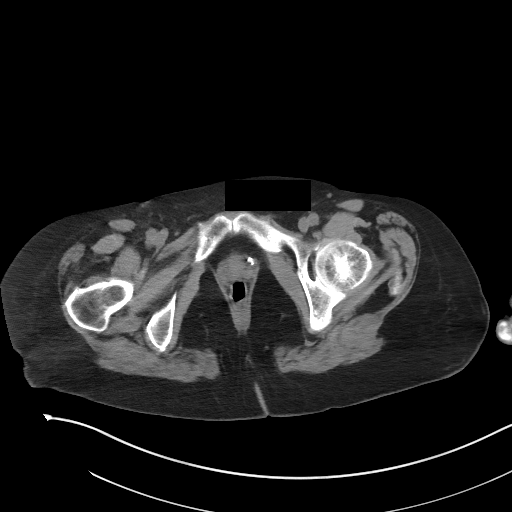
[im 19/89  soft-tissue]
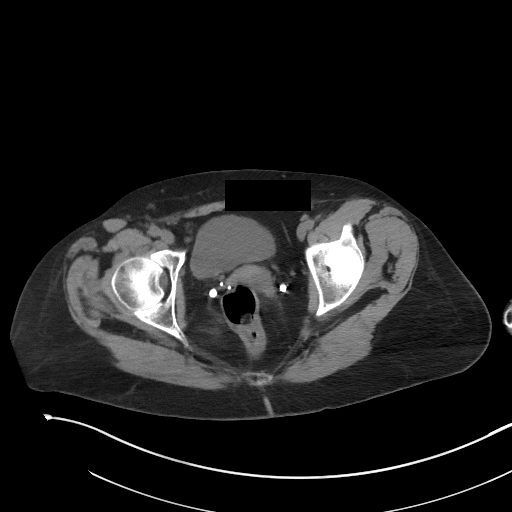
[im 26/89  soft-tissue]
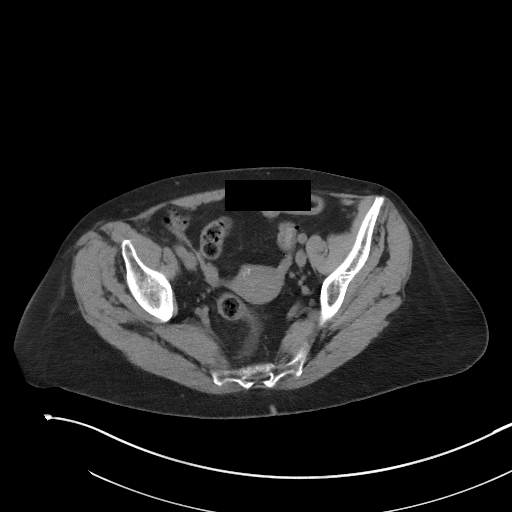
[im 32/89  soft-tissue]
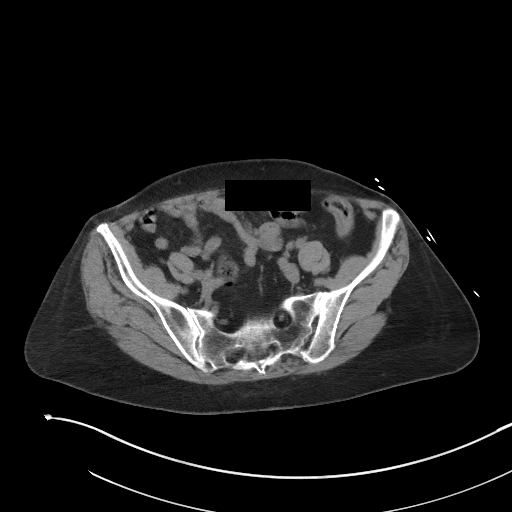
[im 38/89  soft-tissue]
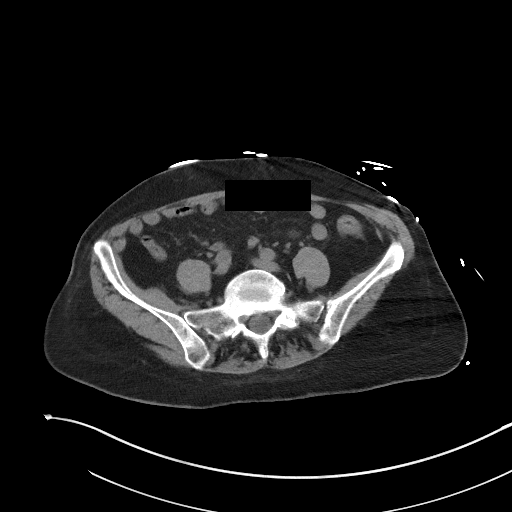
[im 45/89  soft-tissue]
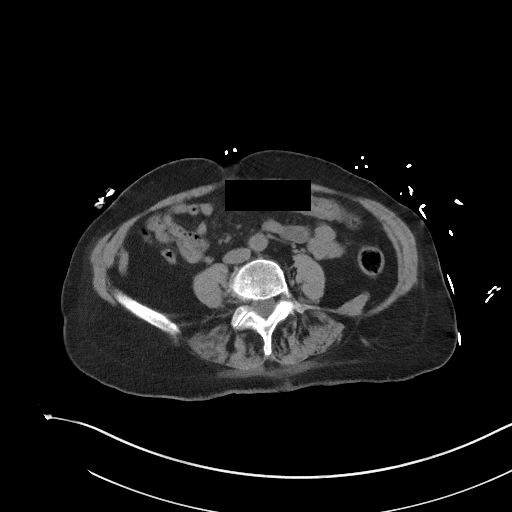
[im 51/89  soft-tissue]
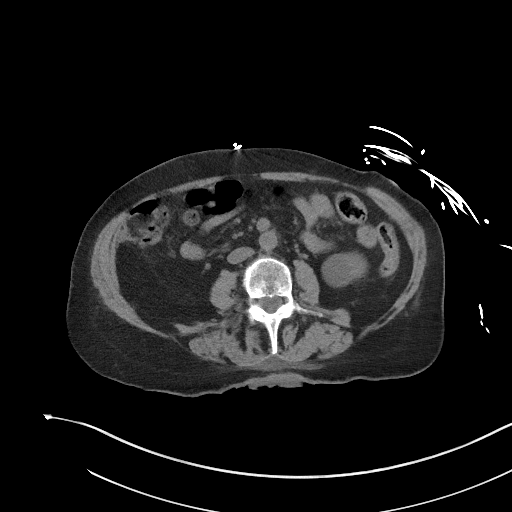
[im 57/89  soft-tissue]
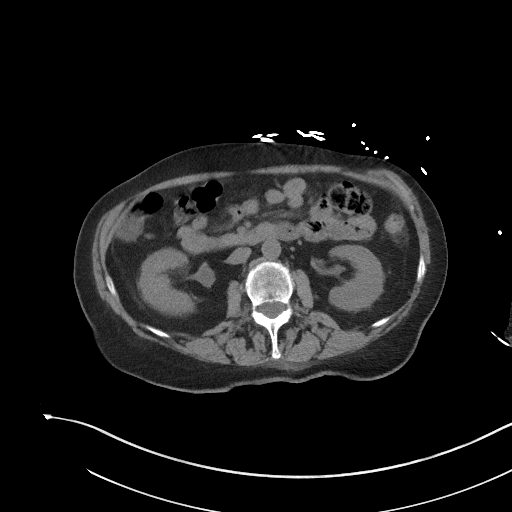
[im 57/89  bone]
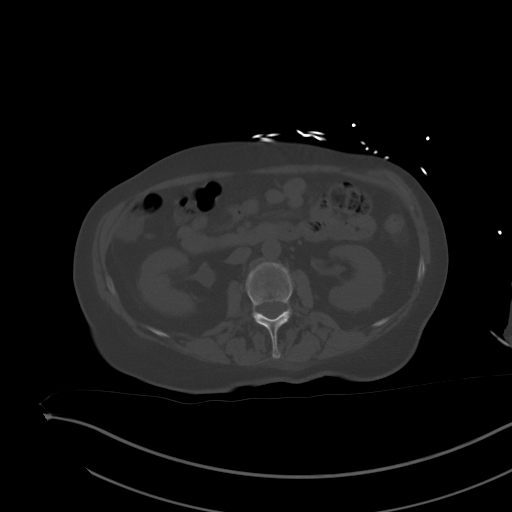
[im 63/89  soft-tissue]
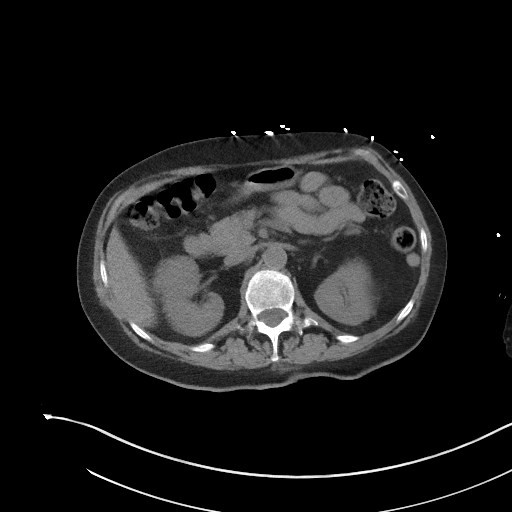
[im 70/89  soft-tissue]
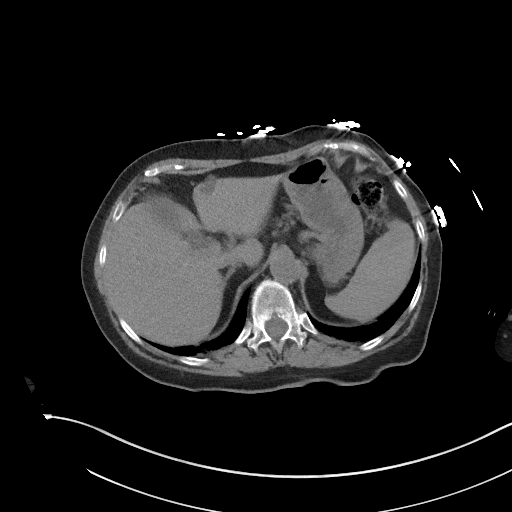
[im 76/89  soft-tissue]
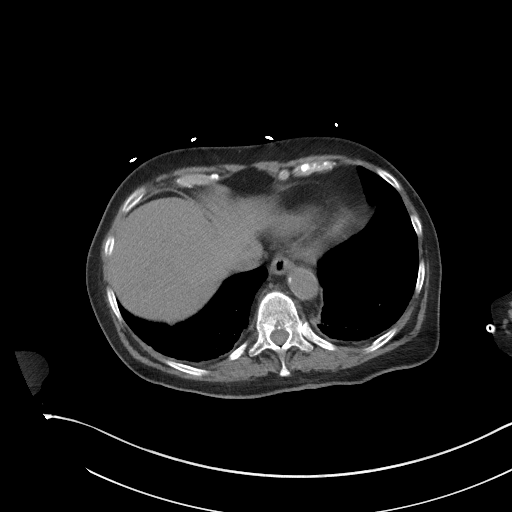
[im 82/89  soft-tissue]
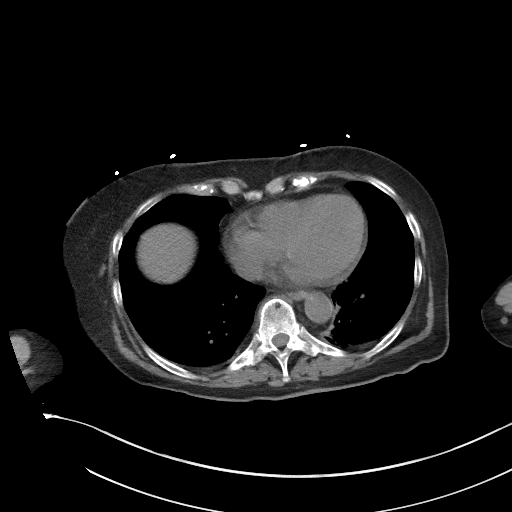

[Series 5: coronal st · coronal · 0.73mm/px · 3 of 81 slices shown]
[im 17/81  soft-tissue]
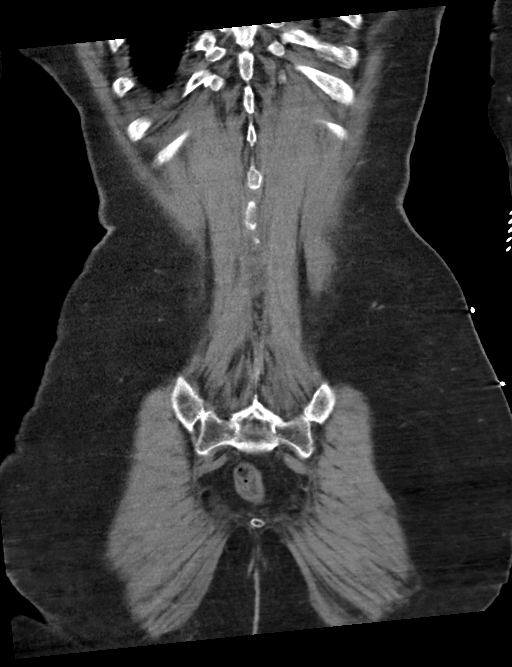
[im 33/81  soft-tissue]
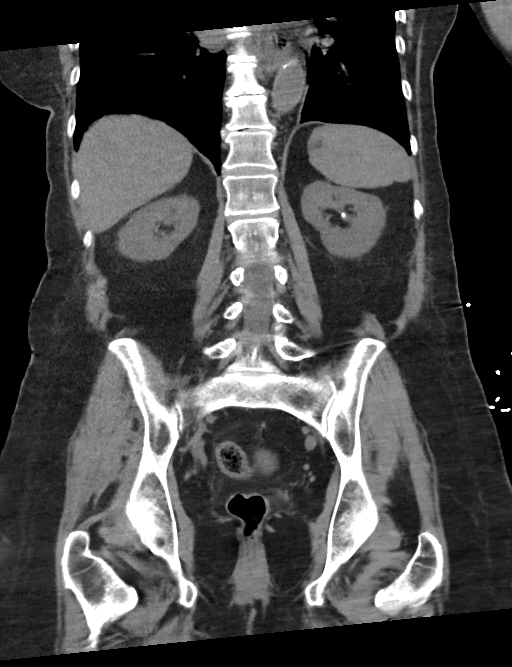
[im 49/81  soft-tissue]
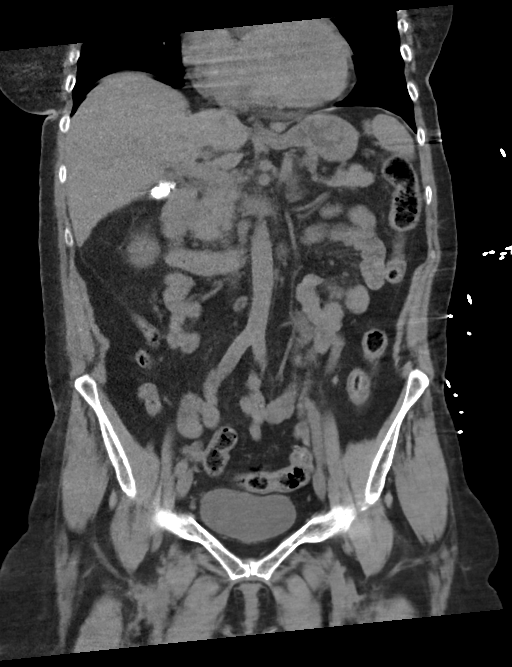

[16 of 46 positions shown; findings below may reference images not displayed]

FINDINGS: Lower chest: Lung bases demonstrate mild dependent atelectasis.
Normal cardiac size.

Hepatobiliary: Multiple calcified gallstones. No biliary dilatation.
Stable probable hepatic cyst in the left hepatic lobe

Pancreas: Unremarkable. No pancreatic ductal dilatation or
surrounding inflammatory changes.

Spleen: Normal in size without focal abnormality.

Adrenals/Urinary Tract: Adrenal glands are normal. Cyst off the
lower pole left kidney. 3 mm stone in the midpole of the left
kidney. 2 mm stone in the mid right kidney. Mild right renal pelvis
dilatation without significant hydroureter, there is a 3 mm stone in
the mid to distal right ureter at the level of the upper sacrum. The
urinary bladder is unremarkable.

Stomach/Bowel: The stomach is nonenlarged. No dilated small bowel.
Negative appendix. Possible mild wall thickening of the distal
descending/proximal sigmoid colon. Scattered left colon diverticula

Vascular/Lymphatic: Nonaneurysmal aorta.  No suspicious nodes

Reproductive: Probable small calcified uterine fibroid. No adnexal
mass

Other: Negative for free air or free fluid

Musculoskeletal: No acute or significant osseous findings.
IMPRESSION: 1. Mild right renal pelvis dilatation without significant
hydroureter, secondary to a 3 mm stone in the mid to distal right
ureter at the level of the upper sacrum.
2. Small intrarenal stones bilaterally.
3. Possible mild wall thickening of the distal descending/proximal
sigmoid colon, could reflect a mild colitis of infectious,
inflammatory, or ischemic etiology.
4. Gallstones.

## 2020-09-04 MED ORDER — POTASSIUM CHLORIDE IN NACL 20-0.9 MEQ/L-% IV SOLN: INTRAVENOUS | Status: DC

## 2020-09-04 MED ORDER — SODIUM CHLORIDE 0.9 % IV SOLN
1.0000 g | Freq: Once | INTRAVENOUS | Status: AC
  Administered 2020-09-04: 19:00:00 1 g via INTRAVENOUS
  Filled 2020-09-04: qty 10

## 2020-09-04 MED ORDER — DEXAMETHASONE SODIUM PHOSPHATE 10 MG/ML IJ SOLN
10.0000 mg | Freq: Once | INTRAMUSCULAR | Status: AC
  Administered 2020-09-04: 21:00:00 10 mg via INTRAVENOUS
  Filled 2020-09-04: qty 1

## 2020-09-04 MED ORDER — SODIUM CHLORIDE 0.9 % IV SOLN: INTRAVENOUS | Status: DC

## 2020-09-04 MED ORDER — ONDANSETRON HCL 4 MG PO TABS: 4.0000 mg | ORAL_TABLET | Freq: Four times a day (QID) | ORAL | Status: DC | PRN

## 2020-09-04 MED ORDER — ACETAMINOPHEN 325 MG PO TABS
650.0000 mg | ORAL_TABLET | Freq: Four times a day (QID) | ORAL | Status: DC | PRN
  Administered 2020-09-05 – 2020-09-11 (×3): 650 mg via ORAL
  Filled 2020-09-04 (×3): qty 2

## 2020-09-04 MED ORDER — SODIUM CHLORIDE 0.9 % IV SOLN
1.0000 g | INTRAVENOUS | Status: DC
  Administered 2020-09-05 – 2020-09-06 (×2): 1 g via INTRAVENOUS
  Filled 2020-09-04 (×2): qty 10

## 2020-09-04 MED ORDER — DEXAMETHASONE SODIUM PHOSPHATE 4 MG/ML IJ SOLN
4.0000 mg | Freq: Four times a day (QID) | INTRAMUSCULAR | Status: DC
  Administered 2020-09-05 – 2020-09-12 (×27): 4 mg via INTRAVENOUS
  Filled 2020-09-04 (×28): qty 1

## 2020-09-04 MED ORDER — ACETAMINOPHEN 650 MG RE SUPP: 650.0000 mg | Freq: Four times a day (QID) | RECTAL | Status: DC | PRN

## 2020-09-04 MED ORDER — IOHEXOL 300 MG/ML  SOLN
75.0000 mL | Freq: Once | INTRAMUSCULAR | Status: AC | PRN
  Administered 2020-09-04: 75 mL via INTRAVENOUS

## 2020-09-04 MED ORDER — ONDANSETRON HCL 4 MG/2ML IJ SOLN: 4.0000 mg | Freq: Four times a day (QID) | INTRAMUSCULAR | Status: DC | PRN

## 2020-09-04 NOTE — ED Provider Notes (Signed)
Discussed with Dr. 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  No inpatient beds available, ED to ED transfer recommended.  Case has been discussed with Dr. Stark Jock, ED physician at [facility] who agrees to accept the patient.  She will continue as an admitted patient under [facility] service and neurosurgery on consult.   Delora Fuel, MD [id number]

## 2020-09-04 NOTE — ED Provider Notes (Signed)
[facility] EMERGENCY DEPARTMENT Provider Note   CSN: [id number] Arrival date & time: 09/04/20  1507     History Chief Complaint  Patient presents with  . Weakness    [name] Page is a 69 y.o. female with PMH of iron deficiency anemia on 325 mg ferrous sulfate who presents to the ED via EMS with complaints of weakness and black stools.  Patient is currently scheduled for an upper and lower endoscopy with Dr. Carlean Purl.    On my examination, patient is accompanied by her daughter who is at bedside.  Patient lives alone.  Evidently she has been having progressively worsening iron deficiency anemia over the course of the past few months.  She states that her weakness has also been progressive.  Today she developed a headache that she suspects may be related to her liquid diet in preparation for upper and lower endoscopy tomorrow.  However, her daughter states that patient was too weak to even stand up off of the couch these past couple of days and given her rapid progression of weakness and dyspnea on exertion, wanted her evaluated in the ED.  She denies any cough, shortness of breath, chest pain, abdominal pain, current nausea, dysuria, hematuria, or changes in bowel habits.    On another note, patient also has multiple areas of SCC and has not been seen by an oncologist or other practitioner.  In fact, she hides the lesions from her daughter and dresses them independently.    HPI     Past Medical History:  Diagnosis Date  . Arthritis   . Basal cell carcinoma   . 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 Cancer ([place])    melanoma  . Dysuria   . Frequency of urination   . Hematuria   . History of kidney stones   . Osteoporosis   . Renal calculus, bilateral   . 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 Urgency of urination   . Wears glasses     Patient Active Problem List   Diagnosis Date Noted  . AMS (altered mental status) 09/04/2020  .  Elevated blood pressure 11/01/2015  . Osteoporosis 10/30/2015    Past Surgical History:  Procedure Laterality Date  . CYSTOSCOPY WITH RETROGRADE PYELOGRAM, URETEROSCOPY AND STENT PLACEMENT Bilateral 08/11/2013   Procedure: CYSTOSCOPY WITH RETROGRADE PYELOGRAM, URETEROSCOPY AND STENT PLACEMENT;  Surgeon: Alexis Frock, MD;  Location: [facility];  Service: Urology;  Laterality: Bilateral;  . CYSTOSCOPY WITH URETEROSCOPY AND STENT PLACEMENT Bilateral 08/29/2013   Procedure: CYSTOSCOPY WITH BILATERAL URETEROSCOPY AND STENT REPLACEMENTS, stone extraction;  Surgeon: Alexis Frock, MD;  Location: [facility];  Service: Urology;  Laterality: Bilateral;  . CYSTOSCOPY/RETROGRADE/URETEROSCOPY/STONE EXTRACTION WITH BASKET Bilateral 01/31/2016   Procedure: CYSTOSCOPY/BILATERAL RETROGRADE PYELOGRAMS/BILATERAL URETEROSCOPY/BILATERAL STONE EXTRACTION WITH BASKET/RIGHT URETERAL STENT PLACEMENT;  Surgeon: Raynelle Bring, MD;  Location: [facility];  Service: Urology;  Laterality: Bilateral;  . HEMIARTHROPLASTY SHOULDER FRACTURE Left 1992  . HOLMIUM LASER APPLICATION Bilateral 01/75/1025   Procedure: HOLMIUM LASER APPLICATION;  Surgeon: Alexis Frock, MD;  Location: [facility];  Service: Urology;  Laterality: Bilateral;     OB History   No obstetric history on file.     Family History  Problem Relation Age of Onset  . Cancer Mother        unknown source, stomach and liver   . Atrial fibrillation Father   . Hyperlipidemia Father   . Hypertension  Father   . Colon polyps Father   . Hypertension Sister   . Hyperlipidemia Sister   . Thyroid disease Sister   . Esophageal cancer Neg Hx   . Pancreatic cancer Neg Hx     Social History   Tobacco Use  . Smoking status: Never Smoker  . Smokeless tobacco: Never Used  Vaping Use  . Vaping Use: Never used  Substance Use Topics  . Alcohol use: No  . Drug use: No    Home Medications Prior to Admission medications   Medication  Sig Start Date End Date Taking? Authorizing Provider  alendronate (FOSAMAX) 70 MG tablet TAKE 1 TABLET (70 MG TOTAL) BY MOUTH EVERY 7 (SEVEN) DAYS. REPORTED ON 10/30/2015 03/31/20  Yes Gottschalk, Ashly M, DO  atorvastatin (LIPITOR) 20 MG tablet TAKE 1 TABLET BY MOUTH EVERY DAY 08/22/20  Yes Ronnie Doss M, DO  Calcium Carbonate-Vit D-Min (CALCIUM 1200 PO) Take 1 tablet by mouth daily.   Yes [provider]  escitalopram (LEXAPRO) 10 MG tablet TAKE 1 TABLET BY MOUTH EVERY DAY 08/11/20  Yes Hassell Done, Mary-Margaret, FNP  ferrous sulfate 325 (65 FE) MG tablet Take 325 mg by mouth daily with breakfast.   Yes [provider]  Glycerin-Hypromellose-PEG 400 0.2-0.2-1 % SOLN Apply 1 drop to eye 2 (two) times daily as needed (dry eyes).    Yes [provider]  ibuprofen (ADVIL,MOTRIN) 200 MG tablet Take 200-400 mg by mouth every 6 (six) hours as needed for moderate pain.   Yes [provider]  Multiple Vitamin (MULTIVITAMIN WITH MINERALS) TABS tablet Take 1 tablet by mouth daily.   Yes [provider]  loratadine (CLARITIN) 10 MG tablet Take 10 mg by mouth daily as needed for allergies.    [provider]    Allergies    Sulfa antibiotics  Review of Systems   Review of Systems  All other systems reviewed and are negative.   Physical Exam Updated Vital Signs BP (!) 157/74 (BP Location: Right Arm)   Pulse 78   Temp 98 F (36.7 C) (Oral)   Resp 15   SpO2 99%   Physical Exam Vitals and nursing note reviewed. Exam conducted with a chaperone present.  Constitutional:      General: She is not in acute distress.    Appearance: She is not toxic-appearing.  HENT:     Head: Normocephalic and atraumatic.  Eyes:     General: No scleral icterus.    Conjunctiva/sclera: Conjunctivae normal.  Cardiovascular:     Rate and Rhythm: Normal rate and regular rhythm.     Pulses: Normal pulses.     Heart sounds: Normal heart sounds.  Pulmonary:      Effort: Pulmonary effort is normal. No respiratory distress.     Breath sounds: Normal breath sounds.  Abdominal:     General: Abdomen is flat. There is no distension.     Palpations: Abdomen is soft.     Tenderness: There is no abdominal tenderness.  Genitourinary:    Comments: Anorectal exam: No obvious fissure or hemorrhoids noted.  Dark black stool. Hemoccult positive, but not overwhelming.  Unclear as to melena versus black from ferrous sulfate. Musculoskeletal:     Cervical back: Normal range of motion. No rigidity.  Skin:    General: Skin is dry.     Capillary Refill: Capillary refill takes less than 2 seconds.     Comments: Large, 6 x 6 cm irregular mass on anterior chest wall concerning for  malignancy.  Well dressed.  No surrounding induration or erythema. Additional 4 x 5 cm similarly irregular erythematous mass on low back, midline lumbar region.  Neurological:     Mental Status: She is alert and oriented to person, place, and time.     GCS: GCS eye subscore is 4. GCS verbal subscore is 5. GCS motor subscore is 6.  Psychiatric:        Mood and Affect: Mood normal.        Behavior: Behavior normal.        Thought Content: Thought content normal.        ED Results / Procedures / Treatments   Labs (all labs ordered are listed, but only abnormal results are displayed) Labs Reviewed  BASIC METABOLIC PANEL - Abnormal; Notable for the following components:      Result Value   Glucose, Bld 126 (*)    Creatinine, Ser 1.23 (*)    GFR, Estimated 47 (*)    All other components within normal limits  CBC - Abnormal; Notable for the following components:   WBC 11.5 (*)    Hemoglobin 9.4 (*)    HCT 33.1 (*)    MCH 23.6 (*)    MCHC 28.4 (*)    RDW 23.7 (*)    All other components within normal limits  COMPREHENSIVE METABOLIC PANEL - Abnormal; Notable for the following components:   Glucose, Bld 127 (*)    Creatinine, Ser 1.24 (*)    Calcium 8.7 (*)    GFR, Estimated 47 (*)     All other components within normal limits  URINALYSIS, ROUTINE W REFLEX MICROSCOPIC - Abnormal; Notable for the following components:   APPearance HAZY (*)    Hgb urine dipstick LARGE (*)    Ketones, ur 20 (*)    Protein, ur 100 (*)    Leukocytes,Ua MODERATE (*)    RBC / HPF >50 (*)    WBC, UA >50 (*)    Bacteria, UA RARE (*)    Non Squamous Epithelial 0-5 (*)    All other components within normal limits  RESP PANEL BY RT-PCR (FLU A&B, COVID) ARPGX2  URINE CULTURE  PROTIME-INR  POC OCCULT BLOOD, ED    EKG EKG Interpretation  Date/Time:  Thursday September 04 2020 15:27:58 EST Ventricular Rate:  80 PR Interval:    QRS Duration: 85 QT Interval:  415 QTC Calculation: 479 R Axis:   92 Text Interpretation: Sinus rhythm Right axis deviation No STEMI Confirmed by Octaviano Glow [phone]) on 09/04/2020 4:35:50 PM   Radiology CT Head W or Wo Contrast  Result Date: 09/04/2020 CLINICAL DATA:  Encephalopathy EXAM: CT HEAD WITHOUT AND WITH CONTRAST TECHNIQUE: Contiguous axial images were obtained from the base of the skull through the vertex without and with intravenous contrast CONTRAST:  48mL OMNIPAQUE IOHEXOL 300 MG/ML  SOLN COMPARISON:  None. FINDINGS: Brain: There is a homogeneously enhancing extra-axial mass of the anterior cranial fossa measuring 3.5 x 2.2 cm. There is a large amount of vasogenic edema in the overlying right frontal lobe. There is leftward midline shift with subfalcine herniation of the right cingulate gyrus. There is no intracranial hemorrhage or other extra-axial collection. The right lateral ventricle is effaced in the left is mildly dilated. Vascular: No hyperdense vessel or unexpected calcification. Visible vessels are patent. Skull: Normal. Negative for fracture or focal lesion. Sinuses/Orbits: No acute finding. Other: None. 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 There is  leftward midline shift  with subfalcine herniation of the right cingulate gyrus with early entrapment of the left lateral ventricle. Critical Value/emergent results were called by telephone at the time of interpretation on 09/04/2020 at 8:44 pm to provider Isiaih Hollenbach , who verbally acknowledged these results. Electronically Signed   By: Ulyses Jarred M.D.   On: 09/04/2020 20:46   CT Renal Stone Study  Result Date: 09/04/2020 CLINICAL DATA:  Hematuria EXAM: CT ABDOMEN AND PELVIS WITHOUT CONTRAST TECHNIQUE: Multidetector CT imaging of the abdomen and pelvis was performed following the standard protocol without IV contrast. COMPARISON:  CT 01/27/2019 FINDINGS: Lower chest: Lung bases demonstrate mild dependent atelectasis. Normal cardiac size. Hepatobiliary: Multiple calcified gallstones. No biliary dilatation. Stable probable hepatic cyst in the left hepatic lobe Pancreas: Unremarkable. No pancreatic ductal dilatation or surrounding inflammatory changes. Spleen: Normal in size without focal abnormality. Adrenals/Urinary Tract: Adrenal glands are normal. Cyst off the lower pole left kidney. 3 mm stone in the midpole of the left kidney. 2 mm stone in the mid right kidney. Mild right renal pelvis dilatation without significant hydroureter, there is a 3 mm stone in the mid to distal right ureter at the level of the upper sacrum. The urinary bladder is unremarkable. Stomach/Bowel: The stomach is nonenlarged. No dilated small bowel. Negative appendix. Possible mild wall thickening of the distal descending/proximal sigmoid colon. Scattered left colon diverticula Vascular/Lymphatic: Nonaneurysmal aorta.  No suspicious nodes Reproductive: Probable small calcified uterine fibroid. No adnexal mass Other: Negative for free air or free fluid Musculoskeletal: No acute or significant osseous findings. IMPRESSION: 1. Mild right renal pelvis dilatation without significant hydroureter, secondary to a 3 mm stone in the mid to distal right ureter at the  level of the upper sacrum. 2. Small intrarenal stones bilaterally. 3. Possible mild wall thickening of the distal descending/proximal sigmoid colon, could reflect a mild colitis of infectious, inflammatory, or ischemic etiology. 4. Gallstones. Electronically Signed   By: Donavan Foil M.D.   On: 09/04/2020 19:16    Procedures .Critical Care Performed by: Corena Herter, PA-C Authorized by: Corena Herter, PA-C   Critical care provider statement:    Critical care time (minutes):  45   Critical care was time spent personally by me on the following activities:  Discussions with consultants, evaluation of patient's response to treatment, examination of patient, ordering and performing treatments and interventions, ordering and review of laboratory studies, ordering and review of radiographic studies, pulse oximetry, re-evaluation of patient's condition, obtaining history from patient or surrogate and review of old charts Comments:     Consulted multiple specialists including GI, urology, and neurosurgery.  Multiple different acute and subacute disease with contribution to patient's clinical weakness and worsening confusion.     (including critical care time)  Medications Ordered in ED Medications  cefTRIAXone (ROCEPHIN) 1 g in sodium chloride 0.9 % 100 mL IVPB (0 g Intravenous Stopped 09/04/20 2001)  iohexol (OMNIPAQUE) 300 MG/ML solution 75 mL (75 mLs Intravenous Contrast Given 09/04/20 1845)  dexamethasone (DECADRON) injection 10 mg (10 mg Intravenous Given 09/04/20 2052)    ED Course  I have reviewed the triage vital signs and the nursing notes.  Pertinent labs & imaging results that were available during my care of the patient were reviewed by me and considered in my medical decision making (see chart for details).  Clinical Course as of 09/04/20 2133  Thu Sep 04, 2020  1657 I spoke with [facility], her upper and lower endoscopy was supposed  to be done on outpatient basis so if she  requires hospitalization then she will need it rescheduled.  [GG]  1919  IMPRESSION: 1. Mild right renal pelvis dilatation without significant hydroureter, secondary to a 3 mm stone in the mid to distal right ureter at the level of the upper sacrum. 2. Small intrarenal stones bilaterally. 3. Possible mild wall thickening of the distal descending/proximal sigmoid colon, could reflect a mild colitis of infectious, inflammatory, or ischemic etiology. 4. Gallstones. [MT]  23 69 yo female here with acute on chronic confusion for 1 week, her daughter reports patient has had progressive mental and cognitive decline for months but appears to have worsened in past week.  Pt lives alone but daughter lives nearby and checks on her.  Pt too weak to get out of bed and having falls at home.  Pt has fungating mass on chest and back concerning for skin malignancy she never followed up on.  Photos in media tab in chart now.  Her workup is notable for likely cystitis with right sided kidney stone mid-ureter.  No sign of pyelo.  No evidence of sepsis.  Her CT head shows likely brain mets.  Plan to give IV rocephin, will discuss with NSGY as needed, and provide medical admission.  Daughter updated regarding plan and in agreement [MT]  1928 I spoke with Dr. Denton Brick who will admit patient. [GG]  1928 Shortly after speaking with hospitalist, CT renal stone study resulted demonstrating a 3 mm stone in mid to distal right ureter without significant hydroureter.   [GG]  2042 Spoke with radiology.  Patient has a meningioma with herniation and edema.  Suspect combination of subacute and acute. [GG]  2112 I spoke with Dr. Reatha Armour, neurosurgery, who recommends that she be admitted to hospitalist services at [facility] and he will round on her there and continue with steroid management.  [GG]  2118 I spoke with Dr. Denton Brick who will admit patient to [facility]. [GG]    Clinical Course User Index [GG] Corena Herter,  PA-C [MT] Wyvonnia Dusky, MD   MDM Rules/Calculators/A&P                          Per most recent note from [facility], her hemoglobin was 7.6 on 07/21/2020.  I spoke with [facility], her upper and lower endoscopy was supposed to be done on outpatient basis so if she requires hospitalization then she will need it rescheduled.   Patient has multiple cancerous lesions including one noted in the picture and physical exam.  On reexamination, patient's daughter states that while her weakness has been subacute over 3 months, there has been an acute change in worsening over the course of the past week.  She is acting more confused and has sustained multiple falls at home.  She also informs [place] that she has a history of nephrolithiasis.  Given leukocytosis to 11.5 in conjunction with her reported altered mental status per daughter and evidence of UTI, will start patient on IV Rocephin and plan for admission for ongoing evaluation and management.  Urine culture obtained and in process.  Will obtain CT imaging of her head to evaluate for metastatic disease as cause for her confusion.  We will also obtain a CT renal stone study given history of ureterolithiasis in conjunction with her weakness and UTI to assess for infected kidney stone.  Patient was personally evaluated by Dr. Langston Masker who agrees with assessment and  plan.    While daughter thinks that patient would benefit from SNF placement given her current deterioration, if she returns to her baseline after antibiotics for UTI, she suspects that she would be able to continue providing care for her at home.    Shortly after speaking with hospitalist, CT renal stone study resulted demonstrating a 3 mm stone in mid to distal right ureter without significant hydroureter.  Will consult urology.  Spoke with Dr. Caprice Beaver, urology.  He is reassured that she is afebrile with only mild elevated WBC and no significant hydroureter.  Suspect that she will do well with  continued IV antibiotics.  He will round on her if she is admitted to [facility] and not transferred.  Spoke with radiology.  Patient has a meningioma with herniation and edema.  Suspect combination of subacute and acute.  Will start patient on Decadron and consult with neurosurgery.  I spoke with Dr. Reatha Armour, neurosurgery, who recommends that she be admitted to hospitalist services at [facility] and he will round on her there and continue with steroid management.   Final Clinical Impression(s) / ED Diagnoses Final diagnoses:  Weakness  Acute cystitis with hematuria    Rx / DC Orders ED Discharge Orders    None       Corena Herter, PA-C 09/04/20 2134    Wyvonnia Dusky, MD 09/05/20 [phone]

## 2020-09-04 NOTE — Telephone Encounter (Signed)
Noted - no charge for cancellation

## 2020-09-04 NOTE — Telephone Encounter (Signed)
Good afternoon!  Patient daughter called to cancel appt for patient. Patient scheduled for (Fri) 09/05/20  ENDO/COL Dr. Carlean Purl.  Patient is currently at [facility]. Patient daughter not sure if patient will be admitted to hospital.  Thank you

## 2020-09-04 NOTE — ED Triage Notes (Signed)
Pt fom home via [facility]. Pt reports increased weakness and black stools. Pt states she was recently told she was anemic and started on iron. Pt is due for colonoscopy and endoscopy tomorrow.

## 2020-09-04 NOTE — H&P (Addendum)
History and Physical    [name] Page:[id number] DOB: [birth year] DOA: 09/04/2020  PCP: Janora Norlander, DO   Patient coming from: Home  I have personally briefly reviewed patient's old medical records in [place]  Chief Complaint: Confusion  HPI: [name] Page is a 69 y.o. female with medical history significant for squamous cell carcinoma of the chest, kidney stones. Patient presented to the ED with complaints of weakness and black stools.  Patient is not a good historian, most of the history is obtained with the assistance of patient's daughter present at bedside.   Weakness has been ongoing for about 2 months.  Patient reports black stool started when she is started taking iron pills.  Prior to that she had no black stools, and there has been no recent change in her stools. Patient's daughter reports intermittent episodes of forgetfulness, patient being confused about time of the day-thinking it is morning when it is evening or vice versa.  Daughter denies abnormal speech.  Patient also reports headache over the past year, but reports recently headaches have significantly worsened.  Headaches resolve with Advil.  Patient reports few episodes of vomiting.  Patient also endorses urinary incontinence, urinary frequency.  No change in bowel habits. She denies pain with urination, no abdominal pain..  No significant weight loss. Patient reports generalized weakness particularly involving her bilateral lower extremities.  Patient is able to walk without assistance, is sometimes unsteady, and when she is on the floor she needs assistance to get up.  There have been no falls. Patient has a mass on her chest, she has not sought medical care for this, and daughter was not quite aware about the size of the mass.  Patient has a history of kidney stones and frequently passes stones in her urine.  ED Course: Blood pressure systolic 390Z to 009, heart rate 78-92.  Temperature 98.5.   Respiratory rate 15-22.  O2 sats 92 to 99% on room air.  WBC 11.5.  UA with large hemoglobin moderate leukocytes greater than 50 WBCs.  EKG shows sinus rhythm without acute abnormalities. 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 EDP talked to neurosurgeon on-call, Dr. Reatha Armour, recommended admission under hospitalist service [facility].  Start Decadron 10 mg daily.  Abdominal CT showed 3 mm stone without significant hydroureter, EDP talked to Dr. Caprice Beaver, recommended continue antibiotics.  Review of Systems: As per HPI all other systems reviewed and negative.  Past Medical History:  Diagnosis Date  . Arthritis   . Basal cell carcinoma   . 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 Cancer ([place])    melanoma  . Dysuria   . Frequency of urination   . Hematuria   . History of kidney stones   . Osteoporosis   . Renal calculus, bilateral   . 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 Urgency of urination   . Wears glasses     Past Surgical History:  Procedure Laterality Date  . CYSTOSCOPY WITH RETROGRADE PYELOGRAM, URETEROSCOPY AND STENT PLACEMENT Bilateral 08/11/2013   Procedure: CYSTOSCOPY WITH RETROGRADE PYELOGRAM, URETEROSCOPY AND STENT PLACEMENT;  Surgeon: Alexis Frock, MD;  Location: [facility];  Service: Urology;  Laterality: Bilateral;  . CYSTOSCOPY WITH URETEROSCOPY AND STENT PLACEMENT Bilateral 08/29/2013   Procedure: CYSTOSCOPY  WITH BILATERAL URETEROSCOPY AND STENT REPLACEMENTS, stone extraction;  Surgeon: Alexis Frock, MD;  Location: [facility];  Service: Urology;  Laterality: Bilateral;  . CYSTOSCOPY/RETROGRADE/URETEROSCOPY/STONE EXTRACTION WITH BASKET Bilateral 01/31/2016   Procedure: CYSTOSCOPY/BILATERAL RETROGRADE PYELOGRAMS/BILATERAL  URETEROSCOPY/BILATERAL STONE EXTRACTION WITH BASKET/RIGHT URETERAL STENT PLACEMENT;  Surgeon: Raynelle Bring, MD;  Location: [facility];  Service: Urology;  Laterality: Bilateral;  . HEMIARTHROPLASTY SHOULDER FRACTURE Left 1992  . HOLMIUM LASER APPLICATION Bilateral 38/25/0539   Procedure: HOLMIUM LASER APPLICATION;  Surgeon: Alexis Frock, MD;  Location: [facility];  Service: Urology;  Laterality: Bilateral;     reports that she has never smoked. She has never used smokeless tobacco. She reports that she does not drink alcohol and does not use drugs.  Allergies  Allergen Reactions  . Sulfa Antibiotics Rash    Family History  Problem Relation Age of Onset  . Cancer Mother        unknown source, stomach and liver   . Atrial fibrillation Father   . Hyperlipidemia Father   . Hypertension Father   . Colon polyps Father   . Hypertension Sister   . Hyperlipidemia Sister   . Thyroid disease Sister   . Esophageal cancer Neg Hx   . Pancreatic cancer Neg Hx     Prior to Admission medications   Medication Sig Start Date End Date Taking? Authorizing Provider  alendronate (FOSAMAX) 70 MG tablet TAKE 1 TABLET (70 MG TOTAL) BY MOUTH EVERY 7 (SEVEN) DAYS. REPORTED ON 10/30/2015 03/31/20  Yes Gottschalk, Ashly M, DO  atorvastatin (LIPITOR) 20 MG tablet TAKE 1 TABLET BY MOUTH EVERY DAY 08/22/20  Yes Ronnie Doss M, DO  Calcium Carbonate-Vit D-Min (CALCIUM 1200 PO) Take 1 tablet by mouth daily.   Yes [provider]  escitalopram (LEXAPRO) 10 MG tablet TAKE 1 TABLET BY MOUTH EVERY DAY 08/11/20  Yes Hassell Done, Mary-Margaret, FNP  ferrous sulfate 325 (65 FE) MG tablet Take 325 mg by mouth daily with breakfast.   Yes [provider]  Glycerin-Hypromellose-PEG 400 0.2-0.2-1 % SOLN Apply 1 drop to eye 2 (two) times daily as needed (dry eyes).    Yes [provider]  ibuprofen (ADVIL,MOTRIN) 200 MG tablet Take 200-400 mg by mouth every 6 (six) hours as needed for  moderate pain.   Yes [provider]  Multiple Vitamin (MULTIVITAMIN WITH MINERALS) TABS tablet Take 1 tablet by mouth daily.   Yes [provider]  loratadine (CLARITIN) 10 MG tablet Take 10 mg by mouth daily as needed for allergies.    [provider]    Physical Exam: Vitals:   09/04/20 1800 09/04/20 1900 09/04/20 2000 09/04/20 2100  BP: (!) 227/78 (!) 179/81 (!) 178/78 (!) 157/74  Pulse: 81 92 80 78  Resp: 19 (!) 22 15 15   Temp:   98.1 F (36.7 C) 98 F (36.7 C)  TempSrc:   Oral Oral  SpO2: 92% 96% 97% 99%    Constitutional: NAD, calm, comfortable Vitals:   09/04/20 1800 09/04/20 1900 09/04/20 2000 09/04/20 2100  BP: (!) 227/78 (!) 179/81 (!) 178/78 (!) 157/74  Pulse: 81 92 80 78  Resp: 19 (!) 22 15 15   Temp:   98.1 F (36.7 C) 98 F (36.7 C)  TempSrc:   Oral Oral  SpO2: 92% 96% 97% 99%   Eyes: PERRL, lids and conjunctivae normal ENMT: Mucous membranes are moist. n.  Neck: normal, supple, no masses, no thyromegaly Respiratory: clear to auscultation bilaterally, no wheezing, no crackles. Normal  respiratory effort. No accessory muscle use.  Cardiovascular: Regular rate and rhythm, no murmurs / rubs / gallops. No extremity edema. 2+ pedal pulses.  Fungating mass on left side of chest, with drainage, foul smell.  Measuring at least 6 cm in diameter. Abdomen: no tenderness, no masses palpated. No hepatosplenomegaly. Bowel sounds positive.  No CVA tenderness Musculoskeletal: no clubbing / cyanosis. No joint deformity upper and lower extremities. Good ROM, no contractures. Normal muscle tone.  Skin: no rashes, No induration, skin mass on chest Neurologic: Neurological:     Mental Status: She is alert.     GCS: GCS eye subscore is 4. GCS verbal subscore is 5. GCS motor subscore is 6.     Comments: Mental Status:  Alert, oriented, thought content appropriate, able to give a coherent history. Speech fluent without evidence of aphasia. Able to follow 2  step commands without difficulty.  Cranial Nerves:  II:   pupils equal, round, reactive to light III,IV, VI: ptosis not present, extra-ocular motions intact bilaterally  V,VII: smile symmetric, eyebrows raise symmetric, facial light touch sensation equal VIII: hearing grossly normal to voice  X: Not tested  XII: midline tongue extension without fassiculations Motor:   4+/5 strenght in all extremities bilaterally.  At rest bilateral feet mildly dorsiflexed. Sensory: Sensation intact to light touch in all extremities.  Cerebellar: Not tested.  CV: distal pulses palpable throughout   Mild resting tremor to left upper extremity and intermittent tremors in bilateral lower extremities. Psychiatric: Normal judgment and insight. Alert and oriented x person and place.  She does not appear confused at this time and is able to give a coherent history normal mood.  Appropriate affect.      Labs on Admission: I have personally reviewed following labs and imaging studies  CBC: Recent Labs  Lab 09/04/20 1545  WBC 11.5*  HGB 9.4*  HCT 33.1*  MCV 83.0  PLT 115   Basic Metabolic Panel: Recent Labs  Lab 09/04/20 1545  NA 140  140  K 3.7  3.8  CL 104  104  CO2 25  25  GLUCOSE 126*  127*  BUN 19  18  CREATININE 1.23*  1.24*  CALCIUM 8.9  8.7*   Liver Function Tests: Recent Labs  Lab 09/04/20 1545  AST 21  ALT 14  ALKPHOS 121  BILITOT 0.7  PROT 7.7  ALBUMIN 3.5   Coagulation Profile: Recent Labs  Lab 09/04/20 1545  INR 1.0   Urine analysis:    Component Value Date/Time   COLORURINE YELLOW 09/04/2020 1659   APPEARANCEUR HAZY (A) 09/04/2020 1659   LABSPEC 1.016 09/04/2020 1659   PHURINE 6.0 09/04/2020 1659   GLUCOSEU NEGATIVE 09/04/2020 1659   HGBUR LARGE (A) 09/04/2020 1659   BILIRUBINUR NEGATIVE 09/04/2020 1659   KETONESUR 20 (A) 09/04/2020 1659   PROTEINUR 100 (A) 09/04/2020 1659   UROBILINOGEN 0.2 07/04/2014 0406   NITRITE NEGATIVE 09/04/2020 1659    LEUKOCYTESUR MODERATE (A) 09/04/2020 1659    Radiological Exams on Admission: CT Head W or Wo Contrast  Result Date: 09/04/2020 CLINICAL DATA:  Encephalopathy EXAM: CT HEAD WITHOUT AND WITH CONTRAST TECHNIQUE: Contiguous axial images were obtained from the base of the skull through the vertex without and with intravenous contrast CONTRAST:  47mL OMNIPAQUE IOHEXOL 300 MG/ML  SOLN COMPARISON:  None. FINDINGS: Brain: There is a homogeneously enhancing extra-axial mass of the anterior cranial fossa measuring 3.5 x 2.2 cm. There is a large amount of vasogenic edema in the overlying  right frontal lobe. There is leftward midline shift with subfalcine herniation of the right cingulate gyrus. There is no intracranial hemorrhage or other extra-axial collection. The right lateral ventricle is effaced in the left is mildly dilated. Vascular: No hyperdense vessel or unexpected calcification. Visible vessels are patent. Skull: Normal. Negative for fracture or focal lesion. Sinuses/Orbits: No acute finding. Other: None. 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 There is leftward midline shift with subfalcine herniation of the right cingulate gyrus with early entrapment of the left lateral ventricle. Critical Value/emergent results were called by telephone at the time of interpretation on 09/04/2020 at 8:44 pm to provider GARRETT GREEN , who verbally acknowledged these results. Electronically Signed   By: Ulyses Jarred M.D.   On: 09/04/2020 20:46   CT Renal Stone Study  Result Date: 09/04/2020 CLINICAL DATA:  Hematuria EXAM: CT ABDOMEN AND PELVIS WITHOUT CONTRAST TECHNIQUE: Multidetector CT imaging of the abdomen and pelvis was performed following the standard protocol without IV contrast. COMPARISON:  CT 01/27/2019 FINDINGS: Lower chest: Lung bases demonstrate mild dependent atelectasis. Normal cardiac size. Hepatobiliary: Multiple calcified gallstones.  No biliary dilatation. Stable probable hepatic cyst in the left hepatic lobe Pancreas: Unremarkable. No pancreatic ductal dilatation or surrounding inflammatory changes. Spleen: Normal in size without focal abnormality. Adrenals/Urinary Tract: Adrenal glands are normal. Cyst off the lower pole left kidney. 3 mm stone in the midpole of the left kidney. 2 mm stone in the mid right kidney. Mild right renal pelvis dilatation without significant hydroureter, there is a 3 mm stone in the mid to distal right ureter at the level of the upper sacrum. The urinary bladder is unremarkable. Stomach/Bowel: The stomach is nonenlarged. No dilated small bowel. Negative appendix. Possible mild wall thickening of the distal descending/proximal sigmoid colon. Scattered left colon diverticula Vascular/Lymphatic: Nonaneurysmal aorta.  No suspicious nodes Reproductive: Probable small calcified uterine fibroid. No adnexal mass Other: Negative for free air or free fluid Musculoskeletal: No acute or significant osseous findings. IMPRESSION: 1. Mild right renal pelvis dilatation without significant hydroureter, secondary to a 3 mm stone in the mid to distal right ureter at the level of the upper sacrum. 2. Small intrarenal stones bilaterally. 3. Possible mild wall thickening of the distal descending/proximal sigmoid colon, could reflect a mild colitis of infectious, inflammatory, or ischemic etiology. 4. Gallstones. Electronically Signed   By: Donavan Foil M.D.   On: 09/04/2020 19:16    EKG: Independently reviewed.  Sinus rhythm, QTC 479.  No significant ST-T wave abnormalities.  No old EKG to compare.  Assessment/Plan Principal Problem:   Meningioma (HCC) Active Problems:   AMS (altered mental status)   Vasogenic Cerebral edema (HCC)   Generalized weakness   Cancer of skin, squamous cell   Nephrolithiasis   UTI (urinary tract infection)  Meningioma with severe vasogenic edema-worsening headaches, intermittent confusion,  generalized weakness, vomiting. Head CT- Anterior cranial fossa meningioma causing severe vasogenic edema within the right frontal lobe. There is leftward midline shift with subfalcine herniation of the right cingulate gyrus with early entrapment of the left lateral ventricle. - EDP  Talked to neurosurgeon on call- Dr. Reatha Armour, Admit to [facility] - Decadron 10mg  given , continue 4mg  q6h. -Remain n.p.o. -Seizure precautions -Will talk to neurosurgeon if ED to ED transfer is appropriate considering CT findings, as it is very likely patient will not get a bed by morning.  Squamous cell carcinoma of the chest- > 1 month.  Patient has not  sought care for this. -Will need oncology consultation at [facility]  Nephrolithiasis-history of kidney stones, renal stone study shows 3 mm stone in the mid to distal right ureter at the level of the upper sacrum, no significant hydroureter. -ED provider talked to urologist on-call at [facility] Dr. Caprice Beaver, continue antibiotics. - N/s 100cc/hr x 20hrs.  Probable UTI-reports urinary incontinence and frequency.  No dysuria.  Also for sepsis.  WBC 11.5.  No CVA tenderness.  Doubt pyelonephritis. -Continue IV Rocephin 1 g daily -Follow up Urine cultures  Chronic iron deficiency anemia-hemoglobin 7.4 improved from 7.52 months ago.  Improved with iron supplementation.   DVT prophylaxis: SCDs Code Status: DNI-confirmed with patient and daughter at bedside. Family Communication: Daughter at bedside bedside who will be primary Marine scientist. Disposition Plan: > 2 days Consults called: Neurosurgery. Admission status: Inpt, Step down I certify that at the point of admission it is my clinical judgment that the patient will require inpatient hospital care spanning beyond 2 midnights from the point of admission due to high intensity of service, high risk for further deterioration and high frequency of surveillance required. The following factors support the patient status of  inpatient:    Bethena Roys MD [facility]  09/04/2020, 10:40 PM

## 2020-09-04 NOTE — ED Notes (Signed)
Patient placed on pure wick °

## 2020-09-04 NOTE — ED Notes (Signed)
Patient given a food tray per request.

## 2020-09-05 ENCOUNTER — Encounter (HOSPITAL_COMMUNITY): Payer: Self-pay | Admitting: Internal Medicine

## 2020-09-05 ENCOUNTER — Encounter: Payer: Medicare Other | Admitting: Internal Medicine

## 2020-09-05 ENCOUNTER — Inpatient Hospital Stay (HOSPITAL_COMMUNITY): Payer: Medicare Other

## 2020-09-05 ENCOUNTER — Ambulatory Visit: Payer: BLUE CROSS/BLUE SHIELD | Admitting: Gastroenterology

## 2020-09-05 LAB — BASIC METABOLIC PANEL
Anion gap: 13 (ref 5–15)
BUN: 17 mg/dL (ref 8–23)
CO2: 24 mmol/L (ref 22–32)
Calcium: 8.5 mg/dL — ABNORMAL LOW (ref 8.9–10.3)
Chloride: 104 mmol/L (ref 98–111)
Creatinine, Ser: 1.3 mg/dL — ABNORMAL HIGH (ref 0.44–1.00)
GFR, Estimated: 44 mL/min — ABNORMAL LOW (ref >60)
Glucose, Bld: 128 mg/dL — ABNORMAL HIGH (ref 70–99)
Potassium: 4 mmol/L (ref 3.5–5.1)
Sodium: 141 mmol/L (ref 135–145)

## 2020-09-05 LAB — CBC
HCT: 31.6 % — ABNORMAL LOW (ref 36.0–46.0)
Hemoglobin: 9 g/dL — ABNORMAL LOW (ref 12.0–15.0)
MCH: 23.6 pg — ABNORMAL LOW (ref 26.0–34.0)
MCHC: 28.5 g/dL — ABNORMAL LOW (ref 30.0–36.0)
MCV: 82.7 fL (ref 80.0–100.0)
Platelets: 225 10*3/uL (ref 150–400)
RBC: 3.82 MIL/uL — ABNORMAL LOW (ref 3.87–5.11)
RDW: 23.9 % — ABNORMAL HIGH (ref 11.5–15.5)
WBC: 6.6 10*3/uL (ref 4.0–10.5)
nRBC: 0 % (ref 0.0–0.2)

## 2020-09-05 IMAGING — CT CT CHEST-ABD-PELV W/ CM
2 of 5 series · 12 of 36 positions shown, 14 images · IV contrast (APPLIED)
Comparison: Multiple prior CT abdomen and pelvis, most recently
from yesterday.

CLINICAL DATA: Brain meningioma.

EXAM:
CT CHEST, ABDOMEN, AND PELVIS WITH CONTRAST
TECHNIQUE: Multidetector CT imaging of the chest, abdomen and pelvis was
performed following the standard protocol during bolus
administration of intravenous contrast.
CONTRAST:  80mL OMNIPAQUE IOHEXOL 350 MG/ML SOLN

[Series 3: cap 5.0 i31f 2 · axial · 0.68mm/px · z∈[+796,+1266]mm · 9 of 118 slices shown, 11 images]
[im 12/118  mediastinal]
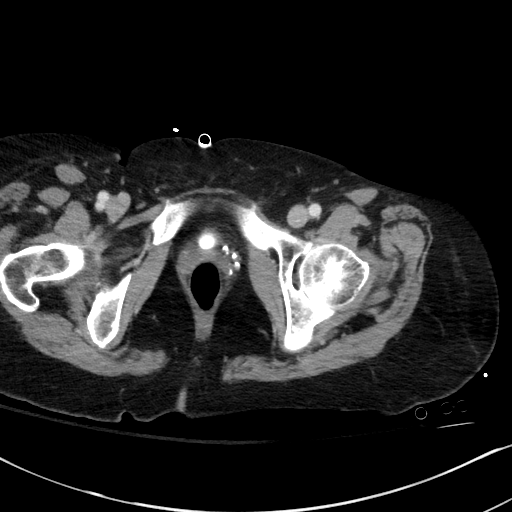
[im 12/118  bone]
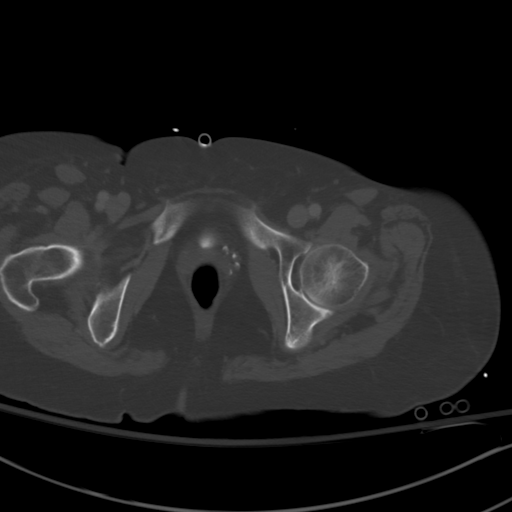
[im 24/118  mediastinal]
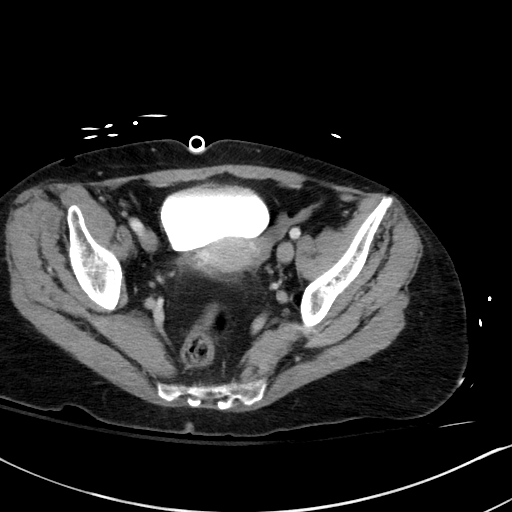
[im 36/118  mediastinal]
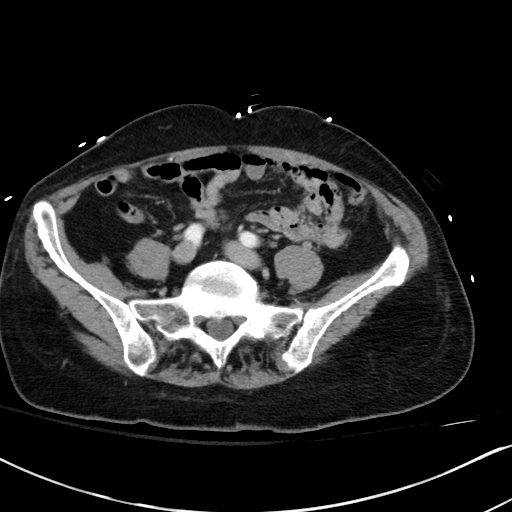
[im 47/118  mediastinal]
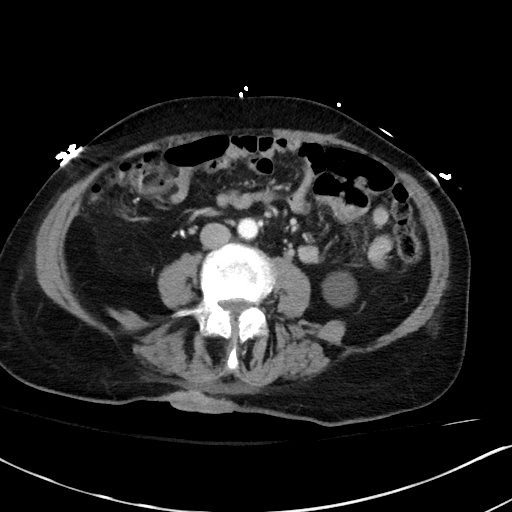
[im 59/118  mediastinal]
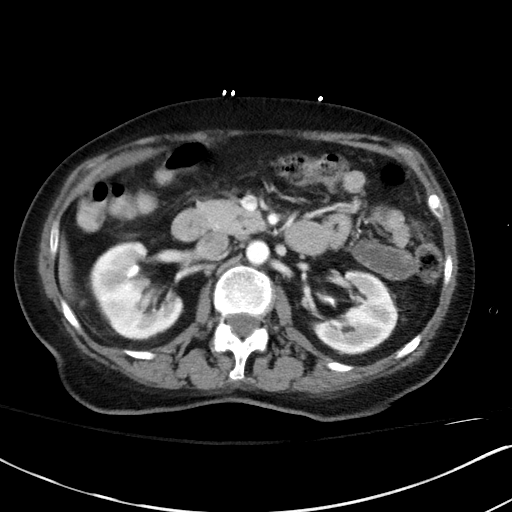
[im 71/118  mediastinal]
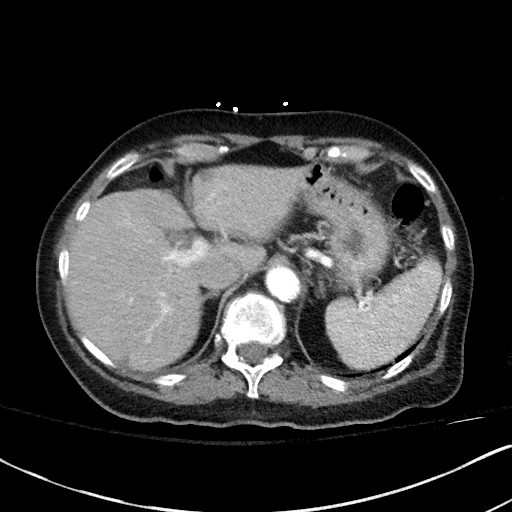
[im 82/118  mediastinal]
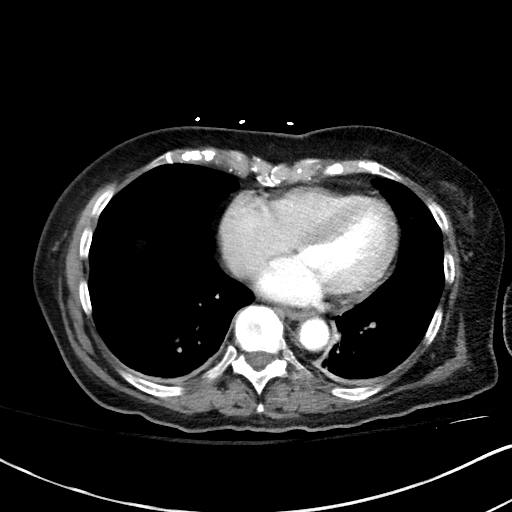
[im 94/118  mediastinal]
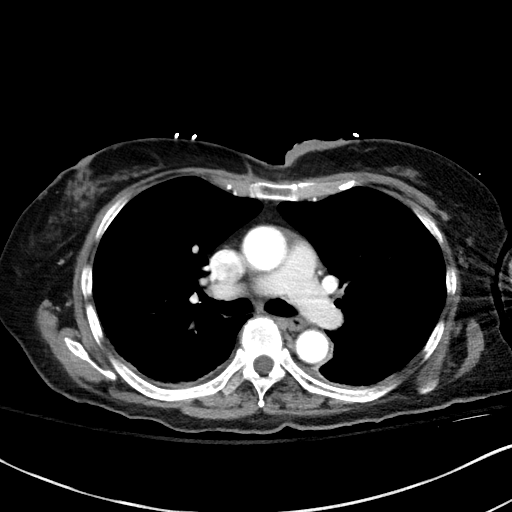
[im 106/118  mediastinal]
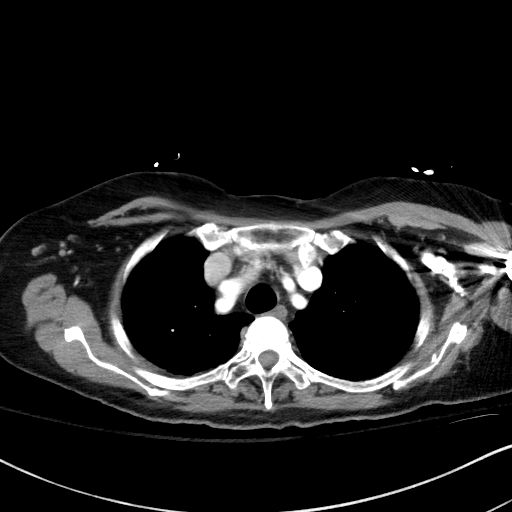
[im 106/118  bone]
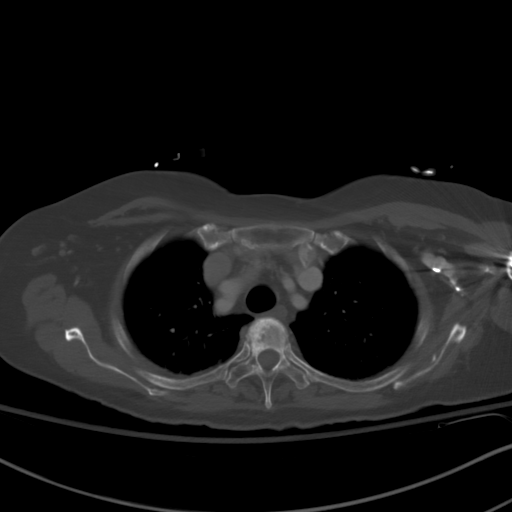

[Series 6: coronal · coronal · 0.71mm/px · 3 of 151 slices shown]
[im 31/151  mediastinal]
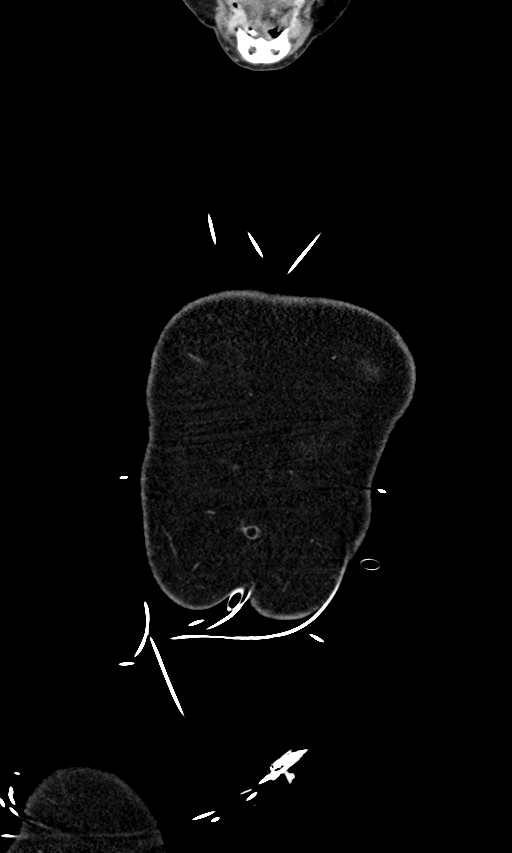
[im 61/151  mediastinal]
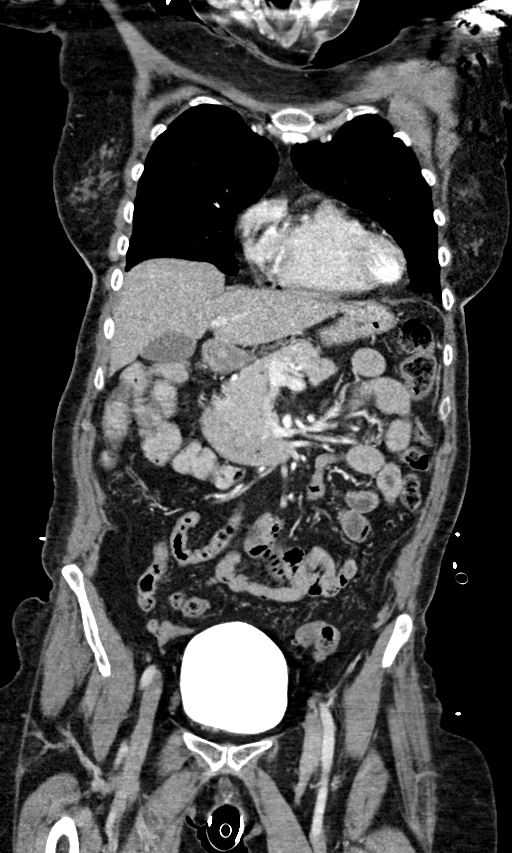
[im 91/151  mediastinal]
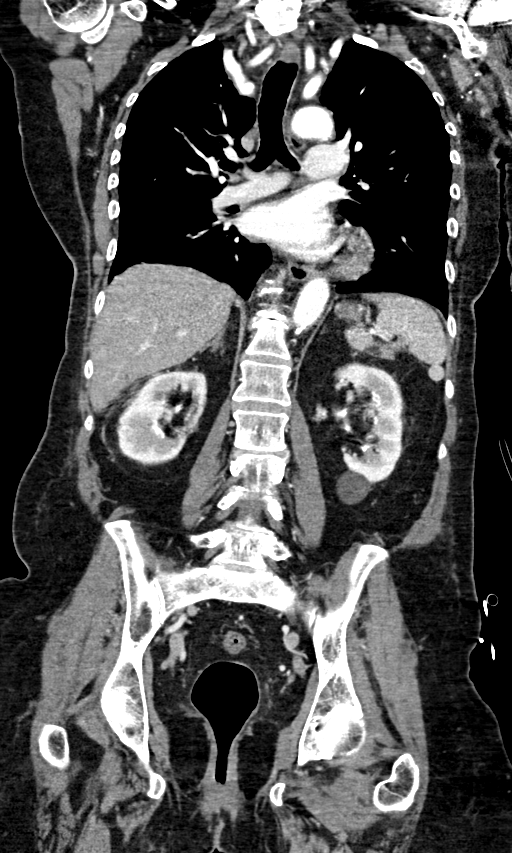

[12 of 36 positions shown; findings below may reference images not displayed]

FINDINGS: CT CHEST FINDINGS

Cardiovascular: No significant vascular findings. Normal heart size.
No pericardial effusion. No thoracic aortic aneurysm or dissection.
No central pulmonary embolism.

Mediastinum/Nodes: No enlarged mediastinal, hilar, or axillary lymph
nodes. 1.6 cm heterogeneously hypodense right thyroid nodule. The
trachea and esophagus demonstrate no significant findings.

Lungs/Pleura: Minimal dependent subsegmental atelectasis in both
lungs. No focal consolidation, pleural effusion, or pneumothorax. No
suspicious pulmonary nodule.

Musculoskeletal: No acute or significant osseous findings.

CT ABDOMEN PELVIS FINDINGS

Hepatobiliary: Unchanged scattered small hepatic cysts. Unchanged
small gallstones. No gallbladder wall thickening or biliary
dilatation.

Pancreas: Unremarkable. No pancreatic ductal dilatation or
surrounding inflammatory changes.

Spleen: Normal in size without focal abnormality.

Adrenals/Urinary Tract: The adrenal glands are unremarkable.
Unchanged simple cyst in the lower pole of the left kidney. Other
subcentimeter low-density lesions in both kidneys are too small to
characterize. Previously noted small bilateral renal calculi are
obscured by excreted contrast. Unchanged 3 mm calculus in the mid to
distal right ureter. Excreted contrast within the bladder.

Stomach/Bowel: Stomach is within normal limits. Appendix appears
normal. No evidence of bowel wall thickening, distention, or
inflammatory changes. Left-sided colonic diverticulosis.

Vascular/Lymphatic: No significant vascular findings are present. No
enlarged abdominal or pelvic lymph nodes.

Reproductive: Tiny calcified uterine fibroid.  No adnexal mass.

Other: Unchanged tiny fat containing umbilical hernia. No free fluid
or pneumoperitoneum.

Musculoskeletal: No acute or significant osseous findings.
IMPRESSION: 1. No suspicious mass lesion or lymphadenopathy in the chest,
abdomen, or pelvis.
2. Unchanged 3 mm calculus in the mid to distal right ureter.
3. Previously noted small bilateral renal calculi are obscured by
excreted contrast.
4. 1.6 cm heterogeneously hypodense right thyroid nodule. Recommend
thyroid US (ref: [HOSPITAL]. [DATE]): 143-50).
5. Unchanged cholelithiasis.

## 2020-09-05 IMAGING — MR MR HEAD WO/W CM
13 of 14 series · 29 of 48 positions shown · IV contrast (gadavist)
Comparison: Head CT yesterday.

CLINICAL DATA: Encephalopathy.  Meningioma shown by CT.

EXAM:
MRI HEAD WITHOUT AND WITH CONTRAST
TECHNIQUE: Multiplanar, multiecho pulse sequences of the brain and surrounding
structures were obtained without and with intravenous contrast.
CONTRAST:  6.5mL GADAVIST GADOBUTROL 1 MMOL/ML IV SOLN

[Series 2: FLAIR · sagittal · 3.0mm · 0.47mm/px · 1 of 47 slices shown (1 of 2)]
[im 1/47]
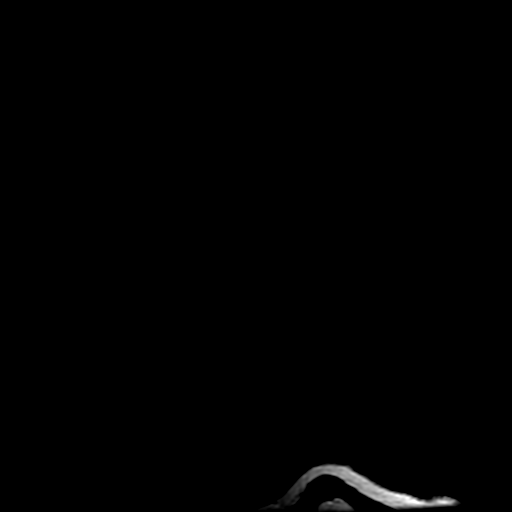

[Series 3: T2 · axial · 5.0mm · 0.43mm/px · 1 of 35 slices shown]
[im 1/35]
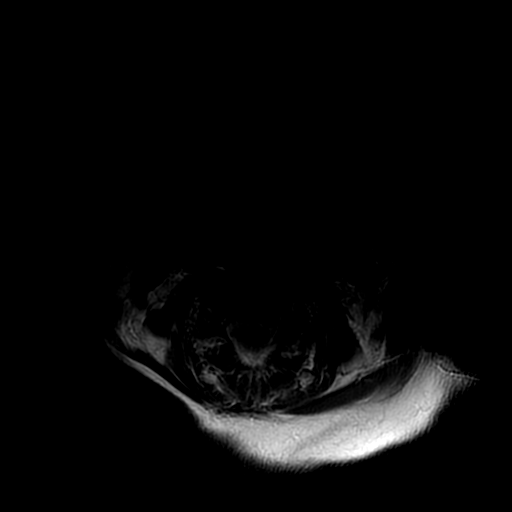

[Series 4: ax dti · axial · 3.0mm · 0.94mm/px · z∈[-102,+81]mm · 8 of 1767 slices shown]
[im 85/1767]
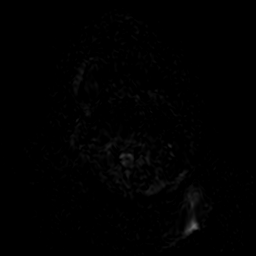
[im 337/1767]
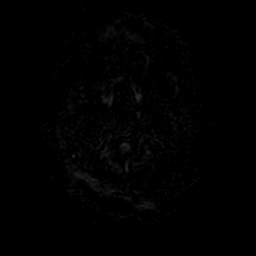
[im 505/1767]
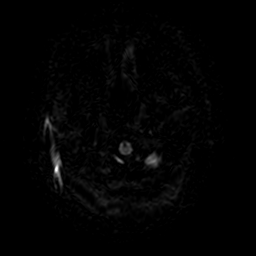
[im 757/1767]
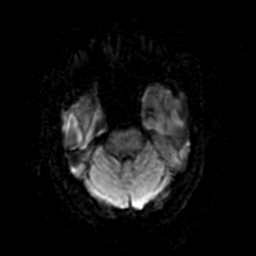
[im 1010/1767]
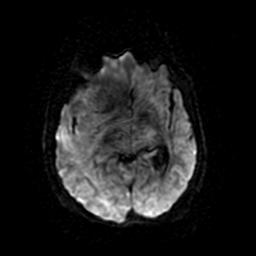
[im 1262/1767]
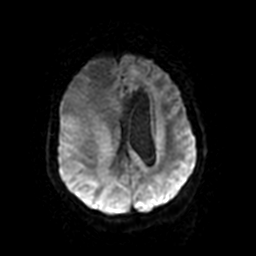
[im 1430/1767]
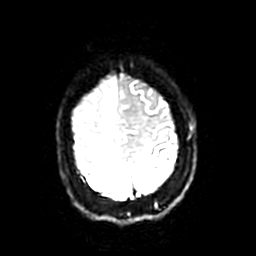
[im 1682/1767]
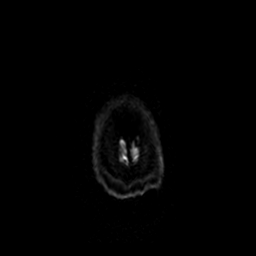

[Series 6: (person_name) · axial · 3.0mm · 0.47mm/px · z∈[-121,+94]mm · 2 of 144 slices shown]
[im 1/144]
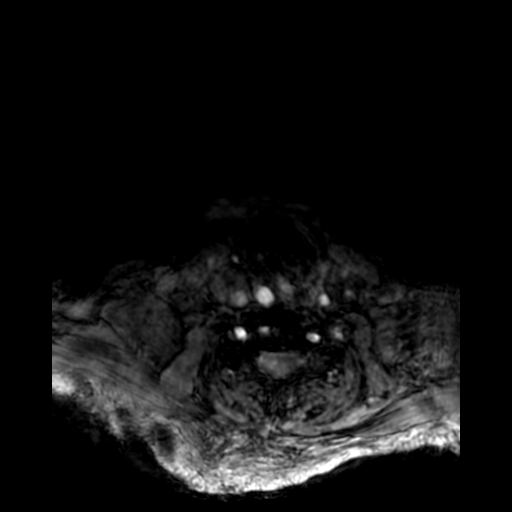
[im 144/144]
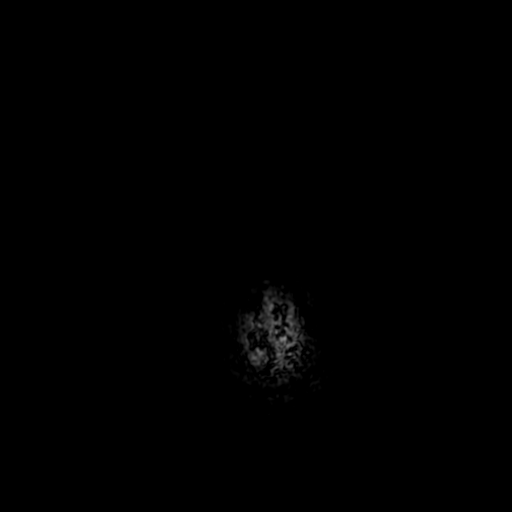

[Series 7: ax 3(person_name) · axial · 1.0mm · 1.02mm/px · z∈[-110,+95]mm · 3 of 205 slices shown (1 of 2)]
[im 1/205]
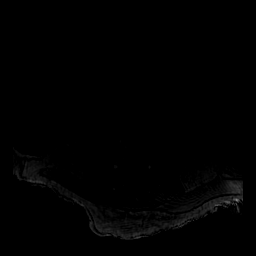
[im 103/205]
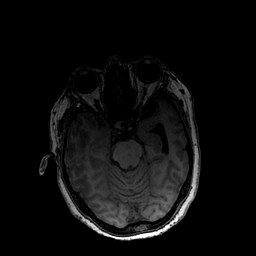
[im 205/205]
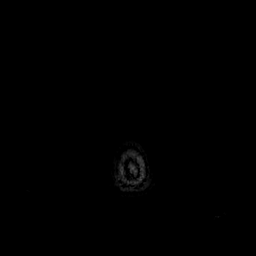

[Series 8: T2 post-contrast · coronal · 3.0mm · 0.39mm/px · 1 of 72 slices shown]
[im 1/72]
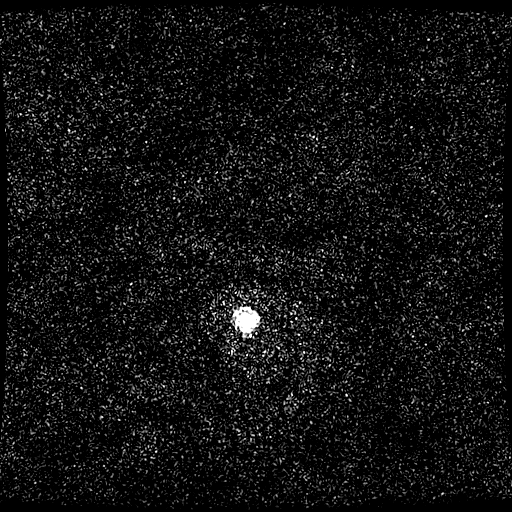

[Series 9: ax 3(person_name) · axial · 1.0mm · 1.02mm/px · z∈[-110,+95]mm · 3 of 206 slices shown (2 of 2)]
[im 1/206]
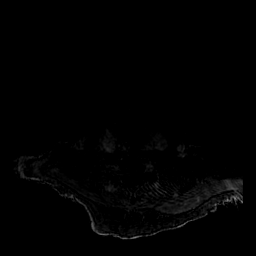
[im 103/206]
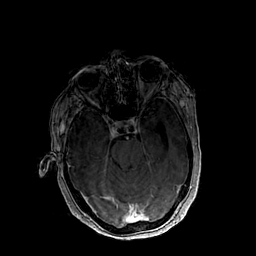
[im 206/206]
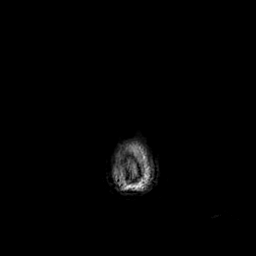

[Series 10: T1 · coronal · 3.0mm · 0.39mm/px · 1 of 72 slices shown]
[im 1/72]
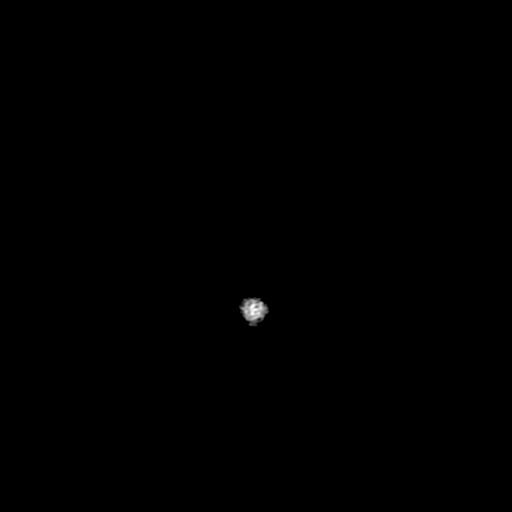

[Series 11: FLAIR · sagittal · 3.0mm · 0.47mm/px · 1 of 47 slices shown (2 of 2)]
[im 1/47]
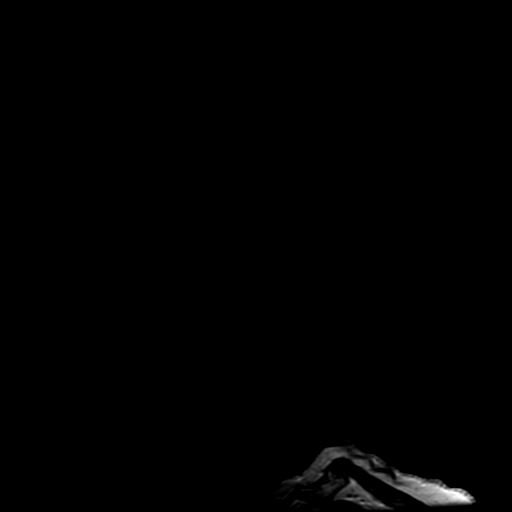

[Series 450: trace · axial · 3.0mm · 0.94mm/px · 1 of 67 slices shown]
[im 1/67]
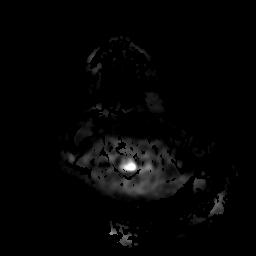

[Series 500: multiplanar reconstruction (mpr) · axial · 1.0mm · 0.50mm/px · z∈[-153,+103]mm · 3 of 257 slices shown (1 of 2)]
[im 1/257]
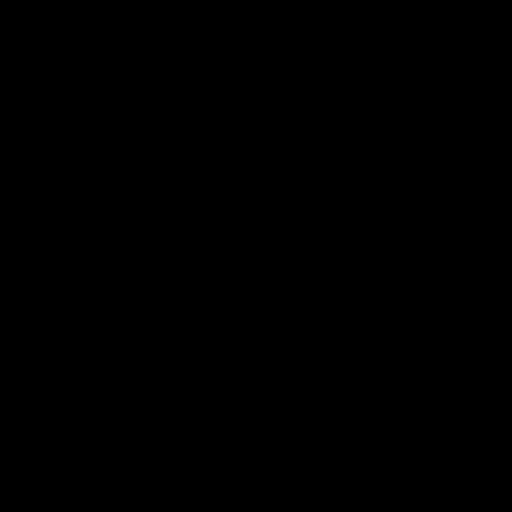
[im 129/257]
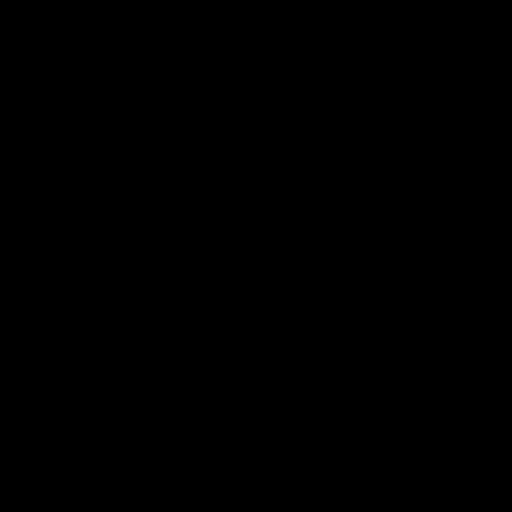
[im 257/257]
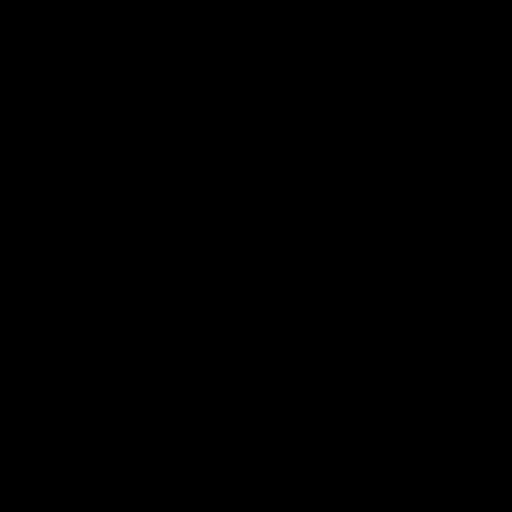

[Series 501: multiplanar reconstruction (mpr) · coronal · 1.0mm · 0.50mm/px · 3 of 243 slices shown (2 of 2)]
[im 1/243]
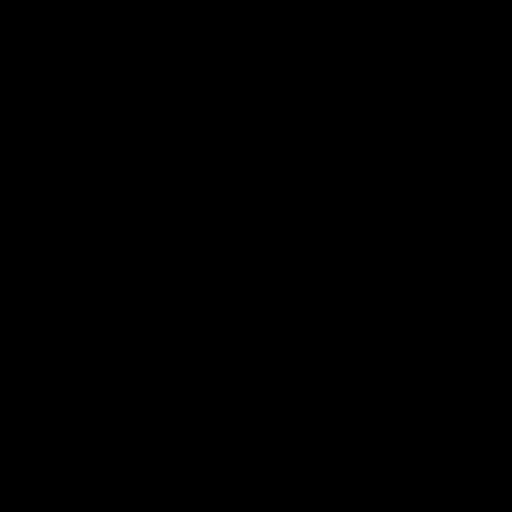
[im 122/243]
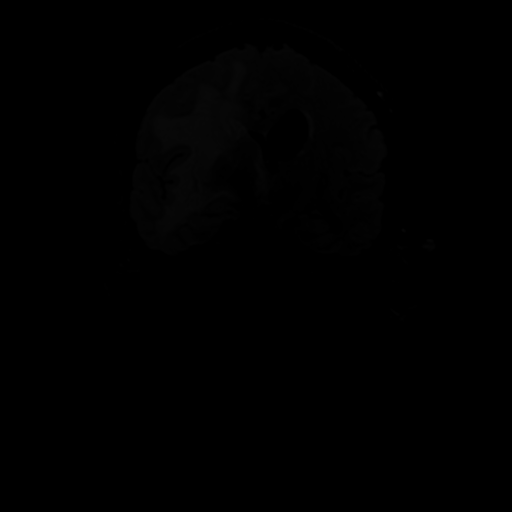
[im 243/243]
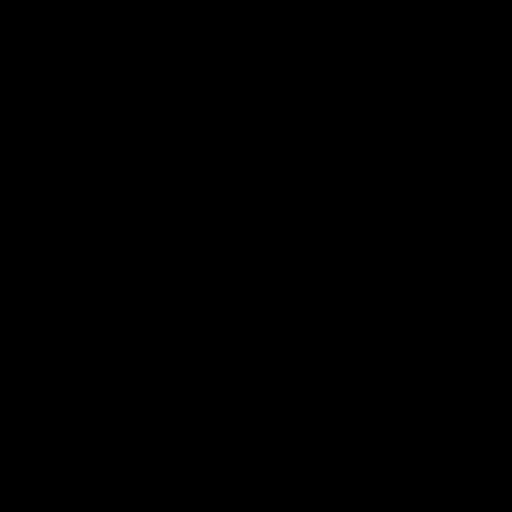

[Series 600: filt_pha: (person_name) · axial · 3.0mm · 0.47mm/px · 1 of 144 slices shown]
[im 1/144]
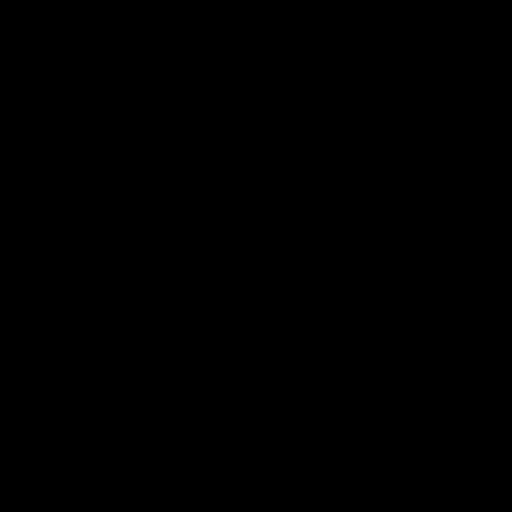

[29 of 48 positions shown; findings below may reference images not displayed]

FINDINGS: Brain: Diffusion imaging does not show any acute or subacute
infarction. There is a large meningioma at the skull base on the
right with the epicenter at the dorsal planum sphenoidale. This
measures 3.3 cm front to back, 4.1 cm right to left and 2.4 cm
cephalo caudal. Mass-effect upon the inferior right frontal lobe.
Pronounced regional vasogenic edema affecting the right cerebral
hemisphere. Mass effect with right-to-left shift of 2 cm. Question
some degree of trapping of the left lateral ventricle. Mild
background pattern of chronic small vessel change of the white
matter seen otherwise. No hemorrhage. No extra-axial fluid
collection.

Vascular: Major vessels at the base of the brain show flow.

Skull and upper cervical spine: Negative

Sinuses/Orbits: Clear/normal

Other: None
IMPRESSION: 3.3 x 4.1 x 2.4 cm meningioma at the skull base on the right with
the epicenter at the dorsal planum sphenoidale. Mass-effect upon the
inferior right frontal lobe. Pronounced regional vasogenic edema
affecting the right cerebral hemisphere. Mass effect with
right-to-left shift of 2 cm. Question some degree of trapping of the
left lateral ventricle. No apparent change when compared to
yesterday's CT.

## 2020-09-05 MED ORDER — IOHEXOL 350 MG/ML SOLN
80.0000 mL | Freq: Once | INTRAVENOUS | Status: AC | PRN
  Administered 2020-09-05: 12:00:00 80 mL via INTRAVENOUS

## 2020-09-05 MED ORDER — GADOBUTROL 1 MMOL/ML IV SOLN
6.5000 mL | Freq: Once | INTRAVENOUS | Status: AC | PRN
  Administered 2020-09-05: 6.5 mL via INTRAVENOUS

## 2020-09-05 MED ORDER — LEVETIRACETAM 500 MG PO TABS
500.0000 mg | ORAL_TABLET | Freq: Two times a day (BID) | ORAL | Status: DC
  Administered 2020-09-05 – 2020-09-17 (×24): 500 mg via ORAL
  Filled 2020-09-05 (×24): qty 1

## 2020-09-05 NOTE — ED Notes (Signed)
Patient transported to CT 

## 2020-09-05 NOTE — ED Notes (Signed)
Patient transported to MRI 

## 2020-09-05 NOTE — Consult Note (Signed)
   Providing Compassionate, Quality Care - Together  Neurosurgery Consult  Referring physician: Dr. 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  She had difficulty getting into the car therefore she called 911 and was brought to [facility].  Due to her complaint of not being herself, CT of the brain was obtained which revealed a right anterior cranial fossa mass with significant vasogenic edema and mass-effect.  At this time the patient has no complaints whatsoever.  She is a poor historian.  Her daughter is at bedside and is able to provide a better history.  She states she has been somewhat confused over the past few months and generalized weak, was supposed to get a GI work-up today as an outpatient for her anemia.  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  The patient denies any vision changes, denies any changes in smell.  Does complain of intermittent headaches.  Denies any focal weakness, numbness or tingling.   Medications: I have reviewed the patient's current medications. Allergies: No Known Allergies  History reviewed. No pertinent family history. Social History:  has no history on file for tobacco use, alcohol use, and drug use.  ROS: All pertinent positives and negatives are listed in HPI above  Physical Exam:  Vital signs in last 24 hours: Temp:  [98 F (36.7 C)-98.3 F (36.8 C)] 98 F (36.7 C) (07/25 1814) Pulse Rate:  [58-128] 65 (07/26 0746) Resp:  [11-18] 14 (07/26 0217) BP: (138-182)/(65-125) 153/88 (07/26 0700) SpO2:  [91 %-98 %] 96  % (07/26 0746) PE: Awake alert, disoriented PERRLA EOMI Cranial nerves II through XII intact Sensory intact to light touch throughout Bilateral upper/lower extremity strength full and symmetric No drift Visual fields appear full to finger counting   Impression/Assessment:  69 year old female with  1.  Right anterior cranial fossa mass, concerning for meningioma vs metastasis with significant vasogenic edema and midline shift and herniation 2.  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  We will discuss further with them once work-up is complete. -Continue Decadron 4 every 6 -Keppra 500 mg twice daily   Thank you for allowing me to participate in this patient's care.  Please do not hesitate to call with questions or concerns.   [name] Page, [facility] Cell: [phone]

## 2020-09-05 NOTE — Progress Notes (Addendum)
Progress Note    [name] Page  FHL:[id number] DOB: [birth year]  DOA: 09/04/2020 PCP: Janora Norlander, DO      Brief Narrative:    Medical records reviewed and are as summarized below:  [name] Page is a 69 y.o. 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  Apparently, she had been scheduled for EGD and colonoscopy on 09/05/2020.  Her daughter reported patient confused, forgetful and has been complaining of headache recently.  Apparently, patient has had a headache for over a year but headache had worsened recently.  She had also had few episodes of vomiting.  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    Assessment/Plan:   Principal Problem:   Meningioma (HCC) Active Problems:   AMS (altered mental status)   Vasogenic Cerebral edema (HCC)   Generalized weakness   Cancer of skin, squamous cell   Nephrolithiasis   UTI (urinary tract infection)   There is no height or weight on file to calculate BMI.     Meningioma with severe vasogenic cerebral edema: Continue IV dexamethasone.  Continue Keppra for seizure prophylaxis.  CT abdomen pelvis did not show any evidence of malignancy.  Patient has been evaluated by Dr. Reatha Armour, neurosurgeon, with plan for neurosurgery in the next few days.  Squamous cell carcinoma vs basal cell carcinoma of the skin of the chest, right breast and lower back: Her daughter said she had seen Dr. Milinda Antis many many years ago for this.  However, patient was lost to follow-up and did not go back to see Dr. Milinda Antis.  Her daughter said she was not aware that lesions had grown in size.  Since there is no dermatologist on service, patient will follow up with dermatologist as an outpatient at discharge.  Probable acute complicated UTI, bilateral  nephrolithiasis, nonobstructing right urolithiasis, mild right renal pelvis dilatation without significant hydroureter: Continue empiric IV antibiotics.   Black stools, iron deficiency anemia: H&H is stable.  Initially there was concern for colitis on CT abdomen and pelvis without contrast.  However repeat CT abdomen and pelvis with contrast did not show any evidence of colitis.  Outpatient follow-up with gastroenterologist once meningioma has been addressed.  Case was discussed with Dr. Reatha Armour, neurosurgeon, at the bedside.  Plan of care was discussed with patient and her daughter, [name] Page, at the bedside.   Diet Order            Diet Heart Room service appropriate? Yes; Fluid consistency: Thin  Diet effective now                    Consultants:  Neurosurgeon  Procedures:  None    Medications:   . dexamethasone (DECADRON) injection  4 mg Intravenous Q6H  . levETIRAcetam  500 mg Oral BID   Continuous Infusions: . sodium chloride 100 mL/hr at 09/05/20 0918  . cefTRIAXone (ROCEPHIN)  IV       Anti-infectives (From admission, onward)   Start     Dose/Rate Route Frequency Ordered Stop   09/05/20 1800  cefTRIAXone (ROCEPHIN) 1 g in sodium chloride 0.9 % 100 mL IVPB        1 g 200 mL/hr over 30 Minutes Intravenous Every 24 hours 09/04/20 2221     09/04/20 1845  cefTRIAXone (ROCEPHIN) 1 g in sodium chloride 0.9 % 100 mL  IVPB        1 g 200 mL/hr over 30 Minutes Intravenous  Once 09/04/20 1842 09/04/20 2001             Family Communication/Anticipated D/C date and plan/Code Status   DVT prophylaxis: SCDs Start: 09/04/20 2222     Code Status: Partial Code  Family Communication: Plan discussed with her daughter, [name] Page, at the bedside Disposition Plan:    Status is: Inpatient  Remains inpatient appropriate because:IV treatments appropriate due to intensity of illness or inability to take PO and Inpatient level of care appropriate due to severity of  illness   Dispo: The patient is from: Home              Anticipated d/c is to: Home              Anticipated d/c date is: > 3 days              Patient currently is not medically stable to d/c.           Subjective:   No complaints.  No headache, dizziness, vomiting.  She denies any abdominal pain or black stools even though chart review showed that she had complained of rectal bleeding.  Her daughter, [name] Page, is at the bedside.    Objective:    Vitals:   09/05/20 0738 09/05/20 0800 09/05/20 0925 09/05/20 1400  BP:  (!) 165/81 (!) 152/87 (!) 142/81  Pulse:  64 81 82  Resp:  19 (!) 21 (!) 21  Temp: 98.4 F (36.9 C)     TempSrc: Oral     SpO2:  94% 96% 96%   No data found.   Intake/Output Summary (Last 24 hours) at 09/05/2020 1533 Last data filed at 09/04/2020 2001 Gross per 24 hour  Intake 100 ml  Output --  Net 100 ml   There were no vitals filed for this visit.  Exam:  GEN: NAD SKIN: Warm and dry.  Lesion suspected to be small cell cancer on the mid lower back, right breast and sternal area close to the left breast. EYES: EOMI, PERRLA ENT: MMM CV: RRR PULM: CTA B ABD: soft, ND, NT, +BS CNS: AAO x 3, non focal EXT: No edema or tenderness               Data Reviewed:   I have personally reviewed following labs and imaging studies:  Labs: Labs show the following:   Basic Metabolic Panel: Recent Labs  Lab 09/04/20 1545 09/05/20 0211  NA 140  140 141  K 3.7  3.8 4.0  CL 104  104 104  CO2 25  25 24   GLUCOSE 126*  127* 128*  BUN 19  18 17   CREATININE 1.23*  1.24* 1.30*  CALCIUM 8.9  8.7* 8.5*   GFR CrCl cannot be calculated (Unknown ideal weight.). Liver Function Tests: Recent Labs  Lab 09/04/20 1545  AST 21  ALT 14  ALKPHOS 121  BILITOT 0.7  PROT 7.7  ALBUMIN 3.5   No results for input(s): LIPASE, AMYLASE in the last 168 hours. No results for input(s): AMMONIA in the last 168 hours. Coagulation  profile Recent Labs  Lab 09/04/20 1545  INR 1.0    CBC: Recent Labs  Lab 09/04/20 1545 09/05/20 0211  WBC 11.5* 6.6  HGB 9.4* 9.0*  HCT 33.1* 31.6*  MCV 83.0 82.7  PLT 224 225   Cardiac Enzymes: No results for input(s): CKTOTAL, CKMB, CKMBINDEX, TROPONINI in the  last 168 hours. BNP (last 3 results) No results for input(s): PROBNP in the last 8760 hours. CBG: No results for input(s): GLUCAP in the last 168 hours. D-Dimer: No results for input(s): DDIMER in the last 72 hours. Hgb A1c: No results for input(s): HGBA1C in the last 72 hours. Lipid Profile: No results for input(s): CHOL, HDL, LDLCALC, TRIG, CHOLHDL, LDLDIRECT in the last 72 hours. Thyroid function studies: No results for input(s): TSH, T4TOTAL, T3FREE, THYROIDAB in the last 72 hours.  Invalid input(s): FREET3 Anemia work up: No results for input(s): VITAMINB12, FOLATE, FERRITIN, TIBC, IRON, RETICCTPCT in the last 72 hours. Sepsis Labs: Recent Labs  Lab 09/04/20 1545 09/05/20 0211  WBC 11.5* 6.6    Microbiology Recent Results (from the past 240 hour(s))  Resp Panel by RT-PCR (Flu A&B, Covid) Nasopharyngeal Swab     Status: None   Collection Time: 09/04/20  6:47 PM   Specimen: Nasopharyngeal Swab; Nasopharyngeal(NP) swabs in vial transport medium  Result Value Ref Range Status   SARS Coronavirus 2 by RT PCR NEGATIVE NEGATIVE Final    Comment: (NOTE) SARS-CoV-2 target nucleic acids are NOT DETECTED.  The SARS-CoV-2 RNA is generally detectable in upper respiratory specimens during the acute phase of infection. The lowest concentration of SARS-CoV-2 viral copies this assay can detect is 138 copies/mL. A negative result does not preclude SARS-Cov-2 infection and should not be used as the sole basis for treatment or other patient management decisions. A negative result may occur with  improper specimen collection/handling, submission of specimen other than nasopharyngeal swab, presence of viral  mutation(s) within the areas targeted by this assay, and inadequate number of viral copies(<138 copies/mL). A negative result must be combined with clinical observations, patient history, and epidemiological information. The expected result is Negative.  Fact Sheet for Patients:  EntrepreneurPulse.com.au  Fact Sheet for Healthcare Providers:  IncredibleEmployment.be  This test is no t yet approved or cleared by the [place] FDA and  has been authorized for detection and/or diagnosis of SARS-CoV-2 by FDA under an Emergency Use Authorization (EUA). This EUA will remain  in effect (meaning this test can be used) for the duration of the COVID-19 declaration under Section 564(b)(1) of the Act, 21 U.S.C.section 360bbb-3(b)(1), unless the authorization is terminated  or revoked sooner.       Influenza A by PCR NEGATIVE NEGATIVE Final   Influenza B by PCR NEGATIVE NEGATIVE Final    Comment: (NOTE) The Xpert Xpress SARS-CoV-2/FLU/RSV plus assay is intended as an aid in the diagnosis of influenza from Nasopharyngeal swab specimens and should not be used as a sole basis for treatment. Nasal washings and aspirates are unacceptable for Xpert Xpress SARS-CoV-2/FLU/RSV testing.  Fact Sheet for Patients: EntrepreneurPulse.com.au  Fact Sheet for Healthcare Providers: IncredibleEmployment.be  This test is not yet approved or cleared by the [place] FDA and has been authorized for detection and/or diagnosis of SARS-CoV-2 by FDA under an Emergency Use Authorization (EUA). This EUA will remain in effect (meaning this test can be used) for the duration of the COVID-19 declaration under Section 564(b)(1) of the Act, 21 U.S.C. section 360bbb-3(b)(1), unless the authorization is terminated or revoked.  Performed at [facility], [address] [place],  [address]     Procedures and diagnostic studies:  CT  Head W or Wo Contrast  Result Date: 09/04/2020 CLINICAL DATA:  Encephalopathy EXAM: CT HEAD WITHOUT AND WITH CONTRAST TECHNIQUE: Contiguous axial images were obtained from the base of the skull through the vertex  without and with intravenous contrast CONTRAST:  50mL OMNIPAQUE IOHEXOL 300 MG/ML  SOLN COMPARISON:  None. FINDINGS: Brain: There is a homogeneously enhancing extra-axial mass of the anterior cranial fossa measuring 3.5 x 2.2 cm. There is a large amount of vasogenic edema in the overlying right frontal lobe. There is leftward midline shift with subfalcine herniation of the right cingulate gyrus. There is no intracranial hemorrhage or other extra-axial collection. The right lateral ventricle is effaced in the left is mildly dilated. Vascular: No hyperdense vessel or unexpected calcification. Visible vessels are patent. Skull: Normal. Negative for fracture or focal lesion. Sinuses/Orbits: No acute finding. Other: None. 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 There is leftward midline shift with subfalcine herniation of the right cingulate gyrus with early entrapment of the left lateral ventricle. Critical Value/emergent results were called by telephone at the time of interpretation on 09/04/2020 at 8:44 pm to provider GARRETT GREEN , who verbally acknowledged these results. Electronically Signed   By: Ulyses Jarred M.D.   On: 09/04/2020 20:46   CT CHEST ABDOMEN PELVIS W CONTRAST  Result Date: 09/05/2020 CLINICAL DATA:  Brain meningioma. EXAM: CT CHEST, ABDOMEN, AND PELVIS WITH CONTRAST TECHNIQUE: Multidetector CT imaging of the chest, abdomen and pelvis was performed following the standard protocol during bolus administration of intravenous contrast. CONTRAST:  83mL OMNIPAQUE IOHEXOL 350 MG/ML SOLN COMPARISON:  Multiple prior CT abdomen and pelvis, most recently from yesterday. FINDINGS: CT CHEST FINDINGS Cardiovascular: No  significant vascular findings. Normal heart size. No pericardial effusion. No thoracic aortic aneurysm or dissection. No central pulmonary embolism. Mediastinum/Nodes: No enlarged mediastinal, hilar, or axillary lymph nodes. 1.6 cm heterogeneously hypodense right thyroid nodule. The trachea and esophagus demonstrate no significant findings. Lungs/Pleura: Minimal dependent subsegmental atelectasis in both lungs. No focal consolidation, pleural effusion, or pneumothorax. No suspicious pulmonary nodule. Musculoskeletal: No acute or significant osseous findings. CT ABDOMEN PELVIS FINDINGS Hepatobiliary: Unchanged scattered small hepatic cysts. Unchanged small gallstones. No gallbladder wall thickening or biliary dilatation. Pancreas: Unremarkable. No pancreatic ductal dilatation or surrounding inflammatory changes. Spleen: Normal in size without focal abnormality. Adrenals/Urinary Tract: The adrenal glands are unremarkable. Unchanged simple cyst in the lower pole of the left kidney. Other subcentimeter low-density lesions in both kidneys are too small to characterize. Previously noted small bilateral renal calculi are obscured by excreted contrast. Unchanged 3 mm calculus in the mid to distal right ureter. Excreted contrast within the bladder. Stomach/Bowel: Stomach is within normal limits. Appendix appears normal. No evidence of bowel wall thickening, distention, or inflammatory changes. Left-sided colonic diverticulosis. Vascular/Lymphatic: No significant vascular findings are present. No enlarged abdominal or pelvic lymph nodes. Reproductive: Tiny calcified uterine fibroid.  No adnexal mass. Other: Unchanged tiny fat containing umbilical hernia. No free fluid or pneumoperitoneum. Musculoskeletal: No acute or significant osseous findings. IMPRESSION: 1. No suspicious mass lesion or lymphadenopathy in the chest, abdomen, or pelvis. 2. Unchanged 3 mm calculus in the mid to distal right ureter. 3. Previously noted small  bilateral renal calculi are obscured by excreted contrast. 4. 1.6 cm heterogeneously hypodense right thyroid nodule. Recommend thyroid US (ref: J Am Coll Radiol. 2015 Feb;12(2): 143-50). 5. Unchanged cholelithiasis. Electronically Signed   By: Titus Dubin M.D.   On: 09/05/2020 11:56   DG CHEST PORT 1 VIEW  Result Date: 09/04/2020 CLINICAL DATA:  Headache weakness EXAM: PORTABLE CHEST 1 VIEW COMPARISON:  None. FINDINGS: The heart size and mediastinal contours are within normal limits. Both lungs are clear.  Partially visualized intramedullary rod in the left humerus. Mild diffuse bronchitic changes. IMPRESSION: Mild bronchitic changes without focal pneumonia. Electronically Signed   By: Donavan Foil M.D.   On: 09/04/2020 22:11   CT Renal Stone Study  Result Date: 09/04/2020 CLINICAL DATA:  Hematuria EXAM: CT ABDOMEN AND PELVIS WITHOUT CONTRAST TECHNIQUE: Multidetector CT imaging of the abdomen and pelvis was performed following the standard protocol without IV contrast. COMPARISON:  CT 01/27/2019 FINDINGS: Lower chest: Lung bases demonstrate mild dependent atelectasis. Normal cardiac size. Hepatobiliary: Multiple calcified gallstones. No biliary dilatation. Stable probable hepatic cyst in the left hepatic lobe Pancreas: Unremarkable. No pancreatic ductal dilatation or surrounding inflammatory changes. Spleen: Normal in size without focal abnormality. Adrenals/Urinary Tract: Adrenal glands are normal. Cyst off the lower pole left kidney. 3 mm stone in the midpole of the left kidney. 2 mm stone in the mid right kidney. Mild right renal pelvis dilatation without significant hydroureter, there is a 3 mm stone in the mid to distal right ureter at the level of the upper sacrum. The urinary bladder is unremarkable. Stomach/Bowel: The stomach is nonenlarged. No dilated small bowel. Negative appendix. Possible mild wall thickening of the distal descending/proximal sigmoid colon. Scattered left colon diverticula  Vascular/Lymphatic: Nonaneurysmal aorta.  No suspicious nodes Reproductive: Probable small calcified uterine fibroid. No adnexal mass Other: Negative for free air or free fluid Musculoskeletal: No acute or significant osseous findings. IMPRESSION: 1. Mild right renal pelvis dilatation without significant hydroureter, secondary to a 3 mm stone in the mid to distal right ureter at the level of the upper sacrum. 2. Small intrarenal stones bilaterally. 3. Possible mild wall thickening of the distal descending/proximal sigmoid colon, could reflect a mild colitis of infectious, inflammatory, or ischemic etiology. 4. Gallstones. Electronically Signed   By: Donavan Foil M.D.   On: 09/04/2020 19:16               LOS: 1 day   Sue Fernicola  [facility]   Pager on [email or web address]. If 7PM-7AM, please contact night-coverage at [email or web address]     09/05/2020, 3:33 PM

## 2020-09-05 NOTE — ED Notes (Signed)
Dr Leonette Monarch at bedside updating pt at this time.

## 2020-09-05 NOTE — ED Notes (Signed)
Please have neurosurgeon call daughter, Jules Schick to discuss treatment plan.

## 2020-09-05 NOTE — ED Triage Notes (Signed)
Per [facility], pt coming from [facility] for neurosurgery consult. Pt reported increased weakness and was found to have a left side midline shift, pt also complained of black stools and pt found to have a gi bleed and bilateral kidney stones.  No deficits at this time. Aao4. 18g left wrist NS 123ml/hr infusing.

## 2020-09-05 NOTE — ED Notes (Signed)
Report given to Gretta Cool, Therapist, sports at [facility] ED [phone].

## 2020-09-05 NOTE — ED Notes (Signed)
Family provided recliner chair

## 2020-09-05 NOTE — ED Notes (Signed)
Patient's daughter Jules Schick notified of patient transfer to [facility] [phone].  [facility] to bedside to assume care of patient.

## 2020-09-05 NOTE — ED Notes (Signed)
Report given to Ryan with [facility]  

## 2020-09-06 LAB — URINE CULTURE: Culture: <10000 — AB

## 2020-09-06 LAB — BASIC METABOLIC PANEL
Anion gap: 11 (ref 5–15)
BUN: 23 mg/dL (ref 8–23)
CO2: 21 mmol/L — ABNORMAL LOW (ref 22–32)
Calcium: 8 mg/dL — ABNORMAL LOW (ref 8.9–10.3)
Chloride: 108 mmol/L (ref 98–111)
Creatinine, Ser: 1.28 mg/dL — ABNORMAL HIGH (ref 0.44–1.00)
GFR, Estimated: 45 mL/min — ABNORMAL LOW (ref >60)
Glucose, Bld: 146 mg/dL — ABNORMAL HIGH (ref 70–99)
Potassium: 3.9 mmol/L (ref 3.5–5.1)
Sodium: 140 mmol/L (ref 135–145)

## 2020-09-06 LAB — CBC WITH DIFFERENTIAL/PLATELET
Abs Immature Granulocytes: 0.06 10*3/uL (ref 0.00–0.07)
Basophils Absolute: 0 10*3/uL (ref 0.0–0.1)
Basophils Relative: 0 %
Eosinophils Absolute: 0 10*3/uL (ref 0.0–0.5)
Eosinophils Relative: 0 %
HCT: 27.7 % — ABNORMAL LOW (ref 36.0–46.0)
Hemoglobin: 7.9 g/dL — ABNORMAL LOW (ref 12.0–15.0)
Immature Granulocytes: 1 %
Lymphocytes Relative: 8 %
Lymphs Abs: 0.7 10*3/uL (ref 0.7–4.0)
MCH: 23.4 pg — ABNORMAL LOW (ref 26.0–34.0)
MCHC: 28.5 g/dL — ABNORMAL LOW (ref 30.0–36.0)
MCV: 82 fL (ref 80.0–100.0)
Monocytes Absolute: 0.1 10*3/uL (ref 0.1–1.0)
Monocytes Relative: 1 %
Neutro Abs: 8.1 10*3/uL — ABNORMAL HIGH (ref 1.7–7.7)
Neutrophils Relative %: 90 %
Platelets: 183 10*3/uL (ref 150–400)
RBC: 3.38 MIL/uL — ABNORMAL LOW (ref 3.87–5.11)
RDW: 23.8 % — ABNORMAL HIGH (ref 11.5–15.5)
WBC: 9 10*3/uL (ref 4.0–10.5)
nRBC: 0 % (ref 0.0–0.2)

## 2020-09-06 NOTE — Progress Notes (Signed)
PROGRESS NOTE  [name] Page  DOB: 09/20/1951  PCP: Janora Norlander, DO IRC:[id number]  DOA: 09/04/2020  LOS: 2 days   Chief Complaint  Patient presents with  . Weakness   Brief narrative: [name] Page is a 69 y.o. 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 Patient was brought to the ED from home by EMS on 12/16 f black stool and worsening generalized weakness. She was recently found to be anemic and started on iron supplement. Apparently, she was on schedule for EGD and colonoscopy on 09/05/2020.   Her daughter reported that patient was confused, forgetful and has been complaining of headache recently.  Apparently, patient has had a headache for over a year but headache had worsened recently.  She had also had few episodes of vomiting.  In the ED on her blood pressure was elevated up to 200. UA with large amount of hemoglobin, moderate leukocytes greater than 50 WBCs.   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 EDP talked to neurosurgeon on-call, Dr. Leward Quan recommended to initiate Decadron 10 mg daily and admission under hospitalist service at [facility]. Patient also had an abdominal CT which showed showed 3 mm stone without significant hydroureter.  EDP talked to urologist Dr. Caprice Beaver, recommended continue antibiotics.  Subjective: Patient was seen and examined this morning.  Present elderly Caucasian female.  Lying on bed.  Not in distress.  No significant headache this morning.  Complains of some confusion Hemoglobin down to 7.9 this morning  Assessment/Plan: Meningioma  -CT scan of the head showed right anterior cranial fossa mass concerning for  meningioma versus metastasis with significant vasogenic edema and midline shift and herniation. -MRI brain showed a 3.3 X 4.1 x 2.4 cm meningioma at the skull base on the right with mass-effect upon the inferior right frontal. -Seen by neurosurgeon.  Started on IV dexamethasone and IV Keppra for seizure prophylaxis. -CT chest abdomen and pelvis did not show any evidence of malignancy. -Noted a plan from neurosurgery for nodule resection early next week.  Iron deficiency anemia -She was recently found to be anemic and started on iron supplement. Apparently, she was on schedule for EGD and colonoscopy on 09/05/2020.   -Continues to have mild black stool, hemoglobin down to 7.9 today.  Continue to monitor. -May need inpatient GI consult if hemoglobin continues to drop.  Otherwise needs to reschedule outpatient EGD and colonoscopy post discharge. Recent Labs    07/21/20 1100 08/20/20 1437 09/04/20 1545 09/04/20 1545 09/05/20 0211 09/06/20 0312  HGB 7.6* 9.8* 9.4*  --  9.0* 7.9*  MCV 71*  --  83.0   < > 82.7 82.0  FERRITIN  --  10.1  --   --   --   --   IRON  --  28*  --   --   --   --    < > = values in this interval not displayed.   Squamous cell carcinoma vs basal cell carcinoma of the skin of the chest, right breast and lower back:  -Patient's daughter said she had seen Dr. Milinda Antis many many years ago for this.  However, patient was lost to follow-up and did not go back to see Dr. Milinda Antis.  Her daughter said she  was not aware that lesions had grown in size.  Since there is no dermatologist on service, patient will follow up with dermatologist as an outpatient at discharge.  UTI  -Urinalysis with moderate leukocytosis.  CT abdomen with bilateral nephrolithiasis, nonobstructing right urolithiasis and mild right renal pelvis dilatation without significant hydroureter. -Currently on empiric IV antibiotics.   Mobility: Encourage ambulation Code Status:   Code Status: Partial Code   Nutritional status: There is no height or weight on file to calculate BMI.     Diet Order            Diet Heart Room service appropriate? Yes; Fluid consistency: Thin  Diet effective now                 DVT prophylaxis: SCDs Start: 09/04/20 2222   Antimicrobials:  None Fluid: None Consultants: Neurosurgery Family Communication:  None at bedside  Status is: Inpatient  Remains inpatient appropriate because -pending neurosurgery  Dispo: The patient is from: Home              Anticipated d/c is to: Home              Anticipated d/c date is: > 3 days              Patient currently is not medically stable to d/c.       Infusions:  . cefTRIAXone (ROCEPHIN)  IV Stopped (09/05/20 2103)    Scheduled Meds: . dexamethasone (DECADRON) injection  4 mg Intravenous Q6H  . levETIRAcetam  500 mg Oral BID    Antimicrobials: Anti-infectives (From admission, onward)   Start     Dose/Rate Route Frequency Ordered Stop   09/05/20 1800  cefTRIAXone (ROCEPHIN) 1 g in sodium chloride 0.9 % 100 mL IVPB        1 g 200 mL/hr over 30 Minutes Intravenous Every 24 hours 09/04/20 2221     09/04/20 1845  cefTRIAXone (ROCEPHIN) 1 g in sodium chloride 0.9 % 100 mL IVPB        1 g 200 mL/hr over 30 Minutes Intravenous  Once 09/04/20 1842 09/04/20 2001      PRN meds: acetaminophen **OR** acetaminophen, ondansetron **OR** ondansetron (ZOFRAN) IV   Objective: Vitals:   09/06/20 0743 09/06/20 1113  BP: (!) 142/92 (!) 117/58  Pulse: 70 78  Resp: 14 16  Temp: 98.2 F (36.8 C) 98.3 F (36.8 C)  SpO2: 94% 96%    Intake/Output Summary (Last 24 hours) at 09/06/2020 1423 Last data filed at 09/05/2020 2103 Gross per 24 hour  Intake 100 ml  Output --  Net 100 ml   There were no vitals filed for this visit. Weight change:  There is no height or weight on file to calculate BMI.   Physical Exam: General exam: Pleasant, elderly Caucasian female.  Not in distress Skin: No rashes, lesions  or ulcers. HEENT: Atraumatic, normocephalic, no obvious bleeding Lungs: Clear to auscultation bilaterally CVS: Regular rate and rhythm, no murmur GI/Abd soft, nontender, nondistended, bowel sound present CNS: Alert, awake, oriented x3.  Slow to respond Psychiatry: Mood appropriate Extremities: No pedal edema, no calf tenderness  Data Review: I have personally reviewed the laboratory data and studies available.  Recent Labs  Lab 09/04/20 1545 09/05/20 0211 09/06/20 0312  WBC 11.5* 6.6 9.0  NEUTROABS  --   --  8.1*  HGB 9.4* 9.0* 7.9*  HCT 33.1* 31.6* 27.7*  MCV 83.0 82.7 82.0  PLT 224 225 183   Recent  Labs  Lab 09/04/20 1545 09/05/20 0211 09/06/20 0312  NA 140  140 141 140  K 3.7  3.8 4.0 3.9  CL 104  104 104 108  CO2 25  25 24  21*  GLUCOSE 126*  127* 128* 146*  BUN 19  18 17 23   CREATININE 1.23*  1.24* 1.30* 1.28*  CALCIUM 8.9  8.7* 8.5* 8.0*    F/u labs ordered.  Signed, Terrilee Croak, MD [facility] 09/06/2020

## 2020-09-06 NOTE — Progress Notes (Signed)
No new issues or problems.  Patient with some generalized confusion which is stable.  Motor and sensory function are grossly intact.  No cranial nerve deficits.  Patient with significant left-sided anterior skull base lesion consistent with meningioma.  Plan for resection early next week per Dr. Marcello Moores.

## 2020-09-07 LAB — CBC WITH DIFFERENTIAL/PLATELET
Abs Immature Granulocytes: 0.06 10*3/uL (ref 0.00–0.07)
Basophils Absolute: 0 10*3/uL (ref 0.0–0.1)
Basophils Relative: 0 %
Eosinophils Absolute: 0 10*3/uL (ref 0.0–0.5)
Eosinophils Relative: 0 %
HCT: 27 % — ABNORMAL LOW (ref 36.0–46.0)
Hemoglobin: 7.7 g/dL — ABNORMAL LOW (ref 12.0–15.0)
Immature Granulocytes: 1 %
Lymphocytes Relative: 9 %
Lymphs Abs: 0.7 10*3/uL (ref 0.7–4.0)
MCH: 23.4 pg — ABNORMAL LOW (ref 26.0–34.0)
MCHC: 28.5 g/dL — ABNORMAL LOW (ref 30.0–36.0)
MCV: 82.1 fL (ref 80.0–100.0)
Monocytes Absolute: 0.3 10*3/uL (ref 0.1–1.0)
Monocytes Relative: 3 %
Neutro Abs: 6.9 10*3/uL (ref 1.7–7.7)
Neutrophils Relative %: 87 %
Platelets: 186 10*3/uL (ref 150–400)
RBC: 3.29 MIL/uL — ABNORMAL LOW (ref 3.87–5.11)
RDW: 23.7 % — ABNORMAL HIGH (ref 11.5–15.5)
WBC: 7.9 10*3/uL (ref 4.0–10.5)
nRBC: 0 % (ref 0.0–0.2)

## 2020-09-07 LAB — BASIC METABOLIC PANEL
Anion gap: 7 (ref 5–15)
BUN: 31 mg/dL — ABNORMAL HIGH (ref 8–23)
CO2: 23 mmol/L (ref 22–32)
Calcium: 8.2 mg/dL — ABNORMAL LOW (ref 8.9–10.3)
Chloride: 107 mmol/L (ref 98–111)
Creatinine, Ser: 1.29 mg/dL — ABNORMAL HIGH (ref 0.44–1.00)
GFR, Estimated: 45 mL/min — ABNORMAL LOW (ref >60)
Glucose, Bld: 148 mg/dL — ABNORMAL HIGH (ref 70–99)
Potassium: 3.9 mmol/L (ref 3.5–5.1)
Sodium: 137 mmol/L (ref 135–145)

## 2020-09-07 MED ORDER — FERROUS SULFATE 325 (65 FE) MG PO TABS
325.0000 mg | ORAL_TABLET | Freq: Every day | ORAL | Status: DC
  Administered 2020-09-08 – 2020-09-17 (×9): 325 mg via ORAL
  Filled 2020-09-07 (×9): qty 1

## 2020-09-07 NOTE — Progress Notes (Signed)
Neurosurgery Service Progress Note  Subjective: No acute events overnight, no new complaints, improving encephalopathy 2/2 steroids   Objective: Vitals:   09/06/20 2303 09/07/20 0433 09/07/20 0850 09/07/20 1148  BP: (!) 164/71 (!) 185/73 (!) 156/76 (!) 141/60  Pulse: 65 (!) 56 72 76  Resp: 18 18 18 20   Temp: 98.4 F (36.9 C) 98.3 F (36.8 C) 97.9 F (36.6 C) 97.8 F (36.6 C)  TempSrc: Oral Oral Oral Oral  SpO2: 95% 97% 95% 97%  Weight:  63.9 kg    Height:  5\' 2"  (1.575 m)      Physical Exam: Awake/alert and interactive, sitting on edge of bed about to work with PT  Assessment & Plan: 69 y.o. woman w/ R sphenoid wing meningioma.  -OR this week w/ Dr. Marcello Moores, no change in neurosurgical plan of care, continue IV dex to decrease edema preop  Judith Part  09/07/20 3:36 PM

## 2020-09-07 NOTE — Progress Notes (Signed)
PROGRESS NOTE  [name] Page  DOB: June 11, 1951  PCP: Janora Norlander, DO IOX:[id number]  DOA: 09/04/2020  LOS: 3 days   Chief Complaint  Patient presents with  . Weakness   Brief narrative: [name] Page is a 69 y.o. 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 Patient was brought to the ED from home by EMS on 12/16 f black stool and worsening generalized weakness. She was recently found to be anemic and started on iron supplement. Apparently, she was on schedule for EGD and colonoscopy on 09/05/2020.   Her daughter reported that patient was confused, forgetful and has been complaining of headache recently.  Apparently, patient has had a headache for over a year but headache had worsened recently.  She had also had few episodes of vomiting.  In the ED on her blood pressure was elevated up to 200. UA with large amount of hemoglobin, moderate leukocytes greater than 50 WBCs.   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 EDP talked to neurosurgeon on-call, Dr. Leward Quan recommended to initiate Decadron 10 mg daily and admission under hospitalist service at [facility]. Patient also had an abdominal CT which showed showed 3 mm stone without significant hydroureter.  EDP talked to urologist Dr. Caprice Beaver, recommended continue antibiotics.  Subjective: Patient was seen and examined this morning.   Lying down in bed.  Not in distress.  No new symptoms.  Reports tremors generalized for last few months.  Assessment/Plan: Meningioma  -CT scan of the head showed right anterior cranial fossa mass concerning for meningioma versus metastasis with significant vasogenic edema and  midline shift and herniation. -MRI brain showed a 3.3 X 4.1 x 2.4 cm meningioma at the skull base on the right with mass-effect upon the inferior right frontal. -Seen by neurosurgeon.  Started on IV dexamethasone and IV Keppra for seizure prophylaxis. -CT chest abdomen and pelvis did not show any evidence of malignancy. -Noted a plan from neurosurgery for nodule resection tentatively on Tuesday 12/21.  Iron deficiency anemia -She was recently found to be anemic and started on iron supplement. Apparently, she was on schedule for EGD and colonoscopy on 09/05/2020.   -Continues to have mild black stool, hemoglobin down to 7. 7 today.  Continue to monitor. -May need inpatient GI consult if hemoglobin continues to drop.  Otherwise needs to reschedule outpatient EGD and colonoscopy post discharge. -Resume iron supplement. Recent Labs    08/20/20 1437 09/04/20 1545 09/04/20 1545 09/05/20 0211 09/06/20 0312 09/07/20 0108  HGB 9.8* 9.4*  --  9.0* 7.9* 7.7*  MCV  --  83.0   < > 82.7 82.0 82.1  FERRITIN 10.1  --   --   --   --   --   IRON 28*  --   --   --   --   --    < > = values in this interval not displayed.   Squamous cell carcinoma vs basal cell carcinoma of the skin of the chest, right breast and lower back:  -Patient's daughter said she had seen Dr. Milinda Antis many many years ago for this.  However, patient was lost to follow-up and did not go back to see Dr. Milinda Antis.  Her daughter said she was not aware  that lesions had grown in size.  Since there is no dermatologist on service, patient will follow up with dermatologist as an outpatient at discharge.  UTI  -Urinalysis with moderate leukocytosis.  CT abdomen with bilateral nephrolithiasis, nonobstructing right urolithiasis and mild right renal pelvis dilatation without significant hydroureter. -Insignificant growth in urine culture.  Completed 3-day course of IV Rocephin on 12/18.  Generalized tremors -She has generalized resting tremors of  all 4 extremities.  Reports for last few months.  Probably related to meningioma and mass-effect secondary to it.  Mobility: Encourage ambulation Code Status:   Code Status: Partial Code  Nutritional status: Body mass index is 25.77 kg/m.     Diet Order            Diet Heart Room service appropriate? Yes; Fluid consistency: Thin  Diet effective now                 DVT prophylaxis: SCDs Start: 09/04/20 2222   Antimicrobials:  None Fluid: None Consultants: Neurosurgery Family Communication:  None at bedside  Status is: Inpatient  Remains inpatient appropriate because -pending neurosurgery  Dispo: The patient is from: Home              Anticipated d/c is to: Home              Anticipated d/c date is: > 3 days              Patient currently is not medically stable to d/c.       Infusions:  . cefTRIAXone (ROCEPHIN)  IV 1 g (09/06/20 1747)    Scheduled Meds: . dexamethasone (DECADRON) injection  4 mg Intravenous Q6H  . levETIRAcetam  500 mg Oral BID    Antimicrobials: Anti-infectives (From admission, onward)   Start     Dose/Rate Route Frequency Ordered Stop   09/05/20 1800  cefTRIAXone (ROCEPHIN) 1 g in sodium chloride 0.9 % 100 mL IVPB        1 g 200 mL/hr over 30 Minutes Intravenous Every 24 hours 09/04/20 2221     09/04/20 1845  cefTRIAXone (ROCEPHIN) 1 g in sodium chloride 0.9 % 100 mL IVPB        1 g 200 mL/hr over 30 Minutes Intravenous  Once 09/04/20 1842 09/04/20 2001      PRN meds: acetaminophen **OR** acetaminophen, ondansetron **OR** ondansetron (ZOFRAN) IV   Objective: Vitals:   09/07/20 0433 09/07/20 0850  BP: (!) 185/73 (!) 156/76  Pulse: (!) 56 72  Resp: 18 18  Temp: 98.3 F (36.8 C) 97.9 F (36.6 C)  SpO2: 97% 95%   No intake or output data in the 24 hours ending 09/07/20 1103 Filed Weights   09/07/20 0433  Weight: 63.9 kg   Weight change:  Body mass index is 25.77 kg/m.   Physical Exam: General exam: Pleasant, elderly  Caucasian female.  Not in distress Skin: No rashes, lesions or ulcers. HEENT: Atraumatic, normocephalic, no obvious bleeding Lungs: Clear to auscultation bilaterally CVS: Regular rate and rhythm, no murmur GI/Abd soft, nontender, nondistended, bowel sound present CNS: Alert, awake, oriented x3.  Generalized mild shaking of all 4 extremities at rest Psychiatry: Mood appropriate Extremities: No pedal edema, no calf tenderness  Data Review: I have personally reviewed the laboratory data and studies available.  Recent Labs  Lab 09/04/20 1545 09/05/20 0211 09/06/20 0312 09/07/20 0108  WBC 11.5* 6.6 9.0 7.9  NEUTROABS  --   --  8.1* 6.9  HGB 9.4* 9.0*  7.9* 7.7*  HCT 33.1* 31.6* 27.7* 27.0*  MCV 83.0 82.7 82.0 82.1  PLT 224 225 183 186   Recent Labs  Lab 09/04/20 1545 09/05/20 0211 09/06/20 0312 09/07/20 0108  NA 140  140 141 140 137  K 3.7  3.8 4.0 3.9 3.9  CL 104  104 104 108 107  CO2 25  25 24  21* 23  GLUCOSE 126*  127* 128* 146* 148*  BUN 19  18 17 23  31*  CREATININE 1.23*  1.24* 1.30* 1.28* 1.29*  CALCIUM 8.9  8.7* 8.5* 8.0* 8.2*    F/u labs ordered.  Signed, Terrilee Croak, MD [facility] 09/07/2020

## 2020-09-08 ENCOUNTER — Other Ambulatory Visit: Payer: Self-pay | Admitting: Neurosurgery

## 2020-09-08 LAB — CBC WITH DIFFERENTIAL/PLATELET
Abs Immature Granulocytes: 0.05 10*3/uL (ref 0.00–0.07)
Basophils Absolute: 0 10*3/uL (ref 0.0–0.1)
Basophils Relative: 0 %
Eosinophils Absolute: 0 10*3/uL (ref 0.0–0.5)
Eosinophils Relative: 0 %
HCT: 27.6 % — ABNORMAL LOW (ref 36.0–46.0)
Hemoglobin: 8 g/dL — ABNORMAL LOW (ref 12.0–15.0)
Immature Granulocytes: 1 %
Lymphocytes Relative: 12 %
Lymphs Abs: 0.8 10*3/uL (ref 0.7–4.0)
MCH: 23.7 pg — ABNORMAL LOW (ref 26.0–34.0)
MCHC: 29 g/dL — ABNORMAL LOW (ref 30.0–36.0)
MCV: 81.9 fL (ref 80.0–100.0)
Monocytes Absolute: 0.2 10*3/uL (ref 0.1–1.0)
Monocytes Relative: 3 %
Neutro Abs: 5.5 10*3/uL (ref 1.7–7.7)
Neutrophils Relative %: 84 %
Platelets: 176 10*3/uL (ref 150–400)
RBC: 3.37 MIL/uL — ABNORMAL LOW (ref 3.87–5.11)
RDW: 22.6 % — ABNORMAL HIGH (ref 11.5–15.5)
WBC: 6.5 10*3/uL (ref 4.0–10.5)
nRBC: 0 % (ref 0.0–0.2)

## 2020-09-08 LAB — BASIC METABOLIC PANEL
Anion gap: 9 (ref 5–15)
BUN: 32 mg/dL — ABNORMAL HIGH (ref 8–23)
CO2: 23 mmol/L (ref 22–32)
Calcium: 8.4 mg/dL — ABNORMAL LOW (ref 8.9–10.3)
Chloride: 106 mmol/L (ref 98–111)
Creatinine, Ser: 1.15 mg/dL — ABNORMAL HIGH (ref 0.44–1.00)
GFR, Estimated: 51 mL/min — ABNORMAL LOW (ref >60)
Glucose, Bld: 140 mg/dL — ABNORMAL HIGH (ref 70–99)
Potassium: 4.3 mmol/L (ref 3.5–5.1)
Sodium: 138 mmol/L (ref 135–145)

## 2020-09-08 MED ORDER — PANTOPRAZOLE SODIUM 40 MG PO TBEC
40.0000 mg | DELAYED_RELEASE_TABLET | Freq: Every day | ORAL | Status: DC
  Administered 2020-09-08 – 2020-09-10 (×3): 40 mg via ORAL
  Filled 2020-09-08 (×3): qty 1

## 2020-09-08 NOTE — Plan of Care (Signed)

## 2020-09-08 NOTE — Progress Notes (Signed)
Subjective: Patient reports some headache.  Objective: Vital signs in last 24 hours: Temp:  [97.8 F (36.6 C)-98.4 F (36.9 C)] 98.4 F (36.9 C) (12/20 1116) Pulse Rate:  [56-75] 62 (12/20 1116) Resp:  [18] 18 (12/20 1116) BP: (153-187)/(64-82) 162/64 (12/20 1116) SpO2:  [92 %-98 %] 92 % (12/20 1116)  Intake/Output from previous day: 12/19 0701 - 12/20 0700 In: -  Out: 1 [Urine:1] Intake/Output this shift: Total I/O In: 240 [P.O.:240] Out: 2 [Urine:2]  Awake, alert, Ox3.  Some cognitive slowing noted.  VFC, EOMI No pronator drift, but left shoulder mobility limited. Chest wall lesion bandaged.  Lab Results: Recent Labs    09/07/20 0108 09/08/20 0240  WBC 7.9 6.5  HGB 7.7* 8.0*  HCT 27.0* 27.6*  PLT 186 176   BMET Recent Labs    09/07/20 0108 09/08/20 0240  NA 137 138  K 3.9 4.3  CL 107 106  CO2 23 23  GLUCOSE 148* 140*  BUN 31* 32*  CREATININE 1.29* 1.15*  CALCIUM 8.2* 8.4*    Studies/Results: No results found.  Assessment/Plan: Right anterior skull base tumor - I had a long discussion with the patient and daughter at the bedside.  Given size of tumor, significant associated vasogenic edema and associated cognitive decline, I have recommended surgical resection.  Surgical technique, risks, benefits, alternatives, and expected convalescence were discussed.  Risks discussed included but were not limited to bleeding, pain, infection, seizure, stroke, scar, recurrence, visual difficulty,  cerebrospinal fluid leak, neurologic deficit, paralysis, coma and death.  I also discussed with her the possibility of blood transfusion during surgery and consent to transfuse blood products if necessary was also obtained.  All questions and concerns were answered and understanding and agreement with the plan was verbalized. - surgery has been scheduled for 12/23    Vallarie Mare 09/08/2020, 12:26 PM

## 2020-09-08 NOTE — Progress Notes (Signed)
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  She was recently started on iron supplements for anemia.  Apparently she was scheduled for EGD and colonoscopy on 09/05/20. Her daughter reports she has been confused, forgetful and been complaining about headache recently, Patient reports intermittent headache for almost a year which has recently gotten worse. Head CT showed anterior cranial fossa meningioma causing severe vasogenic edema within the right frontal lobe, leftward midline shift with subfalcine herniation of the right cingulate gyrus with early entrapment of the left lateral ventricle. Neurosurgery Dr. Leward Quan was consulted by ED physician who recommended to initiate Decadron 10 mg daily.  Neurosurgery is planning to do removal of meningioma on 12/23.  She is also found to have a 3 mm stone without significant hydronephrosis,  Dr. Caprice Beaver urologist was consulted,  recommended continue antibiotics.   Assessment & Plan:   Principal Problem:   Meningioma ([place]) Active Problems:   AMS (altered mental status)   Vasogenic Cerebral edema ([facility])   Generalized weakness   Cancer of skin, squamous cell   Nephrolithiasis   UTI (urinary tract infection)   Meningioma: -Patient reports intermittent headache for almost a year which has gotten worse. -CT scan of the head showed right anterior cranial fossa mass concerning for meningioma versus metastasis with significant vasogenic edema and midline shift and herniation. -MRI brain showed a 3.3 X 4.1 x 2.4 cm meningioma at the skull base on the right with mass-effect upon the  inferior right frontal. -Seen by neurosurgeon.  Started on IV dexamethasone and IV Keppra for seizure prophylaxis. -CT chest abdomen and pelvis did not show any evidence of malignancy. -Neurosurgery recommended surgical resection of the mass, tentatively scheduled on 12/23.   Iron deficiency anemia -She was recently found to be anemic and started on iron supplement.Apparently, she was on schedule for EGD and colonoscopy on 09/05/2020.  -Continues to have mild black stools, hemoglobin down to 8.0.  Continue to monitor. -May need inpatient GI consult if hemoglobin continues to drop.  Otherwise needs to reschedule outpatient EGD and colonoscopy post discharge. -Resume iron supplement.   Squamous cell carcinomavsbasal cell carcinomaof the skin of the chest, right breast and lower back:  -Patient's daughter said she had seen Dr. Milinda Antis many years ago for this. However, patient was lost to follow-up and did not go back to see Dr. Milinda Antis. Her daughter said she was not aware that lesions had grown in size. Since there is no dermatologist on service, patient will follow up with dermatologist as an outpatient at discharge.  UTI  -Urinalysis with moderate leukocytosis.  CT abdomen with bilateral nephrolithiasis, nonobstructing right urolithiasis and mild right renal pelvis dilatation without significant hydroureter. -Insignificant growth in urine culture.  Completed 3-day course of IV Rocephin on 12/18. -Patient denies any urinary symptoms.  Generalized tremors -She has generalized resting tremors of all 4 extremities.  Reports for last few months.  Probably related to meningioma and mass-effect secondary to it.    DVT prophylaxis: SCDs Code Status: Partial Code Family Communication: No family at bedside. Disposition Plan:  Status is: Inpatient  Remains inpatient appropriate because:Inpatient level of  care appropriate due to severity of illness   Dispo: The patient is from: Home               Anticipated d/c is to: Home              Anticipated d/c date is: > 3 days              Patient currently is not medically stable to d/c.   Consultants:   Neurosurgery  Urology  Procedures: Antimicrobials: Anti-infectives (From admission, onward)   Start     Dose/Rate Route Frequency Ordered Stop   09/05/20 1800  cefTRIAXone (ROCEPHIN) 1 g in sodium chloride 0.9 % 100 mL IVPB  Status:  Discontinued        1 g 200 mL/hr over 30 Minutes Intravenous Every 24 hours 09/04/20 2221 09/07/20 1106   09/04/20 1845  cefTRIAXone (ROCEPHIN) 1 g in sodium chloride 0.9 % 100 mL IVPB        1 g 200 mL/hr over 30 Minutes Intravenous  Once 09/04/20 1842 09/04/20 2001      Subjective: Patient was seen and examined at bedside.  Overnight events noted.  Patient reports feeling better.  She reports intermittent headache.  Denies any weakness or numbness.  Objective: Vitals:   09/07/20 2313 09/08/20 0442 09/08/20 0750 09/08/20 1116  BP: (!) 187/75 (!) 171/64 (!) 179/82 (!) 162/64  Pulse: (!) 58 (!) 56 63 62  Resp: 18 18 18 18   Temp: 97.9 F (36.6 C) 98.2 F (36.8 C) 97.8 F (36.6 C) 98.4 F (36.9 C)  TempSrc: Oral Oral Oral Oral  SpO2: 95% 95% 96% 92%  Weight:      Height:        Intake/Output Summary (Last 24 hours) at 09/08/2020 1314 Last data filed at 09/08/2020 1100 Gross per 24 hour  Intake 240 ml  Output 3 ml  Net 237 ml   Filed Weights   09/07/20 0433  Weight: 63.9 kg    Examination:  General exam: Appears calm and comfortable, not in any acute distress. Respiratory system: Clear to auscultation. Respiratory effort normal. Cardiovascular system: S1 & S2 heard, RRR. No JVD, murmurs, rubs, gallops or clicks. No pedal edema. Gastrointestinal system: Abdomen is nondistended, soft and nontender. No organomegaly or masses felt. Normal bowel sounds heard. Central nervous system: Alert and oriented. No focal neurological deficits. Extremities: No edema, no cyanosis,  no clubbing. Skin: No rashes, lesions or ulcers Psychiatry: Judgement and insight appear normal. Mood & affect appropriate.     Data Reviewed: I have personally reviewed following labs and imaging studies  CBC: Recent Labs  Lab 09/04/20 1545 09/05/20 0211 09/06/20 0312 09/07/20 0108 09/08/20 0240  WBC 11.5* 6.6 9.0 7.9 6.5  NEUTROABS  --   --  8.1* 6.9 5.5  HGB 9.4* 9.0* 7.9* 7.7* 8.0*  HCT 33.1* 31.6* 27.7* 27.0* 27.6*  MCV 83.0 82.7 82.0 82.1 81.9  PLT 224 225 183 186 073   Basic Metabolic Panel: Recent Labs  Lab 09/04/20 1545 09/05/20 0211 09/06/20 0312 09/07/20 0108 09/08/20 0240  NA 140  140 141 140 137 138  K 3.7  3.8 4.0 3.9 3.9 4.3  CL 104  104 104 108 107 106  CO2 25  25 24  21* 23 23  GLUCOSE 126*  127* 128* 146* 148* 140*  BUN 19  18 17 23  31* 32*  CREATININE 1.23*  1.24* 1.30* 1.28* 1.29* 1.15*  CALCIUM 8.9  8.7* 8.5* 8.0* 8.2* 8.4*  GFR: Estimated Creatinine Clearance: 40 mL/min (A) (by C-G formula based on SCr of 1.15 mg/dL (H)). Liver Function Tests: Recent Labs  Lab 09/04/20 1545  AST 21  ALT 14  ALKPHOS 121  BILITOT 0.7  PROT 7.7  ALBUMIN 3.5   No results for input(s): LIPASE, AMYLASE in the last 168 hours. No results for input(s): AMMONIA in the last 168 hours. Coagulation Profile: Recent Labs  Lab 09/04/20 1545  INR 1.0   Cardiac Enzymes: No results for input(s): CKTOTAL, CKMB, CKMBINDEX, TROPONINI in the last 168 hours. BNP (last 3 results) No results for input(s): PROBNP in the last 8760 hours. HbA1C: No results for input(s): HGBA1C in the last 72 hours. CBG: No results for input(s): GLUCAP in the last 168 hours. Lipid Profile: No results for input(s): CHOL, HDL, LDLCALC, TRIG, CHOLHDL, LDLDIRECT in the last 72 hours. Thyroid Function Tests: No results for input(s): TSH, T4TOTAL, FREET4, T3FREE, THYROIDAB in the last 72 hours. Anemia Panel: No results for input(s): VITAMINB12, FOLATE, FERRITIN, TIBC, IRON,  RETICCTPCT in the last 72 hours. Sepsis Labs: No results for input(s): PROCALCITON, LATICACIDVEN in the last 168 hours.  Recent Results (from the past 240 hour(s))  Urine culture     Status: Abnormal   Collection Time: 09/04/20  5:24 PM   Specimen: Urine, Clean Catch  Result Value Ref Range Status   Specimen Description   Final    URINE, CLEAN CATCH Performed at [facility], [address], [place], [place] [address]    Special Requests   Final    NONE Performed at [facility], [address] [place], [place] [address]    Culture (A)  Final    <10,000 COLONIES/mL INSIGNIFICANT GROWTH Performed at [facility] [address] [place],  [address]    Report Status 09/06/2020 FINAL  Final  Resp Panel by RT-PCR (Flu A&B, Covid) Nasopharyngeal Swab     Status: None   Collection Time: 09/04/20  6:47 PM   Specimen: Nasopharyngeal Swab; Nasopharyngeal(NP) swabs in vial transport medium  Result Value Ref Range Status   SARS Coronavirus 2 by RT PCR NEGATIVE NEGATIVE Final    Comment: (NOTE) SARS-CoV-2 target nucleic acids are NOT DETECTED.  The SARS-CoV-2 RNA is generally detectable in upper respiratory specimens during the acute phase of infection. The lowest concentration of SARS-CoV-2 viral copies this assay can detect is 138 copies/mL. A negative result does not preclude SARS-Cov-2 infection and should not be used as the sole basis for treatment or other patient management decisions. A negative result may occur with  improper specimen collection/handling, submission of specimen other than nasopharyngeal swab, presence of viral mutation(s) within the areas targeted by this assay, and inadequate number of viral copies(<138 copies/mL). A negative result must be combined with clinical observations, patient history, and epidemiological information. The expected result is Negative.  Fact Sheet for Patients:  EntrepreneurPulse.com.au  Fact Sheet for  Healthcare Providers:  IncredibleEmployment.be  This test is no t yet approved or cleared by the [place] FDA and  has been authorized for detection and/or diagnosis of SARS-CoV-2 by FDA under an Emergency Use Authorization (EUA). This EUA will remain  in effect (meaning this test can be used) for the duration of the COVID-19 declaration under Section 564(b)(1) of the Act, 21 U.S.C.section 360bbb-3(b)(1), unless the authorization is terminated  or revoked sooner.       Influenza A by PCR NEGATIVE NEGATIVE Final   Influenza B by PCR NEGATIVE NEGATIVE Final  Comment: (NOTE) The Xpert Xpress SARS-CoV-2/FLU/RSV plus assay is intended as an aid in the diagnosis of influenza from Nasopharyngeal swab specimens and should not be used as a sole basis for treatment. Nasal washings and aspirates are unacceptable for Xpert Xpress SARS-CoV-2/FLU/RSV testing.  Fact Sheet for Patients: EntrepreneurPulse.com.au  Fact Sheet for Healthcare Providers: IncredibleEmployment.be  This test is not yet approved or cleared by the [place] FDA and has been authorized for detection and/or diagnosis of SARS-CoV-2 by FDA under an Emergency Use Authorization (EUA). This EUA will remain in effect (meaning this test can be used) for the duration of the COVID-19 declaration under Section 564(b)(1) of the Act, 21 U.S.C. section 360bbb-3(b)(1), unless the authorization is terminated or revoked.  Performed at [facility], [address] [place], [place] [address]     Radiology Studies: No results found.  Scheduled Meds: . dexamethasone (DECADRON) injection  4 mg Intravenous Q6H  . ferrous sulfate  325 mg Oral Q breakfast  . levETIRAcetam  500 mg Oral BID  . pantoprazole  40 mg Oral Daily   Continuous Infusions:   LOS: 4 days    Time spent: 35 mins    Kendarius Vigen, MD [facility]   If 7PM-7AM, please contact  night-coverage

## 2020-09-09 LAB — CBC WITH DIFFERENTIAL/PLATELET
Abs Immature Granulocytes: 0.06 10*3/uL (ref 0.00–0.07)
Basophils Absolute: 0 10*3/uL (ref 0.0–0.1)
Basophils Relative: 0 %
Eosinophils Absolute: 0 10*3/uL (ref 0.0–0.5)
Eosinophils Relative: 0 %
HCT: 28.1 % — ABNORMAL LOW (ref 36.0–46.0)
Hemoglobin: 8.5 g/dL — ABNORMAL LOW (ref 12.0–15.0)
Immature Granulocytes: 1 %
Lymphocytes Relative: 11 %
Lymphs Abs: 0.8 10*3/uL (ref 0.7–4.0)
MCH: 24.6 pg — ABNORMAL LOW (ref 26.0–34.0)
MCHC: 30.2 g/dL (ref 30.0–36.0)
MCV: 81.2 fL (ref 80.0–100.0)
Monocytes Absolute: 0.3 10*3/uL (ref 0.1–1.0)
Monocytes Relative: 4 %
Neutro Abs: 5.8 10*3/uL (ref 1.7–7.7)
Neutrophils Relative %: 84 %
Platelets: 197 10*3/uL (ref 150–400)
RBC: 3.46 MIL/uL — ABNORMAL LOW (ref 3.87–5.11)
RDW: 22.8 % — ABNORMAL HIGH (ref 11.5–15.5)
WBC: 6.9 10*3/uL (ref 4.0–10.5)
nRBC: 0 % (ref 0.0–0.2)

## 2020-09-09 LAB — BASIC METABOLIC PANEL
Anion gap: 9 (ref 5–15)
BUN: 30 mg/dL — ABNORMAL HIGH (ref 8–23)
CO2: 23 mmol/L (ref 22–32)
Calcium: 8.6 mg/dL — ABNORMAL LOW (ref 8.9–10.3)
Chloride: 107 mmol/L (ref 98–111)
Creatinine, Ser: 1.08 mg/dL — ABNORMAL HIGH (ref 0.44–1.00)
GFR, Estimated: 55 mL/min — ABNORMAL LOW (ref >60)
Glucose, Bld: 136 mg/dL — ABNORMAL HIGH (ref 70–99)
Potassium: 4.3 mmol/L (ref 3.5–5.1)
Sodium: 139 mmol/L (ref 135–145)

## 2020-09-09 LAB — PHOSPHORUS: Phosphorus: 3.8 mg/dL (ref 2.5–4.6)

## 2020-09-09 LAB — MAGNESIUM: Magnesium: 1.9 mg/dL (ref 1.7–2.4)

## 2020-09-09 MED ORDER — HYDRALAZINE HCL 20 MG/ML IJ SOLN
10.0000 mg | Freq: Four times a day (QID) | INTRAMUSCULAR | Status: DC | PRN
  Administered 2020-09-09 – 2020-09-13 (×2): 10 mg via INTRAVENOUS
  Filled 2020-09-09 (×2): qty 1

## 2020-09-09 NOTE — Progress Notes (Signed)
Subjective: Patient reports minimal HA now  Objective: Vital signs in last 24 hours: Temp:  [97.5 F (36.4 C)-98.8 F (37.1 C)] 97.6 F (36.4 C) (12/21 1601) Pulse Rate:  [58-70] 62 (12/21 1601) Resp:  [16-19] 18 (12/21 1601) BP: (128-184)/(59-81) 128/59 (12/21 1601) SpO2:  [96 %-98 %] 98 % (12/21 1601)  Intake/Output from previous day: 12/20 0701 - 12/21 0700 In: 1080 [P.O.:1080] Out: 3 [Urine:3] Intake/Output this shift: No intake/output data recorded.  Awake, alert, Ox3.  Cognitive slowing No pronator drift  Lab Results: Recent Labs    09/08/20 0240 09/09/20 0215  WBC 6.5 6.9  HGB 8.0* 8.5*  HCT 27.6* 28.1*  PLT 176 197   BMET Recent Labs    09/08/20 0240 09/09/20 0215  NA 138 139  K 4.3 4.3  CL 106 107  CO2 23 23  GLUCOSE 140* 136*  BUN 32* 30*  CREATININE 1.15* 1.08*  CALCIUM 8.4* 8.6*    Studies/Results: No results found.  Assessment/Plan: Anterior skull base mass - plan for resection Thursday - CT brain lab protocol for intraoperative navigation   [name] Page 09/09/2020, 5:55 PM

## 2020-09-09 NOTE — Care Management Important Message (Signed)
Important Message  Patient Details  Name: [name] Page MRN: [id number] Date of Birth: 11-May-1951   Medicare Important Message Given:  Yes     Orbie Pyo 09/09/2020, 3:04 PM

## 2020-09-09 NOTE — Progress Notes (Addendum)
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  She was recently started on iron supplements for anemia.  Apparently she was scheduled for EGD and colonoscopy on 09/05/20. Her daughter reports she has been confused, forgetful and been complaining about headache recently, Patient reports intermittent headache for almost a year which has recently gotten worse. Head CT showed anterior cranial fossa meningioma causing severe vasogenic edema within the right frontal lobe, leftward midline shift with subfalcine herniation of the right cingulate gyrus with early entrapment of the left lateral ventricle. Neurosurgery Dr. Leward Quan was consulted by ED physician who recommended to initiate Decadron 10 mg daily.  Neurosurgery is planning to do removal of meningioma on 12/23.  She is also found to have a 3 mm kidney stone without significant hydronephrosis,  Dr. Caprice Beaver urologist was consulted,  recommended continue antibiotics.   Assessment & Plan:   Principal Problem:   Meningioma ([place]) Active Problems:   AMS (altered mental status)   Vasogenic Cerebral edema ([facility])   Generalized weakness   Cancer of skin, squamous cell   Nephrolithiasis   UTI (urinary tract infection)   Meningioma: -Patient reports intermittent headache for almost a year which has gotten worse. -CT scan of the head showed right anterior cranial fossa mass concerning for meningioma versus metastasis with significant vasogenic edema and midline shift and herniation. -MRI brain showed a 3.3 X 4.1 x 2.4 cm meningioma at the skull base on the right with mass-effect upon the  inferior right frontal. -Seen by neurosurgeon.  Started on IV dexamethasone and IV Keppra for seizure prophylaxis. -CT chest abdomen and pelvis did not show any evidence of malignancy. -Neurosurgery recommended surgical resection of the mass, tentatively scheduled on 12/23.   Iron deficiency anemia -She was recently found to be anemic and started on iron supplements.Apparently, she was on schedule for EGD and colonoscopy on 09/05/2020.  -Continues to have mild black stools, hemoglobin down to 8.0.  Continue to monitor. -May need inpatient GI consult if hemoglobin continues to drop.  Otherwise needs to reschedule outpatient EGD and colonoscopy post discharge. -Resume iron supplement. Hb remains stable. Denies further black stools.   Squamous cell carcinomavsbasal cell carcinomaof the skin of the chest, right breast and lower back:  Patient's daughter said she had seen Dr. Milinda Antis many years ago for this. However, patient was lost to follow-up and did not go back to see Dr. Milinda Antis. Her daughter said she was not aware that lesions had grown in size. Since there is no dermatologist on service, patient will follow up with dermatologist as an outpatient at discharge.  UTI  -Urinalysis with moderate leukocytosis.  CT abdomen with bilateral nephrolithiasis, nonobstructing right urolithiasis and mild right renal pelvis dilatation without significant hydroureter. -Insignificant growth in urine culture.  Completed 3-day course of IV Rocephin on 12/18. -Patient denies any urinary symptoms.  Generalized tremors -She has generalized resting tremors of all 4 extremities.  Reports for last few months.  Probably related to meningioma and mass-effect secondary to it.    DVT prophylaxis: SCDs Code Status: Partial Code Family Communication: No family at bedside. Disposition Plan:  Status is:  Inpatient  Remains inpatient appropriate because:Inpatient level of care appropriate due to severity of  illness   Dispo: The patient is from: Home              Anticipated d/c is to: Home              Anticipated d/c date is: > 3 days              Patient currently is not medically stable to d/c.   Consultants:   Neurosurgery  Urology  Procedures: Antimicrobials: Anti-infectives (From admission, onward)   Start     Dose/Rate Route Frequency Ordered Stop   09/05/20 1800  cefTRIAXone (ROCEPHIN) 1 g in sodium chloride 0.9 % 100 mL IVPB  Status:  Discontinued        1 g 200 mL/hr over 30 Minutes Intravenous Every 24 hours 09/04/20 2221 09/07/20 1106   09/04/20 1845  cefTRIAXone (ROCEPHIN) 1 g in sodium chloride 0.9 % 100 mL IVPB        1 g 200 mL/hr over 30 Minutes Intravenous  Once 09/04/20 1842 09/04/20 2001      Subjective: Patient was seen and examined at bedside.  Overnight events noted.  Patient reports feeling better.  She reports intermittent headache.  Denies any weakness or numbness.  She is scheduled to have removal of brain mass on 12/23.  Objective: Vitals:   09/08/20 2346 09/09/20 0336 09/09/20 0413 09/09/20 0750  BP: (!) 172/79 (!) 173/75 (!) 178/73 (!) 184/81  Pulse: 61 (!) 58 (!) 59 (!) 59  Resp: 17 19 17 16   Temp: 98 F (36.7 C) (!) 97.5 F (36.4 C) 97.9 F (36.6 C) 97.6 F (36.4 C)  TempSrc:  Oral Oral Oral  SpO2: 97% 96% 97% 96%  Weight:      Height:        Intake/Output Summary (Last 24 hours) at 09/09/2020 1215 Last data filed at 09/09/2020 0530 Gross per 24 hour  Intake 540 ml  Output 1 ml  Net 539 ml   Filed Weights   09/07/20 0433  Weight: 63.9 kg    Examination:  General exam: Appears calm and comfortable, not in any acute distress. Respiratory system: Clear to auscultation. Respiratory effort normal. Cardiovascular system: S1 & S2 heard, RRR. No JVD, murmurs, rubs, gallops or clicks. No pedal edema. Gastrointestinal system: Abdomen is nondistended, soft and nontender. No organomegaly or masses felt. Normal bowel sounds  heard. Central nervous system: Alert and oriented. No focal neurological deficits. Extremities: No edema, no cyanosis, no clubbing. Skin: No rashes, lesions or ulcers Psychiatry: Judgement and insight appear normal. Mood & affect appropriate.     Data Reviewed: I have personally reviewed following labs and imaging studies  CBC: Recent Labs  Lab 09/05/20 0211 09/06/20 0312 09/07/20 0108 09/08/20 0240 09/09/20 0215  WBC 6.6 9.0 7.9 6.5 6.9  NEUTROABS  --  8.1* 6.9 5.5 5.8  HGB 9.0* 7.9* 7.7* 8.0* 8.5*  HCT 31.6* 27.7* 27.0* 27.6* 28.1*  MCV 82.7 82.0 82.1 81.9 81.2  PLT 225 183 186 176 [id number]   Basic Metabolic Panel: Recent Labs  Lab 09/05/20 0211 09/06/20 0312 09/07/20 0108 09/08/20 0240 09/09/20 0215  NA 141 140 137 138 139  K 4.0 3.9 3.9 4.3 4.3  CL 104 108 107 106 107  CO2 24 21* 23 23 23   GLUCOSE 128* 146* 148* 140* 136*  BUN 17 23 31* 32* 30*  CREATININE 1.30* 1.28* 1.29* 1.15* 1.08*  CALCIUM 8.5*  8.0* 8.2* 8.4* 8.6*  MG  --   --   --   --  1.9  PHOS  --   --   --   --  3.8   GFR: Estimated Creatinine Clearance: 42.5 mL/min (A) (by C-G formula based on SCr of 1.08 mg/dL (H)). Liver Function Tests: Recent Labs  Lab 09/04/20 1545  AST 21  ALT 14  ALKPHOS 121  BILITOT 0.7  PROT 7.7  ALBUMIN 3.5   No results for input(s): LIPASE, AMYLASE in the last 168 hours. No results for input(s): AMMONIA in the last 168 hours. Coagulation Profile: Recent Labs  Lab 09/04/20 1545  INR 1.0   Cardiac Enzymes: No results for input(s): CKTOTAL, CKMB, CKMBINDEX, TROPONINI in the last 168 hours. BNP (last 3 results) No results for input(s): PROBNP in the last 8760 hours. HbA1C: No results for input(s): HGBA1C in the last 72 hours. CBG: No results for input(s): GLUCAP in the last 168 hours. Lipid Profile: No results for input(s): CHOL, HDL, LDLCALC, TRIG, CHOLHDL, LDLDIRECT in the last 72 hours. Thyroid Function Tests: No results for input(s): TSH, T4TOTAL, FREET4,  T3FREE, THYROIDAB in the last 72 hours. Anemia Panel: No results for input(s): VITAMINB12, FOLATE, FERRITIN, TIBC, IRON, RETICCTPCT in the last 72 hours. Sepsis Labs: No results for input(s): PROCALCITON, LATICACIDVEN in the last 168 hours.  Recent Results (from the past 240 hour(s))  Urine culture     Status: Abnormal   Collection Time: 09/04/20  5:24 PM   Specimen: Urine, Clean Catch  Result Value Ref Range Status   Specimen Description   Final    URINE, CLEAN CATCH Performed at [facility], [address] [place], [place] [address]    Special Requests   Final    NONE Performed at [facility], [address], [place], [place] [address]    Culture (A)  Final    <10,000 COLONIES/mL INSIGNIFICANT GROWTH Performed at [facility] [address], [place], [place] [address]    Report Status 09/06/2020 FINAL  Final  Resp Panel by RT-PCR (Flu A&B, Covid) Nasopharyngeal Swab     Status: None   Collection Time: 09/04/20  6:47 PM   Specimen: Nasopharyngeal Swab; Nasopharyngeal(NP) swabs in vial transport medium  Result Value Ref Range Status   SARS Coronavirus 2 by RT PCR NEGATIVE NEGATIVE Final    Comment: (NOTE) SARS-CoV-2 target nucleic acids are NOT DETECTED.  The SARS-CoV-2 RNA is generally detectable in upper respiratory specimens during the acute phase of infection. The lowest concentration of SARS-CoV-2 viral copies this assay can detect is 138 copies/mL. A negative result does not preclude SARS-Cov-2 infection and should not be used as the sole basis for treatment or other patient management decisions. A negative result may occur with  improper specimen collection/handling, submission of specimen other than nasopharyngeal swab, presence of viral mutation(s) within the areas targeted by this assay, and inadequate number of viral copies(<138 copies/mL). A negative result must be combined with clinical observations, patient history, and epidemiological information. The  expected result is Negative.  Fact Sheet for Patients:  EntrepreneurPulse.com.au  Fact Sheet for Healthcare Providers:  IncredibleEmployment.be  This test is no t yet approved or cleared by the [place] FDA and  has been authorized for detection and/or diagnosis of SARS-CoV-2 by FDA under an Emergency Use Authorization (EUA). This EUA will remain  in effect (meaning this test can be used) for the duration of the COVID-19 declaration under Section 564(b)(1) of the  Act, 21 U.S.C.section 360bbb-3(b)(1), unless the authorization is terminated  or revoked sooner.       Influenza A by PCR NEGATIVE NEGATIVE Final   Influenza B by PCR NEGATIVE NEGATIVE Final    Comment: (NOTE) The Xpert Xpress SARS-CoV-2/FLU/RSV plus assay is intended as an aid in the diagnosis of influenza from Nasopharyngeal swab specimens and should not be used as a sole basis for treatment. Nasal washings and aspirates are unacceptable for Xpert Xpress SARS-CoV-2/FLU/RSV testing.  Fact Sheet for Patients: EntrepreneurPulse.com.au  Fact Sheet for Healthcare Providers: IncredibleEmployment.be  This test is not yet approved or cleared by the [place] FDA and has been authorized for detection and/or diagnosis of SARS-CoV-2 by FDA under an Emergency Use Authorization (EUA). This EUA will remain in effect (meaning this test can be used) for the duration of the COVID-19 declaration under Section 564(b)(1) of the Act, 21 U.S.C. section 360bbb-3(b)(1), unless the authorization is terminated or revoked.  Performed at [facility], [address] [place], [place] [address]     Radiology Studies: No results found.  Scheduled Meds: . dexamethasone (DECADRON) injection  4 mg Intravenous Q6H  . ferrous sulfate  325 mg Oral Q breakfast  . levETIRAcetam  500 mg Oral BID  . pantoprazole  40 mg Oral Daily   Continuous Infusions:   LOS: 5  days    Time spent: 25 mins    Shawna Clamp, MD [facility]   If 7PM-7AM, please contact night-coverage

## 2020-09-10 ENCOUNTER — Inpatient Hospital Stay (HOSPITAL_COMMUNITY): Payer: Medicare Other

## 2020-09-10 DIAGNOSIS — N183 Chronic kidney disease, stage 3 unspecified: Secondary | ICD-10-CM | POA: Diagnosis present

## 2020-09-10 LAB — ABO/RH: ABO/RH(D): A POS

## 2020-09-10 LAB — BASIC METABOLIC PANEL
Anion gap: 8 (ref 5–15)
BUN: 30 mg/dL — ABNORMAL HIGH (ref 8–23)
CO2: 23 mmol/L (ref 22–32)
Calcium: 8.3 mg/dL — ABNORMAL LOW (ref 8.9–10.3)
Chloride: 105 mmol/L (ref 98–111)
Creatinine, Ser: 1.14 mg/dL — ABNORMAL HIGH (ref 0.44–1.00)
GFR, Estimated: 52 mL/min — ABNORMAL LOW (ref >60)
Glucose, Bld: 133 mg/dL — ABNORMAL HIGH (ref 70–99)
Potassium: 4.4 mmol/L (ref 3.5–5.1)
Sodium: 136 mmol/L (ref 135–145)

## 2020-09-10 LAB — HEMOGLOBIN AND HEMATOCRIT, BLOOD
HCT: 28.7 % — ABNORMAL LOW (ref 36.0–46.0)
Hemoglobin: 8.5 g/dL — ABNORMAL LOW (ref 12.0–15.0)

## 2020-09-10 LAB — MRSA PCR SCREENING: MRSA by PCR: NEGATIVE

## 2020-09-10 IMAGING — CT CT HEAD W/O CM
1 of 2 series · 15 of 30 positions shown, 19 images · non-contrast
Comparison: Previous MRI from [DATE].

CLINICAL DATA: Initial evaluation for brain mass, BrainLAB protocol
for surgical planning.

EXAM:
CT HEAD WITHOUT CONTRAST
TECHNIQUE: Contiguous axial images were obtained from the base of the skull
through the vertex without intravenous contrast.

[Series 3: head 1.0 h30s · axial · 0.49mm/px · z∈[-209,-43]mm · 15 of 182 slices shown, 19 images]
[im 8/182  brain]
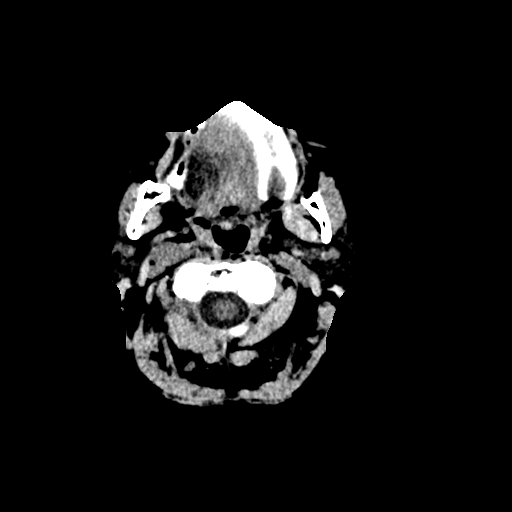
[im 8/182  bone]
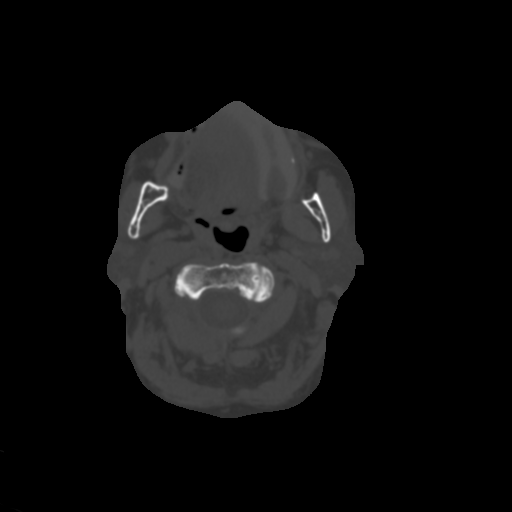
[im 23/182  brain]
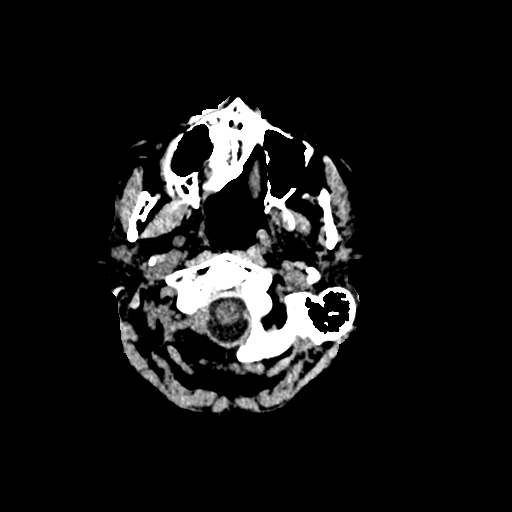
[im 31/182  brain]
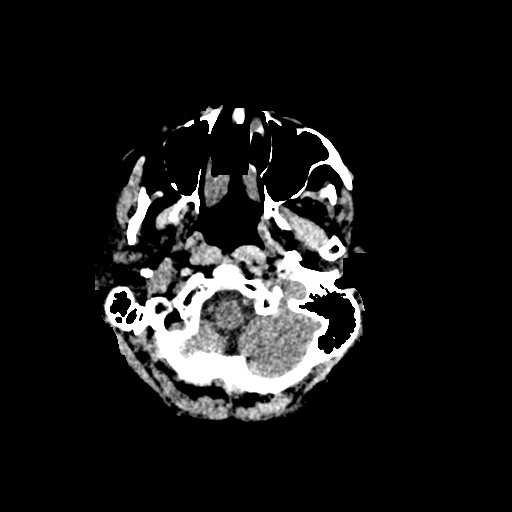
[im 46/182  brain]
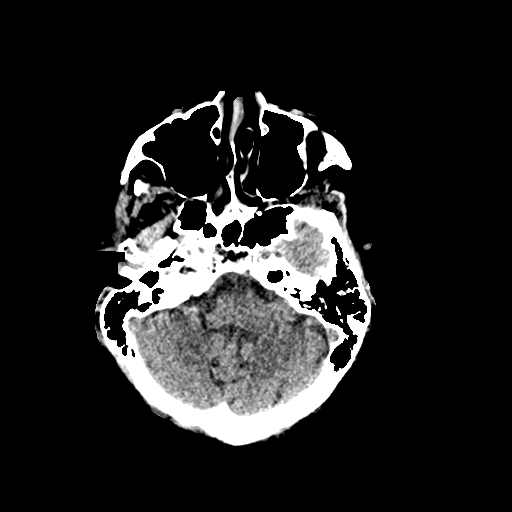
[im 53/182  brain]
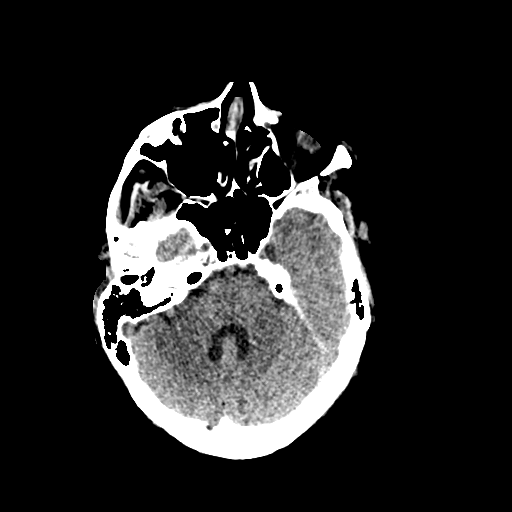
[im 53/182  bone]
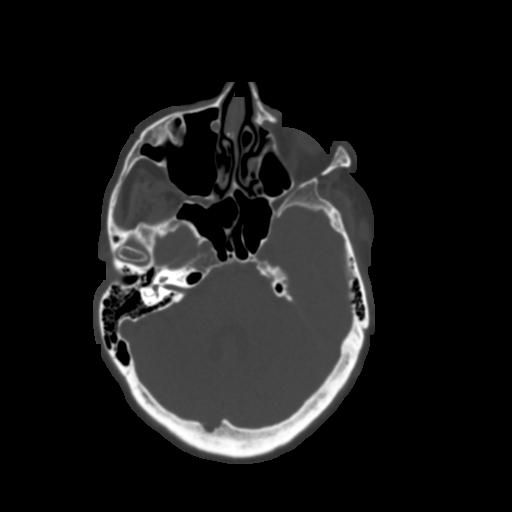
[im 68/182  brain]
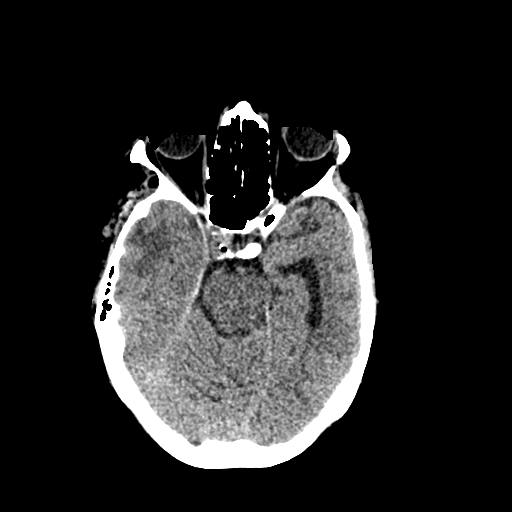
[im 76/182  brain]
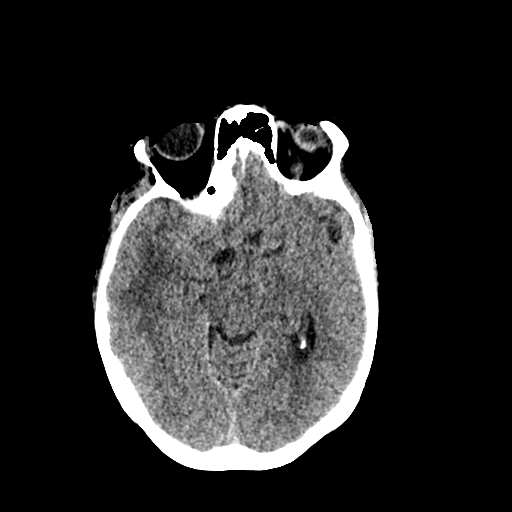
[im 91/182  brain]
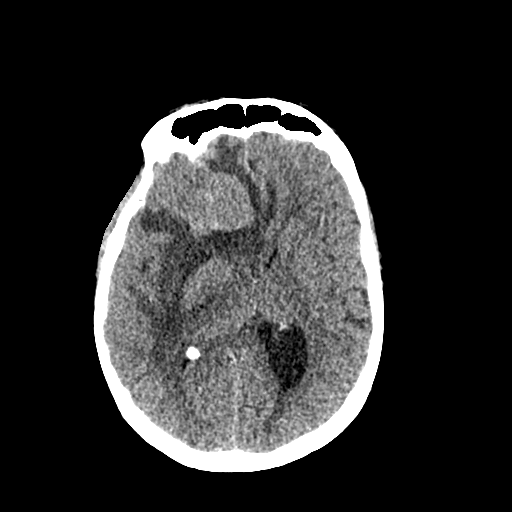
[im 106/182  brain]
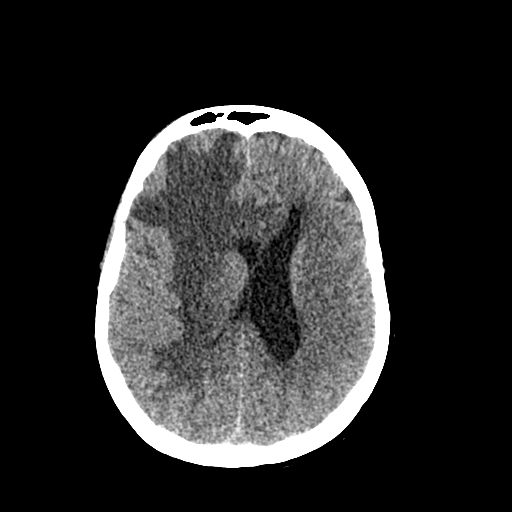
[im 106/182  bone]
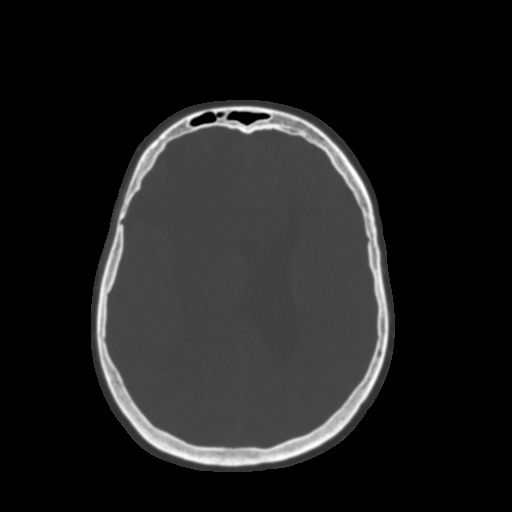
[im 114/182  brain]
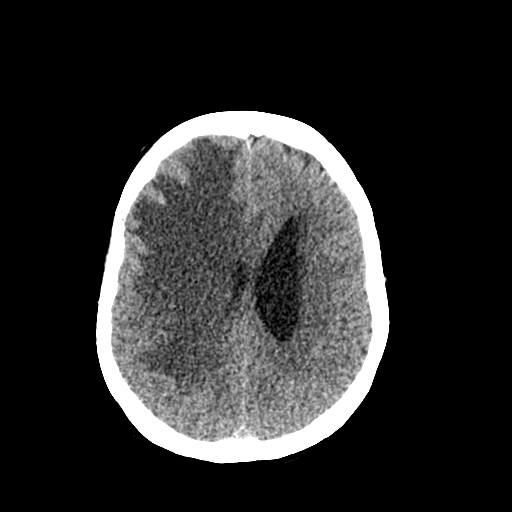
[im 129/182  brain]
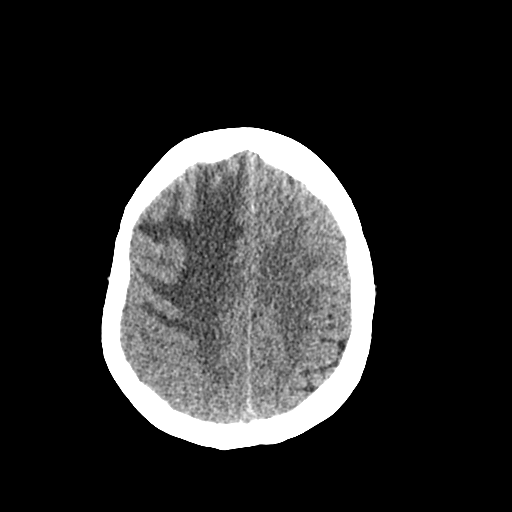
[im 136/182  brain]
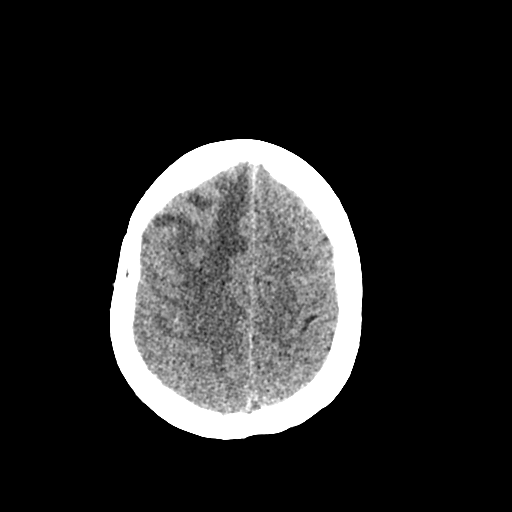
[im 151/182  brain]
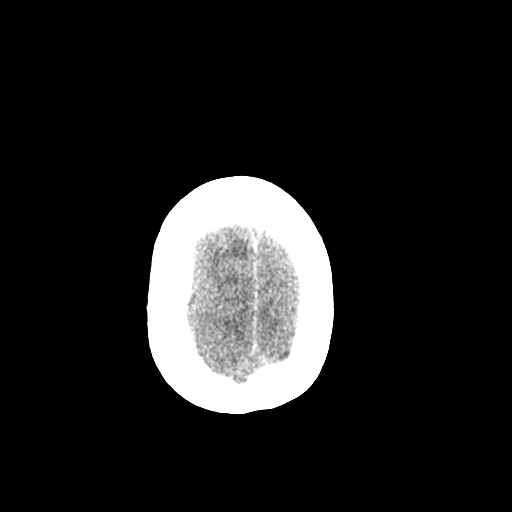
[im 151/182  bone]
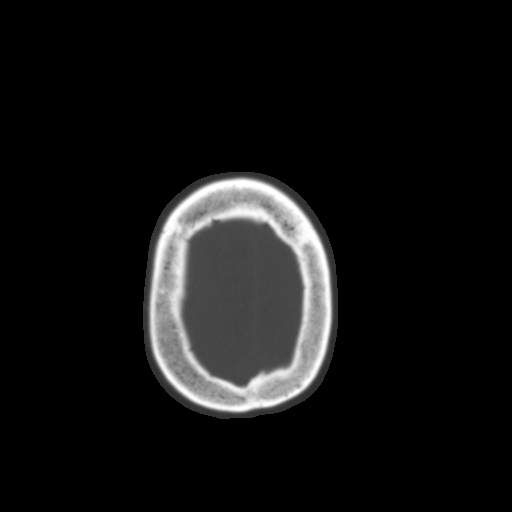
[im 159/182  brain]
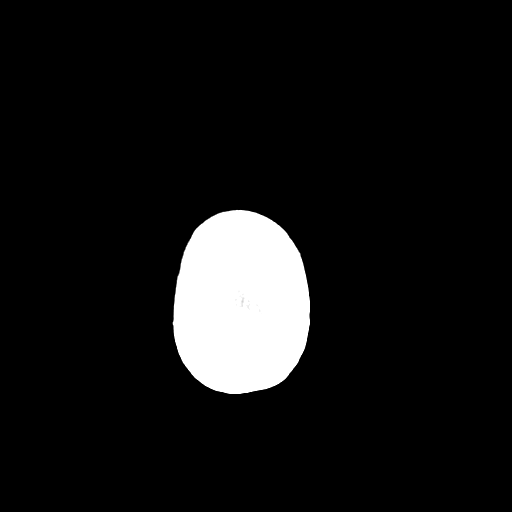
[im 174/182  brain]
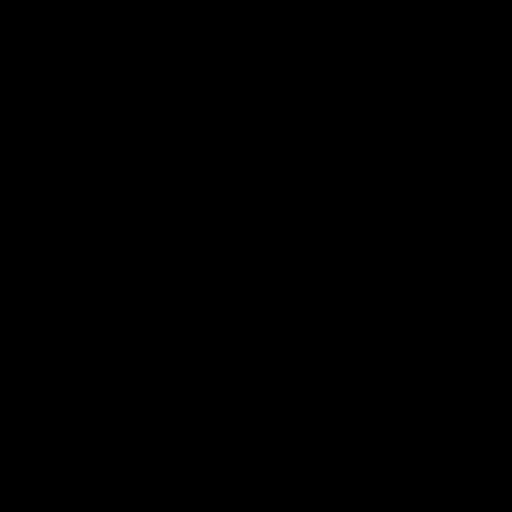

[15 of 30 positions shown; findings below may reference images not displayed]

FINDINGS: Brain: Large meningioma positioned at the anterior right skull base
again seen stable in size and appearance measuring up to 4 cm in
diameter on this exam. Surrounding vasogenic edema and regional mass
effect within the adjacent right cerebral hemisphere is unchanged.
Right lateral ventricle partially effaced. Up to 9 mm right-to-left
shift at the septum pellucidum with asymmetric dilatation of the
left lateral ventricle, stable.

No acute intracranial hemorrhage. No visible acute large vessel
territory infarct. No other mass lesion or extra-axial fluid
collection.

Vascular: No abnormal hyperdense vessel.

Skull: Scalp soft tissues within normal limits.  Calvarium intact.

Sinuses/Orbits: Globes and orbital soft tissues demonstrate no acute
finding. Paranasal sinuses are clear. No mastoid effusion.

Other: None.
IMPRESSION: 1. Brain Stealth protocol for preoperative planning and guidance.
2. Large approximate 4 cm meningioma at the anterior right skull
base with associated regional mass effect right-to-left midline
shift, stable.
3. No other acute intracranial abnormality.

## 2020-09-10 MED ORDER — CHLORHEXIDINE GLUCONATE CLOTH 2 % EX PADS
6.0000 | MEDICATED_PAD | Freq: Once | CUTANEOUS | Status: AC
  Administered 2020-09-10: 23:00:00 6 via TOPICAL

## 2020-09-10 MED ORDER — CEFAZOLIN SODIUM-DEXTROSE 2-4 GM/100ML-% IV SOLN: 2.0000 g | INTRAVENOUS | Status: DC

## 2020-09-10 MED ORDER — CHLORHEXIDINE GLUCONATE CLOTH 2 % EX PADS
6.0000 | MEDICATED_PAD | Freq: Once | CUTANEOUS | Status: AC
  Administered 2020-09-11: 06:00:00 6 via TOPICAL

## 2020-09-10 NOTE — Anesthesia Preprocedure Evaluation (Addendum)
Anesthesia Evaluation  Patient identified by MRN, date of birth, ID band Patient awake    Reviewed: Allergy & Precautions, NPO status , Patient's Chart, lab work & pertinent test results  History of Anesthesia Complications Negative for: history of anesthetic complications  Airway Mallampati: II  TM Distance: >3 FB Neck ROM: Full    Dental no notable dental hx. (+) Dental Advisory Given   Pulmonary neg pulmonary ROS,    Pulmonary exam normal        Cardiovascular negative cardio ROS Normal cardiovascular exam     Neuro/Psych Meningioma    GI/Hepatic negative GI ROS, Neg liver ROS,   Endo/Other  negative endocrine ROS  Renal/GU Renal InsufficiencyRenal disease     Musculoskeletal negative musculoskeletal ROS (+)   Abdominal   Peds  Hematology negative hematology ROS (+)   Anesthesia Other Findings Squamous cell carcinoma  Reproductive/Obstetrics                            Anesthesia Physical Anesthesia Plan  ASA: IV  Anesthesia Plan: General   Post-op Pain Management:    Induction: Intravenous  PONV Risk Score and Plan: 4 or greater and Ondansetron and Treatment may vary due to age or medical condition  Airway Management Planned: Oral ETT  Additional Equipment: Arterial line  Intra-op Plan:   Post-operative Plan: Possible Post-op intubation/ventilation  Informed Consent: I have reviewed the patients History and Physical, chart, labs and discussed the procedure including the risks, benefits and alternatives for the proposed anesthesia with the patient or authorized representative who has indicated his/her understanding and acceptance.     Dental advisory given  Plan Discussed with: Anesthesiologist and CRNA  Anesthesia Plan Comments:        Anesthesia Quick Evaluation

## 2020-09-10 NOTE — Progress Notes (Signed)
Subjective: Patient reports intermittent mild headache  Objective: Vital signs in last 24 hours: Temp:  [97.8 F (36.6 C)-98.4 F (36.9 C)] 98.2 F (36.8 C) (12/22 2001) Pulse Rate:  [59-64] 63 (12/22 2001) Resp:  [18] 18 (12/22 2001) BP: (137-165)/(63-80) 137/63 (12/22 2001) SpO2:  [95 %-97 %] 95 % (12/22 2001)  Intake/Output from previous day: 12/21 0701 - 12/22 0700 In: 340 [P.O.:340] Out: -  Intake/Output this shift: No intake/output data recorded.  NAD VFC No pronator drift A+Ox3  Lab Results: Recent Labs    09/08/20 0240 09/09/20 0215 09/10/20 0246  WBC 6.5 6.9  --   HGB 8.0* 8.5* 8.5*  HCT 27.6* 28.1* 28.7*  PLT 176 197  --    BMET Recent Labs    09/09/20 0215 09/10/20 0246  NA 139 136  K 4.3 4.4  CL 107 105  CO2 23 23  GLUCOSE 136* 133*  BUN 30* 30*  CREATININE 1.08* 1.14*  CALCIUM 8.6* 8.3*    Studies/Results: CT HEAD WO CONTRAST  Result Date: 09/10/2020 CLINICAL DATA:  Initial evaluation for brain mass, BrainLAB protocol for surgical planning. EXAM: CT HEAD WITHOUT CONTRAST TECHNIQUE: Contiguous axial images were obtained from the base of the skull through the vertex without intravenous contrast. COMPARISON:  Previous MRI from 09/05/2020. FINDINGS: Brain: Large meningioma positioned at the anterior right skull base again seen stable in size and appearance measuring up to 4 cm in diameter on this exam. Surrounding vasogenic edema and regional mass effect within the adjacent right cerebral hemisphere is unchanged. Right lateral ventricle partially effaced. Up to 9 mm right-to-left shift at the septum pellucidum with asymmetric dilatation of the left lateral ventricle, stable. No acute intracranial hemorrhage. No visible acute large vessel territory infarct. No other mass lesion or extra-axial fluid collection. Vascular: No abnormal hyperdense vessel. Skull: Scalp soft tissues within normal limits.  Calvarium intact. Sinuses/Orbits: Globes and orbital  soft tissues demonstrate no acute finding. Paranasal sinuses are clear. No mastoid effusion. Other: None. IMPRESSION: 1. Brain Stealth protocol for preoperative planning and guidance. 2. Large approximate 4 cm meningioma at the anterior right skull base with associated regional mass effect right-to-left midline shift, stable. 3. No other acute intracranial abnormality. Electronically Signed   By: Jeannine Boga M.D.   On: 09/10/2020 02:12    Assessment/Plan: 69 yo F with anterior skull base mass - plan for resection tomorrow -  I discussed in detail the procedure with the patient and daughter at the bedside.  Possibility of craniectomy, ventricular or lumbar drain placement, and central line placement was also discussed. Risks, benefits, alternatives, and expected convalescence was discussed.  Risks discussed included but were not limited to bleeding, pain, infection, seizure, stroke, scar, recurrence, cerebrospinal fluid leak, neurologic deficit, paralysis, coma and death.  I also discussed with her the possibility of blood transfusion during surgery and consent to transfuse blood products if necessary was also obtained.  All questions and concerns were answered and understanding and agreement with the plan was verbalized.    Vallarie Mare 09/10/2020, 8:16 PM

## 2020-09-10 NOTE — Progress Notes (Addendum)
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  She was recently started on iron supplements for anemia.  Apparently she was scheduled for EGD and colonoscopy on 09/05/20.  Her daughter reports she has been confused, forgetful and been complaining about headache recently, Patient reports intermittent headache for almost a year which has recently gotten worse.  Head CT showed anterior cranial fossa meningioma causing severe vasogenic edema within the right frontal lobe, leftward midline shift with subfalcine herniation of the right cingulate gyrus with early entrapment of the left lateral ventricle.  Neurosurgery was consulted by ED physician who recommended to initiate Decadron 10 mg daily and is planning to do removal of meningioma on 12/23.  She is also found to have a 3 mm stone without significant hydronephrosis,  Dr. Caprice Beaver urologist was consulted,  recommended continue antibiotics.  Assessment/Plan: Principal Problem:   Meningioma [facility]): Looks to be causing her intermittent headaches for the past year.  MRI and CT noted large meningioma causing mass-effect.  Started on IV dexamethasone and Keppra.  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     Cancer of skin, squamous cell: Squamous cell versus basal cell carcinoma of skin of chest, right breast and lower back.  Initially had been seeing a dermatologist, but since then had stopped following up.  Daughter was unaware lesions  had grown in size and plan will be for patient to follow-up with dermatology as outpatient.  Dermatology does not come into the hospital.   Stage IIIa chronic kidney disease: Looks to be at baseline    UTI (urinary tract infection): Urinalysis with moderate leukocytosis and CT of abdomen bilateral renal stones, but none obstruction without significant hydroureter.  Completed 3-day course of IV Rocephin.  Insignificant growth in culture.  Discussed with urology  Iron deficiency anemia: Recently found to be anemic and started on iron supplement.  Was supposed to have EGD and colonoscopy done on 12/17.  Hemoglobin down to 8, but since then has come up on its own.  Plan is to reschedule outpatient scope although may need inpatient consultation if hemoglobin has dropped.  Code Status: Partial code  Family Communication: Left message for daughter  Disposition Plan: Anticipate discharge to home   Consultants:  Neurosurgery  Urology  Procedures:  Planned meningioma resection 12/23  Antimicrobials:  IV Rocephin 12/16-12/19  DVT prophylaxis: SCDs   Objective: Vitals:   09/10/20 0744 09/10/20 1100  BP: (!) 165/79 (!) 155/70  Pulse: 61 (!) 59  Resp: 18 18  Temp: 97.9 F (36.6 C) 98.1 F (36.7 C)  SpO2: 97% 96%    Intake/Output Summary (Last 24 hours) at 09/10/2020 1606 Last data filed at 09/10/2020 1300 Gross per 24 hour  Intake 660 ml  Output --  Net 660 ml   Filed Weights   09/07/20 0433  Weight: 63.9 kg   Body mass index is 25.77 kg/m.  Exam:   General: Resting comfortably, oriented x1  HEENT: Normocephalic and atraumatic, mucous membranes are slightly dry  Cardiovascular: Regular rate rhythm, S1-S2  Respiratory: Clear to auscultation bilaterally  Abdomen: Soft, nontender, nondistended, positive bowel sounds  Musculoskeletal: No clubbing or cyanosis or edema  Skin: Noted multiple basal cell carcinomas on chest  Psychiatry: Acutely confused   Data  Reviewed: CBC: Recent Labs  Lab 09/05/20 0211 09/06/20 0312 09/07/20 0108 09/08/20 0240 09/09/20 0215 09/10/20 0246  WBC 6.6 9.0 7.9 6.5 6.9  --   NEUTROABS  --  8.1* 6.9 5.5 5.8  --   HGB 9.0* 7.9* 7.7* 8.0* 8.5* 8.5*  HCT 31.6* 27.7* 27.0* 27.6* 28.1* 28.7*  MCV 82.7 82.0 82.1 81.9 81.2  --   PLT 225 183 186 176 197  --    Basic Metabolic Panel: Recent Labs  Lab 09/06/20 0312 09/07/20 0108 09/08/20 0240 09/09/20 0215 09/10/20 0246  NA 140 137 138 139 136  K 3.9 3.9 4.3 4.3 4.4  CL 108 107 106 107 105  CO2 21* 23 23 23 23   GLUCOSE 146* 148* 140* 136* 133*  BUN 23 31* 32* 30* 30*  CREATININE 1.28* 1.29* 1.15* 1.08* 1.14*  CALCIUM 8.0* 8.2* 8.4* 8.6* 8.3*  MG  --   --   --  1.9  --   PHOS  --   --   --  3.8  --    GFR: Estimated Creatinine Clearance: 40.3 mL/min (A) (by C-G formula based on SCr of 1.14 mg/dL (H)). Liver Function Tests: Recent Labs  Lab 09/04/20 1545  AST 21  ALT 14  ALKPHOS 121  BILITOT 0.7  PROT 7.7  ALBUMIN 3.5   No results for input(s): LIPASE, AMYLASE in the last 168 hours. No results for input(s): AMMONIA in the last 168 hours. Coagulation Profile: Recent Labs  Lab 09/04/20 1545  INR 1.0   Cardiac Enzymes: No results for input(s): CKTOTAL, CKMB, CKMBINDEX, TROPONINI in the last 168 hours. BNP (last 3 results) No results for input(s): PROBNP in the last 8760 hours. HbA1C: No results for input(s): HGBA1C in the last 72 hours. CBG: No results for input(s): GLUCAP in the last 168 hours. Lipid Profile: No results for input(s): CHOL, HDL, LDLCALC, TRIG, CHOLHDL, LDLDIRECT in the last 72 hours. Thyroid Function Tests: No results for input(s): TSH, T4TOTAL, FREET4, T3FREE, THYROIDAB in the last 72 hours. Anemia Panel: No results for input(s): VITAMINB12, FOLATE, FERRITIN, TIBC, IRON, RETICCTPCT in the last 72 hours. Urine analysis:    Component Value Date/Time   COLORURINE YELLOW 09/04/2020 1659   APPEARANCEUR HAZY (A) 09/04/2020  1659   LABSPEC 1.016 09/04/2020 1659   PHURINE 6.0 09/04/2020 1659   GLUCOSEU NEGATIVE 09/04/2020 1659   HGBUR LARGE (A) 09/04/2020 1659   BILIRUBINUR NEGATIVE 09/04/2020 1659   KETONESUR 20 (A) 09/04/2020 1659   PROTEINUR 100 (A) 09/04/2020 1659   UROBILINOGEN 0.2 07/04/2014 0406   NITRITE NEGATIVE 09/04/2020 1659   LEUKOCYTESUR MODERATE (A) 09/04/2020 1659   Sepsis Labs: @LABRCNTIP (procalcitonin:4,lacticidven:4)  ) Recent Results (from the past 240 hour(s))  Urine culture     Status: Abnormal   Collection Time: 09/04/20  5:24 PM   Specimen: Urine, Clean Catch  Result Value Ref Range Status   Specimen Description   Final    URINE, CLEAN CATCH Performed at [facility], [address] [place], [place] [address]    Special Requests   Final    NONE Performed at [facility], [address], [place], [place] [address]    Culture (A)  Final    <10,000 COLONIES/mL INSIGNIFICANT GROWTH Performed at [facility], [address] [place], [place] [address]    Report Status 09/06/2020 FINAL  Final  Resp Panel by RT-PCR (Flu A&B, Covid) Nasopharyngeal Swab     Status: None   Collection Time: 09/04/20  6:47 PM   Specimen: Nasopharyngeal Swab; Nasopharyngeal(NP) swabs in vial transport medium  Result Value Ref Range Status   SARS Coronavirus 2 by RT PCR NEGATIVE NEGATIVE Final    Comment: (NOTE) SARS-CoV-2 target nucleic acids are NOT DETECTED.  The SARS-CoV-2 RNA is generally detectable in upper respiratory specimens during the acute phase of infection. The lowest concentration of SARS-CoV-2 viral copies this assay can detect is 138 copies/mL. A negative result does not preclude SARS-Cov-2 infection and should not be used as the sole basis for treatment or other patient management decisions. A negative result may occur with  improper specimen collection/handling, submission of specimen other than nasopharyngeal swab, presence of viral mutation(s) within the areas targeted  by this assay, and inadequate number of viral copies(<138 copies/mL). A negative result must be combined with clinical observations, patient history, and epidemiological information. The expected result is Negative.  Fact Sheet for Patients:  EntrepreneurPulse.com.au  Fact Sheet for Healthcare Providers:  IncredibleEmployment.be  This test is no t yet approved or cleared by the [place] FDA and  has been authorized for detection and/or diagnosis of SARS-CoV-2 by FDA under an Emergency Use Authorization (EUA). This EUA will remain  in effect (meaning this test can be used) for the duration of the COVID-19 declaration under Section 564(b)(1) of the Act, 21 U.S.C.section 360bbb-3(b)(1), unless the authorization is terminated  or revoked sooner.       Influenza A by PCR NEGATIVE NEGATIVE Final   Influenza B by PCR NEGATIVE NEGATIVE Final    Comment: (NOTE) The Xpert Xpress SARS-CoV-2/FLU/RSV plus assay is intended as an aid in the diagnosis of influenza from Nasopharyngeal swab specimens and should not be used as a sole basis for treatment. Nasal washings and aspirates are unacceptable for Xpert Xpress SARS-CoV-2/FLU/RSV testing.  Fact Sheet for Patients: EntrepreneurPulse.com.au  Fact Sheet for Healthcare Providers: IncredibleEmployment.be  This test is not yet approved or cleared by the [place] FDA and has been authorized for detection and/or diagnosis of SARS-CoV-2 by FDA under an Emergency Use Authorization (EUA). This EUA will remain in effect (meaning this test can be used) for the duration of the COVID-19 declaration under Section 564(b)(1) of the Act, 21 U.S.C. section 360bbb-3(b)(1), unless the authorization is terminated or revoked.  Performed at [facility], [address] [place], [place] [address]       Studies: CT HEAD WO CONTRAST  Result Date: 09/10/2020 CLINICAL DATA:   Initial evaluation for brain mass, BrainLAB protocol for surgical planning. EXAM: CT HEAD WITHOUT CONTRAST TECHNIQUE: Contiguous axial images were obtained from the base of the skull through the vertex without intravenous contrast. COMPARISON:  Previous MRI from 09/05/2020. FINDINGS: Brain: Large meningioma positioned at the anterior right skull base again seen stable in size and appearance measuring up to 4 cm in diameter on this exam. Surrounding vasogenic edema and regional mass effect within the adjacent right cerebral hemisphere is unchanged. Right lateral ventricle partially effaced. Up to 9 mm right-to-left shift at the septum pellucidum with asymmetric dilatation of the left lateral ventricle, stable. No acute intracranial hemorrhage. No visible acute large vessel territory infarct. No other mass lesion or extra-axial fluid collection. Vascular: No abnormal hyperdense vessel. Skull: Scalp soft tissues within normal limits.  Calvarium intact. Sinuses/Orbits: Globes and  orbital soft tissues demonstrate no acute finding. Paranasal sinuses are clear. No mastoid effusion. Other: None. IMPRESSION: 1. Brain Stealth protocol for preoperative planning and guidance. 2. Large approximate 4 cm meningioma at the anterior right skull base with associated regional mass effect right-to-left midline shift, stable. 3. No other acute intracranial abnormality. Electronically Signed   By: Jeannine Boga M.D.   On: 09/10/2020 02:12    Scheduled Meds: . dexamethasone (DECADRON) injection  4 mg Intravenous Q6H  . ferrous sulfate  325 mg Oral Q breakfast  . levETIRAcetam  500 mg Oral BID  . pantoprazole  40 mg Oral Daily    Continuous Infusions:   LOS: 6 days     Annita Brod, MD [facility]   09/10/2020, 4:06 PM

## 2020-09-11 ENCOUNTER — Other Ambulatory Visit: Payer: Self-pay | Admitting: Family Medicine

## 2020-09-11 ENCOUNTER — Inpatient Hospital Stay (HOSPITAL_COMMUNITY): Payer: Medicare Other | Admitting: Anesthesiology

## 2020-09-11 ENCOUNTER — Inpatient Hospital Stay (HOSPITAL_COMMUNITY)
Admission: EM | Disposition: A | Discharge status: Rehabilitation Facility | Payer: Self-pay | Source: Home / Self Care | Attending: Internal Medicine

## 2020-09-11 ENCOUNTER — Other Ambulatory Visit: Payer: Self-pay

## 2020-09-11 DIAGNOSIS — M810 Age-related osteoporosis without current pathological fracture: Secondary | ICD-10-CM

## 2020-09-11 HISTORY — PX: APPLICATION OF CRANIAL NAVIGATION: SHX6578

## 2020-09-11 HISTORY — PX: CRANIOTOMY: SHX93

## 2020-09-11 LAB — POCT I-STAT 7, (LYTES, BLD GAS, ICA,H+H)
Acid-Base Excess: 0 mmol/L (ref 0.0–2.0)
Acid-Base Excess: 0 mmol/L (ref 0.0–2.0)
Acid-Base Excess: 0 mmol/L (ref 0.0–2.0)
Acid-base deficit: 1 mmol/L (ref 0.0–2.0)
Bicarbonate: 22.9 mmol/L (ref 20.0–28.0)
Bicarbonate: 22.4 mmol/L (ref 20.0–28.0)
Bicarbonate: 24 mmol/L (ref 20.0–28.0)
Bicarbonate: 23.9 mmol/L (ref 20.0–28.0)
Calcium, Ion: 1.09 mmol/L — ABNORMAL LOW (ref 1.15–1.40)
Calcium, Ion: 1.08 mmol/L — ABNORMAL LOW (ref 1.15–1.40)
Calcium, Ion: 1.06 mmol/L — ABNORMAL LOW (ref 1.15–1.40)
Calcium, Ion: 1.03 mmol/L — ABNORMAL LOW (ref 1.15–1.40)
HCT: 27 % — ABNORMAL LOW (ref 36.0–46.0)
HCT: 31 % — ABNORMAL LOW (ref 36.0–46.0)
HCT: 29 % — ABNORMAL LOW (ref 36.0–46.0)
HCT: 28 % — ABNORMAL LOW (ref 36.0–46.0)
Hemoglobin: 9.2 g/dL — ABNORMAL LOW (ref 12.0–15.0)
Hemoglobin: 10.5 g/dL — ABNORMAL LOW (ref 12.0–15.0)
Hemoglobin: 9.9 g/dL — ABNORMAL LOW (ref 12.0–15.0)
Hemoglobin: 9.5 g/dL — ABNORMAL LOW (ref 12.0–15.0)
O2 Saturation: 100 %
O2 Saturation: 100 %
O2 Saturation: 100 %
O2 Saturation: 100 %
Patient temperature: 34.9
Patient temperature: 35.3
Patient temperature: 35.9
Patient temperature: 36.6
Potassium: 4.4 mmol/L (ref 3.5–5.1)
Potassium: 4.5 mmol/L (ref 3.5–5.1)
Potassium: 4.3 mmol/L (ref 3.5–5.1)
Potassium: 4.4 mmol/L (ref 3.5–5.1)
Sodium: 133 mmol/L — ABNORMAL LOW (ref 135–145)
Sodium: 135 mmol/L (ref 135–145)
Sodium: 136 mmol/L (ref 135–145)
Sodium: 138 mmol/L (ref 135–145)
TCO2: 24 mmol/L (ref 22–32)
TCO2: 23 mmol/L (ref 22–32)
TCO2: 25 mmol/L (ref 22–32)
TCO2: 25 mmol/L (ref 22–32)
pCO2 arterial: 31.2 mmHg — ABNORMAL LOW (ref 32.0–48.0)
pCO2 arterial: 27.8 mmHg — ABNORMAL LOW (ref 32.0–48.0)
pCO2 arterial: 32.3 mmHg (ref 32.0–48.0)
pCO2 arterial: 32.8 mmHg (ref 32.0–48.0)
pH, Arterial: 7.466 — ABNORMAL HIGH (ref 7.350–7.450)
pH, Arterial: 7.508 — ABNORMAL HIGH (ref 7.350–7.450)
pH, Arterial: 7.475 — ABNORMAL HIGH (ref 7.350–7.450)
pH, Arterial: 7.469 — ABNORMAL HIGH (ref 7.350–7.450)
pO2, Arterial: 170 mmHg — ABNORMAL HIGH (ref 83.0–108.0)
pO2, Arterial: 201 mmHg — ABNORMAL HIGH (ref 83.0–108.0)
pO2, Arterial: 194 mmHg — ABNORMAL HIGH (ref 83.0–108.0)
pO2, Arterial: 205 mmHg — ABNORMAL HIGH (ref 83.0–108.0)

## 2020-09-11 LAB — PREPARE RBC (CROSSMATCH)

## 2020-09-11 SURGERY — CRANIOTOMY TUMOR EXCISION
Anesthesia: General | Site: Head | Laterality: Right

## 2020-09-11 MED ORDER — BACITRACIN ZINC 500 UNIT/GM EX OINT
TOPICAL_OINTMENT | CUTANEOUS | Status: AC
  Filled 2020-09-11: qty 28.35

## 2020-09-11 MED ORDER — NICARDIPINE HCL IN NACL 20-0.86 MG/200ML-% IV SOLN
INTRAVENOUS | Status: AC
  Filled 2020-09-11: qty 200

## 2020-09-11 MED ORDER — FENTANYL CITRATE (PF) 250 MCG/5ML IJ SOLN
INTRAMUSCULAR | Status: AC
  Filled 2020-09-11: qty 5

## 2020-09-11 MED ORDER — HYDROCODONE-ACETAMINOPHEN 5-325 MG PO TABS
1.0000 | ORAL_TABLET | ORAL | Status: DC | PRN
  Administered 2020-09-11: 1 via ORAL
  Filled 2020-09-11: qty 1

## 2020-09-11 MED ORDER — ROCURONIUM BROMIDE 10 MG/ML (PF) SYRINGE
PREFILLED_SYRINGE | INTRAVENOUS | Status: DC | PRN
  Administered 2020-09-11: 40 mg via INTRAVENOUS
  Administered 2020-09-11 (×2): 20 mg via INTRAVENOUS
  Administered 2020-09-11: 80 mg via INTRAVENOUS
  Administered 2020-09-11 (×2): 20 mg via INTRAVENOUS

## 2020-09-11 MED ORDER — HEMOSTATIC AGENTS (NO CHARGE) OPTIME
TOPICAL | Status: DC | PRN
  Administered 2020-09-11 (×2): 1 via TOPICAL

## 2020-09-11 MED ORDER — LIDOCAINE 2% (20 MG/ML) 5 ML SYRINGE
INTRAMUSCULAR | Status: AC
  Filled 2020-09-11: qty 5

## 2020-09-11 MED ORDER — LACTATED RINGERS IV SOLN: INTRAVENOUS | Status: DC | PRN

## 2020-09-11 MED ORDER — FENTANYL CITRATE (PF) 100 MCG/2ML IJ SOLN
INTRAMUSCULAR | Status: AC
  Filled 2020-09-11: qty 2

## 2020-09-11 MED ORDER — FENTANYL CITRATE (PF) 250 MCG/5ML IJ SOLN
INTRAMUSCULAR | Status: DC | PRN
  Administered 2020-09-11 (×4): 50 ug via INTRAVENOUS
  Administered 2020-09-11: 25 ug via INTRAVENOUS
  Administered 2020-09-11: 100 ug via INTRAVENOUS
  Administered 2020-09-11 (×2): 50 ug via INTRAVENOUS
  Administered 2020-09-11: 25 ug via INTRAVENOUS
  Administered 2020-09-11: 50 ug via INTRAVENOUS

## 2020-09-11 MED ORDER — CEFAZOLIN SODIUM-DEXTROSE 1-4 GM/50ML-% IV SOLN
1.0000 g | Freq: Three times a day (TID) | INTRAVENOUS | Status: AC
  Administered 2020-09-11 – 2020-09-12 (×3): 1 g via INTRAVENOUS
  Filled 2020-09-11 (×3): qty 50

## 2020-09-11 MED ORDER — ALBUMIN HUMAN 5 % IV SOLN: INTRAVENOUS | Status: DC | PRN

## 2020-09-11 MED ORDER — SODIUM CHLORIDE 0.9 % IV SOLN: INTRAVENOUS | Status: DC | PRN

## 2020-09-11 MED ORDER — ONDANSETRON HCL 4 MG PO TABS: 4.0000 mg | ORAL_TABLET | ORAL | Status: DC | PRN

## 2020-09-11 MED ORDER — THROMBIN 20000 UNITS EX SOLR: CUTANEOUS | Status: DC | PRN

## 2020-09-11 MED ORDER — 0.9 % SODIUM CHLORIDE (POUR BTL) OPTIME
TOPICAL | Status: DC | PRN
  Administered 2020-09-11 (×3): 1000 mL

## 2020-09-11 MED ORDER — MANNITOL 25 % IV SOLN: INTRAVENOUS | Status: DC | PRN

## 2020-09-11 MED ORDER — PHENYLEPHRINE HCL-NACL 10-0.9 MG/250ML-% IV SOLN
INTRAVENOUS | Status: DC | PRN
  Administered 2020-09-11: 25 ug/min via INTRAVENOUS

## 2020-09-11 MED ORDER — PROPOFOL 10 MG/ML IV BOLUS
INTRAVENOUS | Status: AC
  Filled 2020-09-11: qty 20

## 2020-09-11 MED ORDER — THROMBIN 5000 UNITS EX SOLR
CUTANEOUS | Status: AC
  Filled 2020-09-11: qty 5000

## 2020-09-11 MED ORDER — ONDANSETRON HCL 4 MG/2ML IJ SOLN
INTRAMUSCULAR | Status: DC | PRN
  Administered 2020-09-11: 4 mg via INTRAVENOUS

## 2020-09-11 MED ORDER — FLEET ENEMA 7-19 GM/118ML RE ENEM: 1.0000 | ENEMA | Freq: Once | RECTAL | Status: DC | PRN

## 2020-09-11 MED ORDER — DEXAMETHASONE SODIUM PHOSPHATE 10 MG/ML IJ SOLN
INTRAMUSCULAR | Status: DC | PRN
  Administered 2020-09-11: 10 mg via INTRAVENOUS

## 2020-09-11 MED ORDER — NICARDIPINE HCL IN NACL 20-0.86 MG/200ML-% IV SOLN
3.0000 mg/h | INTRAVENOUS | Status: DC
  Administered 2020-09-11: 16:00:00 5 mg/h via INTRAVENOUS
  Administered 2020-09-11: 21:00:00 3 mg/h via INTRAVENOUS
  Administered 2020-09-12: 03:00:00 4 mg/h via INTRAVENOUS
  Filled 2020-09-11 (×4): qty 200

## 2020-09-11 MED ORDER — LIDOCAINE-EPINEPHRINE 1 %-1:100000 IJ SOLN
INTRAMUSCULAR | Status: DC | PRN
  Administered 2020-09-11: 10 mL

## 2020-09-11 MED ORDER — PROPOFOL 10 MG/ML IV BOLUS
INTRAVENOUS | Status: AC
  Filled 2020-09-11: qty 40

## 2020-09-11 MED ORDER — ROCURONIUM BROMIDE 10 MG/ML (PF) SYRINGE
PREFILLED_SYRINGE | INTRAVENOUS | Status: AC
  Filled 2020-09-11: qty 10

## 2020-09-11 MED ORDER — SODIUM CHLORIDE 0.9 % IR SOLN
Status: DC | PRN
  Administered 2020-09-11 (×2): 1000 mL

## 2020-09-11 MED ORDER — POLYETHYLENE GLYCOL 3350 17 G PO PACK: 17.0000 g | PACK | Freq: Every day | ORAL | Status: DC | PRN

## 2020-09-11 MED ORDER — POTASSIUM CHLORIDE IN NACL 20-0.9 MEQ/L-% IV SOLN
INTRAVENOUS | Status: DC
  Filled 2020-09-11 (×2): qty 1000

## 2020-09-11 MED ORDER — THROMBIN 5000 UNITS EX SOLR: OROMUCOSAL | Status: DC | PRN

## 2020-09-11 MED ORDER — THROMBIN 20000 UNITS EX SOLR
CUTANEOUS | Status: AC
  Filled 2020-09-11: qty 20000

## 2020-09-11 MED ORDER — CHLORHEXIDINE GLUCONATE CLOTH 2 % EX PADS
6.0000 | MEDICATED_PAD | Freq: Every day | CUTANEOUS | Status: DC
  Administered 2020-09-11 – 2020-09-17 (×6): 6 via TOPICAL

## 2020-09-11 MED ORDER — FUROSEMIDE 10 MG/ML IJ SOLN
INTRAMUSCULAR | Status: DC | PRN
Start: 2020-09-11 — End: 2020-09-11
  Administered 2020-09-11: 20 mg via INTRAMUSCULAR

## 2020-09-11 MED ORDER — MANNITOL 25 % IV SOLN
INTRAVENOUS | Status: DC | PRN
  Administered 2020-09-11: 31.95 g via INTRAVENOUS

## 2020-09-11 MED ORDER — ENALAPRILAT 1.25 MG/ML IV SOLN
1.2500 mg | Freq: Four times a day (QID) | INTRAVENOUS | Status: DC | PRN
  Administered 2020-09-14 – 2020-09-17 (×5): 1.25 mg via INTRAVENOUS
  Filled 2020-09-11 (×8): qty 1

## 2020-09-11 MED ORDER — DOCUSATE SODIUM 100 MG PO CAPS
100.0000 mg | ORAL_CAPSULE | Freq: Two times a day (BID) | ORAL | Status: DC
  Administered 2020-09-11 – 2020-09-12 (×2): 100 mg via ORAL
  Filled 2020-09-11 (×4): qty 1

## 2020-09-11 MED ORDER — CEFAZOLIN SODIUM-DEXTROSE 2-4 GM/100ML-% IV SOLN
2.0000 g | INTRAVENOUS | Status: AC
  Administered 2020-09-11 (×2): 2 g via INTRAVENOUS
  Filled 2020-09-11: qty 100

## 2020-09-11 MED ORDER — SUGAMMADEX SODIUM 200 MG/2ML IV SOLN
INTRAVENOUS | Status: DC | PRN
  Administered 2020-09-11: 300 mg via INTRAVENOUS

## 2020-09-11 MED ORDER — ONDANSETRON HCL 4 MG/2ML IJ SOLN: 4.0000 mg | INTRAMUSCULAR | Status: DC | PRN

## 2020-09-11 MED ORDER — PROPOFOL 10 MG/ML IV BOLUS
INTRAVENOUS | Status: DC | PRN
  Administered 2020-09-11 (×2): 50 mg via INTRAVENOUS
  Administered 2020-09-11: 200 mg via INTRAVENOUS
  Administered 2020-09-11: 70 mg via INTRAVENOUS
  Administered 2020-09-11: 60 mg via INTRAVENOUS

## 2020-09-11 MED ORDER — THROMBIN 5000 UNITS EX SOLR
CUTANEOUS | Status: AC
  Filled 2020-09-11: qty 10000

## 2020-09-11 MED ORDER — BACITRACIN ZINC 500 UNIT/GM EX OINT
TOPICAL_OINTMENT | CUTANEOUS | Status: DC | PRN
  Administered 2020-09-11 (×2): 1 via TOPICAL

## 2020-09-11 MED ORDER — PHENYLEPHRINE 40 MCG/ML (10ML) SYRINGE FOR IV PUSH (FOR BLOOD PRESSURE SUPPORT)
PREFILLED_SYRINGE | INTRAVENOUS | Status: DC | PRN
  Administered 2020-09-11: 80 ug via INTRAVENOUS

## 2020-09-11 MED ORDER — PROMETHAZINE HCL 25 MG PO TABS
12.5000 mg | ORAL_TABLET | ORAL | Status: DC | PRN
  Filled 2020-09-11: qty 1

## 2020-09-11 MED ORDER — DEXAMETHASONE SODIUM PHOSPHATE 10 MG/ML IJ SOLN
INTRAMUSCULAR | Status: AC
  Filled 2020-09-11: qty 1

## 2020-09-11 MED ORDER — PANTOPRAZOLE SODIUM 40 MG PO TBEC
40.0000 mg | DELAYED_RELEASE_TABLET | Freq: Every day | ORAL | Status: DC
  Administered 2020-09-11 – 2020-09-17 (×7): 40 mg via ORAL
  Filled 2020-09-11 (×7): qty 1

## 2020-09-11 MED ORDER — MORPHINE SULFATE (PF) 2 MG/ML IV SOLN: 1.0000 mg | INTRAVENOUS | Status: DC | PRN

## 2020-09-11 MED ORDER — LIDOCAINE-EPINEPHRINE 1 %-1:100000 IJ SOLN
INTRAMUSCULAR | Status: AC
  Filled 2020-09-11: qty 1

## 2020-09-11 MED ORDER — LABETALOL HCL 5 MG/ML IV SOLN
10.0000 mg | INTRAVENOUS | Status: DC | PRN
  Administered 2020-09-12 – 2020-09-16 (×12): 10 mg via INTRAVENOUS
  Filled 2020-09-11 (×12): qty 4

## 2020-09-11 MED ORDER — ONDANSETRON HCL 4 MG/2ML IJ SOLN
INTRAMUSCULAR | Status: AC
  Filled 2020-09-11: qty 2

## 2020-09-11 MED ORDER — ENOXAPARIN SODIUM 40 MG/0.4ML ~~LOC~~ SOLN
40.0000 mg | SUBCUTANEOUS | Status: DC
  Administered 2020-09-12 – 2020-09-17 (×6): 40 mg via SUBCUTANEOUS
  Filled 2020-09-11 (×6): qty 0.4

## 2020-09-11 SURGICAL SUPPLY — 112 items
APL SKNCLS STERI-STRIP NONHPOA (GAUZE/BANDAGES/DRESSINGS)
BAND INSRT 18 STRL LF DISP RB (MISCELLANEOUS) ×4
BAND RUBBER #18 3X1/16 STRL (MISCELLANEOUS) ×4 IMPLANT
BENZOIN TINCTURE PRP APPL 2/3 (GAUZE/BANDAGES/DRESSINGS) IMPLANT
BLADE CLIPPER SURG (BLADE) ×4 IMPLANT
BLADE SAW GIGLI 16 STRL (MISCELLANEOUS) IMPLANT
BLADE SURG 11 STRL SS (BLADE) ×4 IMPLANT
BLADE SURG 15 STRL LF DISP TIS (BLADE) IMPLANT
BLADE SURG 15 STRL SS (BLADE) ×4
BLADE ULTRA TIP 2M (BLADE) ×2 IMPLANT
BNDG CMPR 75X41 PLY HI ABS (GAUZE/BANDAGES/DRESSINGS)
BNDG GAUZE ELAST 4 BULKY (GAUZE/BANDAGES/DRESSINGS) IMPLANT
BNDG STRETCH 4X75 STRL LF (GAUZE/BANDAGES/DRESSINGS) IMPLANT
BUR ACORN 9.0 PRECISION (BURR) ×3 IMPLANT
BUR ACORN 9.0MM PRECISION (BURR) ×1
BUR RND DIAMOND ELITE 4.0 (BURR) ×1 IMPLANT
BUR RND DIAMOND ELITE 4.0MM (BURR) ×1
BUR ROUND FLUTED 4 SOFT TCH (BURR) IMPLANT
BUR ROUND FLUTED 4MM SOFT TCH (BURR)
BUR SABER DIAMOND 2.5 (BURR) ×2 IMPLANT
BUR SPIRAL ROUTER 2.3 (BUR) ×3 IMPLANT
BUR SPIRAL ROUTER 2.3MM (BUR) ×1
CANISTER SUCT 3000ML PPV (MISCELLANEOUS) ×10 IMPLANT
CATH VENTRIC 35X38 W/TROCAR LG (CATHETERS) IMPLANT
CLIP VESOCCLUDE MED 6/CT (CLIP) IMPLANT
CNTNR URN SCR LID CUP LEK RST (MISCELLANEOUS) ×2 IMPLANT
CONT SPEC 4OZ STRL OR WHT (MISCELLANEOUS) ×8
COVER BURR HOLE 20 W/TAB UNI (Plate) ×2 IMPLANT
COVER BURR HOLE UNIV 10 (Orthopedic Implant) ×6 IMPLANT
COVER MAYO STAND STRL (DRAPES) IMPLANT
COVER WAND RF STERILE (DRAPES) ×2 IMPLANT
DECANTER SPIKE VIAL GLASS SM (MISCELLANEOUS) ×4 IMPLANT
DRAIN SUBARACHNOID (WOUND CARE) IMPLANT
DRAPE HALF SHEET 40X57 (DRAPES) ×4 IMPLANT
DRAPE MICROSCOPE LEICA (MISCELLANEOUS) ×2 IMPLANT
DRAPE NEUROLOGICAL W/INCISE (DRAPES) ×4 IMPLANT
DRAPE STERI IOBAN 125X83 (DRAPES) IMPLANT
DRAPE SURG 17X23 STRL (DRAPES) IMPLANT
DRAPE WARM FLUID 44X44 (DRAPES) ×4 IMPLANT
DRSG ADAPTIC 3X8 NADH LF (GAUZE/BANDAGES/DRESSINGS) IMPLANT
DRSG AQUACEL AG ADV 3.5X10 (GAUZE/BANDAGES/DRESSINGS) ×2 IMPLANT
DRSG OPSITE 4X5.5 SM (GAUZE/BANDAGES/DRESSINGS) ×2 IMPLANT
DRSG TELFA 3X8 NADH (GAUZE/BANDAGES/DRESSINGS) IMPLANT
DURAPREP 6ML APPLICATOR 50/CS (WOUND CARE) ×4 IMPLANT
ELECT COATED BLADE 2.86 ST (ELECTRODE) ×4 IMPLANT
ELECT REM PT RETURN 9FT ADLT (ELECTROSURGICAL) ×4
ELECTRODE REM PT RTRN 9FT ADLT (ELECTROSURGICAL) ×2 IMPLANT
EVACUATOR 1/8 PVC DRAIN (DRAIN) ×2 IMPLANT
EVACUATOR SILICONE 100CC (DRAIN) IMPLANT
FORCEPS BIPO MALIS IRRIG 9X1.5 (NEUROSURGERY SUPPLIES) ×4 IMPLANT
GAUZE 4X4 16PLY RFD (DISPOSABLE) IMPLANT
GAUZE SPONGE 4X4 12PLY STRL (GAUZE/BANDAGES/DRESSINGS) ×2 IMPLANT
GLOVE BIO SURGEON STRL SZ7.5 (GLOVE) ×2 IMPLANT
GLOVE BIOGEL PI IND STRL 7.5 (GLOVE) ×2 IMPLANT
GLOVE BIOGEL PI IND STRL 8 (GLOVE) IMPLANT
GLOVE BIOGEL PI INDICATOR 7.5 (GLOVE) ×10
GLOVE BIOGEL PI INDICATOR 8 (GLOVE) ×4
GLOVE ECLIPSE 7.5 STRL STRAW (GLOVE) ×8 IMPLANT
GLOVE EXAM NITRILE LRG STRL (GLOVE) IMPLANT
GLOVE EXAM NITRILE XL STR (GLOVE) IMPLANT
GLOVE SURG SS PI 7.0 STRL IVOR (GLOVE) ×8 IMPLANT
GOWN STRL REUS W/ TWL LRG LVL3 (GOWN DISPOSABLE) ×4 IMPLANT
GOWN STRL REUS W/ TWL XL LVL3 (GOWN DISPOSABLE) IMPLANT
GOWN STRL REUS W/TWL 2XL LVL3 (GOWN DISPOSABLE) ×2 IMPLANT
GOWN STRL REUS W/TWL LRG LVL3 (GOWN DISPOSABLE) ×8
GOWN STRL REUS W/TWL XL LVL3 (GOWN DISPOSABLE) ×4
HEMOSTAT POWDER KIT SURGIFOAM (HEMOSTASIS) ×6 IMPLANT
HEMOSTAT SURGICEL 2X14 (HEMOSTASIS) ×4 IMPLANT
HOOK DURA 1/2IN (MISCELLANEOUS) ×2 IMPLANT
HOOK RETRACTION 12 ELAST STAY (MISCELLANEOUS) ×10 IMPLANT
IV NS 1000ML (IV SOLUTION) ×8
IV NS 1000ML BAXH (IV SOLUTION) ×2 IMPLANT
KIT BASIN OR (CUSTOM PROCEDURE TRAY) ×4 IMPLANT
KIT DRAIN CSF ACCUDRAIN (MISCELLANEOUS) IMPLANT
KIT TURNOVER KIT B (KITS) ×4 IMPLANT
MARKER SPHERE PSV REFLC 13MM (MARKER) ×10 IMPLANT
NDL SPNL 18GX3.5 QUINCKE PK (NEEDLE) IMPLANT
NEEDLE HYPO 22GX1.5 SAFETY (NEEDLE) ×4 IMPLANT
NEEDLE SPNL 18GX3.5 QUINCKE PK (NEEDLE) IMPLANT
NS IRRIG 1000ML POUR BTL (IV SOLUTION) ×12 IMPLANT
PACK CRANIOTOMY CUSTOM (CUSTOM PROCEDURE TRAY) ×4 IMPLANT
PAD DRESSING TELFA 3X8 NADH (GAUZE/BANDAGES/DRESSINGS) IMPLANT
PATTIES SURGICAL .25X.25 (GAUZE/BANDAGES/DRESSINGS) IMPLANT
PATTIES SURGICAL .5 X.5 (GAUZE/BANDAGES/DRESSINGS) ×2 IMPLANT
PATTIES SURGICAL .5 X3 (DISPOSABLE) ×4 IMPLANT
PATTIES SURGICAL 1/4 X 3 (GAUZE/BANDAGES/DRESSINGS) ×2 IMPLANT
PATTIES SURGICAL 1X1 (DISPOSABLE) IMPLANT
PIN MAYFIELD SKULL DISP (PIN) ×4 IMPLANT
SCREW UNIII AXS SD 1.5X4 (Screw) ×36 IMPLANT
SEALANT ADHERUS EXTEND TIP (MISCELLANEOUS) ×2 IMPLANT
SET CARTRIDGE AND TUBING (SET/KITS/TRAYS/PACK) ×2 IMPLANT
SET TUBING IRRIGATION DISP (TUBING) ×4 IMPLANT
SPONGE NEURO XRAY DETECT 1X3 (DISPOSABLE) IMPLANT
SPONGE SURGIFOAM ABS GEL 100 (HEMOSTASIS) ×4 IMPLANT
STAPLER VISISTAT 35W (STAPLE) ×6 IMPLANT
STOCKINETTE 6  STRL (DRAPES) ×4
STOCKINETTE 6 STRL (DRAPES) IMPLANT
SUT ETHILON 3 0 FSL (SUTURE) IMPLANT
SUT ETHILON 3 0 PS 1 (SUTURE) IMPLANT
SUT MNCRL AB 3-0 PS2 18 (SUTURE) IMPLANT
SUT NURALON 4 0 TR CR/8 (SUTURE) ×10 IMPLANT
SUT SILK 0 TIES 10X30 (SUTURE) IMPLANT
SUT VIC AB 2-0 CP2 18 (SUTURE) ×8 IMPLANT
TIP SHEAR CVD EXTENDED 36KH (INSTRUMENTS) ×2 IMPLANT
TOWEL GREEN STERILE (TOWEL DISPOSABLE) ×4 IMPLANT
TOWEL GREEN STERILE FF (TOWEL DISPOSABLE) ×4 IMPLANT
TRAY FOLEY MTR SLVR 16FR STAT (SET/KITS/TRAYS/PACK) ×4 IMPLANT
TUBE CONNECTING 12'X1/4 (SUCTIONS)
TUBE CONNECTING 12X1/4 (SUCTIONS) ×2 IMPLANT
UNDERPAD 30X36 HEAVY ABSORB (UNDERPADS AND DIAPERS) ×2 IMPLANT
WATER STERILE IRR 1000ML POUR (IV SOLUTION) ×4 IMPLANT
WRENCH TORQUE 36KHZ (INSTRUMENTS) ×2 IMPLANT

## 2020-09-11 NOTE — Anesthesia Procedure Notes (Signed)
Arterial Line Insertion Start/End12/23/2021 7:00 AM, 09/11/2020 7:05 AM Performed by: Rande Brunt, CRNA, CRNA  Patient location: Pre-op. Preanesthetic checklist: patient identified, IV checked, site marked, risks and benefits discussed, surgical consent, monitors and equipment checked, pre-op evaluation, timeout performed and anesthesia consent Lidocaine 1% used for infiltration Left, radial was placed Catheter size: 20 G Hand hygiene performed  and maximum sterile barriers used  Allen's test indicative of satisfactory collateral circulation Attempts: 1 Procedure performed without using ultrasound guided technique. Following insertion, dressing applied and Biopatch. Post procedure assessment: normal and unchanged

## 2020-09-11 NOTE — Anesthesia Postprocedure Evaluation (Signed)
Anesthesia Post Note  Patient: [name] Page  Procedure(s) Performed: Right Pterional Craniotomy for resection of anterior skull base mass with Brain Lab (Right Head) APPLICATION OF CRANIAL NAVIGATION (Right )     Patient location during evaluation: PACU Anesthesia Type: General Level of consciousness: sedated Pain management: pain level controlled Vital Signs Assessment: post-procedure vital signs reviewed and stable Respiratory status: spontaneous breathing and respiratory function stable Cardiovascular status: stable Postop Assessment: no apparent nausea or vomiting Anesthetic complications: no   No complications documented.  Last Vitals:  Vitals:   09/11/20 1715 09/11/20 1730  BP: (!) 145/61   Pulse: 83 80  Resp: 18 19  Temp:    SpO2: 93% 94%    Last Pain:  Vitals:   09/11/20 1700  TempSrc: Oral  PainSc: [place]

## 2020-09-11 NOTE — Anesthesia Procedure Notes (Signed)
Central Venous Catheter Insertion Performed by: Duane Boston, MD, anesthesiologist Start/End12/23/2021 7:06 AM, 09/11/2020 7:16 AM Patient location: Pre-op. Preanesthetic checklist: patient identified, IV checked, site marked, risks and benefits discussed, surgical consent, monitors and equipment checked, pre-op evaluation, timeout performed and anesthesia consent Position: Trendelenburg Lidocaine 1% used for infiltration and patient sedated Hand hygiene performed , maximum sterile barriers used  and Seldinger technique used Catheter size: 8 Fr Total catheter length 16. Central line was placed.Double lumen Procedure performed using ultrasound guided technique. Ultrasound Notes:anatomy identified, needle tip was noted to be adjacent to the nerve/plexus identified, no ultrasound evidence of intravascular and/or intraneural injection and image(s) printed for medical record Attempts: 1 Following insertion, dressing applied, line sutured and Biopatch. Post procedure assessment: blood return through all ports, free fluid flow and no air  Patient tolerated the procedure well with no immediate complications.

## 2020-09-11 NOTE — Progress Notes (Signed)
Neurosurgery  Patient seen and examined. Neurologic exam stable.  Right anterior skull base tumor - resection today

## 2020-09-11 NOTE — Op Note (Signed)
Procedure(s): Right Pterional Craniotomy for resection of anterior skull base mass with Brain Lab APPLICATION OF CRANIAL NAVIGATION Procedure Note  [name] Page female 69 y.o. 09/11/2020  Procedure(s) and Anesthesia Type:    * Right Pterional Craniotomy for resection of anterior skull base mass with Brain Lab - General    * APPLICATION OF CRANIAL NAVIGATION - General  Surgeon(s) and Role:    Marcello Moores, Dorcas Carrow, MD - Primary    Kristeen Miss, MD - Assisting   Indications: This is a 69 year old woman who presented hospital with progressive neurologic decline and cognitive decline with failure to thrive.  She had several necrotic skin lesions on her back, chest wall, and scalp that she had ignored.  She was found to have a large anterior skull base mass involving the planum sphenoidale, medial sphenoid wing and clinoid with severe mass-effect on the right frontal lobe with a large amount of vasogenic edema and greater than 1 cm of subfalcine herniation.  Her neurologic exam did improve with steroids.  She had iron deficiency anemia with a preoperative hemoglobin of 8.  For purposes of relief of mass-effect, surgical resection was recommended to the patient and her daughter.  Risks, benefits, alternatives, and expected convalescence were discussed with them.  Risks discussed included, but were not limited to, bleeding, pain, infection, seizure, stroke, scar, neurologic deficit, recurrence, spinal fluid leak, coma, and death.  Informed consent was obtained.   Surgeon: Vallarie Mare   Assistants: Kristeen Miss, MD.  Please note there were no qualified trainees available to assist with the procedure.  Assistance was required with delicate retraction of the brain.  Anesthesia: General endotracheal anesthesia  Procedure Detail  1.  Transcranial approach to anterior skull base with pterional craniotomy, lateral orbitectomy, intradural clinoidectomy 2.  Resection of neoplastic lesion of  anterior skull base 3.  Use of computer-assisted neuronavigation 4.  Use of microscope for intraoperative microdissection   Patient was brought to the operating room.  General anesthesia was induced and patient was intubated by the anesthesia service.  After appropriate lines and monitors were placed, patient positioned supine with head turned to the left.  Her head was placed in a [place] head holder and affixed to the bed.  A preoperative thin cut CT was reconstructed into a 3D image and was fused to a preoperative MRI.  This was used for surface match registration to allow for computer-assisted neuro navigation throughout the case.  The scalp hair was clipped, and the scalp was preprepped with alcohol and then prepped and draped in sterile fashion.  A curvilinear incision was planned starting in front of the tragus to the anterior hairline at the mid pupillary location.  A timeout was performed.  Preoperative antibiotics, and dexamethasone were given.  1% lidocaine with epinephrine was injected the skin.  Incision was made with a 10 blade and the galea was opened sharply.  An interfascial dissection was performed of the temporalis muscle to protect the frontalis branch of the facial nerve.  The scalp was reflected forward and held in place with fishhooks.  The temporalis muscle was then detached from the lateral orbit and skull and a superior cuff was left for later reattachment.  The muscle was reflected inferiorly and posteriorly.  The patient had a prominent frontal sinus, so neuro navigation was used to outline the sinus on the skull to avoid opening of the frontal sinus.  A temporal squamosal bur hole, keyhole bur hole and 2 additional bur holes  were made with a high-speed drill which were used to dissect the dura from the inner table skull.  Craniotome was used to perform a craniotomy cutting across the sphenoid.  Meticulous epidural hemostasis obtained.  The lateral sphenoid wing was then drilled  with high-speed drill down to the superior orbital fissure and the lateral orbit and orbital roof were removed to allow straighter trajectory to the tumor.  The meningoorbital artery was coagulated and cut.  The frontal lobe was quite swollen even with the mannitol and dexamethasone, so I elected to perform the clinoidectomy intradurally after the brain was more relaxed.  The dura was opened in C-shaped manner and flapped forward.  The brain was clearly quite swollen but after the bony work for the approach, the path of the tumor was very direct.  The remainder of the surgery was performed with microscope and microdissection technique under high magnification.  To help with frontal lobe retraction, the sylvian fissure was opened from proximal to distal.  The ICA, A1, M1 to the bifurcation were dissected, and the opticocarotid cistern was opened.  Following these maneuvers, the frontal lobe was significantly more relaxed.  The frontal lobe was then sharply dissected from the ipsilateral olfactory nerve and optic nerve.  Tumor capsule was dissected from the frontal lobe.  However, it was clear there was essentially no arachnoid plane present between the brain and tumor, which was expected given the severe amount of vasogenic edema that was associated with the tumor.  Extracapsular dissection was performed.  The base of the tumor was then cut across with bipolar electrocautery and microscissors to devascularize the tumor.  The tumor was then debulked with ultrasonic aspirator.  This allowed for infolding of the tumor capsule.  Several rounds of debulking and extracapsular dissection were performed until the contralateral brain tumor surface interface was visualized.  Frozen section was sent and returned as epithelioid tumor. The tumor was sharply dissected from the olfactory nerve, but the right sided olfactory nerve appeared extremely thinned out and the portion as it was entering the olfactory groove.  The bulk of  the tumor was then able to be removed essentially in 1 piece.  Remaining stump of tumor on the planum was then dissected from the plane of bone with a penfield and ethmoidal artery feeders were coagulated.  A portion of the tumor medial to the clinoid was extending into the interior dural space as well as into the optic canal.  The dura over the clinoid, optic strut, and orbital roof of was opened sharply.  Small diamond bit drill with constant irrigation was used to remove the roof of the optic canal, drilled the optic strut and remove part of the clinoid dura to gain access to this portion of the tumor.  The falciform ligament was cut with a sickle knife to allow greater laxity of the optic nerve.  The dural attachment of the tumor was then cut sharply.  Using proton microdissectors, the tumor was successfully dissected from the lateral aspect of the optic nerve as well as the internal carotid artery.  Remaining dural attachment was coagulated.  Meticulous hemostasis was obtained.  The cavity was inspected and no residual tumor was seen.  The dura was then closed with 4-0 Nurolon in running fashion.  The stitch line was reinforced with Adherus dural spray.  The bone flap was replaced with Stryker cranial plating system.  The sphenoid osteotomy defect was reconstructed with a larger cranial plate.  A Hemovac drain was placed  over the bone flap and tunneled out the skin and secured with a stitch.  The muscles reapproximated with 2-0 Vicryl stitches.  The wound was irrigated thoroughly.  Skin was closed to Vicryl stitches in buried fashion followed by staples.  Sterile dressing was placed.  Patient was then moved from [place] head holder and extubated by anesthesia service.  All counts were correct at the end of surgery.  No complications were noted.  This was a particularly difficult skull base tumor removal given the tight adherence of the tumor to the neurovascular structures.   Findings: Large anterior skull  base tumor with considerable amount of brain swelling  Estimated Blood Loss:  300 ml         Drains: HEMOVAC         Blood Given: 2 units PRBCs          Specimens: anterior skull base mass         Implants: Stryker cranial plating system        Complications:  * No complications entered in OR log *         Disposition: PACU - hemodynamically stable.         Condition: stable

## 2020-09-11 NOTE — Anesthesia Procedure Notes (Signed)
Procedure Name: Intubation Date/Time: 09/11/2020 7:59 AM Performed by: Rande Brunt, CRNA Pre-anesthesia Checklist: Patient identified, Emergency Drugs available, Suction available and Patient being monitored Patient Re-evaluated:Patient Re-evaluated prior to induction Oxygen Delivery Method: Circle System Utilized Preoxygenation: Pre-oxygenation with 100% oxygen Induction Type: IV induction Ventilation: Mask ventilation without difficulty Laryngoscope Size: Mac and 3 Grade View: Grade I Tube type: Oral Tube size: 7.0 mm Number of attempts: 1 Airway Equipment and Method: Stylet and Oral airway Placement Confirmation: ETT inserted through vocal cords under direct vision,  positive ETCO2 and breath sounds checked- equal and bilateral Secured at: 21 cm Tube secured with: Tape Dental Injury: Teeth and Oropharynx as per pre-operative assessment

## 2020-09-11 NOTE — Progress Notes (Signed)
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  She was recently started on iron supplements for anemia.  Apparently she was scheduled for EGD and colonoscopy on 09/05/20.  Her daughter reports she has been confused, forgetful and been complaining about headache recently, Patient reports intermittent headache for almost a year which has recently gotten worse.  Head CT showed anterior cranial fossa meningioma causing severe vasogenic edema within the right frontal lobe, leftward midline shift with subfalcine herniation of the right cingulate gyrus with early entrapment of the left lateral ventricle.  Neurosurgery was consulted by ED physician who recommended to initiate Decadron 10 mg daily and patient underwent meningioma removal on 12/23 without major complications.  Placed in ICU after.  She is also found to have a 3 mm stone without significant hydronephrosis,  Dr. Caprice Beaver urologist was consulted,  recommended continue antibiotics.  Assessment/Plan: Principal Problem:   Meningioma [facility]): Looks to be causing her intermittent headaches for the past year.  MRI and CT noted large meningioma causing mass-effect.  Started on IV dexamethasone and Keppra.  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     Cancer of skin, squamous cell: Squamous cell versus basal cell carcinoma of skin of chest, right breast and lower back.  Initially had been seeing a dermatologist, but since then had stopped  following up.  Daughter was unaware lesions had grown in size and plan will be for patient to follow-up with dermatology as outpatient.  Dermatology does not come into the hospital.   Stage IIIa chronic kidney disease: Looks to be at baseline    UTI (urinary tract infection): Urinalysis with moderate leukocytosis and CT of abdomen bilateral renal stones, but none obstruction without significant hydroureter.  Completed 3-day course of IV Rocephin.  Insignificant growth in culture.  Discussed with urology  Iron deficiency anemia: Recently found to be anemic and started on iron supplement.  Was supposed to have EGD and colonoscopy done on 12/17.  Hemoglobin down to 8, but since then has come up on its own.  Plan is to reschedule outpatient scope although may need inpatient consultation if hemoglobin has dropped.  Code Status: Partial code  Family Communication: Left message for daughter  Disposition Plan: Anticipate discharge to home   Consultants:  Neurosurgery  Urology  Procedures:  Planned meningioma resection 12/23  Antimicrobials:  IV Rocephin 12/16-12/19  DVT prophylaxis: SCDs   Objective: Vitals:   09/10/20 2342 09/11/20 0338  BP: 135/61 128/63  Pulse: 69 60  Resp: 16 16  Temp: 98 F (36.7 C) 98.1 F (36.7 C)  SpO2: 98% 99%    Intake/Output Summary (Last 24 hours) at 09/11/2020 1528 Last data filed at 09/11/2020 1443 Gross per 24 hour  Intake 3432.8 ml  Output 2925 ml  Net 507.8 ml   Filed Weights   09/07/20 0433  Weight: 63.9 kg   Body mass index is 25.77 kg/m.  Exam:   Patient not seen as she was in surgery for most of the day   Data Reviewed: CBC:  Recent Labs  Lab 09/05/20 0211 09/06/20 0312 09/07/20 0108 09/08/20 0240 09/09/20 0215 09/10/20 0246  WBC 6.6 9.0 7.9 6.5 6.9  --   NEUTROABS  --  8.1* 6.9 5.5 5.8  --   HGB 9.0* 7.9* 7.7* 8.0* 8.5* 8.5*  HCT 31.6* 27.7* 27.0* 27.6* 28.1* 28.7*  MCV 82.7 82.0 82.1 81.9 81.2  --   PLT 225  183 186 176 197  --    Basic Metabolic Panel: Recent Labs  Lab 09/06/20 0312 09/07/20 0108 09/08/20 0240 09/09/20 0215 09/10/20 0246  NA 140 137 138 139 136  K 3.9 3.9 4.3 4.3 4.4  CL 108 107 106 107 105  CO2 21* 23 23 23 23   GLUCOSE 146* 148* 140* 136* 133*  BUN 23 31* 32* 30* 30*  CREATININE 1.28* 1.29* 1.15* 1.08* 1.14*  CALCIUM 8.0* 8.2* 8.4* 8.6* 8.3*  MG  --   --   --  1.9  --   PHOS  --   --   --  3.8  --    GFR: Estimated Creatinine Clearance: 40.3 mL/min (A) (by C-G formula based on SCr of 1.14 mg/dL (H)). Liver Function Tests: Recent Labs  Lab 09/04/20 1545  AST 21  ALT 14  ALKPHOS 121  BILITOT 0.7  PROT 7.7  ALBUMIN 3.5   No results for input(s): LIPASE, AMYLASE in the last 168 hours. No results for input(s): AMMONIA in the last 168 hours. Coagulation Profile: Recent Labs  Lab 09/04/20 1545  INR 1.0   Cardiac Enzymes: No results for input(s): CKTOTAL, CKMB, CKMBINDEX, TROPONINI in the last 168 hours. BNP (last 3 results) No results for input(s): PROBNP in the last 8760 hours. HbA1C: No results for input(s): HGBA1C in the last 72 hours. CBG: No results for input(s): GLUCAP in the last 168 hours. Lipid Profile: No results for input(s): CHOL, HDL, LDLCALC, TRIG, CHOLHDL, LDLDIRECT in the last 72 hours. Thyroid Function Tests: No results for input(s): TSH, T4TOTAL, FREET4, T3FREE, THYROIDAB in the last 72 hours. Anemia Panel: No results for input(s): VITAMINB12, FOLATE, FERRITIN, TIBC, IRON, RETICCTPCT in the last 72 hours. Urine analysis:    Component Value Date/Time   COLORURINE YELLOW 09/04/2020 1659   APPEARANCEUR HAZY (A) 09/04/2020 1659   LABSPEC 1.016 09/04/2020 1659   PHURINE 6.0 09/04/2020 1659   GLUCOSEU NEGATIVE 09/04/2020 1659   HGBUR LARGE (A) 09/04/2020 1659   BILIRUBINUR NEGATIVE 09/04/2020 1659   KETONESUR 20 (A) 09/04/2020 1659   PROTEINUR 100 (A) 09/04/2020 1659   UROBILINOGEN 0.2 07/04/2014 0406   NITRITE NEGATIVE  09/04/2020 1659   LEUKOCYTESUR MODERATE (A) 09/04/2020 1659   Sepsis Labs: @LABRCNTIP (procalcitonin:4,lacticidven:4)  ) Recent Results (from the past 240 hour(s))  Urine culture     Status: Abnormal   Collection Time: 09/04/20  5:24 PM   Specimen: Urine, Clean Catch  Result Value Ref Range Status   Specimen Description   Final    URINE, CLEAN CATCH Performed at [facility], [address] [place], [place] [address]    Special Requests   Final    NONE Performed at [facility], [address], [place], [place] [address]    Culture (A)  Final    <10,000 COLONIES/mL INSIGNIFICANT GROWTH Performed at [facility], [place] [address], [place], [place] [address]    Report Status 09/06/2020 FINAL  Final  Resp Panel by RT-PCR (Flu A&B, Covid) Nasopharyngeal Swab     Status: None   Collection Time: 09/04/20  6:47 PM  Specimen: Nasopharyngeal Swab; Nasopharyngeal(NP) swabs in vial transport medium  Result Value Ref Range Status   SARS Coronavirus 2 by RT PCR NEGATIVE NEGATIVE Final    Comment: (NOTE) SARS-CoV-2 target nucleic acids are NOT DETECTED.  The SARS-CoV-2 RNA is generally detectable in upper respiratory specimens during the acute phase of infection. The lowest concentration of SARS-CoV-2 viral copies this assay can detect is 138 copies/mL. A negative result does not preclude SARS-Cov-2 infection and should not be used as the sole basis for treatment or other patient management decisions. A negative result may occur with  improper specimen collection/handling, submission of specimen other than nasopharyngeal swab, presence of viral mutation(s) within the areas targeted by this assay, and inadequate number of viral copies(<138 copies/mL). A negative result must be combined with clinical observations, patient history, and epidemiological information. The expected result is Negative.  Fact Sheet for Patients:  EntrepreneurPulse.com.au  Fact Sheet for  Healthcare Providers:  IncredibleEmployment.be  This test is no t yet approved or cleared by the [place] FDA and  has been authorized for detection and/or diagnosis of SARS-CoV-2 by FDA under an Emergency Use Authorization (EUA). This EUA will remain  in effect (meaning this test can be used) for the duration of the COVID-19 declaration under Section 564(b)(1) of the Act, 21 U.S.C.section 360bbb-3(b)(1), unless the authorization is terminated  or revoked sooner.       Influenza A by PCR NEGATIVE NEGATIVE Final   Influenza B by PCR NEGATIVE NEGATIVE Final    Comment: (NOTE) The Xpert Xpress SARS-CoV-2/FLU/RSV plus assay is intended as an aid in the diagnosis of influenza from Nasopharyngeal swab specimens and should not be used as a sole basis for treatment. Nasal washings and aspirates are unacceptable for Xpert Xpress SARS-CoV-2/FLU/RSV testing.  Fact Sheet for Patients: EntrepreneurPulse.com.au  Fact Sheet for Healthcare Providers: IncredibleEmployment.be  This test is not yet approved or cleared by the [place] FDA and has been authorized for detection and/or diagnosis of SARS-CoV-2 by FDA under an Emergency Use Authorization (EUA). This EUA will remain in effect (meaning this test can be used) for the duration of the COVID-19 declaration under Section 564(b)(1) of the Act, 21 U.S.C. section 360bbb-3(b)(1), unless the authorization is terminated or revoked.  Performed at [facility], [address], [place], [place] [address]   MRSA PCR Screening     Status: None   Collection Time: 09/10/20  5:18 PM   Specimen: Nasal Mucosa; Nasopharyngeal  Result Value Ref Range Status   MRSA by PCR NEGATIVE NEGATIVE Final    Comment:        The GeneXpert MRSA Assay (FDA approved for NASAL specimens only), is one component of a comprehensive MRSA colonization surveillance program. It is not intended to diagnose  MRSA infection nor to guide or monitor treatment for MRSA infections. Performed at [facility], [place] [address], [place], [place] [address]       Studies: No results found.  Scheduled Meds: . [MAR Hold] dexamethasone (DECADRON) injection  4 mg Intravenous Q6H  . [MAR Hold] ferrous sulfate  325 mg Oral Q breakfast  . [MAR Hold] levETIRAcetam  500 mg Oral BID  . [MAR Hold] pantoprazole  40 mg Oral Daily    Continuous Infusions:   LOS: 7 days     Annita Brod, MD [facility]   09/11/2020, 3:28 PM

## 2020-09-11 NOTE — OR Nursing (Signed)
Pt is awake,alert and oriented.Pt and/or family verbalized understanding of poc and discharge instructions. Reviewed admission and on going care with receiving RN. Pt is in NAD at this time and is ready to be transferred to floor. Will con't to monitor until pt is transferred.  Pt on 02 2l/min  Pt on Monitor NSR Pt has cardene infusing at this time receiving nurse made aware

## 2020-09-11 NOTE — OR Nursing (Signed)
Pt PRBC complete, pt has no s/s of transfusion issues noted. Resting comfortable on bed please see vs documentation

## 2020-09-11 NOTE — Transfer of Care (Signed)
Immediate Anesthesia Transfer of Care Note  Patient: [name] Page  Procedure(s) Performed: Right Pterional Craniotomy for resection of anterior skull base mass with Brain Lab (Right Head) APPLICATION OF CRANIAL NAVIGATION (Right )  Patient Location: PACU  Anesthesia Type:General  Level of Consciousness: awake, drowsy and patient cooperative  Airway & Oxygen Therapy: Patient Spontanous Breathing and Patient connected to face mask oxygen  Post-op Assessment: Report given to RN and Post -op Vital signs reviewed and stable  Post vital signs: Reviewed and stable  Last Vitals:  Vitals Value Taken Time  BP 196/72 09/11/20 1556  Temp    Pulse 64 09/11/20 1600  Resp 19 09/11/20 1600  SpO2 98 % 09/11/20 1600  Vitals shown include unvalidated device data.  Last Pain:  Vitals:   09/11/20 0338  TempSrc: Oral  PainSc:       Patients Stated Pain Goal: 0 (67/61/95 0932)  Complications: No complications documented.

## 2020-09-11 NOTE — Progress Notes (Signed)
Pt picked up and transported off unit to OR. Report called off to nurse [place] in [place]. Pt transported off unit via stretcher with daughter to the side. Delia Heady RN

## 2020-09-12 ENCOUNTER — Inpatient Hospital Stay (HOSPITAL_COMMUNITY): Payer: Medicare Other

## 2020-09-12 LAB — POCT I-STAT 7, (LYTES, BLD GAS, ICA,H+H)
Acid-base deficit: 1 mmol/L (ref 0.0–2.0)
Bicarbonate: 23.5 mmol/L (ref 20.0–28.0)
Calcium, Ion: 1.04 mmol/L — ABNORMAL LOW (ref 1.15–1.40)
HCT: 26 % — ABNORMAL LOW (ref 36.0–46.0)
Hemoglobin: 8.8 g/dL — ABNORMAL LOW (ref 12.0–15.0)
O2 Saturation: 100 %
Patient temperature: 37
Potassium: 4.4 mmol/L (ref 3.5–5.1)
Sodium: 139 mmol/L (ref 135–145)
TCO2: 25 mmol/L (ref 22–32)
pCO2 arterial: 37 mmHg (ref 32.0–48.0)
pH, Arterial: 7.411 (ref 7.350–7.450)
pO2, Arterial: 184 mmHg — ABNORMAL HIGH (ref 83.0–108.0)

## 2020-09-12 LAB — CBC WITH DIFFERENTIAL/PLATELET
Abs Immature Granulocytes: 0.12 10*3/uL — ABNORMAL HIGH (ref 0.00–0.07)
Basophils Absolute: 0 10*3/uL (ref 0.0–0.1)
Basophils Relative: 0 %
Eosinophils Absolute: 0 10*3/uL (ref 0.0–0.5)
Eosinophils Relative: 0 %
HCT: 31.4 % — ABNORMAL LOW (ref 36.0–46.0)
Hemoglobin: 9.6 g/dL — ABNORMAL LOW (ref 12.0–15.0)
Immature Granulocytes: 1 %
Lymphocytes Relative: 6 %
Lymphs Abs: 0.7 10*3/uL (ref 0.7–4.0)
MCH: 24.5 pg — ABNORMAL LOW (ref 26.0–34.0)
MCHC: 30.6 g/dL (ref 30.0–36.0)
MCV: 80.1 fL (ref 80.0–100.0)
Monocytes Absolute: 0.6 10*3/uL (ref 0.1–1.0)
Monocytes Relative: 6 %
Neutro Abs: 9.9 10*3/uL — ABNORMAL HIGH (ref 1.7–7.7)
Neutrophils Relative %: 87 %
Platelets: 153 10*3/uL (ref 150–400)
RBC: 3.92 MIL/uL (ref 3.87–5.11)
RDW: 22.4 % — ABNORMAL HIGH (ref 11.5–15.5)
WBC: 11.3 10*3/uL — ABNORMAL HIGH (ref 4.0–10.5)
nRBC: 0 % (ref 0.0–0.2)

## 2020-09-12 LAB — BASIC METABOLIC PANEL
Anion gap: 6 (ref 5–15)
BUN: 30 mg/dL — ABNORMAL HIGH (ref 8–23)
CO2: 21 mmol/L — ABNORMAL LOW (ref 22–32)
Calcium: 7 mg/dL — ABNORMAL LOW (ref 8.9–10.3)
Chloride: 112 mmol/L — ABNORMAL HIGH (ref 98–111)
Creatinine, Ser: 1.15 mg/dL — ABNORMAL HIGH (ref 0.44–1.00)
GFR, Estimated: 51 mL/min — ABNORMAL LOW (ref >60)
Glucose, Bld: 123 mg/dL — ABNORMAL HIGH (ref 70–99)
Potassium: 4.6 mmol/L (ref 3.5–5.1)
Sodium: 139 mmol/L (ref 135–145)

## 2020-09-12 IMAGING — MR MR HEAD WO/W CM
14 of 16 series · 42 of 48 positions shown · IV contrast (gadavist)
Comparison: Seven days ago

CLINICAL DATA: Meningioma resection.

EXAM:
MRI HEAD WITHOUT AND WITH CONTRAST
TECHNIQUE: Multiplanar, multiecho pulse sequences of the brain and surrounding
structures were obtained without and with intravenous contrast.
CONTRAST:  6.5mL GADAVIST GADOBUTROL 1 MMOL/ML IV SOLN

[Series 9: DWI · axial · 3.0mm · 0.88mm/px · z∈[-154,-10]mm · 8 of 100 slices shown (1 of 4)]
[im 1/100]
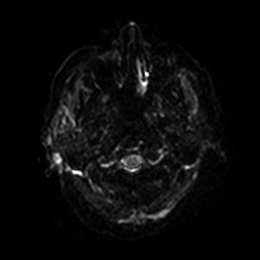
[im 15/100]
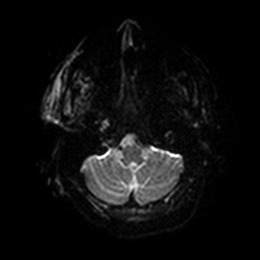
[im 29/100]
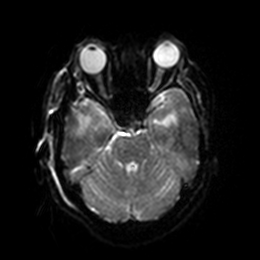
[im 43/100]
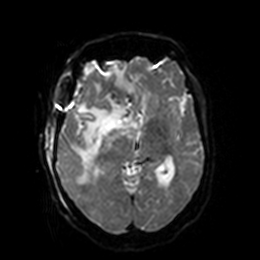
[im 57/100]
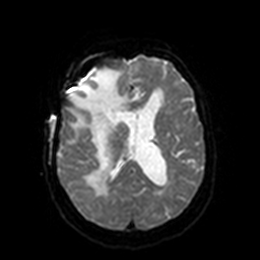
[im 71/100]
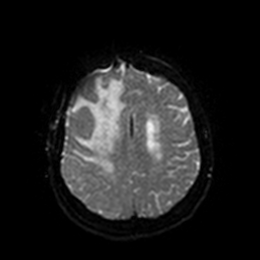
[im 85/100]
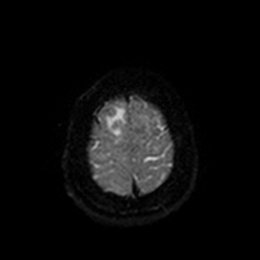
[im 100/100]
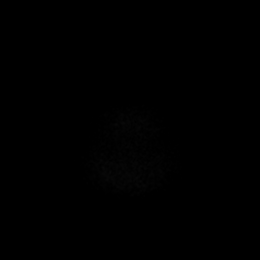

[Series 10: DWI · axial · 3.0mm · 0.88mm/px · z∈[-154,-10]mm · 3 of 50 slices shown (2 of 4)]
[im 1/50]
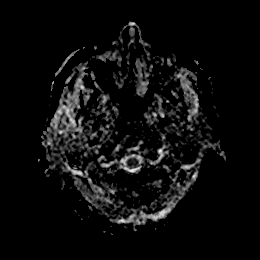
[im 25/50]
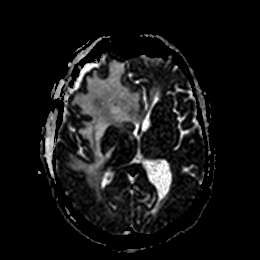
[im 50/50]
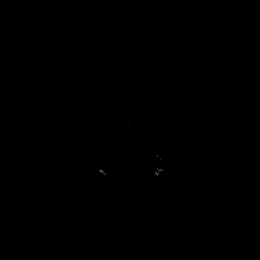

[Series 11: DWI · coronal · 4.0mm · 0.88mm/px · 5 of 68 slices shown (3 of 4)]
[im 1/68]
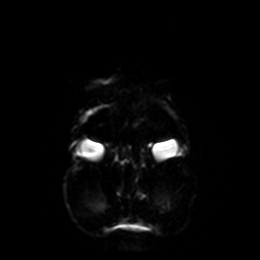
[im 17/68]
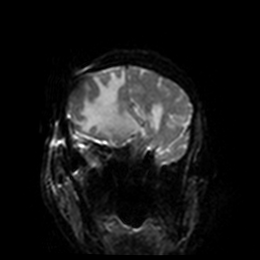
[im 34/68]
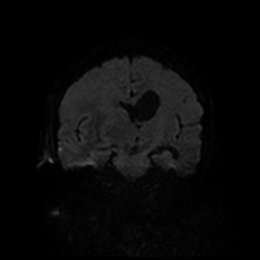
[im 51/68]
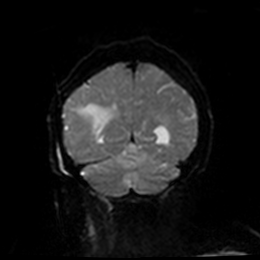
[im 68/68]
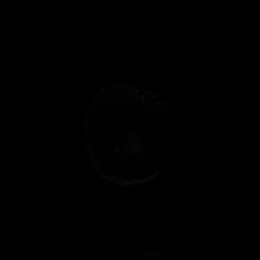

[Series 12: DWI · coronal · 4.0mm · 0.88mm/px · 2 of 34 slices shown (4 of 4)]
[im 1/34]
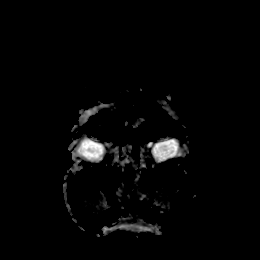
[im 34/34]
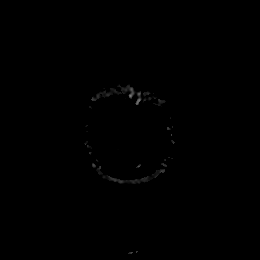

[Series 13: T1 · sagittal · 5.0mm · 0.75mm/px · 2 of 27 slices shown]
[im 1/27]
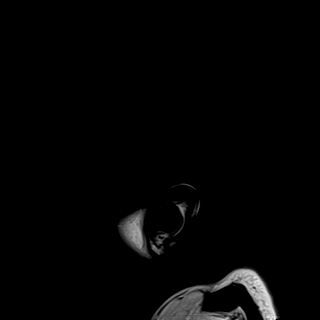
[im 27/27]
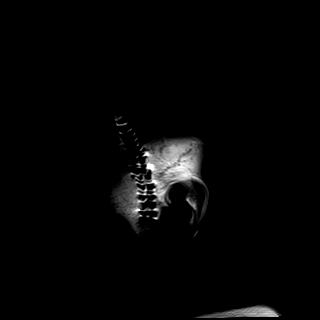

[Series 14: T2 · axial · 5.0mm · 0.72mm/px · z∈[-153,-12]mm · 2 of 25 slices shown]
[im 1/25]
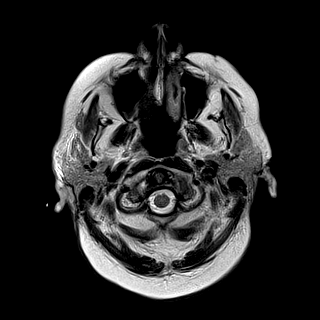
[im 25/25]
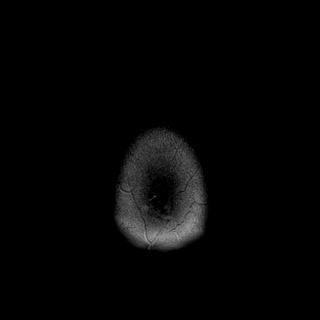

[Series 15: FLAIR · axial · 5.0mm · 0.45mm/px · z∈[-155,-14]mm · 2 of 25 slices shown]
[im 1/25]
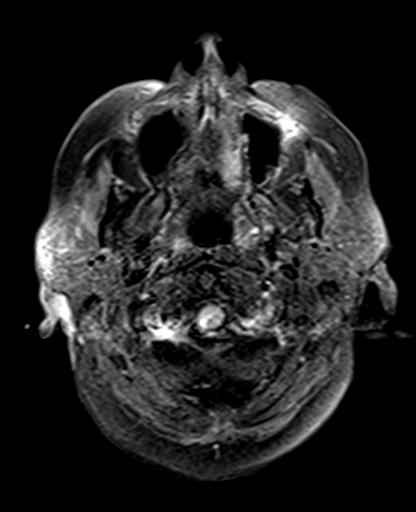
[im 25/25]
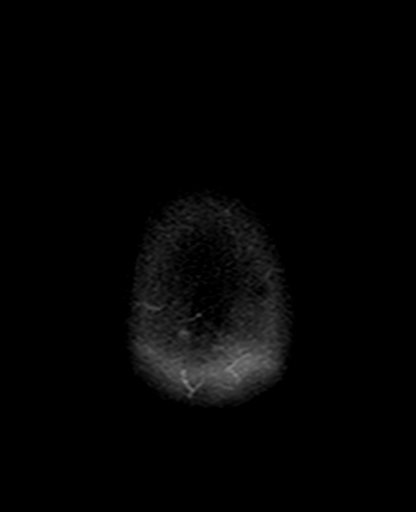

[Series 16: mag_images · axial · 3.0mm · 0.90mm/px · z∈[-160,-10]mm · 3 of 52 slices shown]
[im 1/52]
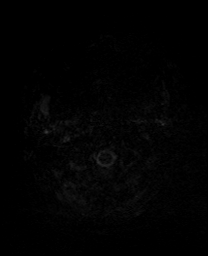
[im 26/52]
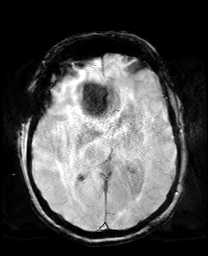
[im 52/52]
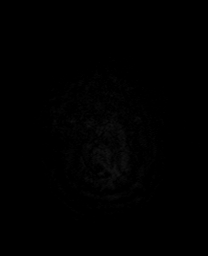

[Series 17: pha_images · axial · 3.0mm · 0.90mm/px · z∈[-160,-10]mm · 3 of 52 slices shown]
[im 1/52]
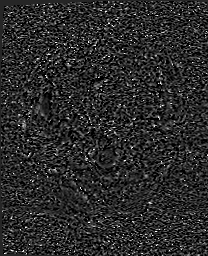
[im 26/52]
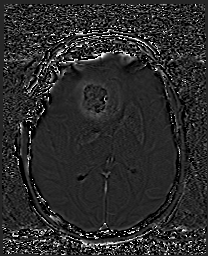
[im 52/52]
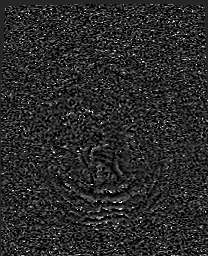

[Series 18: swi_images · axial · 3.0mm · 0.90mm/px · z∈[-160,-10]mm · 3 of 52 slices shown]
[im 1/52]
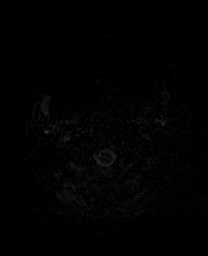
[im 26/52]
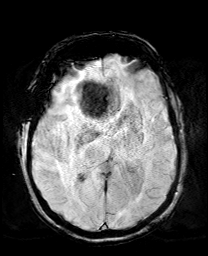
[im 52/52]
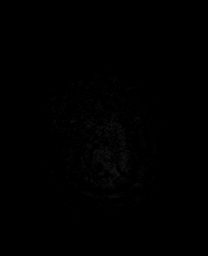

[Series 19: mip_images(sw) · axial · 24.0mm · 0.90mm/px · z∈[-149,-20]mm · 3 of 45 slices shown]
[im 1/45]
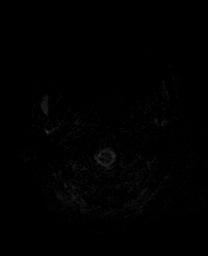
[im 23/45]
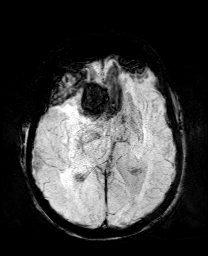
[im 45/45]
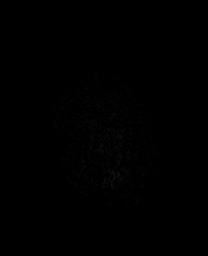

[Series 21: T2 post-contrast · coronal · 5.0mm · 0.72mm/px · 2 of 28 slices shown]
[im 1/28]
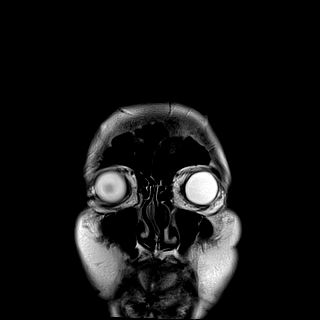
[im 28/28]
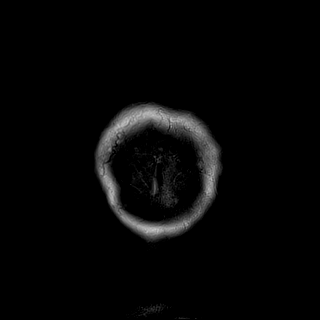

[Series 23: T1 post-contrast · coronal · 5.0mm · 0.34mm/px · 2 of 28 slices shown (1 of 2)]
[im 1/28]
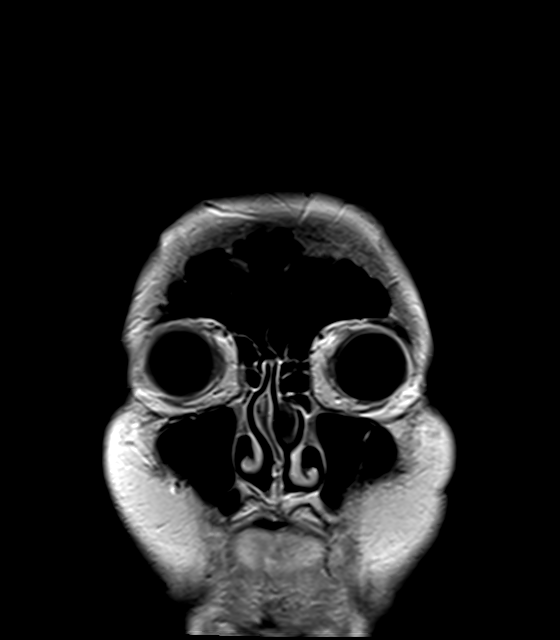
[im 28/28]
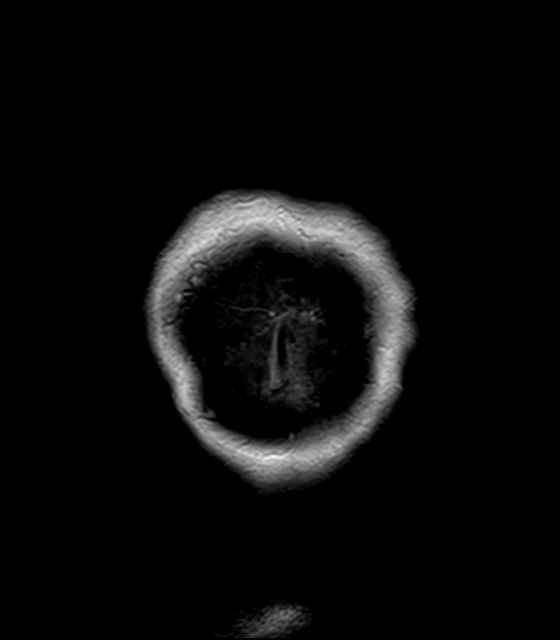

[Series 24: T1 post-contrast · sagittal · 5.0mm · 0.72mm/px · 2 of 27 slices shown (2 of 2)]
[im 1/27]
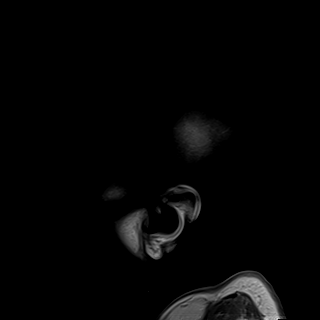
[im 27/27]
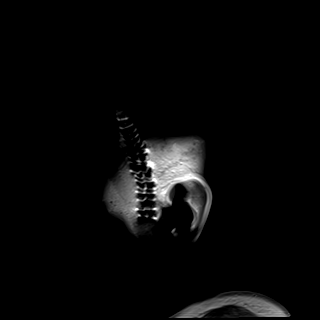

[42 of 48 positions shown; findings below may reference images not displayed]

FINDINGS: Brain: Resected meningioma from the right paramedian anterior
cranial fossa with no nodular residual, only wispy postoperative and
vascular appearing enhancement. The degree of vasogenic edema is
similar, and extensive, with midline shift of at least 1 cm. No
change in ventricular volume with compression of the right lateral
ventricle. No complicating infarct. Expected postoperative thin
fluid collection deep to the craniotomy flap. No measurable
hematoma.

Vascular: Normal flow voids and vascular enhancements.

Skull and upper cervical spine: No evident complication of pterional
craniotomy.

Sinuses/Orbits: Unremarkable
IMPRESSION: Resected meningioma without complicating feature. Vasogenic edema
and midline shift is unchanged.

## 2020-09-12 MED ORDER — DEXAMETHASONE 2 MG PO TABS: 2.0000 mg | ORAL_TABLET | Freq: Two times a day (BID) | ORAL | Status: DC

## 2020-09-12 MED ORDER — DEXAMETHASONE 2 MG PO TABS
2.0000 mg | ORAL_TABLET | Freq: Three times a day (TID) | ORAL | Status: DC
  Administered 2020-09-15 – 2020-09-17 (×7): 2 mg via ORAL
  Filled 2020-09-12 (×7): qty 1

## 2020-09-12 MED ORDER — DEXAMETHASONE 2 MG PO TABS: 1.0000 mg | ORAL_TABLET | Freq: Every day | ORAL | Status: DC

## 2020-09-12 MED ORDER — DEXAMETHASONE 2 MG PO TABS: 1.0000 mg | ORAL_TABLET | Freq: Two times a day (BID) | ORAL | Status: DC

## 2020-09-12 MED ORDER — SODIUM CHLORIDE 0.9 % IV SOLN: INTRAVENOUS | Status: DC

## 2020-09-12 MED ORDER — DEXAMETHASONE 4 MG PO TABS
4.0000 mg | ORAL_TABLET | Freq: Three times a day (TID) | ORAL | Status: AC
  Administered 2020-09-12 – 2020-09-15 (×9): 4 mg via ORAL
  Filled 2020-09-12 (×10): qty 1

## 2020-09-12 MED ORDER — GADOBUTROL 1 MMOL/ML IV SOLN
6.5000 mL | Freq: Once | INTRAVENOUS | Status: AC | PRN
  Administered 2020-09-12: 6.5 mL via INTRAVENOUS

## 2020-09-12 MED FILL — Thrombin For Soln 5000 Unit: CUTANEOUS | Qty: 5000 | Status: AC

## 2020-09-12 NOTE — Evaluation (Signed)
Occupational Therapy Evaluation Patient Details Name: [name] Page MRN: [id number] DOB: March 11, 1951 Today's Date: 09/12/2020    History of Present Illness [name] Page is a 69 y.o. 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 Admitted due to weakness, AMS. MRI revealed 3.3 x 4.1 x 2.4 cm meningioma at the skull base on the right. Mass-effect upon the inferior right frontal lobe. Underwent resection 12/23.   Clinical Impression   Pt admitted with above. She demonstrates the below listed deficits and will benefit from continued OT to maximize safety and independence with BADLs.  Pt presents to OT with generalized weakness, decreased balance, decreased activity tolerance, and cognitive impairment.  She currently requires set up assist - max A for ADLs and mod A +2 for functional transfers.  Recommend CIR level rehab at discharge.       Follow Up Recommendations  CIR    Equipment Recommendations  None recommended by OT    Recommendations for Other Services Rehab consult     Precautions / Restrictions Precautions Precautions: Fall      Mobility Bed Mobility Overal bed mobility: Needs Assistance Bed Mobility: Supine to Sit     Supine to sit: Min assist     General bed mobility comments: minor stability assist.  slow to w/shift and scoot    Transfers Overall transfer level: Needs assistance Equipment used: 2 person hand held assist Transfers: Sit to/from [employer] Sit to Stand: Min assist;+2 physical assistance;+2 safety/equipment Stand pivot transfers: Mod assist;+2 safety/equipment;Min assist;+2 physical assistance       General transfer comment: cues for hand placement, assist forward and minimal boost, light mod assist for pivot to chair or side stepping toward HOB.    Balance Overall balance assessment: Needs assistance Sitting-balance  support: Single extremity supported;No upper extremity supported;Feet supported Sitting balance-Leahy Scale: Fair Sitting balance - Comments: sluggish w/shift and upright posture when trying to put on socks, slow list posteriorly with challenge donning socks   Standing balance support: Single extremity supported;Bilateral upper extremity supported Standing balance-Leahy Scale: Poor Standing balance comment: reliant on external support                           ADL either performed or assessed with clinical judgement   ADL Overall ADL's : Needs assistance/impaired Eating/Feeding: Set up;Sitting   Grooming: Wash/dry hands;Wash/dry face;Oral care;Minimal assistance;Sitting   Upper Body Bathing: Minimal assistance;Sitting   Lower Body Bathing: Moderate assistance;Sit to/from stand   Upper Body Dressing : Minimal assistance;Sitting   Lower Body Dressing: Moderate assistance;Sit to/from stand   Toilet Transfer: Moderate assistance;+2 for physical assistance;+2 for safety/equipment;Stand-pivot;BSC   Toileting- Clothing Manipulation and Hygiene: Maximal assistance;Sit to/from stand       Functional mobility during ADLs: Moderate assistance;+2 for physical assistance;+2 for safety/equipment       Vision Baseline Vision/History: Wears glasses Wears Glasses: At all times Vision Assessment?: Yes Eye Alignment: Within Functional Limits Ocular Range of Motion: Within Functional Limits Visual Fields: No apparent deficits Additional Comments: Pt does not have glasses present and reports poor acuity without glasses     Perception Perception Perception Tested?: Yes   Praxis Praxis Praxis tested?: Within functional limits    Pertinent Vitals/Pain Pain Assessment: Faces Faces Pain Scale: No hurt     Hand Dominance Right   Extremity/Trunk Assessment Upper Extremity Assessment Upper Extremity Assessment: RUE deficits/detail;LUE deficits/detail RUE Deficits /  Details:  Rt UE tremulous.  AROM WFL grossly 4/5 RUE Coordination: decreased fine motor LUE Deficits / Details: Pt with h/o Rt shoulder injury with very limited ROM of shoulder.  Elbow distally WFL, mildly tremulous LUE Coordination: decreased fine motor   Lower Extremity Assessment Lower Extremity Assessment: Defer to PT evaluation   Cervical / Trunk Assessment Cervical / Trunk Assessment: Normal   Communication Communication Communication: No difficulties   Cognition Arousal/Alertness: Awake/alert Behavior During Therapy: WFL for tasks assessed/performed Overall Cognitive Status: Impaired/Different from baseline Area of Impairment: Attention;Problem solving                   Current Attention Level: Selective         Problem Solving: Requires verbal cues;Difficulty sequencing;Slow processing     General Comments  SpO2 96% on RA,  HR in the mid 80's.  pt report some dizziness through out session, but diminishing with time upright.    Exercises     Shoulder Instructions      Home Living Family/patient expects to be discharged to:: Private residence Living Arrangements: Alone Available Help at Discharge: Family;Available 24 hours/day Type of Home: Mobile home Home Access: Stairs to enter Entrance Stairs-Number of Steps: 9 Entrance Stairs-Rails: Right;Left       Bathroom Shower/Tub: Occupational psychologist: Handicapped height     Home Equipment: None   Additional Comments: Many family member live adjacent to her, sister does not work and can assist  Lives With: Alone    Prior Functioning/Environment Level of Independence: Independent        Comments: drove and completed her ADL's PTA        OT Problem List: Decreased strength;Decreased activity tolerance;Impaired balance (sitting and/or standing);Decreased coordination;Decreased cognition;Decreased safety awareness      OT Treatment/Interventions: Self-care/ADL training;Therapeutic  exercise;Neuromuscular education;DME and/or AE instruction;Therapeutic activities;Cognitive remediation/compensation;Visual/perceptual remediation/compensation;Patient/family education;Balance training    OT Goals(Current goals can be found in the care plan section) Acute Rehab OT Goals Patient Stated Goal: I need to get stronger, I'd like to be independent OT Goal Formulation: With patient Time For Goal Achievement: 09/26/20 Potential to Achieve Goals: Good ADL Goals Pt Will Perform Grooming: with min guard assist;standing Pt Will Perform Upper Body Bathing: with set-up;with supervision;sitting Pt Will Perform Lower Body Bathing: with min guard assist;sit to/from stand Pt Will Perform Upper Body Dressing: with set-up;with supervision;sitting Pt Will Perform Lower Body Dressing: with min guard assist;sit to/from stand Pt Will Transfer to Toilet: with min guard assist;ambulating;regular height toilet Pt Will Perform Toileting - Clothing Manipulation and hygiene: with min guard assist;sit to/from stand Additional ADL Goal #1: pt will divide attention with min cues during familiar ADLs  OT Frequency: Min 2X/week   Barriers to D/C:            Co-evaluation PT/OT/SLP Co-Evaluation/Treatment: Yes Reason for Co-Treatment: Necessary to address cognition/behavior during functional activity;For patient/therapist safety;To address functional/ADL transfers PT goals addressed during session: Mobility/safety with mobility OT goals addressed during session: ADL's and self-care      AM-PAC OT "6 Clicks" Daily Activity     Outcome Measure Help from another person eating meals?: A Little Help from another person taking care of personal grooming?: A Little Help from another person toileting, which includes using toliet, bedpan, or urinal?: A Lot Help from another person bathing (including washing, rinsing, drying)?: A Lot Help from another person to put on and taking off regular upper body  clothing?: A Little Help from another  person to put on and taking off regular lower body clothing?: A Lot 6 Click Score: 15   End of Session Nurse Communication: Mobility status  Activity Tolerance: Patient tolerated treatment well Patient left: in chair;with call bell/phone within reach;with chair alarm set  OT Visit Diagnosis: Unsteadiness on feet (R26.81)                Time: 4665-9935 OT Time Calculation (min): 31 min Charges:  OT General Charges $OT Visit: 1 Visit OT Evaluation $OT Eval Moderate Complexity: 1 Mod  Tiombe Tomeo C., OTR/L Acute Rehabilitation Services Pager [phone] Office [phone]   Lucille Passy M 09/12/2020, 6:00 PM

## 2020-09-12 NOTE — Evaluation (Signed)
Physical Therapy Evaluation Patient Details Name: [name] Page MRN: [id number] DOB: 1951/01/16 Today's Date: 09/12/2020   History of Present Illness  [name] Page is a 69 y.o. 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 Admitted due to weakness, AMS. MRI revealed 3.3 x 4.1 x 2.4 cm meningioma at the skull base on the right. Mass-effect upon the inferior right frontal lobe. Underwent resection 12/23.  Clinical Impression  Pt admitted with/for weakness/AMS revealed due to meningioma, s/p resection.  Pt needing min to light moderate assist for basic mobility at this time without full assessment of gait stability.  Pt currently limited functionally due to the problems listed. ( See problems list.)   Pt will benefit from PT to maximize function and safety in order to get ready for next venue listed below.     Follow Up Recommendations CIR;Supervision/Assistance - 24 hour    Equipment Recommendations  Other (comment);Rolling walker with 5" wheels (TBD)    Recommendations for Other Services Rehab consult     Precautions / Restrictions Precautions Precautions: Fall      Mobility  Bed Mobility Overal bed mobility: Needs Assistance Bed Mobility: Supine to Sit     Supine to sit: Min assist     General bed mobility comments: minor stability assist.  slow to w/shift and scoot    Transfers     Transfers: Sit to/from [employer] Sit to Stand: Min assist;+2 physical assistance;+2 safety/equipment Stand pivot transfers: Mod assist;+2 safety/equipment;Min assist;+2 physical assistance       General transfer comment: cues for hand placement, assist forward and minimal boost, light mod assist for pivot to chair or side stepping toward HOB.  Ambulation/Gait                Stairs            Wheelchair Mobility    Modified Rankin (Stroke Patients  Only)       Balance Overall balance assessment: Needs assistance Sitting-balance support: Single extremity supported;No upper extremity supported;Feet supported Sitting balance-Leahy Scale: Fair Sitting balance - Comments: sluggish w/shift and upright posture when trying to put on socks, slow list posteriorly with challenge donning socks   Standing balance support: Single extremity supported;Bilateral upper extremity supported Standing balance-Leahy Scale: Poor Standing balance comment: reliant on external support                             Pertinent Vitals/Pain Faces Pain Scale: No hurt    Home Living Family/patient expects to be discharged to:: Private residence Living Arrangements: Alone Available Help at Discharge: Family;Available 24 hours/day Type of Home: Mobile home Home Access: Stairs to enter Entrance Stairs-Rails: Right;Left Entrance Stairs-Number of Steps: 9   Home Equipment: None Additional Comments: Many family member live adjacent to her, sister does not work and can assist    Prior Function Level of Independence: Independent         Comments: drove and completed her ADL's PTA     Hand Dominance        Extremity/Trunk Assessment   Upper Extremity Assessment Upper Extremity Assessment: Defer to OT evaluation    Lower Extremity Assessment Lower Extremity Assessment: Generalized weakness       Communication   Communication: No difficulties  Cognition Arousal/Alertness: Awake/alert Behavior During Therapy: WFL for tasks assessed/performed Overall Cognitive Status: Impaired/Different from baseline Area of Impairment:  Attention                   Current Attention Level: Sustained                  General Comments General comments (skin integrity, edema, etc.): SpO2 96% on RA,  HR in the mid 80's.  pt report some dizziness through out session, but diminishing with time upright.    Exercises     Assessment/Plan     PT Assessment Patient needs continued PT services  PT Problem List Decreased strength;Decreased range of motion;Decreased activity tolerance;Decreased balance;Decreased mobility;Decreased coordination;Decreased knowledge of use of DME       PT Treatment Interventions Gait training;DME instruction;Stair training;Functional mobility training;Therapeutic activities;Balance training;Patient/family education;Neuromuscular re-education    PT Goals (Current goals can be found in the Care Plan section)  Acute Rehab PT Goals Patient Stated Goal: I need to get stronger, I'd like to be independent PT Goal Formulation: With patient Time For Goal Achievement: 09/26/20 Potential to Achieve Goals: Good    Frequency Min 3X/week   Barriers to discharge        Co-evaluation PT/OT/SLP Co-Evaluation/Treatment: Yes Reason for Co-Treatment: For patient/therapist safety;To address functional/ADL transfers PT goals addressed during session: Mobility/safety with mobility         AM-PAC PT "6 Clicks" Mobility  Outcome Measure Help needed turning from your back to your side while in a flat bed without using bedrails?: A Little Help needed moving from lying on your back to sitting on the side of a flat bed without using bedrails?: A Little Help needed moving to and from a bed to a chair (including a wheelchair)?: A Lot Help needed standing up from a chair using your arms (e.g., wheelchair or bedside chair)?: A Little Help needed to walk in hospital room?: A Lot Help needed climbing 3-5 steps with a railing? : A Lot 6 Click Score: 15    End of Session   Activity Tolerance: Patient tolerated treatment well;Patient limited by fatigue Patient left: in chair;with call bell/phone within reach;with chair alarm set Nurse Communication: Mobility status PT Visit Diagnosis: Unsteadiness on feet (R26.81);Other abnormalities of gait and mobility (R26.89);Other symptoms and signs involving the nervous system  (R29.898)    Time: 2440-1027 PT Time Calculation (min) (ACUTE ONLY): 31 min   Charges:   PT Evaluation $PT Eval Moderate Complexity: 1 Mod          09/12/2020  [name] Carne., PT Acute Rehabilitation Services [phone]  (pager) [phone]  (office)  [name] Page 09/12/2020, 4:44 PM

## 2020-09-12 NOTE — Consult Note (Signed)
WOC Nurse Consult Note: consult performed remotely with review of chart and images  Reason for Consult: skin lesion Reviewed images and chart.  Patient with known SCC, caring for them at home. Hides lesions from family Wound type: squamous cell carcinoma  Pressure Injury POA: NA Measurement: requested staff to enter on nursing flow sheet per polity Wound bed: raised, pink, fungating lesions Drainage (amount, consistency, odor) scant to minimal per nursing flow sheets Periwound: minimal Dressing procedure/placement/frequency: Single layer of xeroform gauze as non adherent and silicone foam topper to protect and insulate.   Long term wound care needed with follow up with oncologist or dermatologist   Re consult if needed, will not follow at this time. Thanks  Aranza Geddes [employer], RN,CWOCN, CNS, [place] [phone])

## 2020-09-12 NOTE — Progress Notes (Signed)
Subjective: The patient is alert and pleasant.  She has no complaints.  Objective: Vital signs in last 24 hours: Temp:  [97.5 F (36.4 C)-98.8 F (37.1 C)] 98.4 F (36.9 C) (12/24 0800) Pulse Rate:  [62-91] 76 (12/24 0800) Resp:  [10-23] 12 (12/24 0800) BP: (115-196)/(55-72) 117/59 (12/24 0800) SpO2:  [90 %-98 %] 97 % (12/24 0800) Arterial Line BP: (99-222)/(43-84) 140/58 (12/24 0800) Estimated body mass index is 25.77 kg/m as calculated from the following:   Height as of this encounter: 5\' 2"  (1.575 m).   Weight as of this encounter: 63.9 kg.   Intake/Output from previous day: 12/23 0701 - 12/24 0700 In: 5273.2 [I.V.:4180.4; Blood:315; IV Piggyback:527.8] Out: 5093 [Urine:2825; Blood:700] Intake/Output this shift: No intake/output data recorded.  Physical exam the patient is alert and oriented.  Her speech is normal.  She is moving all 4 extremities well.  Her pupils are equal.  I have reviewed the patient's follow-up brain MRI performed today.  It demonstrates a good resection of her meningioma.  She has continued midline shift and edema as expected.  Lab Results: Recent Labs    09/11/20 1503 09/12/20 0534  WBC  --  11.3*  HGB 8.8* 9.6*  HCT 26.0* 31.4*  PLT  --  153   BMET Recent Labs    09/10/20 0246 09/11/20 0958 09/11/20 1503 09/12/20 0534  NA 136   < > 139 139  K 4.4   < > 4.4 4.6  CL 105  --   --  112*  CO2 23  --   --  21*  GLUCOSE 133*  --   --  123*  BUN 30*  --   --  30*  CREATININE 1.14*  --   --  1.15*  CALCIUM 8.3*  --   --  7.0*   < > = values in this interval not displayed.    Studies/Results: MR BRAIN W WO CONTRAST  Result Date: 09/12/2020 CLINICAL DATA:  Meningioma resection. EXAM: MRI HEAD WITHOUT AND WITH CONTRAST TECHNIQUE: Multiplanar, multiecho pulse sequences of the brain and surrounding structures were obtained without and with intravenous contrast. CONTRAST:  6.60mL GADAVIST GADOBUTROL 1 MMOL/ML IV SOLN COMPARISON:  Seven days  ago FINDINGS: Brain: Resected meningioma from the right paramedian anterior cranial fossa with no nodular residual, only wispy postoperative and vascular appearing enhancement. The degree of vasogenic edema is similar, and extensive, with midline shift of at least 1 cm. No change in ventricular volume with compression of the right lateral ventricle. No complicating infarct. Expected postoperative thin fluid collection deep to the craniotomy flap. No measurable hematoma. Vascular: Normal flow voids and vascular enhancements. Skull and upper cervical spine: No evident complication of pterional craniotomy. Sinuses/Orbits: Unremarkable IMPRESSION: Resected meningioma without complicating feature. Vasogenic edema and midline shift is unchanged. Electronically Signed   By: Monte Fantasia M.D.   On: 09/12/2020 07:23    Assessment/Plan: Postop day #1: She was doing well.  She is okay to be transferred to these progressive side.  We will decrease see her Foley and A-line.  We will hopefully be able to wean her off her drips..   LOS: 8 days     Ophelia Charter 09/12/2020, 8:50 AM

## 2020-09-12 NOTE — Evaluation (Signed)
Speech Language Pathology Evaluation Patient Details Name: [name] Page MRN: [id number] DOB: 1951/06/09 Today's Date: 09/12/2020 Time: 1000-1016 SLP Time Calculation (min) (ACUTE ONLY): 16 min  Problem List:  Patient Active Problem List   Diagnosis Date Noted   CKD (chronic kidney disease), stage III ([place]) 09/10/2020   AMS (altered mental status) 09/04/2020   Vasogenic Cerebral edema ([place]) 09/04/2020   Meningioma ([place]) 09/04/2020   Generalized weakness 09/04/2020   Cancer of skin, squamous cell 09/04/2020   Nephrolithiasis 09/04/2020   UTI (urinary tract infection) 09/04/2020   Elevated blood pressure 11/01/2015   Osteoporosis 10/30/2015   Past Medical History:  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   sees Dr Allyson Sabal   Cancer [facility])    melanoma   Dysuria    Frequency of urination    Hematuria    History of kidney stones    Osteoporosis    Renal calculus, bilateral    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   Urgency of urination    Wears glasses    Past Surgical History:  Past Surgical History:  Procedure Laterality Date   CYSTOSCOPY WITH RETROGRADE PYELOGRAM, URETEROSCOPY AND STENT PLACEMENT Bilateral 08/11/2013   Procedure: CYSTOSCOPY WITH RETROGRADE PYELOGRAM, URETEROSCOPY AND STENT PLACEMENT;  Surgeon: Alexis Frock, MD;  Location: [facility];  Service: Urology;  Laterality: Bilateral;   CYSTOSCOPY WITH URETEROSCOPY AND STENT PLACEMENT Bilateral 08/29/2013   Procedure: CYSTOSCOPY WITH BILATERAL URETEROSCOPY AND STENT REPLACEMENTS, stone extraction;  Surgeon: Alexis Frock, MD;  Location: [facility];  Service: Urology;  Laterality: Bilateral;   CYSTOSCOPY/RETROGRADE/URETEROSCOPY/STONE EXTRACTION WITH BASKET Bilateral 01/31/2016   Procedure: CYSTOSCOPY/BILATERAL RETROGRADE PYELOGRAMS/BILATERAL URETEROSCOPY/BILATERAL STONE EXTRACTION WITH  BASKET/RIGHT URETERAL STENT PLACEMENT;  Surgeon: Raynelle Bring, MD;  Location: [facility];  Service: Urology;  Laterality: Bilateral;   HEMIARTHROPLASTY SHOULDER FRACTURE Left 1992   HOLMIUM LASER APPLICATION Bilateral 43/15/4008   Procedure: HOLMIUM LASER APPLICATION;  Surgeon: Alexis Frock, MD;  [facility];  Service: Urology;  Laterality: Bilateral;   HPI:  [name] Page is a 69 y.o. 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 Admitted due to weakness, AMS. MRI revealed 3.3 x 4.1 x 2.4 cm meningioma at the skull base on the right. Mass-effect upon the inferior right frontal lobe. Underwent resection 12/23.   Assessment / Plan / Recommendation Clinical Impression  Pt is alert, pleasant and lives alone with daughter next door. Given the SLUMS she scored a 14/30 wth impairments in mental calculation, visuospatial, problem solving, and auditory retention. Working memory she recalled 4 of 5 words with 4-5 min delay. Speech and language are intact. She is oriented x 4. She will benefit from continued therapy to facilitate cognitive independence but needs full supervision presently.    SLP Assessment  SLP Recommendation/Assessment: Patient needs continued Speech Lanaguage Pathology Services SLP Visit Diagnosis: Cognitive communication deficit (R41.841)    Follow Up Recommendations  Inpatient Rehab    Frequency and Duration min 2x/week  2 weeks      SLP Evaluation Cognition  Overall Cognitive Status: Impaired/Different from baseline Arousal/Alertness: Awake/alert Orientation Level: Oriented X4 Attention: Sustained Sustained Attention: Appears intact Memory: Impaired Memory Impairment: Retrieval deficit (4/5) Awareness: Impaired Awareness Impairment: Intellectual impairment Problem Solving: Impaired Problem Solving Impairment: Verbal basic Executive  Function: Self Monitoring;Self Correcting  Safety/Judgment: Impaired       Comprehension  Auditory Comprehension Overall Auditory Comprehension: Appears within functional limits for tasks assessed Visual Recognition/Discrimination Discrimination: Not tested Reading Comprehension Reading Status: Not tested    Expression Expression Primary Mode of Expression: Verbal Verbal Expression Overall Verbal Expression: Appears within functional limits for tasks assessed Pragmatics: No impairment Written Expression Dominant Hand: Right Written Expression: Exceptions to [facility] Self Formulation Ability:  (clock)   Oral / Motor  Oral Motor/Sensory Function Overall Oral Motor/Sensory Function: Within functional limits Motor Speech Overall Motor Speech: Appears within functional limits for tasks assessed Intelligibility: Intelligible Motor Planning: Witnin functional limits   GO                    Houston Siren 09/12/2020, 10:42 AM  Orbie Pyo Colvin Caroli.Ed Risk analyst [phone] Office [phone]

## 2020-09-12 NOTE — Progress Notes (Signed)
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  She was recently started on iron supplements for anemia.  Apparently she was scheduled for EGD and colonoscopy on 09/05/20.  Her daughter reports she has been confused, forgetful and been complaining about headache recently, Patient reports intermittent headache for almost a year which has recently gotten worse.  Head CT showed anterior cranial fossa meningioma causing severe vasogenic edema within the right frontal lobe, leftward midline shift with subfalcine herniation of the right cingulate gyrus with early entrapment of the left lateral ventricle.  Neurosurgery was consulted by ED physician who recommended to initiate Decadron 10 mg daily and patient underwent meningioma removal on 12/23 without major complications.    She is also found to have a 3 mm stone without significant hydronephrosis,  Dr. Caprice Beaver, urologist was consulted,  recommended course of antibiotics which she completed.  This morning, seen in ICU.  She is awake and alert.  Minimal pain.  She is tired.  Assessment/Plan: Principal Problem:   Meningioma [facility]): Looks to be causing her intermittent headaches for the past year.  MRI and CT noted large meningioma causing mass-effect.  Started on IV dexamethasone and Keppra.  Status post resection of mass on 12/23.  Stable.  Neurosurgery following.  Demonstrated some issues with swallowing.  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     Cancer of skin, squamous cell: Squamous cell of skin of chest, right breast and lower back.  Initially had been seeing a dermatologist, but since then had stopped following up.  Daughter was unaware lesions had grown in size and will discuss with oncology, but likely will end up setting up outpatient follow-up with dermatology upon discharge.  Dermatology does not come into the hospital.   Stage IIIa chronic kidney disease: Looks to be at baseline    UTI (urinary tract infection): Urinalysis with moderate leukocytosis and CT of abdomen bilateral renal stones, but none obstruction without significant hydroureter.  Completed 3-day course of IV Rocephin.  Insignificant growth in culture.  Discussed with urology  Iron deficiency anemia: Recently found to be anemic and started on iron supplement.  Was supposed to have EGD and colonoscopy done on 12/17.  Hemoglobin down to 8, but since then has come up on its own.  Plan is to reschedule outpatient scope although may need inpatient consultation if hemoglobin has dropped.  Code Status: Partial code  Family Communication: Daughter at bedside  Disposition Plan: Anticipate discharge to home   Consultants:  Neurosurgery  Urology  Procedures:  Planned meningioma resection 12/23  Antimicrobials:  IV Rocephin 12/16-12/19  DVT prophylaxis: SCDs   Objective: Vitals:   09/12/20 1000 09/12/20 1202  BP: 127/88 127/60  Pulse: 82 79  Resp: 15 16  Temp:  98.2 F (36.8 C)  SpO2: 97%     Intake/Output Summary (Last 24 hours) at 09/12/2020 1311 Last data filed at 09/12/2020 1000 Gross per 24 hour  Intake 3619.01 ml  Output 1350 ml  Net 2269.01 ml  Filed Weights   09/07/20 0433  Weight: 63.9 kg   Body mass index is 25.77 kg/m.  Exam:   General: Alert and oriented x2, no acute distress  HEENT: Status post right peroneal craniotomy, mucous membranes slightly dry  Cardiovascular:  Regular rate and rhythm, S1-S2  Lungs: Clear auscultation bilaterally  Abdomen: Soft, nontender, nondistended, hypoactive bowel sounds  Extremities: No clubbing or cyanosis, or edema  Neuro: Essential tremor  Psychiatry: Patient is appropriate, no evidence of psychoses   Data Reviewed: CBC: Recent Labs  Lab 09/06/20 0312 09/07/20 0108 09/08/20 0240 09/09/20 0215 09/10/20 0246 09/11/20 1052 09/11/20 1224 09/11/20 1409 09/11/20 1503 09/12/20 0534  WBC 9.0 7.9 6.5 6.9  --   --   --   --   --  11.3*  NEUTROABS 8.1* 6.9 5.5 5.8  --   --   --   --   --  9.9*  HGB 7.9* 7.7* 8.0* 8.5*   < > 10.5* 9.9* 9.5* 8.8* 9.6*  HCT 27.7* 27.0* 27.6* 28.1*   < > 31.0* 29.0* 28.0* 26.0* 31.4*  MCV 82.0 82.1 81.9 81.2  --   --   --   --   --  80.1  PLT 183 186 176 197  --   --   --   --   --  153   < > = values in this interval not displayed.   Basic Metabolic Panel: Recent Labs  Lab 09/07/20 0108 09/08/20 0240 09/09/20 0215 09/10/20 0246 09/11/20 0958 09/11/20 1052 09/11/20 1224 09/11/20 1409 09/11/20 1503 09/12/20 0534  NA 137 138 139 136   < > 135 136 138 139 139  K 3.9 4.3 4.3 4.4   < > 4.5 4.3 4.4 4.4 4.6  CL 107 106 107 105  --   --   --   --   --  112*  CO2 23 23 23 23   --   --   --   --   --  21*  GLUCOSE 148* 140* 136* 133*  --   --   --   --   --  123*  BUN 31* 32* 30* 30*  --   --   --   --   --  30*  CREATININE 1.29* 1.15* 1.08* 1.14*  --   --   --   --   --  1.15*  CALCIUM 8.2* 8.4* 8.6* 8.3*  --   --   --   --   --  7.0*  MG  --   --  1.9  --   --   --   --   --   --   --   PHOS  --   --  3.8  --   --   --   --   --   --   --    < > = values in this interval not displayed.   GFR: Estimated Creatinine Clearance: 40 mL/min (A) (by C-G formula based on SCr of 1.15 mg/dL (H)). Liver Function Tests: No results for input(s): AST, ALT, ALKPHOS, BILITOT, PROT, ALBUMIN in the last 168 hours. No results for input(s): LIPASE, AMYLASE in the last 168 hours. No results for  input(s): AMMONIA in the last 168 hours. Coagulation Profile: No results for input(s): INR, PROTIME in the last 168 hours. Cardiac Enzymes: No results for input(s): CKTOTAL, CKMB, CKMBINDEX, TROPONINI in the last 168 hours. BNP (last 3 results) No results for input(s): PROBNP in  the last 8760 hours. HbA1C: No results for input(s): HGBA1C in the last 72 hours. CBG: No results for input(s): GLUCAP in the last 168 hours. Lipid Profile: No results for input(s): CHOL, HDL, LDLCALC, TRIG, CHOLHDL, LDLDIRECT in the last 72 hours. Thyroid Function Tests: No results for input(s): TSH, T4TOTAL, FREET4, T3FREE, THYROIDAB in the last 72 hours. Anemia Panel: No results for input(s): VITAMINB12, FOLATE, FERRITIN, TIBC, IRON, RETICCTPCT in the last 72 hours. Urine analysis:    Component Value Date/Time   COLORURINE YELLOW 09/04/2020 1659   APPEARANCEUR HAZY (A) 09/04/2020 1659   LABSPEC 1.016 09/04/2020 1659   PHURINE 6.0 09/04/2020 1659   GLUCOSEU NEGATIVE 09/04/2020 1659   HGBUR LARGE (A) 09/04/2020 1659   BILIRUBINUR NEGATIVE 09/04/2020 1659   KETONESUR 20 (A) 09/04/2020 1659   PROTEINUR 100 (A) 09/04/2020 1659   UROBILINOGEN 0.2 07/04/2014 0406   NITRITE NEGATIVE 09/04/2020 1659   LEUKOCYTESUR MODERATE (A) 09/04/2020 1659   Sepsis Labs: @LABRCNTIP (procalcitonin:4,lacticidven:4)  ) Recent Results (from the past 240 hour(s))  Urine culture     Status: Abnormal   Collection Time: 09/04/20  5:24 PM   Specimen: Urine, Clean Catch  Result Value Ref Range Status   Specimen Description   Final    URINE, CLEAN CATCH Performed at [facility], [address] [place], [place] [address]    Special Requests   Final    NONE Performed at [facility], [address], [place], [place] [address]    Culture (A)  Final    <10,000 COLONIES/mL INSIGNIFICANT GROWTH Performed at [facility], [place] [address] [place], [place] [address]    Report Status 09/06/2020 FINAL  Final  Resp Panel by  RT-PCR (Flu A&B, Covid) Nasopharyngeal Swab     Status: None   Collection Time: 09/04/20  6:47 PM   Specimen: Nasopharyngeal Swab; Nasopharyngeal(NP) swabs in vial transport medium  Result Value Ref Range Status   SARS Coronavirus 2 by RT PCR NEGATIVE NEGATIVE Final    Comment: (NOTE) SARS-CoV-2 target nucleic acids are NOT DETECTED.  The SARS-CoV-2 RNA is generally detectable in upper respiratory specimens during the acute phase of infection. The lowest concentration of SARS-CoV-2 viral copies this assay can detect is 138 copies/mL. A negative result does not preclude SARS-Cov-2 infection and should not be used as the sole basis for treatment or other patient management decisions. A negative result may occur with  improper specimen collection/handling, submission of specimen other than nasopharyngeal swab, presence of viral mutation(s) within the areas targeted by this assay, and inadequate number of viral copies(<138 copies/mL). A negative result must be combined with clinical observations, patient history, and epidemiological information. The expected result is Negative.  Fact Sheet for Patients:  EntrepreneurPulse.com.au  Fact Sheet for Healthcare Providers:  IncredibleEmployment.be  This test is no t yet approved or cleared by the [place] FDA and  has been authorized for detection and/or diagnosis of SARS-CoV-2 by FDA under an Emergency Use Authorization (EUA). This EUA will remain  in effect (meaning this test can be used) for the duration of the COVID-19 declaration under Section 564(b)(1) of the Act, 21 U.S.C.section 360bbb-3(b)(1), unless the authorization is terminated  or revoked sooner.       Influenza A by PCR NEGATIVE NEGATIVE Final   Influenza B by PCR NEGATIVE NEGATIVE Final    Comment: (NOTE) The Xpert Xpress SARS-CoV-2/FLU/RSV plus assay is intended as an aid in the diagnosis of influenza from Nasopharyngeal swab  specimens and should not  be used as a sole basis for treatment. Nasal washings and aspirates are unacceptable for Xpert Xpress SARS-CoV-2/FLU/RSV testing.  Fact Sheet for Patients: EntrepreneurPulse.com.au  Fact Sheet for Healthcare Providers: IncredibleEmployment.be  This test is not yet approved or cleared by the [place] FDA and has been authorized for detection and/or diagnosis of SARS-CoV-2 by FDA under an Emergency Use Authorization (EUA). This EUA will remain in effect (meaning this test can be used) for the duration of the COVID-19 declaration under Section 564(b)(1) of the Act, 21 U.S.C. section 360bbb-3(b)(1), unless the authorization is terminated or revoked.  Performed at [facility], [address] [place], [place] [address]   MRSA PCR Screening     Status: None   Collection Time: 09/10/20  5:18 PM   Specimen: Nasal Mucosa; Nasopharyngeal  Result Value Ref Range Status   MRSA by PCR NEGATIVE NEGATIVE Final    Comment:        The GeneXpert MRSA Assay (FDA approved for NASAL specimens only), is one component of a comprehensive MRSA colonization surveillance program. It is not intended to diagnose MRSA infection nor to guide or monitor treatment for MRSA infections. Performed at [facility], [place] [address] [place], [place] [address]       Studies: MR BRAIN W WO CONTRAST  Result Date: 09/12/2020 CLINICAL DATA:  Meningioma resection. EXAM: MRI HEAD WITHOUT AND WITH CONTRAST TECHNIQUE: Multiplanar, multiecho pulse sequences of the brain and surrounding structures were obtained without and with intravenous contrast. CONTRAST:  6.77mL GADAVIST GADOBUTROL 1 MMOL/ML IV SOLN COMPARISON:  Seven days ago FINDINGS: Brain: Resected meningioma from the right paramedian anterior cranial fossa with no nodular residual, only wispy postoperative and vascular appearing enhancement. The degree of vasogenic edema is similar, and  extensive, with midline shift of at least 1 cm. No change in ventricular volume with compression of the right lateral ventricle. No complicating infarct. Expected postoperative thin fluid collection deep to the craniotomy flap. No measurable hematoma. Vascular: Normal flow voids and vascular enhancements. Skull and upper cervical spine: No evident complication of pterional craniotomy. Sinuses/Orbits: Unremarkable IMPRESSION: Resected meningioma without complicating feature. Vasogenic edema and midline shift is unchanged. Electronically Signed   By: Monte Fantasia M.D.   On: 09/12/2020 07:23    Scheduled Meds: . Chlorhexidine Gluconate Cloth  6 each Topical Daily  . dexamethasone  4 mg Oral Q8H   Followed by  . [START ON 09/15/2020] dexamethasone  2 mg Oral Q8H   Followed by  . [START ON 09/18/2020] dexamethasone  2 mg Oral Q12H   Followed by  . [START ON 09/21/2020] dexamethasone  1 mg Oral Q12H   Followed by  . [START ON 09/24/2020] dexamethasone  1 mg Oral Daily  . docusate sodium  100 mg Oral BID  . enoxaparin (LOVENOX) injection  40 mg Subcutaneous Q24H  . ferrous sulfate  325 mg Oral Q breakfast  . levETIRAcetam  500 mg Oral BID  . pantoprazole  40 mg Oral Daily    Continuous Infusions: . sodium chloride    .  ceFAZolin (ANCEF) IV 100 mL/hr at 09/12/20 1000  . niCARDipine Stopped (09/12/20 7425)     LOS: 8 days     Annita Brod, MD [facility]   09/12/2020, 1:11 PM

## 2020-09-12 NOTE — Progress Notes (Signed)
Neurosurgery  Patient seen and examined.  O x 3, VFC. FC x 4, no drift. Incision c/d  S/p resection of anterior skull base meningioma - downgrade - dex taper - wound care for her necrotic skin lesions  Discussed plan of care with daughter.

## 2020-09-13 NOTE — Progress Notes (Signed)
BP (!) 151/68 (BP Location: Left Arm)   Pulse 74   Temp 97.8 F (36.6 C) (Oral)   Resp 16   Ht 5\' 2"  (1.575 m)   Wt 63.9 kg   SpO2 97%   BMI 25.77 kg/m  Alert following commands, speech is clear, fluent Moving all extremities well Wound is clean, dry I removed the hemovac and dressings Doing well

## 2020-09-13 NOTE — Progress Notes (Signed)
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  She was recently started on iron supplements for anemia.  Apparently she was scheduled for EGD and colonoscopy on 09/05/20.  Her daughter reports she has been confused, forgetful and been complaining about headache recently, Patient reports intermittent headache for almost a year which has recently gotten worse.  Head CT showed anterior cranial fossa meningioma causing severe vasogenic edema within the right frontal lobe, leftward midline shift with subfalcine herniation of the right cingulate gyrus with early entrapment of the left lateral ventricle.  Neurosurgery was consulted by ED physician who recommended to initiate Decadron 10 mg daily and patient underwent meningioma removal on 12/23 without major complications.    She is also found to have a 3 mm stone without significant hydronephrosis,  Dr. Nolen MuMcKinney, urologist was consulted,  recommended course of antibiotics which she completed.  Physical and occupational therapy recommending inpatient rehab.  Consult pending.  Patient doing well this morning.  Swallowing has improved.  Minimal pain, controlled.  Assessment/Plan: Principal Problem:   Meningioma [facility](HCC): Looks to be causing her intermittent headaches for the past year.  MRI and CT noted large meningioma causing mass-effect.  Started on IV dexamethasone and Keppra.  Status post resection of mass on 12/23 by neurosurgery.  Stable and now inpatient rehab consult pending.  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  Looks to be dramatically improved     Cancer of skin, squamous cell: Squamous cell of skin of chest, right breast and lower back.  Initially had been seeing a dermatologist, but since then had stopped following up.  Daughter was unaware lesions had grown in size.  Discussed with oncology who recommend outpatient follow-up with dermatology upon discharge.  Dermatology does not come into the hospital.   Stage IIIa chronic kidney disease: Looks to be at baseline    UTI (urinary tract infection): Urinalysis with moderate leukocytosis and CT of abdomen bilateral renal stones, but none obstruction without significant hydroureter.  Completed 3-day course of IV Rocephin.  Insignificant growth in culture.  Discussed with urology  Iron deficiency anemia: Recently found to be anemic and started on iron supplement.  Was supposed to have EGD and colonoscopy done on 12/17.  Hemoglobin down to 8, but since then has come up on its own.  Plan is to reschedule outpatient scope although may need inpatient consultation if hemoglobin has dropped.  Code Status: Partial code  Family Communication: Updated daughter by phone  Disposition Plan: Potential transfer to inpatient rehab if accepted   Consultants:  Neurosurgery  Urology  Procedures:  Planned meningioma resection 12/23  Antimicrobials:  IV Rocephin 12/16-12/19  DVT prophylaxis: SCDs   Objective: Vitals:   09/13/20 0740 09/13/20 1122  BP: (!) 144/65 (!) 151/68  Pulse: 74 74  Resp:  16  Temp:  97.8 F (36.6 C)  SpO2:  97%    Intake/Output Summary (Last 24 hours) at 09/13/2020 1332 Last data filed at 09/13/2020 0513 Gross per 24 hour  Intake 1196.25 ml  Output 360 ml  Net 836.25 ml   Filed Weights   09/07/20 0433  Weight: 63.9 kg   Body mass index is 25.77 kg/m.  Exam:   General: Alert and oriented x2, no acute  distress  HEENT: Status post right peroneal craniotomy, mucous membranes   Cardiovascular: Regular rate and rhythm, S1-S2  Lungs: Clear auscultation bilaterally  Abdomen: Soft, nontender, nondistended, hypoactive bowel sounds  Extremities: No clubbing or cyanosis, or edema  Neuro: Essential tremor  Psychiatry: Patient is appropriate, no evidence of psychoses   Data Reviewed: CBC: Recent Labs  Lab 09/07/20 0108 09/08/20 0240 09/09/20 0215 09/10/20 0246 09/11/20 1052 09/11/20 1224 09/11/20 1409 09/11/20 1503 09/12/20 0534  WBC 7.9 6.5 6.9  --   --   --   --   --  11.3*  NEUTROABS 6.9 5.5 5.8  --   --   --   --   --  9.9*  HGB 7.7* 8.0* 8.5*   < > 10.5* 9.9* 9.5* 8.8* 9.6*  HCT 27.0* 27.6* 28.1*   < > 31.0* 29.0* 28.0* 26.0* 31.4*  MCV 82.1 81.9 81.2  --   --   --   --   --  80.1  PLT 186 176 197  --   --   --   --   --  153   < > = values in this interval not displayed.   Basic Metabolic Panel: Recent Labs  Lab 09/07/20 0108 09/08/20 0240 09/09/20 0215 09/10/20 0246 09/11/20 0958 09/11/20 1052 09/11/20 1224 09/11/20 1409 09/11/20 1503 09/12/20 0534  NA 137 138 139 136   < > 135 136 138 139 139  K 3.9 4.3 4.3 4.4   < > 4.5 4.3 4.4 4.4 4.6  CL 107 106 107 105  --   --   --   --   --  112*  CO2 23 23 23 23   --   --   --   --   --  21*  GLUCOSE 148* 140* 136* 133*  --   --   --   --   --  123*  BUN 31* 32* 30* 30*  --   --   --   --   --  30*  CREATININE 1.29* 1.15* 1.08* 1.14*  --   --   --   --   --  1.15*  CALCIUM 8.2* 8.4* 8.6* 8.3*  --   --   --   --   --  7.0*  MG  --   --  1.9  --   --   --   --   --   --   --   PHOS  --   --  3.8  --   --   --   --   --   --   --    < > = values in this interval not displayed.   GFR: Estimated Creatinine Clearance: 40 mL/min (A) (by C-G formula based on SCr of 1.15 mg/dL (H)). Liver Function Tests: No results for input(s): AST, ALT, ALKPHOS, BILITOT, PROT, ALBUMIN in the last 168 hours. No results for input(s):  LIPASE, AMYLASE in the last 168 hours. No results for input(s): AMMONIA in the last 168 hours. Coagulation Profile: No results for input(s): INR, PROTIME in the last 168 hours. Cardiac Enzymes: No results for input(s): CKTOTAL, CKMB, CKMBINDEX, TROPONINI in the last 168 hours. BNP (last 3 results) No results for input(s): PROBNP in the last 8760 hours.  HbA1C: No results for input(s): HGBA1C in the last 72 hours. CBG: No results for input(s): GLUCAP in the last 168 hours. Lipid Profile: No results for input(s): CHOL, HDL, LDLCALC, TRIG, CHOLHDL, LDLDIRECT in the last 72 hours. Thyroid Function Tests: No results for input(s): TSH, T4TOTAL, FREET4, T3FREE, THYROIDAB in the last 72 hours. Anemia Panel: No results for input(s): VITAMINB12, FOLATE, FERRITIN, TIBC, IRON, RETICCTPCT in the last 72 hours. Urine analysis:    Component Value Date/Time   COLORURINE YELLOW 09/04/2020 1659   APPEARANCEUR HAZY (A) 09/04/2020 1659   LABSPEC 1.016 09/04/2020 1659   PHURINE 6.0 09/04/2020 1659   GLUCOSEU NEGATIVE 09/04/2020 1659   HGBUR LARGE (A) 09/04/2020 1659   BILIRUBINUR NEGATIVE 09/04/2020 1659   KETONESUR 20 (A) 09/04/2020 1659   PROTEINUR 100 (A) 09/04/2020 1659   UROBILINOGEN 0.2 07/04/2014 0406   NITRITE NEGATIVE 09/04/2020 1659   LEUKOCYTESUR MODERATE (A) 09/04/2020 1659   Sepsis Labs: @LABRCNTIP (procalcitonin:4,lacticidven:4)  ) Recent Results (from the past 240 hour(s))  Urine culture     Status: Abnormal   Collection Time: 09/04/20  5:24 PM   Specimen: Urine, Clean Catch  Result Value Ref Range Status   Specimen Description   Final    URINE, CLEAN CATCH Performed at [facility], [address] [place], [place] [address]    Special Requests   Final    NONE Performed at [facility], [address] [place], [place] [address]    Culture (A)  Final    <10,000 COLONIES/mL INSIGNIFICANT GROWTH Performed at [facility], [place] [address] [place], [place] [address]     Report Status 09/06/2020 FINAL  Final  Resp Panel by RT-PCR (Flu A&B, Covid) Nasopharyngeal Swab     Status: None   Collection Time: 09/04/20  6:47 PM   Specimen: Nasopharyngeal Swab; Nasopharyngeal(NP) swabs in vial transport medium  Result Value Ref Range Status   SARS Coronavirus 2 by RT PCR NEGATIVE NEGATIVE Final    Comment: (NOTE) SARS-CoV-2 target nucleic acids are NOT DETECTED.  The SARS-CoV-2 RNA is generally detectable in upper respiratory specimens during the acute phase of infection. The lowest concentration of SARS-CoV-2 viral copies this assay can detect is 138 copies/mL. A negative result does not preclude SARS-Cov-2 infection and should not be used as the sole basis for treatment or other patient management decisions. A negative result may occur with  improper specimen collection/handling, submission of specimen other than nasopharyngeal swab, presence of viral mutation(s) within the areas targeted by this assay, and inadequate number of viral copies(<138 copies/mL). A negative result must be combined with clinical observations, patient history, and epidemiological information. The expected result is Negative.  Fact Sheet for Patients:  EntrepreneurPulse.com.au  Fact Sheet for Healthcare Providers:  IncredibleEmployment.be  This test is no t yet approved or cleared by the [place] FDA and  has been authorized for detection and/or diagnosis of SARS-CoV-2 by FDA under an Emergency Use Authorization (EUA). This EUA will remain  in effect (meaning this test can be used) for the duration of the COVID-19 declaration under Section 564(b)(1) of the Act, 21 U.S.C.section 360bbb-3(b)(1), unless the authorization is terminated  or revoked sooner.       Influenza A by PCR NEGATIVE NEGATIVE Final   Influenza B by PCR NEGATIVE NEGATIVE Final    Comment: (NOTE) The Xpert Xpress SARS-CoV-2/FLU/RSV plus assay is intended as an aid in  the diagnosis of influenza from Nasopharyngeal swab specimens and should not be used as a  sole basis for treatment. Nasal washings and aspirates are unacceptable for Xpert Xpress SARS-CoV-2/FLU/RSV testing.  Fact Sheet for Patients: EntrepreneurPulse.com.au  Fact Sheet for Healthcare Providers: IncredibleEmployment.be  This test is not yet approved or cleared by the [place] FDA and has been authorized for detection and/or diagnosis of SARS-CoV-2 by FDA under an Emergency Use Authorization (EUA). This EUA will remain in effect (meaning this test can be used) for the duration of the COVID-19 declaration under Section 564(b)(1) of the Act, 21 U.S.C. section 360bbb-3(b)(1), unless the authorization is terminated or revoked.  Performed at [facility], [address], [place], [place] [address]   MRSA PCR Screening     Status: None   Collection Time: 09/10/20  5:18 PM   Specimen: Nasal Mucosa; Nasopharyngeal  Result Value Ref Range Status   MRSA by PCR NEGATIVE NEGATIVE Final    Comment:        The GeneXpert MRSA Assay (FDA approved for NASAL specimens only), is one component of a comprehensive MRSA colonization surveillance program. It is not intended to diagnose MRSA infection nor to guide or monitor treatment for MRSA infections. Performed at [facility], [place] [address] [place], [place] [address]       Studies: No results found.  Scheduled Meds: . Chlorhexidine Gluconate Cloth  6 each Topical Daily  . dexamethasone  4 mg Oral Q8H   Followed by  . [START ON 09/15/2020] dexamethasone  2 mg Oral Q8H   Followed by  . [START ON 09/18/2020] dexamethasone  2 mg Oral Q12H   Followed by  . [START ON 09/21/2020] dexamethasone  1 mg Oral Q12H   Followed by  . [START ON 09/24/2020] dexamethasone  1 mg Oral Daily  . docusate sodium  100 mg Oral BID  . enoxaparin (LOVENOX) injection  40 mg Subcutaneous Q24H  . ferrous sulfate  325  mg Oral Q breakfast  . levETIRAcetam  500 mg Oral BID  . pantoprazole  40 mg Oral Daily    Continuous Infusions: . sodium chloride 75 mL/hr at 09/13/20 0031  . niCARDipine Stopped (09/12/20 [id number])     LOS: 9 days     Annita Brod, MD [facility]   09/13/2020, 1:32 PM

## 2020-09-13 NOTE — Plan of Care (Signed)
  Problem: Education: Goal: Knowledge of General Education information will improve Description: Including pain rating scale, medication(s)/side effects and non-pharmacologic comfort measures Outcome: Progressing   Problem: Health Behavior/Discharge Planning: Goal: Ability to manage health-related needs will improve Outcome: Progressing   Problem: Clinical Measurements: Goal: Ability to maintain clinical measurements within normal limits will improve Outcome: Progressing Goal: Will remain free from infection Outcome: Progressing Goal: Diagnostic test results will improve Outcome: Progressing Goal: Respiratory complications will improve Outcome: Progressing Goal: Cardiovascular complication will be avoided Outcome: Progressing   Problem: Activity: Goal: Risk for activity intolerance will decrease Outcome: Progressing   Problem: Nutrition: Goal: Adequate nutrition will be maintained Outcome: Progressing   Problem: Coping: Goal: Level of anxiety will decrease Outcome: Progressing   Problem: Elimination: Goal: Will not experience complications related to bowel motility Outcome: Progressing Goal: Will not experience complications related to urinary retention Outcome: Progressing   Problem: Pain Managment: Goal: General experience of comfort will improve Outcome: Progressing   Problem: Safety: Goal: Ability to remain free from injury will improve Outcome: Progressing   Problem: Skin Integrity: Goal: Risk for impaired skin integrity will decrease Outcome: Progressing   Problem: Education: Goal: Knowledge of the prescribed therapeutic regimen will improve Outcome: Progressing   Problem: Activity: Goal: Ability to tolerate increased activity will improve Outcome: Progressing   Problem: Health Behavior/Discharge Planning: Goal: Identification of resources available to assist in meeting health care needs will improve Outcome: Progressing   Problem:  Nutrition: Goal: Maintenance of adequate nutrition will improve Outcome: Progressing   Problem: Clinical Measurements: Goal: Complications related to the disease process, condition or treatment will be avoided or minimized Outcome: Progressing   Problem: Respiratory: Goal: Will regain and/or maintain adequate ventilation Outcome: Progressing   Problem: Skin Integrity: Goal: Demonstration of wound healing without infection will improve Outcome: Progressing   

## 2020-09-14 LAB — BPAM RBC
Blood Product Expiration Date: 202201162359
Blood Product Expiration Date: 202201202359
Blood Product Expiration Date: 202201202359
Blood Product Expiration Date: 202201202359
Blood Product Expiration Date: 202201212359
Blood Product Expiration Date: 202201212359
ISSUE DATE / TIME: 202112230741
ISSUE DATE / TIME: 202112230741
ISSUE DATE / TIME: 202112230957
ISSUE DATE / TIME: 202112230957
Unit Type and Rh: 6200
Unit Type and Rh: 6200
Unit Type and Rh: 6200
Unit Type and Rh: 6200
Unit Type and Rh: 6200
Unit Type and Rh: 6200

## 2020-09-14 LAB — TYPE AND SCREEN
ABO/RH(D): A POS
Antibody Screen: NEGATIVE
Unit division: 0
Unit division: 0
Unit division: 0
Unit division: 0
Unit division: 0
Unit division: 0

## 2020-09-14 LAB — BASIC METABOLIC PANEL
Anion gap: 8 (ref 5–15)
BUN: 25 mg/dL — ABNORMAL HIGH (ref 8–23)
CO2: 20 mmol/L — ABNORMAL LOW (ref 22–32)
Calcium: 7.2 mg/dL — ABNORMAL LOW (ref 8.9–10.3)
Chloride: 109 mmol/L (ref 98–111)
Creatinine, Ser: 0.9 mg/dL (ref 0.44–1.00)
GFR, Estimated: >60 mL/min (ref >60)
Glucose, Bld: 142 mg/dL — ABNORMAL HIGH (ref 70–99)
Potassium: 4.4 mmol/L (ref 3.5–5.1)
Sodium: 137 mmol/L (ref 135–145)

## 2020-09-14 LAB — CBC
HCT: 28.6 % — ABNORMAL LOW (ref 36.0–46.0)
Hemoglobin: 8.6 g/dL — ABNORMAL LOW (ref 12.0–15.0)
MCH: 24 pg — ABNORMAL LOW (ref 26.0–34.0)
MCHC: 30.1 g/dL (ref 30.0–36.0)
MCV: 79.7 fL — ABNORMAL LOW (ref 80.0–100.0)
Platelets: 137 10*3/uL — ABNORMAL LOW (ref 150–400)
RBC: 3.59 MIL/uL — ABNORMAL LOW (ref 3.87–5.11)
RDW: 21.4 % — ABNORMAL HIGH (ref 11.5–15.5)
WBC: 8.8 10*3/uL (ref 4.0–10.5)
nRBC: 0 % (ref 0.0–0.2)

## 2020-09-14 MED ORDER — NAPHAZOLINE-GLYCERIN 0.012-0.2 % OP SOLN
2.0000 [drp] | Freq: Three times a day (TID) | OPHTHALMIC | Status: DC
  Administered 2020-09-14 – 2020-09-17 (×10): 2 [drp] via OPHTHALMIC
  Filled 2020-09-14: qty 15

## 2020-09-14 MED ORDER — POLYETHYLENE GLYCOL 3350 17 G PO PACK
17.0000 g | PACK | Freq: Once | ORAL | Status: AC
  Administered 2020-09-14: 11:00:00 17 g via ORAL
  Filled 2020-09-14: qty 1

## 2020-09-14 MED ORDER — BACITRACIN ZINC 500 UNIT/GM EX OINT
1.0000 "application " | TOPICAL_OINTMENT | Freq: Every day | CUTANEOUS | Status: DC
  Administered 2020-09-14 – 2020-09-17 (×4): 1 via TOPICAL
  Filled 2020-09-14: qty 28.4

## 2020-09-14 MED ORDER — ATORVASTATIN CALCIUM 10 MG PO TABS
20.0000 mg | ORAL_TABLET | Freq: Every day | ORAL | Status: DC
  Administered 2020-09-14 – 2020-09-17 (×4): 20 mg via ORAL
  Filled 2020-09-14 (×4): qty 2

## 2020-09-14 MED ORDER — HYDRALAZINE HCL 10 MG PO TABS
10.0000 mg | ORAL_TABLET | Freq: Three times a day (TID) | ORAL | Status: DC
  Administered 2020-09-14 – 2020-09-16 (×6): 10 mg via ORAL
  Filled 2020-09-14 (×6): qty 1

## 2020-09-14 MED ORDER — ESCITALOPRAM OXALATE 10 MG PO TABS
10.0000 mg | ORAL_TABLET | Freq: Every day | ORAL | Status: DC
  Administered 2020-09-14 – 2020-09-17 (×4): 10 mg via ORAL
  Filled 2020-09-14 (×4): qty 1

## 2020-09-14 MED ORDER — POLYVINYL ALCOHOL 1.4 % OP SOLN
1.0000 [drp] | Freq: Two times a day (BID) | OPHTHALMIC | Status: DC | PRN
  Administered 2020-09-14 – 2020-09-16 (×3): 1 [drp] via OPHTHALMIC
  Filled 2020-09-14: qty 15

## 2020-09-14 NOTE — Progress Notes (Signed)
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  She was recently started on iron supplements for anemia.  Apparently she was scheduled for EGD and colonoscopy on 09/05/20.  Her daughter reports she has been confused, forgetful and been complaining about headache recently, Patient reports intermittent headache for almost a year which has recently gotten worse.  Head CT showed anterior cranial fossa meningioma causing severe vasogenic edema within the right frontal lobe, leftward midline shift with subfalcine herniation of the right cingulate gyrus with early entrapment of the left lateral ventricle.  Neurosurgery was consulted by ED physician who recommended to initiate Decadron 10 mg daily and patient underwent meningioma removal on 12/23 without major complications.    She is also found to have a 3 mm stone without significant hydronephrosis,  Dr. Caprice Beaver, urologist was consulted,  recommended course of antibiotics which she completed.  Physical and occupational therapy recommending inpatient rehab.  Consult pending.  Patient doing well this morning.  Mild blood pressure increases overnight.  Had some right eye irritation this morning.  Assessment/Plan: Principal Problem:   Meningioma [facility]): Looks to be the cause of her intermittent headaches for the past year.  MRI and CT noted large meningioma causing mass-effect.  Started on IV dexamethasone and Keppra.  Status post resection of mass on 12/23 by neurosurgery.  Stable and now inpatient rehab consult pending.  Initial swallowing issues post surgery have resolved.  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  Looks to be resolved.     Cancer of skin, squamous cell: Squamous cell of skin of chest, right breast and lower back.  Initially had been seeing a dermatologist, but since then had stopped following up.  Daughter was unaware lesions had grown in size.  Discussed with oncology who recommend outpatient follow-up with dermatology upon discharge.  Dermatology does not come into the hospital.   Stage IIIa chronic kidney disease: Looks to be at baseline    UTI (urinary tract infection): Urinalysis with moderate leukocytosis and CT of abdomen bilateral renal stones, but none obstruction without significant hydroureter.  Completed 3-day course of IV Rocephin.  Insignificant growth in culture.  Discussed with urology  Iron deficiency anemia: Recently found to be anemic and started on iron supplement.  Was supposed to have EGD and colonoscopy done on 12/17.  Hemoglobin down to 8, but since then has come up on its own.  Plan is to reschedule outpatient scope although may need inpatient consultation if hemoglobin has dropped.  Right eye irritation: Some erythema noted.  We will try drops  Code Status: Partial code  Family Communication: Updated daughter by phone  Disposition Plan: Potential transfer to inpatient rehab if accepted   Consultants:  Neurosurgery  Urology  Procedures:  Planned meningioma resection 12/23  Antimicrobials:  IV Rocephin 12/16-12/19  DVT prophylaxis: SCDs   Objective: Vitals:   09/14/20 1223 09/14/20 1341  BP: (!) 147/72 135/70  Pulse: 74   Resp: 17   Temp: 98 F (36.7 C)   SpO2: 97%  Intake/Output Summary (Last 24 hours) at 09/14/2020 1356 Last data filed at 09/14/2020 0852 Gross per 24 hour  Intake 1161.75 ml  Output 350 ml  Net 811.75 ml   Filed Weights    09/07/20 0433  Weight: 63.9 kg   Body mass index is 25.77 kg/m.  Exam:   General: Alert and oriented x2, no acute distress  HEENT: Status post right peroneal craniotomy-incision clean dry intact, mucous membranes moist.  Right eye-sclera looks to be injected  Cardiovascular: Regular rate and rhythm, S1-S2  Lungs: Clear auscultation bilaterally  Abdomen: Soft, nontender, nondistended, hypoactive bowel sounds  Extremities: No clubbing or cyanosis, or edema  Neuro: Essential tremor  Psychiatry: Patient is appropriate, no evidence of psychoses   Data Reviewed: CBC: Recent Labs  Lab 09/08/20 0240 09/09/20 0215 09/10/20 0246 09/11/20 1224 09/11/20 1409 09/11/20 1503 09/12/20 0534 09/14/20 0242  WBC 6.5 6.9  --   --   --   --  11.3* 8.8  NEUTROABS 5.5 5.8  --   --   --   --  9.9*  --   HGB 8.0* 8.5*   < > 9.9* 9.5* 8.8* 9.6* 8.6*  HCT 27.6* 28.1*   < > 29.0* 28.0* 26.0* 31.4* 28.6*  MCV 81.9 81.2  --   --   --   --  80.1 79.7*  PLT 176 197  --   --   --   --  153 137*   < > = values in this interval not displayed.   Basic Metabolic Panel: Recent Labs  Lab 09/08/20 0240 09/09/20 0215 09/10/20 0246 09/11/20 0958 09/11/20 1224 09/11/20 1409 09/11/20 1503 09/12/20 0534 09/14/20 0242  NA 138 139 136   < > 136 138 139 139 137  K 4.3 4.3 4.4   < > 4.3 4.4 4.4 4.6 4.4  CL 106 107 105  --   --   --   --  112* 109  CO2 23 23 23   --   --   --   --  21* 20*  GLUCOSE 140* 136* 133*  --   --   --   --  123* 142*  BUN 32* 30* 30*  --   --   --   --  30* 25*  CREATININE 1.15* 1.08* 1.14*  --   --   --   --  1.15* 0.90  CALCIUM 8.4* 8.6* 8.3*  --   --   --   --  7.0* 7.2*  MG  --  1.9  --   --   --   --   --   --   --   PHOS  --  3.8  --   --   --   --   --   --   --    < > = values in this interval not displayed.   GFR: Estimated Creatinine Clearance: 51.1 mL/min (by C-G formula based on SCr of 0.9 mg/dL). Liver Function Tests: No results for input(s): AST, ALT,  ALKPHOS, BILITOT, PROT, ALBUMIN in the last 168 hours. No results for input(s): LIPASE, AMYLASE in the last 168 hours. No results for input(s): AMMONIA in the last 168 hours. Coagulation Profile: No results for input(s): INR, PROTIME in the last 168 hours. Cardiac Enzymes: No results for input(s): CKTOTAL, CKMB, CKMBINDEX, TROPONINI in the last 168 hours. BNP (last 3 results) No results for input(s): PROBNP in the last 8760 hours. HbA1C: No results for input(s): HGBA1C in  the last 72 hours. CBG: No results for input(s): GLUCAP in the last 168 hours. Lipid Profile: No results for input(s): CHOL, HDL, LDLCALC, TRIG, CHOLHDL, LDLDIRECT in the last 72 hours. Thyroid Function Tests: No results for input(s): TSH, T4TOTAL, FREET4, T3FREE, THYROIDAB in the last 72 hours. Anemia Panel: No results for input(s): VITAMINB12, FOLATE, FERRITIN, TIBC, IRON, RETICCTPCT in the last 72 hours. Urine analysis:    Component Value Date/Time   COLORURINE YELLOW 09/04/2020 1659   APPEARANCEUR HAZY (A) 09/04/2020 1659   LABSPEC 1.016 09/04/2020 1659   PHURINE 6.0 09/04/2020 1659   GLUCOSEU NEGATIVE 09/04/2020 1659   HGBUR LARGE (A) 09/04/2020 1659   BILIRUBINUR NEGATIVE 09/04/2020 1659   KETONESUR 20 (A) 09/04/2020 1659   PROTEINUR 100 (A) 09/04/2020 1659   UROBILINOGEN 0.2 07/04/2014 0406   NITRITE NEGATIVE 09/04/2020 1659   LEUKOCYTESUR MODERATE (A) 09/04/2020 1659   Sepsis Labs: @LABRCNTIP (procalcitonin:4,lacticidven:4)  ) Recent Results (from the past 240 hour(s))  Urine culture     Status: Abnormal   Collection Time: 09/04/20  5:24 PM   Specimen: Urine, Clean Catch  Result Value Ref Range Status   Specimen Description   Final    URINE, CLEAN CATCH Performed at [facility], [address], [place], [place] [address]    Special Requests   Final    NONE Performed at [facility], [address] [place], [place] [address]    Culture (A)  Final    <10,000 COLONIES/mL INSIGNIFICANT  GROWTH Performed at [facility], [place] [address], [place],  [address]    Report Status 09/06/2020 FINAL  Final  Resp Panel by RT-PCR (Flu A&B, Covid) Nasopharyngeal Swab     Status: None   Collection Time: 09/04/20  6:47 PM   Specimen: Nasopharyngeal Swab; Nasopharyngeal(NP) swabs in vial transport medium  Result Value Ref Range Status   SARS Coronavirus 2 by RT PCR NEGATIVE NEGATIVE Final    Comment: (NOTE) SARS-CoV-2 target nucleic acids are NOT DETECTED.  The SARS-CoV-2 RNA is generally detectable in upper respiratory specimens during the acute phase of infection. The lowest concentration of SARS-CoV-2 viral copies this assay can detect is 138 copies/mL. A negative result does not preclude SARS-Cov-2 infection and should not be used as the sole basis for treatment or other patient management decisions. A negative result may occur with  improper specimen collection/handling, submission of specimen other than nasopharyngeal swab, presence of viral mutation(s) within the areas targeted by this assay, and inadequate number of viral copies(<138 copies/mL). A negative result must be combined with clinical observations, patient history, and epidemiological information. The expected result is Negative.  Fact Sheet for Patients:  EntrepreneurPulse.com.au  Fact Sheet for Healthcare Providers:  IncredibleEmployment.be  This test is no t yet approved or cleared by the [place] FDA and  has been authorized for detection and/or diagnosis of SARS-CoV-2 by FDA under an Emergency Use Authorization (EUA). This EUA will remain  in effect (meaning this test can be used) for the duration of the COVID-19 declaration under Section 564(b)(1) of the Act, 21 U.S.C.section 360bbb-3(b)(1), unless the authorization is terminated  or revoked sooner.       Influenza A by PCR NEGATIVE NEGATIVE Final   Influenza B by PCR NEGATIVE NEGATIVE Final     Comment: (NOTE) The Xpert Xpress SARS-CoV-2/FLU/RSV plus assay is intended as an aid in the diagnosis of influenza from Nasopharyngeal swab specimens and should not be used as a sole basis for treatment. Nasal washings and  aspirates are unacceptable for Xpert Xpress SARS-CoV-2/FLU/RSV testing.  Fact Sheet for Patients: EntrepreneurPulse.com.au  Fact Sheet for Healthcare Providers: IncredibleEmployment.be  This test is not yet approved or cleared by the [place] FDA and has been authorized for detection and/or diagnosis of SARS-CoV-2 by FDA under an Emergency Use Authorization (EUA). This EUA will remain in effect (meaning this test can be used) for the duration of the COVID-19 declaration under Section 564(b)(1) of the Act, 21 U.S.C. section 360bbb-3(b)(1), unless the authorization is terminated or revoked.  Performed at [facility], [address] [place], [place] [address]   MRSA PCR Screening     Status: None   Collection Time: 09/10/20  5:18 PM   Specimen: Nasal Mucosa; Nasopharyngeal  Result Value Ref Range Status   MRSA by PCR NEGATIVE NEGATIVE Final    Comment:        The GeneXpert MRSA Assay (FDA approved for NASAL specimens only), is one component of a comprehensive MRSA colonization surveillance program. It is not intended to diagnose MRSA infection nor to guide or monitor treatment for MRSA infections. Performed at [facility], [place] [address], [place], [place] [address]       Studies: No results found.  Scheduled Meds: . atorvastatin  20 mg Oral Daily  . bacitracin  1 application Topical Daily  . Chlorhexidine Gluconate Cloth  6 each Topical Daily  . dexamethasone  4 mg Oral Q8H   Followed by  . [START ON 09/15/2020] dexamethasone  2 mg Oral Q8H   Followed by  . [START ON 09/18/2020] dexamethasone  2 mg Oral Q12H   Followed by  . [START ON 09/21/2020] dexamethasone  1 mg Oral Q12H   Followed by  .  [START ON 09/24/2020] dexamethasone  1 mg Oral Daily  . docusate sodium  100 mg Oral BID  . enoxaparin (LOVENOX) injection  40 mg Subcutaneous Q24H  . escitalopram  10 mg Oral Daily  . ferrous sulfate  325 mg Oral Q breakfast  . hydrALAZINE  10 mg Oral Q8H  . levETIRAcetam  500 mg Oral BID  . naphazoline-glycerin  2 drop Right Eye TID  . pantoprazole  40 mg Oral Daily    Continuous Infusions: . sodium chloride 75 mL/hr at 09/14/20 0348  . niCARDipine Stopped (09/12/20 [id number])     LOS: 10 days     Annita Brod, MD [facility]   09/14/2020, 1:56 PM

## 2020-09-14 NOTE — Progress Notes (Signed)
Subjective: Patient reports some right eye welling.  Objective: Vital signs in last 24 hours: Temp:  [97.7 F (36.5 C)-98.5 F (36.9 C)] 97.7 F (36.5 C) (12/26 0852) Pulse Rate:  [69-89] 71 (12/26 0852) Resp:  [15-18] 15 (12/26 0852) BP: (130-177)/(58-75) 134/65 (12/26 0852) SpO2:  [95 %-97 %] 97 % (12/26 0852)  Intake/Output from previous day: 12/25 0701 - 12/26 0700 In: 1043.8 [I.V.:1043.8] Out: 350 [Urine:350] Intake/Output this shift: Total I/O In: 118 [P.O.:118] Out: -   Awake, alert, Ox3 R periorbital swelling, chemosis present VFC FC x 4, no drift Incision c/d   Lab Results: Recent Labs    09/12/20 0534 09/14/20 0242  WBC 11.3* 8.8  HGB 9.6* 8.6*  HCT 31.4* 28.6*  PLT 153 137*   BMET Recent Labs    09/12/20 0534 09/14/20 0242  NA 139 137  K 4.6 4.4  CL 112* 109  CO2 21* 20*  GLUCOSE 123* 142*  BUN 30* 25*  CREATININE 1.15* 0.90  CALCIUM 7.0* 7.2*    Studies/Results: No results found.  Assessment/Plan: S/p resection of anterior skull base tumor, doing well - likely CIR tomorrow - bacitracin ointment to scalp insicion daily - xeroform dressings to fungating skin masses  [name] Page 09/14/2020, 11:04 AM

## 2020-09-14 NOTE — Plan of Care (Signed)
  Problem: Education: Goal: Knowledge of General Education information will improve Description: Including pain rating scale, medication(s)/side effects and non-pharmacologic comfort measures Outcome: Progressing   Problem: Health Behavior/Discharge Planning: Goal: Ability to manage health-related needs will improve Outcome: Progressing   Problem: Clinical Measurements: Goal: Ability to maintain clinical measurements within normal limits will improve Outcome: Progressing Goal: Will remain free from infection Outcome: Progressing Goal: Diagnostic test results will improve Outcome: Progressing Goal: Respiratory complications will improve Outcome: Progressing Goal: Cardiovascular complication will be avoided Outcome: Progressing   Problem: Activity: Goal: Risk for activity intolerance will decrease Outcome: Progressing   Problem: Nutrition: Goal: Adequate nutrition will be maintained Outcome: Progressing   Problem: Coping: Goal: Level of anxiety will decrease Outcome: Progressing   Problem: Elimination: Goal: Will not experience complications related to bowel motility Outcome: Progressing Goal: Will not experience complications related to urinary retention Outcome: Progressing   Problem: Pain Managment: Goal: General experience of comfort will improve Outcome: Progressing   Problem: Safety: Goal: Ability to remain free from injury will improve Outcome: Progressing   Problem: Skin Integrity: Goal: Risk for impaired skin integrity will decrease Outcome: Progressing   Problem: Education: Goal: Knowledge of the prescribed therapeutic regimen will improve Outcome: Progressing   Problem: Activity: Goal: Ability to tolerate increased activity will improve Outcome: Progressing   Problem: Health Behavior/Discharge Planning: Goal: Identification of resources available to assist in meeting health care needs will improve Outcome: Progressing   Problem:  Nutrition: Goal: Maintenance of adequate nutrition will improve Outcome: Progressing   Problem: Clinical Measurements: Goal: Complications related to the disease process, condition or treatment will be avoided or minimized Outcome: Progressing   Problem: Respiratory: Goal: Will regain and/or maintain adequate ventilation Outcome: Progressing   Problem: Skin Integrity: Goal: Demonstration of wound healing without infection will improve Outcome: Progressing   

## 2020-09-15 ENCOUNTER — Other Ambulatory Visit: Payer: Self-pay | Admitting: Family Medicine

## 2020-09-15 ENCOUNTER — Encounter (HOSPITAL_COMMUNITY): Payer: Self-pay | Admitting: Neurosurgery

## 2020-09-15 DIAGNOSIS — N1831 Chronic kidney disease, stage 3a: Secondary | ICD-10-CM

## 2020-09-15 DIAGNOSIS — E782 Mixed hyperlipidemia: Secondary | ICD-10-CM

## 2020-09-15 MED ORDER — DOCUSATE SODIUM 50 MG/5ML PO LIQD
100.0000 mg | Freq: Two times a day (BID) | ORAL | Status: DC
  Administered 2020-09-15 – 2020-09-16 (×2): 100 mg via ORAL
  Filled 2020-09-15 (×3): qty 10

## 2020-09-15 MED ORDER — HYDRALAZINE HCL 20 MG/ML IJ SOLN
5.0000 mg | Freq: Once | INTRAMUSCULAR | Status: AC
  Administered 2020-09-15: 01:00:00 5 mg via INTRAVENOUS
  Filled 2020-09-15: qty 1

## 2020-09-15 NOTE — Progress Notes (Signed)
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  She was recently started on iron supplements for anemia.  Apparently she was scheduled for EGD and colonoscopy on 09/05/20.  Her daughter reports she has been confused, forgetful and been complaining about headache recently, Patient reports intermittent headache for almost a year which has recently gotten worse.  Head CT showed anterior cranial fossa meningioma causing severe vasogenic edema within the right frontal lobe, leftward midline shift with subfalcine herniation of the right cingulate gyrus with early entrapment of the left lateral ventricle.  Neurosurgery was consulted by ED physician who recommended to initiate Decadron 10 mg daily and patient underwent meningioma removal on 12/23 without major complications.    She is also found to have a 3 mm stone without significant hydronephrosis,  Dr. Caprice Beaver, urologist was consulted,  recommended course of antibiotics which she completed.    Patient doing okay, about the same.  No complaints.  Seen by physical medicine and rehab and felt to be appropriate candidate.  Waiting for insurance authorization.  Assessment/Plan: Principal Problem:   Meningioma [facility]): Looks to be the cause of her intermittent headaches for the past year.  MRI and CT noted large meningioma causing mass-effect.  Started on IV dexamethasone and Keppra.  Status post resection of mass on 12/23 by neurosurgery.  Stable and now inpatient rehab consult pending.  Initial swallowing issues post surgery have resolved.  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  Looks to be resolved.     Cancer of skin, squamous cell: Squamous cell of skin of chest, right breast and lower back.  Initially had been seeing a dermatologist, but since then had stopped following up.  Daughter was unaware lesions had grown in size.  Discussed with oncology who recommend outpatient follow-up with dermatology upon discharge.  Dermatology does not come into the hospital.   Stage IIIa chronic kidney disease: Looks to be at baseline    UTI (urinary tract infection): Urinalysis with moderate leukocytosis and CT of abdomen bilateral renal stones, but none obstruction without significant hydroureter.  Completed 3-day course of IV Rocephin.  Insignificant growth in culture.  Discussed with urology  Iron deficiency anemia: Recently found to be anemic and started on iron supplement.  Was supposed to have EGD and colonoscopy done on 12/17.  Hemoglobin down to 8, but since then has come up on its own.  Plan is to reschedule outpatient scope although may need inpatient consultation if hemoglobin has dropped.  We will continue to follow  Right eye irritation: Some erythema noted.  Mild improvement with eyedrops  Code Status: Partial code  Family Communication: Updated daughter by phone  Disposition Plan: Inpatient rehab, awaiting for insurance authorization   Consultants:  Neurosurgery  Urology  Procedures:  meningioma resection 12/23  Antimicrobials:  IV Rocephin 12/16-12/19  DVT prophylaxis: SCDs   Objective: Vitals:   09/15/20 0954 09/15/20 1153  BP: (!) 125/58 (!) 145/67  Pulse: 67 67  Resp:  18  Temp:  98.2 F (36.8 C)  SpO2:  98%    Intake/Output Summary (Last 24 hours) at 09/15/2020 1434 Last data filed at 09/15/2020 1434 Gross per 24 hour  Intake 1584.9 ml  Output 1150 ml  Net 434.9 ml    Filed Weights   09/07/20 0433  Weight: 63.9 kg   Body mass index is 25.77 kg/m.  Exam:   General: Alert and oriented x2, no acute distress  HEENT: Status post right peroneal craniotomy-incision clean dry intact, mucous membranes moist.  Right eye-sclera erythema improved  Cardiovascular: Regular rate and rhythm, S1-S2  Lungs: Clear auscultation bilaterally  Abdomen: Soft, nontender, nondistended, hypoactive bowel sounds  Extremities: No clubbing or cyanosis, or edema  Neuro: Essential tremor  Psychiatry: Patient is appropriate, no evidence of psychoses   Data Reviewed: CBC: Recent Labs  Lab 09/09/20 0215 09/10/20 0246 09/11/20 1224 09/11/20 1409 09/11/20 1503 09/12/20 0534 09/14/20 0242  WBC 6.9  --   --   --   --  11.3* 8.8  NEUTROABS 5.8  --   --   --   --  9.9*  --   HGB 8.5*   < > 9.9* 9.5* 8.8* 9.6* 8.6*  HCT 28.1*   < > 29.0* 28.0* 26.0* 31.4* 28.6*  MCV 81.2  --   --   --   --  80.1 79.7*  PLT 197  --   --   --   --  153 137*   < > = values in this interval not displayed.   Basic Metabolic Panel: Recent Labs  Lab 09/09/20 0215 09/10/20 0246 09/11/20 0958 09/11/20 1224 09/11/20 1409 09/11/20 1503 09/12/20 0534 09/14/20 0242  NA 139 136   < > 136 138 139 139 137  K 4.3 4.4   < > 4.3 4.4 4.4 4.6 4.4  CL 107 105  --   --   --   --  112* 109  CO2 23 23  --   --   --   --  21* 20*  GLUCOSE 136* 133*  --   --   --   --  123* 142*  BUN 30* 30*  --   --   --   --  30* 25*  CREATININE 1.08* 1.14*  --   --   --   --  1.15* 0.90  CALCIUM 8.6* 8.3*  --   --   --   --  7.0* 7.2*  MG 1.9  --   --   --   --   --   --   --   PHOS 3.8  --   --   --   --   --   --   --    < > = values in this interval not displayed.   GFR: Estimated Creatinine Clearance: 51.1 mL/min (by C-G formula based on SCr of 0.9 mg/dL). Liver Function Tests: No results for input(s): AST, ALT, ALKPHOS, BILITOT, PROT, ALBUMIN in the last 168 hours. No results for input(s): LIPASE,  AMYLASE in the last 168 hours. No results for input(s): AMMONIA in the last 168 hours. Coagulation Profile: No results for input(s): INR, PROTIME in the last 168 hours. Cardiac Enzymes: No results for input(s): CKTOTAL, CKMB, CKMBINDEX, TROPONINI in the last 168 hours. BNP (last 3 results) No results for input(s): PROBNP in the last 8760 hours. HbA1C: No results for input(s): HGBA1C in the last 72 hours. CBG: No results for input(s): GLUCAP in the last 168 hours. Lipid Profile: No results for input(s): CHOL,  HDL, LDLCALC, TRIG, CHOLHDL, LDLDIRECT in the last 72 hours. Thyroid Function Tests: No results for input(s): TSH, T4TOTAL, FREET4, T3FREE, THYROIDAB in the last 72 hours. Anemia Panel: No results for input(s): VITAMINB12, FOLATE, FERRITIN, TIBC, IRON, RETICCTPCT in the last 72 hours. Urine analysis:    Component Value Date/Time   COLORURINE YELLOW 09/04/2020 1659   APPEARANCEUR HAZY (A) 09/04/2020 1659   LABSPEC 1.016 09/04/2020 1659   PHURINE 6.0 09/04/2020 1659   GLUCOSEU NEGATIVE 09/04/2020 1659   HGBUR LARGE (A) 09/04/2020 1659   BILIRUBINUR NEGATIVE 09/04/2020 1659   KETONESUR 20 (A) 09/04/2020 1659   PROTEINUR 100 (A) 09/04/2020 1659   UROBILINOGEN 0.2 07/04/2014 0406   NITRITE NEGATIVE 09/04/2020 1659   LEUKOCYTESUR MODERATE (A) 09/04/2020 1659   Sepsis Labs: @LABRCNTIP (procalcitonin:4,lacticidven:4)  ) Recent Results (from the past 240 hour(s))  MRSA PCR Screening     Status: None   Collection Time: 09/10/20  5:18 PM   Specimen: Nasal Mucosa; Nasopharyngeal  Result Value Ref Range Status   MRSA by PCR NEGATIVE NEGATIVE Final    Comment:        The GeneXpert MRSA Assay (FDA approved for NASAL specimens only), is one component of a comprehensive MRSA colonization surveillance program. It is not intended to diagnose MRSA infection nor to guide or monitor treatment for MRSA infections. Performed at [facility], [address] [place], [place]  [place]       Studies: No results found.  Scheduled Meds: . atorvastatin  20 mg Oral Daily  . bacitracin  1 application Topical Daily  . Chlorhexidine Gluconate Cloth  6 each Topical Daily  . dexamethasone  2 mg Oral Q8H   Followed by  . [START ON 09/18/2020] dexamethasone  2 mg Oral Q12H   Followed by  . [START ON 09/21/2020] dexamethasone  1 mg Oral Q12H   Followed by  . [START ON 09/24/2020] dexamethasone  1 mg Oral Daily  . docusate sodium  100 mg Oral BID  . enoxaparin (LOVENOX) injection  40 mg Subcutaneous Q24H  . escitalopram  10 mg Oral Daily  . ferrous sulfate  325 mg Oral Q breakfast  . hydrALAZINE  10 mg Oral Q8H  . levETIRAcetam  500 mg Oral BID  . naphazoline-glycerin  2 drop Right Eye TID  . pantoprazole  40 mg Oral Daily    Continuous Infusions: . sodium chloride 75 mL/hr at 09/15/20 1434  . niCARDipine Stopped (09/12/20 09/14/20)     LOS: 11 days     0350, MD [facility]   09/15/2020, 2:34 PM

## 2020-09-15 NOTE — Progress Notes (Signed)
Subjective: Patient reports no headache, R eye swelling decreased  Objective: Vital signs in last 24 hours: Temp:  [97.7 F (36.5 C)-98.2 F (36.8 C)] 97.7 F (36.5 C) (12/27 0326) Pulse Rate:  [69-79] 76 (12/27 0326) Resp:  [14-17] 14 (12/27 0326) BP: (131-162)/(62-78) 140/71 (12/27 0326) SpO2:  [95 %-97 %] 96 % (12/27 0326)  Intake/Output from previous day: 12/26 0701 - 12/27 0700 In: 118 [P.O.:118] Out: 750 [Urine:750] Intake/Output this shift: No intake/output data recorded.  Alert, Ox3.  Appears cognitively sharper than preop No drift Incision well-healed  Lab Results: Recent Labs    09/12/20 0534 09/14/20 0242  WBC 11.3* 8.8  HGB 9.6* 8.6*  HCT 31.4* 28.6*  PLT 153 137*   BMET Recent Labs    09/12/20 0534 09/14/20 0242  NA 139 137  K 4.6 4.4  CL 112* 109  CO2 21* 20*  GLUCOSE 123* 142*  BUN 30* 25*  CREATININE 1.15* 0.90  CALCIUM 7.0* 7.2*    Studies/Results: No results found.  Assessment/Plan: S/p rsxn of anterior skull base mass - likely to rehab today - staple removal in 7-10 days - dex taper - cont Keppra - will need f/u for her fungating necrotic skin masses   [name] Page 09/15/2020, 5:24 AM

## 2020-09-15 NOTE — PMR Pre-admission (Addendum)
PMR Admission Coordinator Pre-Admission Assessment  Patient: [name] Page is an 69 y.o., female MRN: [id number] DOB: [birth year] Height: 5\' 2"  (157.5 cm) Weight: 140 lb 14 oz (63.9 kg)              Insurance Information HMO:     PPO:      PCP:      IPA:      80/20:      OTHER:  PRIMARY: Medicare A and B      Policy#: [id number]      Subscriber: pt CM Name:       Phone#:      Fax#:  Pre-Cert#: verified Civil engineer, contracting:  Benefits:  Phone #:      Name:  Eff. Date: 01/19/15 A and B     Deduct: $1484 (2021) $1556 (2022)      Out of Pocket Max: n/a      Life Max: n/a  CIR: 100%      SNF: 20 full days Outpatient: 80%     Co-Pay: 20% Home Health: 100%      Co-Pay:  DME: 80%     Co-Pay: 20% Providers:  SECONDARY: BCBS [place]      Policy#: [id number]      Phone#: [phone]  Financial Counselor:       Phone#:   The "Data Collection Information Summary" for patients in Inpatient Rehabilitation Facilities with attached "Privacy Act [place] Records" was provided and verbally reviewed with: Patient and Family  Emergency Contact Information Contact Information     Name Relation Home Work Mobile   [place] Daughter [phone]   Jolene Schimke [id number]        Current Medical History  Patient Admitting Diagnosis: s/p meningioma resection   History of Present Illness: [name] Page is a 69 y.o. 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  Per chart review patient lives alone.  Mobile home with multiple steps.  She has been many family members that live close by to check on her routinely.  Reportedly independent prior to admission.  Presented 09/04/2020 with progressive weakness particularly involving bilateral lower extremities/headache x2 months as well as family reporting episodes of forgetfulness and confusion.  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  There was a leftward midline shift with subfalcine herniation of the right cingulate gyrus with early entrapment of the left lateral ventricle.  CT renal stone study completed for some hematuria that showed mild right renal pelvis dilatation without significant hydroureter secondary to 3 mm stone in the mid to distal right ureter at the level of the upper sacrum.  Patient underwent right pterional craniotomy for resection of anterior skull base mass 09/11/2020 per Dr. Duffy Rhody.  Decadron protocol as indicated.  Keppra for seizure prophylaxis.  Subcutaneous Lovenox for DVT prophylaxis initiated 09/12/2020.  Currently on mechanical soft diet.  Physical Medicine & Rehabilitation was consulted to assess candidacy for CIR for impaired mobility and ADLs.    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  Prior Rehab/Hospitalizations:  Has the patient had prior rehab or hospitalizations prior to admission? Yes  Has the patient had major surgery during 100 days prior to admission? Yes  Current Medications   Current Facility-Administered Medications:    0.9 %  sodium chloride infusion, , Intravenous, Continuous, Annita Brod, MD, Last Rate: 75 mL/hr at 09/16/20 1948,  New Bag at 09/16/20 1948   acetaminophen (TYLENOL) tablet 650 mg, 650 mg, Oral, Q6H PRN, 650 mg at 09/11/20 1755 **OR** acetaminophen (TYLENOL) suppository 650 mg, 650 mg, Rectal, Q6H PRN, Vallarie Mare, MD   atorvastatin (LIPITOR) tablet 20 mg, 20 mg, Oral, Daily, Annita Brod, MD, 20 mg at 09/17/20 1100   bacitracin ointment 1 application, 1 application, Topical, Daily, Vallarie Mare, MD, 1 application at 0000000 1100   Chlorhexidine Gluconate Cloth 2 % PADS 6 each, 6 each, Topical, Daily, Annita Brod, MD, 6 each at 09/17/20 1103   [COMPLETED] dexamethasone (DECADRON) tablet 4 mg, 4 mg, Oral, Q8H, 4 mg at 09/15/20 0526 **FOLLOWED BY** dexamethasone (DECADRON) tablet 2 mg, 2 mg, Oral, Q8H, 2 mg at 09/17/20 0631 **FOLLOWED BY** [START ON 09/18/2020] dexamethasone (DECADRON) tablet 2 mg, 2 mg, Oral, Q12H **FOLLOWED BY** [START ON 09/21/2020] dexamethasone (DECADRON) tablet 1 mg, 1 mg, Oral, Q12H **FOLLOWED BY** [START ON 09/24/2020] dexamethasone (DECADRON) tablet 1 mg, 1 mg, Oral, Daily, Vallarie Mare, MD   docusate (COLACE) 50 MG/5ML liquid 100 mg, 100 mg, Oral, BID, Gevena Barre K, MD, 100 mg at 09/16/20 1000   enalaprilat (VASOTEC) injection 1.25 mg, 1.25 mg, Intravenous, Q6H PRN, Vallarie Mare, MD, 1.25 mg at 09/16/20 1031   enoxaparin (LOVENOX) injection 40 mg, 40 mg, Subcutaneous, Q24H, Vallarie Mare, MD, 40 mg at 09/16/20 1406   escitalopram (LEXAPRO) tablet 10 mg, 10 mg, Oral, Daily, Gevena Barre K, MD, 10 mg at 09/17/20 1100   ferrous sulfate tablet 325 mg, 325 mg, Oral, Q breakfast, Vallarie Mare, MD, 325 mg at 09/17/20 0902   hydrALAZINE (APRESOLINE) injection 10 mg, 10 mg, Intravenous, Q6H PRN, Vallarie Mare, MD, 10 mg at 09/13/20 2136   hydrALAZINE (APRESOLINE) tablet 25 mg, 25 mg, Oral, Q8H, Annita Brod, MD, 25 mg at 09/17/20 0630   HYDROcodone-acetaminophen (NORCO/VICODIN) 5-325 MG per tablet 1 tablet, 1 tablet, Oral, Q4H PRN,  Vallarie Mare, MD, 1 tablet at 09/11/20 1953   labetalol (NORMODYNE) injection 10 mg, 10 mg, Intravenous, Q10 min PRN, Vallarie Mare, MD, 10 mg at 09/16/20 0933   levETIRAcetam (KEPPRA) tablet 500 mg, 500 mg, Oral, BID, Vallarie Mare, MD, 500 mg at 09/17/20 1100   morphine 2 MG/ML injection 1-2 mg, 1-2 mg, Intravenous, Q2H PRN, Vallarie Mare, MD   naphazoline-glycerin (CLEAR EYES REDNESS) ophth solution 2 drop, 2 drop, Right Eye, TID, Annita Brod, MD, 2 drop at 09/17/20 0903   ondansetron (ZOFRAN) tablet 4 mg, 4 mg, Oral, Q6H PRN **OR** ondansetron (ZOFRAN) injection 4 mg, 4 mg, Intravenous, Q6H  PRN, Bedelia Person, MD   ondansetron [facility]) tablet 4 mg, 4 mg, Oral, Q4H PRN **OR** ondansetron (ZOFRAN) injection 4 mg, 4 mg, Intravenous, Q4H PRN, Bedelia Person, MD   pantoprazole (PROTONIX) EC tablet 40 mg, 40 mg, Oral, Daily, Bedelia Person, MD, 40 mg at 09/17/20 1100   polyethylene glycol (MIRALAX / GLYCOLAX) packet 17 g, 17 g, Oral, Daily PRN, Bedelia Person, MD   polyvinyl alcohol (LIQUIFILM TEARS) 1.4 % ophthalmic solution 1 drop, 1 drop, Both Eyes, BID PRN, Hollice Espy, MD, 1 drop at 09/16/20 0859   promethazine (PHENERGAN) tablet 12.5-25 mg, 12.5-25 mg, Oral, Q4H PRN, Bedelia Person, MD   sodium phosphate (FLEET) 7-19 GM/118ML enema 1 enema, 1 enema, Rectal, Once PRN, Bedelia Person, MD  Patients Current Diet:  Diet Order             DIET SOFT Room service appropriate? Yes with Assist; Fluid consistency: Thin  Diet effective now                   Precautions / Restrictions Precautions Precautions: Fall Restrictions Weight Bearing Restrictions: No   Has the patient had 2 or more falls or a fall with injury in the past year?Yes  Prior Activity Level Community (5-7x/wk): no DME prior to admit, driving, some falls prior to admission  Prior Functional Level Prior Function Level of Independence: Independent Comments:  drove and completed her ADL's PTA  Self Care: Did the patient need help bathing, dressing, using the toilet or eating?  Independent  Indoor Mobility: Did the patient need assistance with walking from room to room (with or without device)? Independent  Stairs: Did the patient need assistance with internal or external stairs (with or without device)? Independent  Functional Cognition: Did the patient need help planning regular tasks such as shopping or remembering to take medications? Independent  Home Assistive Devices / Equipment Home Assistive Devices/Equipment: None Home Equipment: None  Prior Device Use: Indicate devices/aids used by the patient prior to current illness, exacerbation or injury? None of the above  Current Functional Level Cognition  Arousal/Alertness: Awake/alert Overall Cognitive Status: Within Functional Limits for tasks assessed Current Attention Level: Alternating Orientation Level: Oriented X4 General Comments: delayed processing, STM deficits Attention: Sustained Sustained Attention: Appears intact Memory: Impaired Memory Impairment: Retrieval deficit (4/5) Awareness: Impaired Awareness Impairment: Intellectual impairment Problem Solving: Impaired Problem Solving Impairment: Verbal basic Executive Function: Self Monitoring,Self Correcting Safety/Judgment: Impaired    Extremity Assessment (includes Sensation/Coordination)  Upper Extremity Assessment: RUE deficits/detail,LUE deficits/detail RUE Deficits / Details: Rt UE tremulous.  AROM WFL grossly 4/5 RUE Coordination: decreased fine motor LUE Deficits / Details: Pt with h/o Rt shoulder injury with very limited ROM of shoulder.  Elbow distally WFL, mildly tremulous LUE Coordination: decreased fine motor  Lower Extremity Assessment: Defer to PT evaluation    ADLs  Overall ADL's : Needs assistance/impaired Eating/Feeding: Set up,Sitting Grooming: Wash/dry hands,Wash/dry face,Oral care,Brushing  hair,Min guard,Standing Grooming Details (indicate cue type and reason): Patient completed 3/3 grooming tasks standing at sink level with use of RW. Min guard for safety. Upper Body Bathing: Min guard,Sitting Lower Body Bathing: Maximal assistance,Sit to/from stand Upper Body Dressing : Minimal assistance,Sitting Lower Body Dressing: Moderate assistance,Sit to/from stand Toilet Transfer: Min Hydrologist Details (indicate cue type and reason): Min guard for safety with use of RW. Good safety awareness. Toileting- Architect and Hygiene: Min guard,Sit to/from stand Toileting - Architect Details (indicate cue type and  reason): Patient able to complete 3/3 parts of toileting tasks in sitting/standing with Min guard for safety. Functional mobility during ADLs: Min guard,Rolling walker General ADL Comments: pt noted to have drainage and odor on posterior back bandage. pt with new bandage applied this session. pt and daughter thanking staff    Mobility  Overal bed mobility: Needs Assistance Bed Mobility: Supine to Sit Supine to sit: Min guard General bed mobility comments: Min guard for safety +rail with HOB flat to simulate home environment.    Transfers  Overall transfer level: Needs assistance Equipment used: Rolling walker (2 wheeled) Transfers: Sit to/from Stand Sit to Stand: Min guard,Min assist Stand pivot transfers: Mod assist,+2 safety/equipment,Min assist,+2 physical assistance General transfer comment: Min guard from elevated surfaces and Min A from low surfaces. Cues for hand placement.    Ambulation / Gait / Stairs / Wheelchair Mobility  Ambulation/Gait Ambulation/Gait assistance: Herbalist (Feet): 40 Feet Assistive device: Rolling walker (2 wheeled) Gait Pattern/deviations: Trunk flexed,Decreased stride length General Gait Details: vc's for posture, unsteady gait, narrow BOS, decreased pace, has difficulty with navigation  around obstacles Gait velocity: decreased Gait velocity interpretation: <1.31 ft/sec, indicative of household ambulator    Posture / Balance Dynamic Sitting Balance Sitting balance - Comments: able to reach to socks in sitting without LOB but increased time needed to perform safely and close guarding Balance Overall balance assessment: Needs assistance Sitting-balance support: Single extremity supported,No upper extremity supported,Feet supported Sitting balance-Leahy Scale: Good Sitting balance - Comments: able to reach to socks in sitting without LOB but increased time needed to perform safely and close guarding Standing balance support: Bilateral upper extremity supported Standing balance-Leahy Scale: Poor Standing balance comment: Reliant on BUE support on RW. High Level Balance Comments: worked on standing balance activities at counter with unilateral support and min A including: wt shifting with eyes closed, tandem stance, unilateral stance, side stepping    Special needs/care consideration Decadron taper     Previous Home Environment (from acute therapy documentation) Living Arrangements: Alone  Lives With: Alone Available Help at Discharge: Family,Available 24 hours/day Type of Home: Mobile home Home Access: Stairs to enter Entrance Stairs-Rails: Surveyor, mining of Steps: 9 Bathroom Shower/Tub: Multimedia programmer: Handicapped height [place]: No Additional Comments: Many family member live adjacent to her, sister does not work and can assist  Discharge Living Setting Plans for Discharge Living Setting: Patient's home Type of Home at Discharge: Mobile home Discharge Home Layout: One level Discharge Home Access: Stairs to enter Entrance Stairs-Rails: Surveyor, mining of Steps: 9 Discharge Bathroom Shower/Tub: Walk-in shower Discharge Bathroom Toilet: Handicapped height Does the patient have any problems obtaining  your medications?: No  Social/Family/Support Systems Anticipated Caregiver: multiple family members, daughter is main contact Public relations account executive) Anticipated Caregiver's Contact Information: [name] [phone]; Manuela Schwartz (sister) [phone] Ability/Limitations of Caregiver: supervision to min guard Caregiver Availability: 24/7 Discharge Plan Discussed with Primary Caregiver: Yes Is Caregiver In Agreement with Plan?: Yes Does Caregiver/Family have Issues with Lodging/Transportation while Pt is in Rehab?: No   Goals Patient/Family Goal for Rehab: PT/OT mod I, SLP supervision to mod I Expected length of stay: 7-10 days Pt/Family Agrees to Admission and willing to participate: Yes Program Orientation Provided & Reviewed with Pt/Caregiver Including Roles  & Responsibilities: Yes   Decrease burden of Care through IP rehab admission: n/a   Possible need for SNF placement upon discharge:n/a   Patient Condition: This patient's medical and functional status has changed since the consult  dated: 09/15/20 in which the Rehabilitation Physician determined and documented that the patient's condition is appropriate for intensive rehabilitative care in an inpatient rehabilitation facility. See "History of Present Illness" (above) for medical update. Functional changes are: progression from Min A +2 for transfers to Min A and progression from no gait to Min A for 40 feet. Patient's medical and functional status update has been discussed with the Rehabilitation physician and patient remains appropriate for inpatient rehabilitation. Will admit to inpatient rehab today.  Preadmission Screen Completed By:  Shann Medal, 09/17/2020 11:30 AM  With day of admit updates completed by Raechel Ache OTR/L ______________________________________________________________________   Discussed status with Dr. Posey Pronto on 09/17/20 at 11:15AM and received approval for admission today.  Admission Coordinator:  Shann Medal  With day of  admit updates completed by Raechel Ache OTR/L on , time 11:15AM/Date 09/17/20

## 2020-09-15 NOTE — Plan of Care (Signed)
  Problem: Education: Goal: Knowledge of General Education information will improve Description: Including pain rating scale, medication(s)/side effects and non-pharmacologic comfort measures Outcome: Progressing   Problem: Health Behavior/Discharge Planning: Goal: Ability to manage health-related needs will improve Outcome: Progressing   Problem: Clinical Measurements: Goal: Ability to maintain clinical measurements within normal limits will improve Outcome: Progressing Goal: Will remain free from infection Outcome: Progressing Goal: Diagnostic test results will improve Outcome: Progressing Goal: Respiratory complications will improve Outcome: Progressing Goal: Cardiovascular complication will be avoided Outcome: Progressing   Problem: Activity: Goal: Risk for activity intolerance will decrease Outcome: Progressing   Problem: Nutrition: Goal: Adequate nutrition will be maintained Outcome: Progressing   Problem: Coping: Goal: Level of anxiety will decrease Outcome: Progressing   Problem: Elimination: Goal: Will not experience complications related to bowel motility Outcome: Progressing Goal: Will not experience complications related to urinary retention Outcome: Progressing   Problem: Pain Managment: Goal: General experience of comfort will improve Outcome: Progressing   Problem: Safety: Goal: Ability to remain free from injury will improve Outcome: Progressing   Problem: Skin Integrity: Goal: Risk for impaired skin integrity will decrease Outcome: Progressing   Problem: Education: Goal: Knowledge of the prescribed therapeutic regimen will improve Outcome: Progressing   Problem: Activity: Goal: Ability to tolerate increased activity will improve Outcome: Progressing   Problem: Health Behavior/Discharge Planning: Goal: Identification of resources available to assist in meeting health care needs will improve Outcome: Progressing   Problem:  Nutrition: Goal: Maintenance of adequate nutrition will improve Outcome: Progressing   Problem: Clinical Measurements: Goal: Complications related to the disease process, condition or treatment will be avoided or minimized Outcome: Progressing   Problem: Respiratory: Goal: Will regain and/or maintain adequate ventilation Outcome: Progressing   Problem: Skin Integrity: Goal: Demonstration of wound healing without infection will improve Outcome: Progressing   

## 2020-09-15 NOTE — Progress Notes (Addendum)
Pt's BP not responding to prn Vasotec or Labetalol IV, but had good response to Hydralazine IV. Zierle-Ghosh informed. Per Zierle-Ghosh, she is ok w/ the BP 154/72 and would look through pt's chart d/t prn parameters of to give for SBP >140. See new order.

## 2020-09-15 NOTE — Progress Notes (Signed)
Occupational Therapy Treatment Patient Details Name: [name] Page MRN: [id number] DOB: [birth year] Today's Date: 09/15/2020    History of present illness [name] Page is a 69 y.o. 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 Admitted due to weakness, AMS. MRI revealed 3.3 x 4.1 x 2.4 cm meningioma at the skull base on the right. Mass-effect upon the inferior right frontal lobe. Underwent resection 12/23.   OT comments  Pt progressed from bed to sink level grooming task with bathroom transfer. Recommendation for CIR to help progress to MOD I level for d/c. Pt noted to have drainage and odor to bandages this session. Pt reports now having more awareness to odor of wounds. Daughter present and very thankful for staff this admission.    Follow Up Recommendations  CIR    Equipment Recommendations  None recommended by OT    Recommendations for Other Services Rehab consult    Precautions / Restrictions Precautions Precautions: Fall Restrictions Weight Bearing Restrictions: No       Mobility Bed Mobility Overal bed mobility: Needs Assistance Bed Mobility: Supine to Sit     Supine to sit: Min guard     General bed mobility comments: min-guard A for safety, vc;'s for sequencing, heavy use of rail  Transfers Overall transfer level: Needs assistance Equipment used: Rolling walker (2 wheeled) Transfers: Sit to/from Stand Sit to Stand: Min assist;+2 safety/equipment         General transfer comment: vc's for hand placement, min A to steady, +2 for safety with first 2 stands.    Balance Overall balance assessment: Needs assistance Sitting-balance support: Single extremity supported;No upper extremity supported;Feet supported Sitting balance-Leahy Scale: Fair     Standing balance support: Single extremity supported;Bilateral upper extremity supported Standing  balance-Leahy Scale: Poor Standing balance comment: reliant on external support                           ADL either performed or assessed with clinical judgement   ADL Overall ADL's : Needs assistance/impaired     Grooming: Wash/dry hands;Wash/dry face;Oral care;Min guard;Standing   Upper Body Bathing: Min guard;Sitting   Lower Body Bathing: Maximal assistance;Sit to/from stand           Toilet Transfer: Minimal assistance;BSC;RW           Functional mobility during ADLs: Min guard;Rolling walker General ADL Comments: pt noted to have drainage and odor on posterior back bandage. pt with new bandage applied this session. pt and daughter Social research officer, government     Vision       Perception     Praxis      Cognition Arousal/Alertness: Awake/alert Behavior During Therapy: WFL for tasks assessed/performed Overall Cognitive Status: Impaired/Different from baseline Area of Impairment: Attention;Problem solving                   Current Attention Level: Alternating         Problem Solving: Requires verbal cues;Difficulty sequencing;Slow processing General Comments: delayed processing        Exercises Exercises: Other exercises Other Exercises Other Exercises: standing withh eyes closed attending to pressure through toes and then heels. Other Exercises: Mini squats with eyes closed 5x Other Exercises: wt shifting with eyes closed and min A   Shoulder Instructions       General Comments Mild dizziness, improved with time up. VSS. Daughter present  Pertinent Vitals/ Pain       Pain Assessment: No/denies pain  Home Living                                          Prior Functioning/Environment              Frequency  Min 2X/week        Progress Toward Goals  OT Goals(current goals can now be found in the care plan section)  Progress towards OT goals: Progressing toward goals  Acute Rehab OT Goals Patient Stated  Goal: I need to get stronger, I'd like to be independent OT Goal Formulation: With patient Time For Goal Achievement: 09/26/20 Potential to Achieve Goals: Good ADL Goals Pt Will Perform Grooming: with min guard assist;standing Pt Will Perform Upper Body Bathing: with set-up;with supervision;sitting Pt Will Perform Lower Body Bathing: with min guard assist;sit to/from stand Pt Will Perform Upper Body Dressing: with set-up;with supervision;sitting Pt Will Perform Lower Body Dressing: with min guard assist;sit to/from stand Pt Will Transfer to Toilet: with min guard assist;ambulating;regular height toilet Pt Will Perform Toileting - Clothing Manipulation and hygiene: with min guard assist;sit to/from stand Additional ADL Goal #1: pt will divide attention with min cues during familiar ADLs  Plan Discharge plan remains appropriate    Co-evaluation    PT/OT/SLP Co-Evaluation/Treatment: Yes Reason for Co-Treatment: For patient/therapist safety PT goals addressed during session: Mobility/safety with mobility;Balance;Proper use of DME;Strengthening/ROM OT goals addressed during session: ADL's and self-care;Proper use of Adaptive equipment and DME;Strengthening/ROM      AM-PAC OT "6 Clicks" Daily Activity     Outcome Measure   Help from another person eating meals?: A Little Help from another person taking care of personal grooming?: A Little Help from another person toileting, which includes using toliet, bedpan, or urinal?: A Lot Help from another person bathing (including washing, rinsing, drying)?: A Lot Help from another person to put on and taking off regular upper body clothing?: A Little Help from another person to put on and taking off regular lower body clothing?: A Lot 6 Click Score: 15    End of Session Equipment Utilized During Treatment: Gait belt;Rolling walker  OT Visit Diagnosis: Unsteadiness on feet (R26.81)   Activity Tolerance Patient tolerated treatment well    Patient Left Other (comment) (walking with PT vicky)   Nurse Communication Mobility status        Time: [id number] OT Time Calculation (min): 17 min  Charges: OT General Charges $OT Visit: 1 Visit OT Treatments $Self Care/Home Management : 8-22 mins   Brynn, OTR/L  Acute Rehabilitation Services Pager: [phone] Office: [phone] .    Jeri Modena 09/15/2020, 3:29 PM

## 2020-09-15 NOTE — Consult Note (Signed)
Physical Medicine and Rehabilitation Consult Reason for Consult: Weakness with altered mental status Referring Physician: [facility]   HPI: [name] Page is a 69 y.o. 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  Per chart review patient lives alone.  Mobile home with multiple steps.  She has been many family members that live close by to check on her routinely.  Reportedly independent prior to admission.  Presented 09/04/2020 with progressive weakness particularly involving bilateral lower extremities/headache x2 months as well as family reporting episodes of forgetfulness and confusion.  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  There was a leftward midline shift with subfalcine herniation of the right cingulate gyrus with early entrapment of the left lateral ventricle.  CT renal stone study completed for some hematuria that showed mild right renal pelvis dilatation without significant hydroureter secondary to 3 mm stone in the mid to distal right ureter at the level of the upper sacrum.  Patient underwent right pterional craniotomy for resection of anterior skull base mass 09/11/2020 per Dr. Duffy Rhody.  Decadron protocol as indicated.  Keppra for seizure prophylaxis.  Subcutaneous Lovenox for DVT prophylaxis initiated 09/12/2020.  Currently on mechanical soft diet.  Physical Medicine & Rehabilitation was consulted to assess candidacy for CIR for impaired mobility and ADLs. She lived independently prior to admission. Family is able to provide 24/7 support if needed.    Review of Systems  Constitutional: Negative for chills and fever.  HENT: Negative for hearing loss.   Eyes: Negative for blurred vision and double vision.  Respiratory: Negative for cough and shortness of breath.   Cardiovascular: Positive for leg  swelling. Negative for chest pain and palpitations.  Gastrointestinal: Positive for constipation. Negative for heartburn, nausea and vomiting.  Genitourinary: Positive for dysuria and hematuria. Negative for flank pain.  Musculoskeletal: Positive for myalgias.  Skin: Negative for rash.  Neurological: Positive for weakness and headaches.  Psychiatric/Behavioral: Positive for memory loss.  All other systems reviewed and are negative.  Past Medical History:  Diagnosis Date  . Arthritis   . Basal cell carcinoma   . 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 Cancer ([place])    melanoma  . Dysuria   . Frequency of urination   . Hematuria   . History of kidney stones   . Osteoporosis   . Renal calculus, bilateral   . 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 Urgency of urination   . Wears glasses    Past Surgical History:  Procedure Laterality Date  . CYSTOSCOPY WITH RETROGRADE PYELOGRAM, URETEROSCOPY AND STENT PLACEMENT Bilateral 08/11/2013   Procedure: CYSTOSCOPY WITH RETROGRADE PYELOGRAM, URETEROSCOPY AND STENT PLACEMENT;  Surgeon: Alexis Frock, MD;  Location: [facility];  Service: Urology;  Laterality: Bilateral;  . CYSTOSCOPY WITH URETEROSCOPY AND STENT PLACEMENT Bilateral 08/29/2013   Procedure: CYSTOSCOPY WITH BILATERAL URETEROSCOPY AND STENT REPLACEMENTS, stone extraction;  Surgeon: Alexis Frock, MD;  Location: [facility];  Service: Urology;  Laterality: Bilateral;  . CYSTOSCOPY/RETROGRADE/URETEROSCOPY/STONE EXTRACTION WITH BASKET Bilateral 01/31/2016   Procedure: CYSTOSCOPY/BILATERAL RETROGRADE PYELOGRAMS/BILATERAL URETEROSCOPY/BILATERAL STONE EXTRACTION WITH BASKET/RIGHT URETERAL STENT PLACEMENT;  Surgeon: Raynelle Bring, MD;  Location: [facility];  Service: Urology;  Laterality: Bilateral;  . HEMIARTHROPLASTY SHOULDER FRACTURE Left 1992  . HOLMIUM LASER APPLICATION Bilateral [id number]  Procedure: HOLMIUM LASER APPLICATION;  Surgeon:  Sebastian Ache, MD;  Location: [facility];  Service: Urology;  Laterality: Bilateral;   Family History  Problem Relation Age of Onset  . Cancer Mother        unknown source, stomach and liver   . Atrial fibrillation Father   . Hyperlipidemia Father   . Hypertension Father   . Colon polyps Father   . Hypertension Sister   . Hyperlipidemia Sister   . Thyroid disease Sister   . Esophageal cancer Neg Hx   . Pancreatic cancer Neg Hx    Social History:  reports that she has never smoked. She has never used smokeless tobacco. She reports that she does not drink alcohol and does not use drugs. Allergies:  Allergies  Allergen Reactions  . Sulfa Antibiotics Rash   Medications Prior to Admission  Medication Sig Dispense Refill  . atorvastatin (LIPITOR) 20 MG tablet TAKE 1 TABLET BY MOUTH EVERY DAY 30 tablet 0  . Calcium Carbonate-Vit D-Min (CALCIUM 1200 PO) Take 1 tablet by mouth daily.    Marland Kitchen escitalopram (LEXAPRO) 10 MG tablet TAKE 1 TABLET BY MOUTH EVERY DAY 90 tablet 1  . ferrous sulfate 325 (65 FE) MG tablet Take 325 mg by mouth daily with breakfast.    . Glycerin-Hypromellose-PEG 400 0.2-0.2-1 % SOLN Apply 1 drop to eye 2 (two) times daily as needed (dry eyes).     Marland Kitchen ibuprofen (ADVIL,MOTRIN) 200 MG tablet Take 200-400 mg by mouth every 6 (six) hours as needed for moderate pain.    . Multiple Vitamin (MULTIVITAMIN WITH MINERALS) TABS tablet Take 1 tablet by mouth daily.    Marland Kitchen loratadine (CLARITIN) 10 MG tablet Take 10 mg by mouth daily as needed for allergies.      Home: Home Living Family/patient expects to be discharged to:: Private residence Living Arrangements: Alone Available Help at Discharge: Family,Available 24 hours/day Type of Home: Mobile home Home Access: Stairs to enter Entrance Stairs-Number of Steps: 9 Entrance Stairs-Rails: Right,Left Bathroom Shower/Tub: Health visitor: Handicapped height Home Equipment: None Additional Comments:  Many family member live adjacent to her, sister does not work and can assist  Lives With: Alone  Functional History: Prior Function Level of Independence: Independent Comments: drove and completed her ADL's PTA Functional Status:  Mobility: Bed Mobility Overal bed mobility: Needs Assistance Bed Mobility: Supine to Sit Supine to sit: Min assist General bed mobility comments: minor stability assist.  slow to w/shift and scoot Transfers Overall transfer level: Needs assistance Equipment used: 2 person hand held assist Transfers: Sit to/from [employer] Sit to Stand: Min assist,+2 physical assistance,+2 safety/equipment Stand pivot transfers: Mod assist,+2 safety/equipment,Min assist,+2 physical assistance General transfer comment: cues for hand placement, assist forward and minimal boost, light mod assist for pivot to chair or side stepping toward HOB.      ADL: ADL Overall ADL's : Needs assistance/impaired Eating/Feeding: Set up,Sitting Grooming: Wash/dry hands,Wash/dry face,Oral care,Minimal assistance,Sitting Upper Body Bathing: Minimal assistance,Sitting Lower Body Bathing: Moderate assistance,Sit to/from stand Upper Body Dressing : Minimal assistance,Sitting Lower Body Dressing: Moderate assistance,Sit to/from stand Toilet Transfer: Moderate assistance,+2 for physical assistance,+2 for safety/equipment,Stand-pivot,BSC Toileting- Clothing Manipulation and Hygiene: Maximal assistance,Sit to/from stand Functional mobility during ADLs: Moderate assistance,+2 for physical assistance,+2 for safety/equipment  Cognition: Cognition Overall Cognitive Status: Impaired/Different from baseline Arousal/Alertness: Awake/alert Orientation Level: Oriented X4 Attention: Sustained Sustained Attention: Appears intact Memory: Impaired Memory Impairment: Retrieval deficit (4/5) Awareness: Impaired Awareness Impairment: Intellectual impairment Problem Solving:  Impaired Problem Solving Impairment: Verbal basic Executive Function: Self Monitoring,Self Correcting Safety/Judgment: Impaired Cognition Arousal/Alertness: Awake/alert Behavior During Therapy: WFL for tasks assessed/performed Overall Cognitive Status: Impaired/Different from baseline Area of Impairment: Attention,Problem solving Current Attention Level: Selective Problem Solving: Requires verbal cues,Difficulty sequencing,Slow processing  Blood pressure (!) 150/71, pulse 71, temperature 97.7 F (36.5 C), temperature source Oral, resp. rate 14, height 5\' 2"  (1.575 m), weight 63.9 kg, SpO2 96 %. Physical Exam General: Alert, No apparent distress HEENT: Craniotomy site clean and dry. Neck: Supple without JVD or lymphadenopathy Heart: Reg rate and rhythm. No murmurs rubs or gallops Chest: CTA bilaterally without wheezes, rales, or rhonchi; no distress Abdomen: Soft, non-tender, non-distended, bowel sounds positive. Extremities: No clubbing, cyanosis, or edema. Pulses are 2+ Skin: Clean and intact without signs of breakdown Neuro: Patient is alert in no acute distress.  Makes eye contact with examiner.  Provides her name and age.  Some mild delay in processing.  Follows commands. 4/5 strength throughout Musculoskeletal: Full ROM, No pain with AROM or PROM in the neck, trunk, or extremities. Posture appropriate Psych: Pt's affect is appropriate. Pt is cooperative  No results found for this or any previous visit (from the past 24 hour(s)). No results found.   Assessment/Plan: Diagnosis: s/p meningioma resection 1. Does the need for close, 24 hr/day medical supervision in concert with the patient's rehab needs make it unreasonable for this patient to be served in a less intensive setting? Yes 2. Co-Morbidities requiring supervision/potential complications: altered mental status, vasogenic cerebral edema, generalized weakness, squamous cell carcinoma of the skin, nephrolithiasis,  UTI 3. Due to bladder management, bowel management, safety, skin/wound care, disease management, medication administration, pain management and patient education, does the patient require 24 hr/day rehab nursing? Yes 4. Does the patient require coordinated care of a physician, rehab nurse, therapy disciplines of PT, OT, SLP to address physical and functional deficits in the context of the above medical diagnosis(es)? Yes Addressing deficits in the following areas: balance, endurance, locomotion, strength, transferring, bowel/bladder control, bathing, dressing, feeding, grooming, toileting 5. Can the patient actively participate in an intensive therapy program of at least 3 hrs of therapy per day at least 5 days per week? Yes 6. The potential for patient to make measurable gains while on inpatient rehab is excellent 7. Anticipated functional outcomes upon discharge from inpatient rehab are modified independent  with PT, modified independent with OT, independent to S with SLP. 8. Estimated rehab length of stay to reach the above functional goals is: 10-14 days 9. Anticipated discharge destination: Home 10. Overall Rehab/Functional Prognosis: excellent  RECOMMENDATIONS: This patient's condition is appropriate for continued rehabilitative care in the following setting: CIR Patient has agreed to participate in recommended program. Yes Note that insurance prior authorization may be required for reimbursement for recommended care.  Comment:  1) Meningioma s/p resection: Incision healing well. Staples to be removed 1/04. 2) Impaired mobility and ADLs: Currently Min-Mod Ax2 10 feet RW, staggering gait: admit to CIR, estimated 10-14 day stay. 3) Slow processing: can likely be independent on discharge, but will have 24/7 family support if needed.  4) Labile BP: continue to monitor TID in CIR  Thank you for this consult. Admission coordinator to follow.   I have personally performed a face to face  diagnostic evaluation, including, but not limited to relevant history and physical exam findings, of this patient and developed relevant assessment and plan.  Additionally, I have reviewed and concur with the physician assistant's documentation above.  Leeroy Cha, MD  Lavon Paganini Hawkins, PA-C 09/15/2020

## 2020-09-15 NOTE — Progress Notes (Signed)
Physical Therapy Treatment Patient Details Name: [name] Page MRN: [id number] DOB: 09/12/1951 Today's Date: 09/15/2020    History of Present Illness [name] Page is a 69 y.o. 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 Admitted due to weakness, AMS. MRI revealed 3.3 x 4.1 x 2.4 cm meningioma at the skull base on the right. Mass-effect upon the inferior right frontal lobe. Underwent resection 12/23.    PT Comments    Pt mobilizing well today, mild dizziness especially with initial standing. Pt came to EOB with min-guard A and heavy use of rail. OT present for ambulation to bathroom and use of toilet. Pt ambulated with RW and min A +2 for equipment. Pt maintained flexed posture and very short step length. Without RW and min/ mod HHA pt had staggering gait. Worked on proprioceptive activities with eyes opened and closed in standing. VSS throughout. Continue to recommend aggressive rehab in CIR environment. Pt agreeable. PT will continue to follow.    Follow Up Recommendations  CIR;Supervision/Assistance - 24 hour     Equipment Recommendations  Other (comment);Rolling walker with 5" wheels (TBD)    Recommendations for Other Services Rehab consult     Precautions / Restrictions Precautions Precautions: Fall Restrictions Weight Bearing Restrictions: No    Mobility  Bed Mobility Overal bed mobility: Needs Assistance Bed Mobility: Supine to Sit     Supine to sit: Min guard     General bed mobility comments: min-guard A for safety, vc;'s for sequencing, heavy use of rail  Transfers Overall transfer level: Needs assistance Equipment used: Rolling walker (2 wheeled) Transfers: Sit to/from Stand Sit to Stand: Min assist;+2 safety/equipment         General transfer comment: vc's for hand placement, min A to steady, +2 for safety with first 2  stands.  Ambulation/Gait Ambulation/Gait assistance: Min assist;+2 safety/equipment;Mod assist Gait Distance (Feet): 10 Feet (4x) Assistive device: Rolling walker (2 wheeled) Gait Pattern/deviations: Trunk flexed;Decreased stride length Gait velocity: decreased Gait velocity interpretation: <1.31 ft/sec, indicative of household ambulator General Gait Details: vc's for erect posture, very small steps, able to increase step length minimally with cues. Needed frequent rest breaks to reset posture. Ambulated 10' without RW with HHA mod A due to staggerring gait   Stairs             Wheelchair Mobility    Modified Rankin (Stroke Patients Only)       Balance Overall balance assessment: Needs assistance Sitting-balance support: Single extremity supported;No upper extremity supported;Feet supported Sitting balance-Leahy Scale: Fair     Standing balance support: Single extremity supported;Bilateral upper extremity supported Standing balance-Leahy Scale: Poor Standing balance comment: reliant on external support                            Cognition Arousal/Alertness: Awake/alert Behavior During Therapy: WFL for tasks assessed/performed Overall Cognitive Status: Impaired/Different from baseline Area of Impairment: Attention;Problem solving                   Current Attention Level: Alternating         Problem Solving: Requires verbal cues;Difficulty sequencing;Slow processing General Comments: delayed processing      Exercises Other Exercises Other Exercises: standing withh eyes closed attending to pressure through toes and then heels. Other Exercises: Mini squats with eyes closed 5x Other Exercises: wt shifting with eyes closed and min A  General Comments General comments (skin integrity, edema, etc.): Mild dizziness, improved with time up. VSS. Daughter present      Pertinent Vitals/Pain Pain Assessment: No/denies pain    Home Living                       Prior Function            PT Goals (current goals can now be found in the care plan section) Acute Rehab PT Goals Patient Stated Goal: I need to get stronger, I'd like to be independent PT Goal Formulation: With patient Time For Goal Achievement: 09/26/20 Potential to Achieve Goals: Good Progress towards PT goals: Progressing toward goals    Frequency    Min 3X/week      PT Plan Current plan remains appropriate    Co-evaluation PT/OT/SLP Co-Evaluation/Treatment: Yes Reason for Co-Treatment: For patient/therapist safety;Complexity of the patient's impairments (multi-system involvement) PT goals addressed during session: Mobility/safety with mobility;Balance;Proper use of DME;Strengthening/ROM        AM-PAC PT "6 Clicks" Mobility   Outcome Measure  Help needed turning from your back to your side while in a flat bed without using bedrails?: A Little Help needed moving from lying on your back to sitting on the side of a flat bed without using bedrails?: A Little Help needed moving to and from a bed to a chair (including a wheelchair)?: A Lot Help needed standing up from a chair using your arms (e.g., wheelchair or bedside chair)?: A Little Help needed to walk in hospital room?: A Little Help needed climbing 3-5 steps with a railing? : A Lot 6 Click Score: 16    End of Session Equipment Utilized During Treatment: Gait belt Activity Tolerance: Patient tolerated treatment well Patient left: in chair;with call bell/phone within reach;with chair alarm set;with family/visitor present Nurse Communication: Mobility status PT Visit Diagnosis: Unsteadiness on feet (R26.81);Other abnormalities of gait and mobility (R26.89);Other symptoms and signs involving the nervous system (R29.898)     Time: 8119-1478 PT Time Calculation (min) (ACUTE ONLY): 37 min  Charges:  $Gait Training: 8-22 mins                     Lyanne Co, PT  Acute Rehab  Services  Pager [phone] Office [phone]    [name] Page 09/15/2020, 12:09 PM

## 2020-09-15 NOTE — Progress Notes (Signed)
Inpatient Rehab Admissions:  Inpatient Rehab Consult received.  I spoke to pt's daughter over the phone while she was at pt's bedside for rehabilitation assessment and to discuss goals and expectations of an inpatient rehab admission.  Pt and family are hopeful for CIR, and family can provide 24/7 supervision, if necessary, on d/c from CIR.  Will await consult from [place] for goals, ELOS, but we reviewed average LOS to be about 2 weeks, and typical goals of at least some level of supervision.  Per Dr. Manon Hilding note today, pt medically stable.  Will follow for probable admission pending results of consult with PMR physician and bed availability.    Signed: Shann Medal, PT, DPT Admissions Coordinator [phone] 09/15/20  1:57 PM

## 2020-09-16 ENCOUNTER — Other Ambulatory Visit: Payer: Self-pay | Admitting: Radiation Therapy

## 2020-09-16 DIAGNOSIS — D329 Benign neoplasm of meninges, unspecified: Secondary | ICD-10-CM | POA: Diagnosis not present

## 2020-09-16 DIAGNOSIS — N1831 Chronic kidney disease, stage 3a: Secondary | ICD-10-CM | POA: Diagnosis not present

## 2020-09-16 DIAGNOSIS — C4492 Squamous cell carcinoma of skin, unspecified: Secondary | ICD-10-CM | POA: Diagnosis not present

## 2020-09-16 DIAGNOSIS — R531 Weakness: Secondary | ICD-10-CM | POA: Diagnosis not present

## 2020-09-16 LAB — CBC
HCT: 27.3 % — ABNORMAL LOW (ref 36.0–46.0)
Hemoglobin: 8.5 g/dL — ABNORMAL LOW (ref 12.0–15.0)
MCH: 24.8 pg — ABNORMAL LOW (ref 26.0–34.0)
MCHC: 31.1 g/dL (ref 30.0–36.0)
MCV: 79.6 fL — ABNORMAL LOW (ref 80.0–100.0)
Platelets: 146 10*3/uL — ABNORMAL LOW (ref 150–400)
RBC: 3.43 MIL/uL — ABNORMAL LOW (ref 3.87–5.11)
RDW: 21.2 % — ABNORMAL HIGH (ref 11.5–15.5)
WBC: 8.6 10*3/uL (ref 4.0–10.5)
nRBC: 0 % (ref 0.0–0.2)

## 2020-09-16 LAB — BASIC METABOLIC PANEL
Anion gap: 7 (ref 5–15)
BUN: 22 mg/dL (ref 8–23)
CO2: 19 mmol/L — ABNORMAL LOW (ref 22–32)
Calcium: 7.3 mg/dL — ABNORMAL LOW (ref 8.9–10.3)
Chloride: 111 mmol/L (ref 98–111)
Creatinine, Ser: 0.93 mg/dL (ref 0.44–1.00)
GFR, Estimated: >60 mL/min (ref >60)
Glucose, Bld: 110 mg/dL — ABNORMAL HIGH (ref 70–99)
Potassium: 4 mmol/L (ref 3.5–5.1)
Sodium: 137 mmol/L (ref 135–145)

## 2020-09-16 LAB — SURGICAL PATHOLOGY

## 2020-09-16 MED ORDER — HYDRALAZINE HCL 25 MG PO TABS
25.0000 mg | ORAL_TABLET | Freq: Three times a day (TID) | ORAL | Status: DC
  Administered 2020-09-16 – 2020-09-17 (×4): 25 mg via ORAL
  Filled 2020-09-16 (×4): qty 1

## 2020-09-16 MED ORDER — COVID-19 MRNA VACC (MODERNA) 50 MCG/0.25ML IM SUSP
0.2500 mL | Freq: Once | INTRAMUSCULAR | Status: AC
  Administered 2020-09-16: 18:00:00 0.25 mL via INTRAMUSCULAR
  Filled 2020-09-16 (×2): qty 0.25

## 2020-09-16 NOTE — Progress Notes (Signed)
Physical Therapy Treatment Patient Details Name: [name] Page MRN: [id number] DOB: 05-Apr-1951 Today's Date: 09/16/2020    History of Present Illness [name] Page is a 69 y.o. 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 Admitted due to weakness, AMS. MRI revealed 3.3 x 4.1 x 2.4 cm meningioma at the skull base on the right. Mass-effect upon the inferior right frontal lobe. Underwent resection 12/23.    PT Comments    Pt working to increase her activity level, getting up to ambulate to bathroom assisted by nursing. Therapy session focused on standing balance including standing with eyes closed, tandem and unilateral stance, side stepping, and wt shifting. Pt continues to need support of RW and min A for ambulation due to decreased stability. Ambulated 40'. PT will continue to follow.    Follow Up Recommendations  CIR;Supervision/Assistance - 24 hour     Equipment Recommendations  Other (comment);Rolling walker with 5" wheels (TBD)    Recommendations for Other Services Rehab consult     Precautions / Restrictions Precautions Precautions: Fall Restrictions Weight Bearing Restrictions: No    Mobility  Bed Mobility Overal bed mobility: Needs Assistance Bed Mobility: Supine to Sit     Supine to sit: Min guard     General bed mobility comments: min-guard A for safety, vc;'s for sequencing. Increased time needed  Transfers Overall transfer level: Needs assistance Equipment used: Rolling walker (2 wheeled) Transfers: Sit to/from Stand Sit to Stand: Min assist         General transfer comment: min A for safety, especially from low surface. Pt unsteady with initial standing and reports feeling stiff especially in her calves. Takes increased time to come to full height  Ambulation/Gait Ambulation/Gait assistance: Editor, commissioning (Feet): 40 Feet Assistive  device: Rolling walker (2 wheeled) Gait Pattern/deviations: Trunk flexed;Decreased stride length Gait velocity: decreased Gait velocity interpretation: <1.31 ft/sec, indicative of household ambulator General Gait Details: vc's for posture, unsteady gait, narrow BOS, decreased pace, has difficulty with navigation around obstacles   Stairs             Wheelchair Mobility    Modified Rankin (Stroke Patients Only)       Balance Overall balance assessment: Needs assistance Sitting-balance support: Single extremity supported;No upper extremity supported;Feet supported Sitting balance-Leahy Scale: Good Sitting balance - Comments: able to reach to socks in sitting without LOB but increased time needed to perform safely and close guarding   Standing balance support: Single extremity supported;Bilateral upper extremity supported Standing balance-Leahy Scale: Poor Standing balance comment: reliant on external support               High Level Balance Comments: worked on standing balance activities at counter with unilateral support and min A including: wt shifting with eyes closed, tandem stance, unilateral stance, side stepping            Cognition Arousal/Alertness: Awake/alert Behavior During Therapy: WFL for tasks assessed/performed Overall Cognitive Status: Impaired/Different from baseline Area of Impairment: Attention;Problem solving                   Current Attention Level: Alternating         Problem Solving: Requires verbal cues;Difficulty sequencing;Slow processing General Comments: delayed processing, STM deficits      Exercises      General Comments General comments (skin integrity, edema, etc.): HR 77 bpm, SpO2 in 90's  Pertinent Vitals/Pain Pain Assessment: No/denies pain    Home Living                      Prior Function            PT Goals (current goals can now be found in the care plan section) Acute Rehab PT  Goals Patient Stated Goal: I need to get stronger, I'd like to be independent. Get other cancers removed ASAP PT Goal Formulation: With patient Time For Goal Achievement: 09/26/20 Potential to Achieve Goals: Good Progress towards PT goals: Progressing toward goals    Frequency    Min 3X/week      PT Plan Current plan remains appropriate    Page-evaluation              AM-PAC PT "6 Clicks" Mobility   Outcome Measure  Help needed turning from your back to your side while in a flat bed without using bedrails?: A Little Help needed moving from lying on your back to sitting on the side of a flat bed without using bedrails?: A Little Help needed moving to and from a bed to a chair (including a wheelchair)?: A Little Help needed standing up from a chair using your arms (e.g., wheelchair or bedside chair)?: A Little Help needed to walk in hospital room?: A Little Help needed climbing 3-5 steps with a railing? : A Lot 6 Click Score: 17    End of Session Equipment Utilized During Treatment: Gait belt Activity Tolerance: Patient tolerated treatment well Patient left: in chair;with call bell/phone within reach;with chair alarm set Nurse Communication: Mobility status PT Visit Diagnosis: Unsteadiness on feet (R26.81);Other abnormalities of gait and mobility (R26.89);Other symptoms and signs involving the nervous system (R29.898)     Time: 6789-3810 PT Time Calculation (min) (ACUTE ONLY): 22 min  Charges:  $Gait Training: 8-22 mins                     [name] Page, PT  Acute Rehab Services  Pager [phone] Office [phone]    [name] Page 09/16/2020, 4:18 PM

## 2020-09-16 NOTE — Plan of Care (Signed)
°  Problem: Education: Goal: Knowledge of General Education information will improve Description: Including pain rating scale, medication(s)/side effects and non-pharmacologic comfort measures Outcome: Progressing   Problem: Health Behavior/Discharge Planning: Goal: Ability to manage health-related needs will improve Outcome: Progressing   Problem: Clinical Measurements: Goal: Ability to maintain clinical measurements within normal limits will improve Outcome: Progressing Goal: Will remain free from infection Outcome: Progressing Goal: Diagnostic test results will improve Outcome: Progressing Goal: Respiratory complications will improve Outcome: Progressing Goal: Cardiovascular complication will be avoided Outcome: Progressing   Problem: Activity: Goal: Risk for activity intolerance will decrease Outcome: Progressing   Problem: Nutrition: Goal: Adequate nutrition will be maintained Outcome: Progressing   Problem: Coping: Goal: Level of anxiety will decrease Outcome: Progressing   Problem: Elimination: Goal: Will not experience complications related to bowel motility Outcome: Progressing Goal: Will not experience complications related to urinary retention Outcome: Progressing   Problem: Pain Managment: Goal: General experience of comfort will improve Outcome: Progressing   Problem: Safety: Goal: Ability to remain free from injury will improve Outcome: Progressing   Problem: Skin Integrity: Goal: Risk for impaired skin integrity will decrease Outcome: Progressing   Problem: Education: Goal: Knowledge of the prescribed therapeutic regimen will improve Outcome: Progressing   Problem: Activity: Goal: Ability to tolerate increased activity will improve Outcome: Progressing   Problem: Health Behavior/Discharge Planning: Goal: Identification of resources available to assist in meeting health care needs will improve Outcome: Progressing   Problem:  Nutrition: Goal: Maintenance of adequate nutrition will improve Outcome: Progressing   Problem: Clinical Measurements: Goal: Complications related to the disease process, condition or treatment will be avoided or minimized Outcome: Progressing   Problem: Respiratory: Goal: Will regain and/or maintain adequate ventilation Outcome: Progressing   Problem: Skin Integrity: Goal: Demonstration of wound healing without infection will improve Outcome: Progressing   

## 2020-09-16 NOTE — H&P (Signed)
Physical Medicine and Rehabilitation Admission H&P    Chief Complaint  Patient presents with  . 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  History taken from chart review and patient.  Patient lives alone in a mobile home with multiple steps to entry.  She has multiple family members that live close by that check on her routinely.  Reportedly independent prior to admission.  She presented on 09/04/2020 with progressive by lower extremity weakness as well as headaches x2 months.  Family also reporting episodes of forgetfulness and confusion.  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  There was a leftward midline shift with subfalcine herniation of the right cingulate gyrus with early entrapment of the left lateral ventricle.  CT renal study completed for some hematuria that showed mild renal pelvis dilatation without significant hydroureter secondary to 3 mm stone in the mid to distal right ureter at the level of the upper sacrum of which urology service Dr. Nolen Mu was consulted recommended course of antibiotics which she has since completed..  Patient underwent right pterional craniotomy for resection of anterior skull base mass on 09/11/2020 per Dr. Hoyt Koch.  Decadron protocol as indicated.  Keppra for seizure prophylaxis.  Subcutaneous Lovenox for DVT prophylaxis.  Physical medicine rehabilitation was consulted to assess candidacy for CIR for impaired mobility and ADLs.  She was admitted for a comprehensive rehab program.  Please agree admission assessment while it is well.  Review of Systems  Constitutional: Negative for chills and fever.  HENT: Negative for hearing loss.   Eyes: Negative for blurred vision and double vision.  Respiratory: Negative  for cough and shortness of breath.   Cardiovascular: Negative for chest pain and palpitations.  Gastrointestinal: Positive for constipation. Negative for heartburn, nausea and vomiting.  Genitourinary: Positive for dysuria and hematuria.  Musculoskeletal: Positive for joint pain and myalgias.  Skin: Negative for rash.  Neurological: Positive for weakness and headaches. Negative for sensory change and speech change.  Psychiatric/Behavioral: Positive for depression and memory loss.  All other systems reviewed and are negative.  Past Medical History:  Diagnosis Date  . Arthritis   . Basal cell carcinoma   . 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 Cancer (HCC)    melanoma  . Dysuria   . Frequency of urination   . Hematuria   . History of kidney stones   . Osteoporosis   . Renal calculus, bilateral   . 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 Urgency of urination   . Wears glasses    Past Surgical History:  Procedure Laterality Date  . APPLICATION OF CRANIAL NAVIGATION Right 09/11/2020   Procedure: APPLICATION OF CRANIAL NAVIGATION;  Surgeon: Bedelia Person, MD;  Location: [facility] OR;  Service: Neurosurgery;  Laterality: Right;  . CRANIOTOMY Right 09/11/2020   Procedure: Right Pterional Craniotomy for resection of anterior skull base mass with Brain Lab;  Surgeon: Bedelia Person, MD;  Location: [facility] OR;  Service: Neurosurgery;  Laterality: Right;  . CYSTOSCOPY WITH RETROGRADE PYELOGRAM, URETEROSCOPY AND STENT PLACEMENT Bilateral 08/11/2013   Procedure: CYSTOSCOPY WITH RETROGRADE PYELOGRAM, URETEROSCOPY AND STENT PLACEMENT;  Surgeon: Sebastian Ache, MD;  Location: [facility] ORS;  Service: Urology;  Laterality:  Bilateral;  . CYSTOSCOPY WITH URETEROSCOPY AND STENT PLACEMENT Bilateral 08/29/2013   Procedure: CYSTOSCOPY WITH BILATERAL URETEROSCOPY AND STENT REPLACEMENTS, stone extraction;  Surgeon: Alexis Frock, MD;  Location: [facility];   Service: Urology;  Laterality: Bilateral;  . CYSTOSCOPY/RETROGRADE/URETEROSCOPY/STONE EXTRACTION WITH BASKET Bilateral 01/31/2016   Procedure: CYSTOSCOPY/BILATERAL RETROGRADE PYELOGRAMS/BILATERAL URETEROSCOPY/BILATERAL STONE EXTRACTION WITH BASKET/RIGHT URETERAL STENT PLACEMENT;  Surgeon: Raynelle Bring, MD;  Location: [facility] ORS;  Service: Urology;  Laterality: Bilateral;  . HEMIARTHROPLASTY SHOULDER FRACTURE Left 1992  . HOLMIUM LASER APPLICATION Bilateral [id number]   Procedure: HOLMIUM LASER APPLICATION;  Surgeon: Alexis Frock, MD;  Location: [facility];  Service: Urology;  Laterality: Bilateral;   Family History  Problem Relation Age of Onset  . Cancer Mother        unknown source, stomach and liver   . Atrial fibrillation Father   . Hyperlipidemia Father   . Hypertension Father   . Colon polyps Father   . Hypertension Sister   . Hyperlipidemia Sister   . Thyroid disease Sister   . Esophageal cancer Neg Hx   . Pancreatic cancer Neg Hx    Social History:  reports that she has never smoked. She has never used smokeless tobacco. She reports that she does not drink alcohol and does not use drugs. Allergies:  Allergies  Allergen Reactions  . Sulfa Antibiotics Rash   Medications Prior to Admission  Medication Sig Dispense Refill  . alendronate (FOSAMAX) 70 MG tablet Take 1 tablet (70 mg total) by mouth every 7 (seven) days. Reported on 10/30/2015 (Needs to be seen before next refill) 4 tablet 0  . atorvastatin (LIPITOR) 20 MG tablet TAKE 1 TABLET BY MOUTH EVERY DAY 30 tablet 0  . Calcium Carbonate-Vit D-Min (CALCIUM 1200 PO) Take 1 tablet by mouth daily.    Marland Kitchen escitalopram (LEXAPRO) 10 MG tablet TAKE 1 TABLET BY MOUTH EVERY DAY 90 tablet 1  . ferrous sulfate 325 (65 FE) MG tablet Take 325 mg by mouth daily with breakfast.    . Glycerin-Hypromellose-PEG 400 0.2-0.2-1 % SOLN Apply 1 drop to eye 2 (two) times daily as needed (dry eyes).     Marland Kitchen ibuprofen (ADVIL,MOTRIN) 200 MG  tablet Take 200-400 mg by mouth every 6 (six) hours as needed for moderate pain.    Marland Kitchen loratadine (CLARITIN) 10 MG tablet Take 10 mg by mouth daily as needed for allergies.    . Multiple Vitamin (MULTIVITAMIN WITH MINERALS) TABS tablet Take 1 tablet by mouth daily.      Drug Regimen Review Drug regimen was reviewed and remains appropriate with no significant issues identified  Home: Home Living Family/patient expects to be discharged to:: Private residence Living Arrangements: Alone Available Help at Discharge: Family,Available 24 hours/day Type of Home: Mobile home Home Access: Stairs to enter Entrance Stairs-Number of Steps: 9 Entrance Stairs-Rails: Right,Left Bathroom Shower/Tub: Multimedia programmer: Handicapped height Home Equipment: None Additional Comments: Many family member live adjacent to her, sister does not work and can assist  Lives With: Alone   Functional History: Prior Function Level of Independence: Independent Comments: drove and completed her ADL's PTA  Functional Status:  Mobility: Bed Mobility Overal bed mobility: Needs Assistance Bed Mobility: Supine to Sit Supine to sit: Min guard General bed mobility comments: Min guard for safety +rail with HOB flat to simulate home environment. Transfers Overall transfer level: Needs assistance Equipment used: Rolling walker (2 wheeled) Transfers: Sit to/from Stand Sit to Stand: Min guard,Min assist Stand pivot transfers: Mod  assist,+2 safety/equipment,Min assist,+2 physical assistance General transfer comment: Min guard from elevated surfaces and Min A from low surfaces. Cues for hand placement. Ambulation/Gait Ambulation/Gait assistance: Min assist Gait Distance (Feet): 40 Feet Assistive device: Rolling walker (2 wheeled) Gait Pattern/deviations: Trunk flexed,Decreased stride length General Gait Details: vc's for posture, unsteady gait, narrow BOS, decreased pace, has difficulty with navigation  around obstacles Gait velocity: decreased Gait velocity interpretation: <1.31 ft/sec, indicative of household ambulator    ADL: ADL Overall ADL's : Needs assistance/impaired Eating/Feeding: Set up,Sitting Grooming: Wash/dry hands,Wash/dry face,Oral care,Brushing hair,Min guard,Standing Grooming Details (indicate cue type and reason): Patient completed 3/3 grooming tasks standing at sink level with use of RW. Min guard for safety. Upper Body Bathing: Min guard,Sitting Lower Body Bathing: Maximal assistance,Sit to/from stand Upper Body Dressing : Minimal assistance,Sitting Lower Body Dressing: Moderate assistance,Sit to/from stand Toilet Transfer: Min Copy Details (indicate cue type and reason): Min guard for safety with use of RW. Good safety awareness. Toileting- Water quality scientist and Hygiene: Min guard,Sit to/from stand Toileting - Water quality scientist Details (indicate cue type and reason): Patient able to complete 3/3 parts of toileting tasks in sitting/standing with Min guard for safety. Functional mobility during ADLs: Min guard,Rolling walker General ADL Comments: pt noted to have drainage and odor on posterior back bandage. pt with new bandage applied this session. pt and daughter thanking staff  Cognition: Cognition Overall Cognitive Status: Within Functional Limits for tasks assessed Arousal/Alertness: Awake/alert Orientation Level: Oriented X4 Attention: Sustained Sustained Attention: Appears intact Memory: Impaired Memory Impairment: Retrieval deficit (4/5) Awareness: Impaired Awareness Impairment: Intellectual impairment Problem Solving: Impaired Problem Solving Impairment: Verbal basic Executive Function: Self Monitoring,Self Correcting Safety/Judgment: Impaired Cognition Arousal/Alertness: Awake/alert Behavior During Therapy: WFL for tasks assessed/performed Overall Cognitive Status: Within Functional Limits for tasks assessed Area of  Impairment: Attention,Problem solving Current Attention Level: Alternating Problem Solving: Requires verbal cues,Difficulty sequencing,Slow processing General Comments: delayed processing, STM deficits  Physical Exam: Blood pressure (!) 145/75, pulse (!) 59, temperature 98 F (36.7 C), temperature source Oral, resp. rate 16, height 5\' 2"  (1.575 m), weight 63.9 kg, SpO2 98 %. Physical Exam Vitals reviewed.  Constitutional:      General: She is not in acute distress.    Appearance: She is obese.  HENT:     Head:     Comments: Right craniotomy site clean and dry    Right Ear: External ear normal.     Left Ear: External ear normal.     Nose: Nose normal.  Eyes:     General:        Right eye: No discharge.        Left eye: No discharge.     Extraocular Movements: Extraocular movements intact.  Cardiovascular:     Rate and Rhythm: Normal rate and regular rhythm.  Pulmonary:     Effort: Pulmonary effort is normal. No respiratory distress.     Breath sounds: No stridor.  Abdominal:     General: Abdomen is flat. Bowel sounds are normal. There is no distension.  Musculoskeletal:     Cervical back: Normal range of motion and neck supple.     Comments: Bilateral lower extremity edema No tenderness in extremities  Skin:    General: Skin is warm and dry.     Comments: Staples to right craniotomy CDI  Neurological:     Mental Status: She is alert.     Comments: Alert  Makes eye contact with examiner.   Provides her name and age  with some mild delay in processing.   Follows commands. Motor: RUE: 4+/5 proximal distal RLE: 4/5 proximal distally in LUE: Shoulder abduction 2+/5 (baseline), distally 4/5 Left lower extremity: 4/5 proximal distal  Psychiatric:        Mood and Affect: Mood normal.        Behavior: Behavior normal.     Results for orders placed or performed during the hospital encounter of 09/04/20 (from the past 48 hour(s))  Basic metabolic panel     Status: Abnormal    Collection Time: 09/16/20  4:34 AM  Result Value Ref Range   Sodium 137 135 - 145 mmol/L   Potassium 4.0 3.5 - 5.1 mmol/L   Chloride 111 98 - 111 mmol/L   CO2 19 (L) 22 - 32 mmol/L   Glucose, Bld 110 (H) 70 - 99 mg/dL    Comment: Glucose reference range applies only to samples taken after fasting for at least 8 hours.   BUN 22 8 - 23 mg/dL   Creatinine, Ser 0.93 0.44 - 1.00 mg/dL   Calcium 7.3 (L) 8.9 - 10.3 mg/dL   GFR, Estimated >60 >60 mL/min    Comment: (NOTE) Calculated using the CKD-EPI Creatinine Equation (2021)    Anion gap 7 5 - 15    Comment: Performed at [facility] [address] [place], [place] [address]  CBC     Status: Abnormal   Collection Time: 09/16/20  4:34 AM  Result Value Ref Range   WBC 8.6 4.0 - 10.5 K/uL   RBC 3.43 (L) 3.87 - 5.11 MIL/uL   Hemoglobin 8.5 (L) 12.0 - 15.0 g/dL   HCT 27.3 (L) 36.0 - 46.0 %   MCV 79.6 (L) 80.0 - 100.0 fL   MCH 24.8 (L) 26.0 - 34.0 pg   MCHC 31.1 30.0 - 36.0 g/dL   RDW 21.2 (H) 11.5 - 15.5 %   Platelets 146 (L) 150 - 400 K/uL    Comment: REPEATED TO VERIFY   nRBC 0.0 0.0 - 0.2 %    Comment: Performed at [facility], [place] [address], [place], [place] [address]   No results found.     Medical Problem List and Plan: 1.  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  Follow-up Dr. Duffy Rhody.  Decadron protocol as indicated  -patient may not shower  -ELOS/Goals: 10-15 days/supervision/mod I  Admit to CIR 2.  Antithrombotics: -DVT/anticoagulation: Lovenox  -antiplatelet therapy: N/A 3. Pain Management: Hydrocodone as needed  Monitor, particularly for headaches with increased exertion 4. Mood: Lexapro 10 mg daily  -antipsychotic agents: N/A 5. Neuropsych: This patient is capable of making decisions on her own behalf. 6. Skin/Wound Care: Routine skin  checks 7. Fluids/Electrolytes/Nutrition: Routine in and outs  CMP ordered for tomorrow a.m.  8.  Seizure prophylaxis.  Keppra 500 mg twice daily 9.  Hypertension.  Hydralazine 10 mg every 8 hours.   Monitor increase mobility 10.  History of renal stones.  Follow-up outpatient urology services 11.  Hyperlipidemia: Lipitor 12.  Constipation.  Colace 100 mg twice daily, MiraLAX as needed 13.  Iron deficiency anemia.  Continue iron supplement  CBC ordered for tomorrow a.m.  Lavon Paganini Angiulli, PA-C 09/17/2020   I have personally performed a face to face diagnostic evaluation, including, but not limited to relevant history and physical exam findings, of this patient and developed  relevant assessment and plan.  Additionally, I have reviewed and concur with the physician assistant's documentation above.  Delice Lesch, MD, ABPMR

## 2020-09-16 NOTE — Progress Notes (Signed)
   Providing Compassionate, Quality Care - Together  NEUROSURGERY PROGRESS NOTE   S: No issues overnight.   O: EXAM:  BP (!) 164/73 (BP Location: Left Arm)   Pulse 69   Temp 97.8 F (36.6 C) (Oral)   Resp 17   Ht 5\' 2"  (1.575 m)   Wt 63.9 kg   SpO2 97%   BMI 25.77 kg/m   Awake, alert, oriented  PERRLA EOMI Speech fluent, appropriate  CNs grossly intact  5/5 BUE/BLE  Incision c/d/i  ASSESSMENT:  69 y.o. female with  S/p resection ant skull base mass   PLAN: - rehab pending -pt/ot -scds -decadron taper -doing well    Thank you for allowing me to participate in this patient's care.  Please do not hesitate to call with questions or concerns.   65, DO Neurosurgeon [facility] Cell: [phone]

## 2020-09-16 NOTE — Plan of Care (Signed)
  Problem: Education: Goal: Knowledge of General Education information will improve Description: Including pain rating scale, medication(s)/side effects and non-pharmacologic comfort measures Outcome: Progressing   Problem: Health Behavior/Discharge Planning: Goal: Ability to manage health-related needs will improve Outcome: Progressing   Problem: Clinical Measurements: Goal: Ability to maintain clinical measurements within normal limits will improve Outcome: Progressing Goal: Will remain free from infection Outcome: Progressing Goal: Diagnostic test results will improve Outcome: Progressing Goal: Respiratory complications will improve Outcome: Progressing Goal: Cardiovascular complication will be avoided Outcome: Progressing   Problem: Activity: Goal: Risk for activity intolerance will decrease Outcome: Progressing   Problem: Nutrition: Goal: Adequate nutrition will be maintained Outcome: Progressing   Problem: Coping: Goal: Level of anxiety will decrease Outcome: Progressing   Problem: Elimination: Goal: Will not experience complications related to bowel motility Outcome: Progressing Goal: Will not experience complications related to urinary retention Outcome: Progressing   Problem: Pain Managment: Goal: General experience of comfort will improve Outcome: Progressing   Problem: Safety: Goal: Ability to remain free from injury will improve Outcome: Progressing   Problem: Skin Integrity: Goal: Risk for impaired skin integrity will decrease Outcome: Progressing   Problem: Education: Goal: Knowledge of the prescribed therapeutic regimen will improve Outcome: Progressing   Problem: Activity: Goal: Ability to tolerate increased activity will improve Outcome: Progressing   Problem: Health Behavior/Discharge Planning: Goal: Identification of resources available to assist in meeting health care needs will improve Outcome: Progressing   Problem:  Nutrition: Goal: Maintenance of adequate nutrition will improve Outcome: Progressing   Problem: Clinical Measurements: Goal: Complications related to the disease process, condition or treatment will be avoided or minimized Outcome: Progressing   Problem: Respiratory: Goal: Will regain and/or maintain adequate ventilation Outcome: Progressing   Problem: Skin Integrity: Goal: Demonstration of wound healing without infection will improve Outcome: Progressing   

## 2020-09-16 NOTE — Progress Notes (Signed)
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  She was recently started on iron supplements for anemia.  Apparently she was scheduled for EGD and colonoscopy on 09/05/20.  Her daughter reports she has been confused, forgetful and been complaining about headache recently, Patient reports intermittent headache for almost a year which has recently gotten worse.  Head CT showed anterior cranial fossa meningioma causing severe vasogenic edema within the right frontal lobe, leftward midline shift with subfalcine herniation of the right cingulate gyrus with early entrapment of the left lateral ventricle.  Neurosurgery was consulted by ED physician who recommended to initiate Decadron 10 mg daily and patient underwent meningioma removal on 12/23 without major complications.    She is also found to have a 3 mm stone without significant hydronephrosis,  Dr. Nolen Mu, urologist was consulted,  recommended course of antibiotics which she completed.    Patient doing okay, about the same.  No complaints.  Eye improved.  Accepted by physical medicine rehab and waiting for bed.  Assessment/Plan: Principal Problem:   Meningioma [facility]): Looks to be the cause of her intermittent headaches for the past year.  MRI and CT noted large meningioma causing mass-effect.  Started on IV dexamethasone and Keppra.  Status post resection of mass on 12/23 by neurosurgery.  Stable and now inpatient rehab consult pending.  Initial swallowing issues post surgery have resolved.  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  Looks to be resolved.     Cancer of skin, squamous cell: Squamous cell of skin of chest, right breast and lower back.  Initially had been seeing a dermatologist, but since then had stopped following up.  Daughter was unaware lesions had grown in size.  Discussed with oncology who recommend outpatient follow-up with dermatology upon discharge.  Dermatology does not come into the hospital.   Stage IIIa chronic kidney disease: Looks to be at baseline  Covid prevention: Patient requested [place] and booster.  This has been ordered.    UTI (urinary tract infection): Urinalysis with moderate leukocytosis and CT of abdomen bilateral renal stones, but none obstruction without significant hydroureter.  Completed 3-day course of IV Rocephin.  Insignificant growth in culture.  Discussed with urology  Iron deficiency anemia: Recently found to be anemic and started on iron supplement.  Was supposed to have EGD and colonoscopy done on 12/17.  Hemoglobin down to 8, but since then has come up on its own.  Plan is to reschedule outpatient scope although may need inpatient consultation if hemoglobin has dropped.  We will continue to follow  Right eye irritation: Some erythema noted on 12/26.  Much improved  Code Status: Partial code  Family Communication: Updated daughter by phone  Disposition Plan: Inpatient rehab, once bed available   Consultants:  Neurosurgery  Urology  Procedures:  meningioma resection 12/23  Antimicrobials:  IV Rocephin 12/16-12/19  DVT prophylaxis: SCDs   Objective: Vitals:   09/16/20 1000 09/16/20 1155  BP: (!) 149/69 139/73  Pulse: 64 64  Resp:  18  Temp:  97.9 F (36.6 C)  SpO2:  97%    Intake/Output Summary (Last 24 hours) at 09/16/2020 1332 Last data filed at 09/16/2020 0535 Gross per 24 hour  Intake 1775.36 ml  Output --  Net 1775.36 ml    Filed Weights   09/07/20 0433  Weight: 63.9 kg   Body mass index is 25.77 kg/m.  Exam:   General: Alert and oriented x2, no acute distress  HEENT: Status post right peroneal craniotomy-incision clean dry intact, mucous membranes moist.  Right eye-redness resolved  Cardiovascular: Regular rate and rhythm, S1-S2  Lungs: Clear auscultation bilaterally  Abdomen: Soft, nontender, nondistended, hypoactive bowel sounds  Extremities: No clubbing or cyanosis, or edema  Neuro: Essential tremor  Psychiatry: Patient is appropriate, no evidence of psychoses   Data Reviewed: CBC: Recent Labs  Lab 09/11/20 1409 09/11/20 1503 09/12/20 0534 09/14/20 0242 09/16/20 0434  WBC  --   --  11.3* 8.8 8.6  NEUTROABS  --   --  9.9*  --   --   HGB 9.5* 8.8* 9.6* 8.6* 8.5*  HCT 28.0* 26.0* 31.4* 28.6* 27.3*  MCV  --   --  80.1 79.7* 79.6*  PLT  --   --  153 137* 146*   Basic Metabolic Panel: Recent Labs  Lab 09/10/20 0246 09/11/20 0958 09/11/20 1409 09/11/20 1503 09/12/20 0534 09/14/20 0242 09/16/20 0434  NA 136   < > 138 139 139 137 137  K 4.4   < > 4.4 4.4 4.6 4.4 4.0  CL 105  --   --   --  112* 109 111  CO2 23  --   --   --  21* 20* 19*  GLUCOSE 133*  --   --   --  123* 142* 110*  BUN 30*  --   --   --  30* 25* 22  CREATININE 1.14*  --   --   --  1.15* 0.90 0.93  CALCIUM 8.3*  --   --   --  7.0* 7.2* 7.3*   < > = values in this interval not displayed.   GFR: Estimated Creatinine Clearance: 49.4 mL/min (by C-G formula based on SCr of 0.93 mg/dL). Liver Function Tests: No results for input(s): AST, ALT, ALKPHOS, BILITOT, PROT, ALBUMIN in the last 168 hours. No results for input(s): LIPASE, AMYLASE in the last 168 hours. No results for input(s): AMMONIA in the last 168 hours. Coagulation Profile: No results for input(s): INR, PROTIME in the last 168 hours. Cardiac Enzymes: No results for input(s): CKTOTAL, CKMB, CKMBINDEX, TROPONINI in the last 168 hours. BNP (last 3  results) No results for input(s): PROBNP in the last 8760 hours. HbA1C: No results for input(s): HGBA1C in the last 72 hours. CBG: No results for input(s): GLUCAP in the last 168 hours. Lipid Profile: No results for input(s): CHOL, HDL, LDLCALC, TRIG, CHOLHDL, LDLDIRECT in the last 72 hours. Thyroid Function Tests: No results for input(s): TSH, T4TOTAL, FREET4, T3FREE, THYROIDAB in the last 72 hours. Anemia Panel: No results for input(s): VITAMINB12, FOLATE, FERRITIN, TIBC, IRON, RETICCTPCT in the last 72 hours. Urine analysis:    Component Value Date/Time   COLORURINE YELLOW 09/04/2020 1659   APPEARANCEUR HAZY (A) 09/04/2020 1659   LABSPEC 1.016 09/04/2020 1659   PHURINE 6.0 09/04/2020 1659   GLUCOSEU NEGATIVE 09/04/2020 1659   HGBUR LARGE (A) 09/04/2020 1659   BILIRUBINUR NEGATIVE 09/04/2020 1659   KETONESUR 20 (A) 09/04/2020 1659   PROTEINUR 100 (A) 09/04/2020 1659  UROBILINOGEN 0.2 07/04/2014 0406   NITRITE NEGATIVE 09/04/2020 1659   LEUKOCYTESUR MODERATE (A) 09/04/2020 1659   Sepsis Labs: @LABRCNTIP (procalcitonin:4,lacticidven:4)  ) Recent Results (from the past 240 hour(s))  MRSA PCR Screening     Status: None   Collection Time: 09/10/20  5:18 PM   Specimen: Nasal Mucosa; Nasopharyngeal  Result Value Ref Range Status   MRSA by PCR NEGATIVE NEGATIVE Final    Comment:        The GeneXpert MRSA Assay (FDA approved for NASAL specimens only), is one component of a comprehensive MRSA colonization surveillance program. It is not intended to diagnose MRSA infection nor to guide or monitor treatment for MRSA infections. Performed at [facility], [place] [address], [place], [place] [address]       Studies: No results found.  Scheduled Meds: . atorvastatin  20 mg Oral Daily  . bacitracin  1 application Topical Daily  . Chlorhexidine Gluconate Cloth  6 each Topical Daily  . COVID-19 mRNA vaccine (Moderna)  0.25 mL Intramuscular Once  . dexamethasone  2 mg  Oral Q8H   Followed by  . [START ON 09/18/2020] dexamethasone  2 mg Oral Q12H   Followed by  . [START ON 09/21/2020] dexamethasone  1 mg Oral Q12H   Followed by  . [START ON 09/24/2020] dexamethasone  1 mg Oral Daily  . docusate  100 mg Oral BID  . enoxaparin (LOVENOX) injection  40 mg Subcutaneous Q24H  . escitalopram  10 mg Oral Daily  . ferrous sulfate  325 mg Oral Q breakfast  . hydrALAZINE  25 mg Oral Q8H  . levETIRAcetam  500 mg Oral BID  . naphazoline-glycerin  2 drop Right Eye TID  . pantoprazole  40 mg Oral Daily    Continuous Infusions: . sodium chloride 75 mL/hr at 09/16/20 0536     LOS: 12 days     Annita Brod, MD [facility]   09/16/2020, 1:32 PM

## 2020-09-16 NOTE — Progress Notes (Signed)
Inpatient Rehabilitation-Admissions Coordinator   Following up for my coworker, Estill Dooms. I do not have a bed available for this patient in CIR today. Will follow up tomorrow for possible admit, pending bed availability.   Cheri Rous, OTR/L  Rehab Admissions Coordinator  [phone] 09/16/2020 1:28 PM

## 2020-09-17 ENCOUNTER — Encounter (HOSPITAL_COMMUNITY): Payer: Self-pay | Admitting: Physical Medicine & Rehabilitation

## 2020-09-17 ENCOUNTER — Other Ambulatory Visit: Payer: Self-pay

## 2020-09-17 ENCOUNTER — Inpatient Hospital Stay (HOSPITAL_COMMUNITY)
Admission: RE | Admit: 2020-09-17 | Discharge: 2020-09-25 | DRG: 092 | Disposition: A | Discharge status: Home / Self Care | Payer: Medicare Other | Source: Intra-hospital | Attending: Physical Medicine & Rehabilitation | Admitting: Physical Medicine & Rehabilitation

## 2020-09-17 DIAGNOSIS — R531 Weakness: Secondary | ICD-10-CM | POA: Diagnosis present

## 2020-09-17 DIAGNOSIS — D329 Benign neoplasm of meninges, unspecified: Secondary | ICD-10-CM | POA: Diagnosis present

## 2020-09-17 DIAGNOSIS — D509 Iron deficiency anemia, unspecified: Secondary | ICD-10-CM | POA: Diagnosis present

## 2020-09-17 DIAGNOSIS — F32A Depression, unspecified: Secondary | ICD-10-CM | POA: Diagnosis present

## 2020-09-17 DIAGNOSIS — R319 Hematuria, unspecified: Secondary | ICD-10-CM | POA: Diagnosis present

## 2020-09-17 DIAGNOSIS — R269 Unspecified abnormalities of gait and mobility: Principal | ICD-10-CM | POA: Diagnosis present

## 2020-09-17 DIAGNOSIS — R7401 Elevation of levels of liver transaminase levels: Secondary | ICD-10-CM

## 2020-09-17 DIAGNOSIS — N183 Chronic kidney disease, stage 3 unspecified: Secondary | ICD-10-CM | POA: Diagnosis present

## 2020-09-17 DIAGNOSIS — K5900 Constipation, unspecified: Secondary | ICD-10-CM | POA: Diagnosis present

## 2020-09-17 DIAGNOSIS — Z87442 Personal history of urinary calculi: Secondary | ICD-10-CM | POA: Diagnosis not present

## 2020-09-17 DIAGNOSIS — G9389 Other specified disorders of brain: Secondary | ICD-10-CM

## 2020-09-17 DIAGNOSIS — R251 Tremor, unspecified: Secondary | ICD-10-CM | POA: Diagnosis present

## 2020-09-17 DIAGNOSIS — N3001 Acute cystitis with hematuria: Secondary | ICD-10-CM | POA: Diagnosis not present

## 2020-09-17 DIAGNOSIS — R001 Bradycardia, unspecified: Secondary | ICD-10-CM | POA: Diagnosis present

## 2020-09-17 DIAGNOSIS — E46 Unspecified protein-calorie malnutrition: Secondary | ICD-10-CM | POA: Diagnosis present

## 2020-09-17 DIAGNOSIS — Z7983 Long term (current) use of bisphosphonates: Secondary | ICD-10-CM

## 2020-09-17 DIAGNOSIS — Z96612 Presence of left artificial shoulder joint: Secondary | ICD-10-CM | POA: Diagnosis present

## 2020-09-17 DIAGNOSIS — N179 Acute kidney failure, unspecified: Secondary | ICD-10-CM | POA: Diagnosis present

## 2020-09-17 DIAGNOSIS — R600 Localized edema: Secondary | ICD-10-CM | POA: Diagnosis present

## 2020-09-17 DIAGNOSIS — R519 Headache, unspecified: Secondary | ICD-10-CM | POA: Diagnosis present

## 2020-09-17 DIAGNOSIS — C4441 Basal cell carcinoma of skin of scalp and neck: Secondary | ICD-10-CM | POA: Diagnosis present

## 2020-09-17 DIAGNOSIS — R609 Edema, unspecified: Secondary | ICD-10-CM | POA: Diagnosis not present

## 2020-09-17 DIAGNOSIS — Z79899 Other long term (current) drug therapy: Secondary | ICD-10-CM | POA: Diagnosis not present

## 2020-09-17 DIAGNOSIS — E8809 Other disorders of plasma-protein metabolism, not elsewhere classified: Secondary | ICD-10-CM

## 2020-09-17 DIAGNOSIS — Z6829 Body mass index (BMI) 29.0-29.9, adult: Secondary | ICD-10-CM

## 2020-09-17 DIAGNOSIS — M1990 Unspecified osteoarthritis, unspecified site: Secondary | ICD-10-CM | POA: Diagnosis present

## 2020-09-17 DIAGNOSIS — M810 Age-related osteoporosis without current pathological fracture: Secondary | ICD-10-CM | POA: Diagnosis present

## 2020-09-17 DIAGNOSIS — Z8249 Family history of ischemic heart disease and other diseases of the circulatory system: Secondary | ICD-10-CM | POA: Diagnosis not present

## 2020-09-17 DIAGNOSIS — I129 Hypertensive chronic kidney disease with stage 1 through stage 4 chronic kidney disease, or unspecified chronic kidney disease: Secondary | ICD-10-CM | POA: Diagnosis present

## 2020-09-17 DIAGNOSIS — I10 Essential (primary) hypertension: Secondary | ICD-10-CM | POA: Diagnosis not present

## 2020-09-17 DIAGNOSIS — Z882 Allergy status to sulfonamides status: Secondary | ICD-10-CM

## 2020-09-17 DIAGNOSIS — N1831 Chronic kidney disease, stage 3a: Secondary | ICD-10-CM | POA: Diagnosis not present

## 2020-09-17 DIAGNOSIS — Z298 Encounter for other specified prophylactic measures: Secondary | ICD-10-CM

## 2020-09-17 DIAGNOSIS — E785 Hyperlipidemia, unspecified: Secondary | ICD-10-CM | POA: Diagnosis present

## 2020-09-17 DIAGNOSIS — E782 Mixed hyperlipidemia: Secondary | ICD-10-CM

## 2020-09-17 MED ORDER — ENOXAPARIN SODIUM 40 MG/0.4ML ~~LOC~~ SOLN: 40.0000 mg | SUBCUTANEOUS | Status: DC

## 2020-09-17 MED ORDER — POLYETHYLENE GLYCOL 3350 17 G PO PACK: 17.0000 g | PACK | Freq: Every day | ORAL | Status: DC | PRN

## 2020-09-17 MED ORDER — DEXAMETHASONE 2 MG PO TABS: 1.0000 mg | ORAL_TABLET | Freq: Every day | ORAL | Status: DC

## 2020-09-17 MED ORDER — ACETAMINOPHEN 650 MG RE SUPP: 650.0000 mg | Freq: Four times a day (QID) | RECTAL | Status: DC | PRN

## 2020-09-17 MED ORDER — ONDANSETRON HCL 4 MG/2ML IJ SOLN: 4.0000 mg | Freq: Four times a day (QID) | INTRAMUSCULAR | Status: DC | PRN

## 2020-09-17 MED ORDER — HYDROCODONE-ACETAMINOPHEN 5-325 MG PO TABS
1.0000 | ORAL_TABLET | ORAL | Status: DC | PRN
Start: 2020-09-17 — End: 2020-09-25
  Filled 2020-09-17: qty 1

## 2020-09-17 MED ORDER — ACETAMINOPHEN 325 MG PO TABS: 650.0000 mg | ORAL_TABLET | Freq: Four times a day (QID) | ORAL | Status: DC | PRN

## 2020-09-17 MED ORDER — LEVETIRACETAM 500 MG PO TABS
500.0000 mg | ORAL_TABLET | Freq: Two times a day (BID) | ORAL | Status: DC
  Administered 2020-09-17 – 2020-09-18 (×2): 500 mg via ORAL
  Filled 2020-09-17 (×4): qty 1

## 2020-09-17 MED ORDER — PANTOPRAZOLE SODIUM 40 MG PO TBEC
40.0000 mg | DELAYED_RELEASE_TABLET | Freq: Every day | ORAL | Status: DC
  Administered 2020-09-18 – 2020-09-25 (×8): 40 mg via ORAL
  Filled 2020-09-17 (×8): qty 1

## 2020-09-17 MED ORDER — DEXAMETHASONE 2 MG PO TABS
1.0000 mg | ORAL_TABLET | Freq: Two times a day (BID) | ORAL | Status: AC
  Administered 2020-09-22 – 2020-09-25 (×6): 1 mg via ORAL
  Filled 2020-09-17 (×7): qty 1

## 2020-09-17 MED ORDER — POLYVINYL ALCOHOL 1.4 % OP SOLN
1.0000 [drp] | Freq: Two times a day (BID) | OPHTHALMIC | Status: DC | PRN
  Administered 2020-09-18 – 2020-09-22 (×2): 1 [drp] via OPHTHALMIC
  Filled 2020-09-17: qty 15

## 2020-09-17 MED ORDER — ESCITALOPRAM OXALATE 10 MG PO TABS
10.0000 mg | ORAL_TABLET | Freq: Every day | ORAL | Status: DC
  Administered 2020-09-18: 08:00:00 10 mg via ORAL
  Filled 2020-09-17: qty 1

## 2020-09-17 MED ORDER — ENOXAPARIN SODIUM 40 MG/0.4ML ~~LOC~~ SOLN
40.0000 mg | SUBCUTANEOUS | Status: DC
  Administered 2020-09-18 – 2020-09-24 (×7): 40 mg via SUBCUTANEOUS
  Filled 2020-09-17 (×7): qty 0.4

## 2020-09-17 MED ORDER — FERROUS SULFATE 325 (65 FE) MG PO TABS
325.0000 mg | ORAL_TABLET | Freq: Every day | ORAL | Status: DC
  Administered 2020-09-18 – 2020-09-25 (×8): 325 mg via ORAL
  Filled 2020-09-17 (×8): qty 1

## 2020-09-17 MED ORDER — DEXAMETHASONE 2 MG PO TABS
2.0000 mg | ORAL_TABLET | Freq: Two times a day (BID) | ORAL | Status: AC
  Administered 2020-09-19 – 2020-09-22 (×6): 2 mg via ORAL
  Filled 2020-09-17 (×7): qty 1

## 2020-09-17 MED ORDER — ATORVASTATIN CALCIUM 10 MG PO TABS
20.0000 mg | ORAL_TABLET | Freq: Every day | ORAL | Status: DC
  Administered 2020-09-18 – 2020-09-25 (×8): 20 mg via ORAL
  Filled 2020-09-17 (×8): qty 2

## 2020-09-17 MED ORDER — BACITRACIN ZINC 500 UNIT/GM EX OINT
1.0000 "application " | TOPICAL_OINTMENT | Freq: Every day | CUTANEOUS | Status: DC
  Administered 2020-09-18 – 2020-09-25 (×7): 1 via TOPICAL
  Filled 2020-09-17 (×2): qty 28.35

## 2020-09-17 MED ORDER — HYDRALAZINE HCL 25 MG PO TABS
25.0000 mg | ORAL_TABLET | Freq: Three times a day (TID) | ORAL | Status: DC
  Administered 2020-09-17 – 2020-09-18 (×2): 25 mg via ORAL
  Filled 2020-09-17 (×2): qty 1

## 2020-09-17 MED ORDER — DOCUSATE SODIUM 50 MG/5ML PO LIQD
100.0000 mg | Freq: Two times a day (BID) | ORAL | Status: DC
  Administered 2020-09-23: 100 mg via ORAL
  Filled 2020-09-17 (×15): qty 10

## 2020-09-17 MED ORDER — CALCIUM CARBONATE-VITAMIN D 500-200 MG-UNIT PO TABS
1.0000 | ORAL_TABLET | Freq: Every day | ORAL | Status: DC
  Administered 2020-09-18 – 2020-09-25 (×8): 1 via ORAL
  Filled 2020-09-17 (×8): qty 1

## 2020-09-17 MED ORDER — DEXAMETHASONE 2 MG PO TABS
2.0000 mg | ORAL_TABLET | Freq: Three times a day (TID) | ORAL | Status: AC
  Administered 2020-09-17 – 2020-09-19 (×5): 2 mg via ORAL
  Filled 2020-09-17 (×5): qty 1

## 2020-09-17 MED ORDER — NAPHAZOLINE-GLYCERIN 0.012-0.2 % OP SOLN
2.0000 [drp] | Freq: Three times a day (TID) | OPHTHALMIC | Status: DC
  Administered 2020-09-17 – 2020-09-25 (×23): 2 [drp] via OPHTHALMIC
  Filled 2020-09-17: qty 15

## 2020-09-17 MED ORDER — ONDANSETRON HCL 4 MG PO TABS: 4.0000 mg | ORAL_TABLET | Freq: Four times a day (QID) | ORAL | Status: DC | PRN

## 2020-09-17 NOTE — Progress Notes (Signed)
Inpatient Rehabilitation Medication Review by a Pharmacist  A complete drug regimen review was completed for this patient to identify any potential clinically significant medication issues.  Clinically significant medication issues were identified:  no   Type of Medication Issue Identified Description of Issue Urgent (address now) Non-Urgent (address on AM team rounds) Plan Plan Accepted by Provider? (Yes / No / Pending AM Rounds)  Drug Interaction(s) (clinically significant)       Duplicate Therapy       Allergy       No Medication Administration End Date       Incorrect Dose       Additional Drug Therapy Needed       Other  Ca +D from PTA is the only home med not resumed  Resume Ca + D     Name of provider notified for urgent issues identified:   Provider Method of Notification:    For non-urgent medication issues to be resolved on team rounds tomorrow morning a [facility] Secure Chat Handoff was sent to:    Pharmacist comments: Ca +D from PTA is the only home med not resumed  Time spent performing this drug regimen review (minutes):  5 min  Sameera Betton S. Merilynn Finland, PharmD, BCPS Clinical Staff Pharmacist [email or web address] Pasty Spillers 09/17/2020 4:24 PM

## 2020-09-17 NOTE — Progress Notes (Signed)
Occupational Therapy Treatment Patient Details Name: [name] Page MRN: [id number] DOB: June 29, 1951 Today's Date: 09/17/2020    History of present illness KENEDY HAISLEY is a 69 y.o. 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 Admitted due to weakness, AMS. MRI revealed 3.3 x 4.1 x 2.4 cm meningioma at the skull base on the right. Mass-effect upon the inferior right frontal lobe. Underwent resection 12/23.   OT comments  OT treatment session with focus on self-care re-education, ADL transfers, and standing tolerance during functional activities. Patient continues to demonstrate Min guard to Min A grossly for self-care tasks with use of AD. Patient able to transfer to [facility] over standard toilet with use of RW, complete 3/3 parts of toileting tasks, and complete 3/3 grooming tasks standing at sink level with Min guard for safety and no rest breaks. Patient would benefit from continued acute OT services in prep for safe d/c to next level of care. Patient continues to be limited by deficits in dynamic standing balance, generalized weakness, and need for external assist with ADLs/IADLs with continued recommendation for CIR placement.    Follow Up Recommendations  CIR    Equipment Recommendations  None recommended by OT    Recommendations for Other Services      Precautions / Restrictions Precautions Precautions: Fall Restrictions Weight Bearing Restrictions: No       Mobility Bed Mobility Overal bed mobility: Needs Assistance Bed Mobility: Supine to Sit     Supine to sit: Min guard     General bed mobility comments: Min guard for safety +rail with HOB flat to simulate home environment.  Transfers Overall transfer level: Needs assistance Equipment used: Rolling walker (2 wheeled) Transfers: Sit to/from Stand Sit to Stand: Min guard;Min assist         General transfer  comment: Min guard from elevated surfaces and Min A from low surfaces. Cues for hand placement.    Balance Overall balance assessment: Needs assistance Sitting-balance support: Single extremity supported;No upper extremity supported;Feet supported Sitting balance-Leahy Scale: Good     Standing balance support: Bilateral upper extremity supported Standing balance-Leahy Scale: Poor Standing balance comment: Reliant on BUE support on RW.                           ADL either performed or assessed with clinical judgement   ADL Overall ADL's : Needs assistance/impaired     Grooming: Wash/dry hands;Wash/dry face;Oral care;Brushing hair;Min guard;Standing Grooming Details (indicate cue type and reason): Patient completed 3/3 grooming tasks standing at sink level with use of RW. Min guard for safety.                 Toilet Transfer: Immunologist Details (indicate cue type and reason): Min guard for safety with use of RW. Good safety awareness. Toileting- Architect and Hygiene: Min guard;Sit to/from stand Toileting - Clothing Manipulation Details (indicate cue type and reason): Patient able to complete 3/3 parts of toileting tasks in sitting/standing with Min guard for safety.     Functional mobility during ADLs: Min guard;Rolling walker       Vision       Perception     Praxis      Cognition Arousal/Alertness: Awake/alert Behavior During Therapy: WFL for tasks assessed/performed Overall Cognitive Status: Within Functional Limits for tasks assessed  Exercises     Shoulder Instructions       General Comments      Pertinent Vitals/ Pain       Pain Assessment: No/denies pain  Home Living                                          Prior Functioning/Environment              Frequency  Min 2X/week        Progress Toward Goals  OT  Goals(current goals can now be found in the care plan section)  Progress towards OT goals: Progressing toward goals  Acute Rehab OT Goals Patient Stated Goal: To get stronger. OT Goal Formulation: With patient Time For Goal Achievement: 09/26/20 Potential to Achieve Goals: Good ADL Goals Pt Will Perform Grooming: with min guard assist;standing Pt Will Perform Upper Body Bathing: with set-up;with supervision;sitting Pt Will Perform Lower Body Bathing: with min guard assist;sit to/from stand Pt Will Perform Upper Body Dressing: with set-up;with supervision;sitting Pt Will Perform Lower Body Dressing: with min guard assist;sit to/from stand Additional ADL Goal #1: pt will divide attention with min cues during familiar ADLs  Plan Discharge plan remains appropriate    Co-evaluation                 AM-PAC OT "6 Clicks" Daily Activity     Outcome Measure   Help from another person eating meals?: A Little Help from another person taking care of personal grooming?: A Little Help from another person toileting, which includes using toliet, bedpan, or urinal?: A Little Help from another person bathing (including washing, rinsing, drying)?: A Lot Help from another person to put on and taking off regular upper body clothing?: A Little Help from another person to put on and taking off regular lower body clothing?: A Lot 6 Click Score: 16    End of Session Equipment Utilized During Treatment: Gait belt;Rolling walker  OT Visit Diagnosis: Unsteadiness on feet (R26.81)   Activity Tolerance Patient tolerated treatment well   Patient Left in chair;with call bell/phone within reach;with chair alarm set;with SCD's reapplied   Nurse Communication          Time: 7628-3151 OT Time Calculation (min): 24 min  Charges: OT General Charges $OT Visit: 1 Visit OT Treatments $Self Care/Home Management : 23-37 mins  Beonka Amesquita H. OTR/L Supplemental OT, Department of rehab services  [phone]   Orvel Cutsforth R H. 09/17/2020, 10:07 AM

## 2020-09-17 NOTE — Progress Notes (Signed)
Jamse Arn, MD  Physician  Physical Medicine and Rehabilitation  PMR Pre-admission     Addendum  Date of Service:  09/15/2020  2:45 PM      Related encounter: ED to Hosp-Admission (Discharged) from 09/04/2020 in [facility]           Show:Clear all [x] Manual[x] Template[x] Copied  Added by: [x] Raechel Ache, OT[x] Warren, Caitlin E, PT   [] Hover for details  PMR Admission Coordinator Pre-Admission Assessment   Patient: [name] Page is an 69 y.o., female MRN: [id number] DOB: 1951/05/13 Height: 5\' 2"  (157.5 cm) Weight: 140 lb 14 oz (63.9 kg)                                                                                                                                                  Insurance Information HMO:     PPO:      PCP:      IPA:      80/20:      OTHER:  PRIMARY: Medicare A and B      Policy#: 02/13/1951      Subscriber: pt CM Name:       Phone#:      Fax#:  Pre-Cert#: verified [id number]:  Benefits:  Phone #:      Name:  Eff. Date: 01/19/15 A and B     Deduct: $1484 (2021) $1556 (2022)      Out of Pocket Max: n/a      Life Max: n/a  CIR: 100%      SNF: 20 full days Outpatient: 80%     Co-Pay: 20% Home Health: 100%      Co-Pay:  DME: 80%     Co-Pay: 20% Providers:  SECONDARY: [employer] [place]      Policy#: [id number]      Phone#: [phone]   Financial Counselor:       Phone#:    The "Data Collection Information Summary" for patients in Inpatient Rehabilitation Facilities with attached "Privacy Act [place] Records" was provided and verbally reviewed with: Patient and Family   Emergency Contact Information Contact Information       Name Relation Home Work Mobile    [place] Daughter [phone]    [phone] Jolene Schimke             Current Medical History  Patient Admitting Diagnosis: s/p meningioma resection    History of Present Illness: [name] Page is a 69  y.o. 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  Per chart review patient lives alone.  Mobile home with multiple steps.  She has been many family members that live close by to check on her routinely.  Reportedly independent prior to admission.  Presented 09/04/2020 with progressive weakness particularly involving bilateral lower extremities/headache x2 months as well as family reporting episodes of forgetfulness and confusion.  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  There was a leftward midline shift with subfalcine herniation of the right cingulate gyrus with early entrapment of the left lateral ventricle.  CT renal stone study completed for some hematuria that showed mild right renal pelvis dilatation without significant hydroureter secondary to 3 mm stone in the mid to distal right ureter at the level of the upper sacrum.  Patient underwent right pterional craniotomy for resection of anterior skull base mass 09/11/2020 per Dr. Hoyt KochJonathan Thomas.  Decadron protocol as indicated.  Keppra for seizure prophylaxis.  Subcutaneous Lovenox for DVT prophylaxis initiated 09/12/2020.  Currently on mechanical soft diet.  Physical Medicine & Rehabilitation was consulted to assess candidacy for CIR for impaired mobility and ADLs.  Glasgow Coma Scale Score: 15   Past Medical History      Past Medical History:  Diagnosis Date  . Arthritis    . Basal cell carcinoma    . 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 Cancer (HCC)      melanoma  . Dysuria    . Frequency of urination    . Hematuria    . History of kidney stones    . Osteoporosis    . Renal calculus, bilateral    . 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 Urgency of urination    . Wears glasses        Family History  family history  includes Atrial fibrillation in her father; Cancer in her mother; Colon polyps in her father; Hyperlipidemia in her father and sister; Hypertension in her father and sister; Thyroid disease in her sister.   Prior Rehab/Hospitalizations:  Has the patient had prior rehab or hospitalizations prior to admission? Yes   Has the patient had major surgery during 100 days prior to admission? Yes   Current Medications    Current Facility-Administered Medications:  .  0.9 %  sodium chloride infusion, , Intravenous, Continuous, Hollice EspyKrishnan, Sendil K, MD, Last Rate: 75 mL/hr at 09/16/20 1948, New Bag at 09/16/20 1948 .  acetaminophen (TYLENOL) tablet 650 mg, 650 mg, Oral, Q6H PRN, 650 mg at 09/11/20 1755 **OR** acetaminophen (TYLENOL) suppository 650 mg, 650 mg, Rectal, Q6H PRN, Bedelia Personhomas, Jonathan G, MD .  atorvastatin (LIPITOR) tablet 20 mg, 20 mg, Oral, Daily, Hollice EspyKrishnan, Sendil K, MD, 20 mg at 09/17/20 1100 .  bacitracin ointment 1 application, 1 application, Topical, Daily, Bedelia Personhomas, Jonathan G, MD, 1 application at 09/17/20 1100 .  Chlorhexidine Gluconate Cloth 2 % PADS 6 each, 6 each, Topical, Daily, Hollice EspyKrishnan, Sendil K, MD, 6 each at 09/17/20 1103 .  [COMPLETED] dexamethasone (DECADRON) tablet 4 mg, 4 mg, Oral, Q8H, 4 mg at 09/15/20 0526 **FOLLOWED BY** dexamethasone (DECADRON) tablet 2 mg, 2 mg, Oral, Q8H, 2 mg at 09/17/20 0631 **FOLLOWED BY** [START ON 09/18/2020] dexamethasone (DECADRON) tablet 2 mg, 2 mg, Oral, Q12H **FOLLOWED BY** [START ON 09/21/2020] dexamethasone (DECADRON) tablet 1 mg, 1 mg, Oral, Q12H **FOLLOWED BY** [START ON 09/24/2020] dexamethasone (DECADRON) tablet 1 mg, 1 mg, Oral, Daily, Bedelia Personhomas, Jonathan G, MD .  docusate (COLACE) 50 MG/5ML liquid 100 mg, 100 mg, Oral, BID, Virginia RochesterKrishnan, Sendil K, MD, 100 mg at 09/16/20 1000 .  enalaprilat (VASOTEC) injection 1.25 mg, 1.25 mg, Intravenous,  Q6H PRN, Vallarie Mare, MD, 1.25 mg at 09/16/20 1031 .  enoxaparin (LOVENOX) injection 40 mg, 40 mg,  Subcutaneous, Q24H, Vallarie Mare, MD, 40 mg at 09/16/20 1406 .  escitalopram (LEXAPRO) tablet 10 mg, 10 mg, Oral, Daily, Annita Brod, MD, 10 mg at 09/17/20 1100 .  ferrous sulfate tablet 325 mg, 325 mg, Oral, Q breakfast, Vallarie Mare, MD, 325 mg at 09/17/20 0902 .  hydrALAZINE (APRESOLINE) injection 10 mg, 10 mg, Intravenous, Q6H PRN, Vallarie Mare, MD, 10 mg at 09/13/20 2136 .  hydrALAZINE (APRESOLINE) tablet 25 mg, 25 mg, Oral, Q8H, Annita Brod, MD, 25 mg at 09/17/20 0630 .  HYDROcodone-acetaminophen (NORCO/VICODIN) 5-325 MG per tablet 1 tablet, 1 tablet, Oral, Q4H PRN, Vallarie Mare, MD, 1 tablet at 09/11/20 1953 .  labetalol (NORMODYNE) injection 10 mg, 10 mg, Intravenous, Q10 min PRN, Vallarie Mare, MD, 10 mg at 09/16/20 0933 .  levETIRAcetam (KEPPRA) tablet 500 mg, 500 mg, Oral, BID, Vallarie Mare, MD, 500 mg at 09/17/20 1100 .  morphine 2 MG/ML injection 1-2 mg, 1-2 mg, Intravenous, Q2H PRN, Vallarie Mare, MD .  naphazoline-glycerin (CLEAR EYES REDNESS) ophth solution 2 drop, 2 drop, Right Eye, TID, Annita Brod, MD, 2 drop at 09/17/20 0903 .  ondansetron (ZOFRAN) tablet 4 mg, 4 mg, Oral, Q6H PRN **OR** ondansetron (ZOFRAN) injection 4 mg, 4 mg, Intravenous, Q6H PRN, Vallarie Mare, MD .  ondansetron [facility]) tablet 4 mg, 4 mg, Oral, Q4H PRN **OR** ondansetron (ZOFRAN) injection 4 mg, 4 mg, Intravenous, Q4H PRN, Vallarie Mare, MD .  pantoprazole (PROTONIX) EC tablet 40 mg, 40 mg, Oral, Daily, Vallarie Mare, MD, 40 mg at 09/17/20 1100 .  polyethylene glycol (MIRALAX / GLYCOLAX) packet 17 g, 17 g, Oral, Daily PRN, Vallarie Mare, MD .  polyvinyl alcohol (LIQUIFILM TEARS) 1.4 % ophthalmic solution 1 drop, 1 drop, Both Eyes, BID PRN, Annita Brod, MD, 1 drop at 09/16/20 0859 .  promethazine (PHENERGAN) tablet 12.5-25 mg, 12.5-25 mg, Oral, Q4H PRN, Vallarie Mare, MD .  sodium phosphate (FLEET) 7-19 GM/118ML enema  1 enema, 1 enema, Rectal, Once PRN, Vallarie Mare, MD   Patients Current Diet:  Diet Order                  DIET SOFT Room service appropriate? Yes with Assist; Fluid consistency: Thin  Diet effective now                         Precautions / Restrictions Precautions Precautions: Fall Restrictions Weight Bearing Restrictions: No    Has the patient had 2 or more falls or a fall with injury in the past year?Yes   Prior Activity Level Community (5-7x/wk): no DME prior to admit, driving, some falls prior to admission   Prior Functional Level Prior Function Level of Independence: Independent Comments: drove and completed her ADL's PTA   Self Care: Did the patient need help bathing, dressing, using the toilet or eating?  Independent   Indoor Mobility: Did the patient need assistance with walking from room to room (with or without device)? Independent   Stairs: Did the patient need assistance with internal or external stairs (with or without device)? Independent   Functional Cognition: Did the patient need help planning regular tasks such as shopping or remembering to take medications? Independent   Home Assistive Devices / Equipment Home Assistive Devices/Equipment: None Home Equipment: None  Prior Device Use: Indicate devices/aids used by the patient prior to current illness, exacerbation or injury? None of the above   Current Functional Level Cognition   Arousal/Alertness: Awake/alert Overall Cognitive Status: Within Functional Limits for tasks assessed Current Attention Level: Alternating Orientation Level: Oriented X4 General Comments: delayed processing, STM deficits Attention: Sustained Sustained Attention: Appears intact Memory: Impaired Memory Impairment: Retrieval deficit (4/5) Awareness: Impaired Awareness Impairment: Intellectual impairment Problem Solving: Impaired Problem Solving Impairment: Verbal basic Executive Function: Self  Monitoring,Self Correcting Safety/Judgment: Impaired    Extremity Assessment (includes Sensation/Coordination)   Upper Extremity Assessment: RUE deficits/detail,LUE deficits/detail RUE Deficits / Details: Rt UE tremulous.  AROM WFL grossly 4/5 RUE Coordination: decreased fine motor LUE Deficits / Details: Pt with h/o Rt shoulder injury with very limited ROM of shoulder.  Elbow distally WFL, mildly tremulous LUE Coordination: decreased fine motor  Lower Extremity Assessment: Defer to PT evaluation     ADLs   Overall ADL's : Needs assistance/impaired Eating/Feeding: Set up,Sitting Grooming: Wash/dry hands,Wash/dry face,Oral care,Brushing hair,Min guard,Standing Grooming Details (indicate cue type and reason): Patient completed 3/3 grooming tasks standing at sink level with use of RW. Min guard for safety. Upper Body Bathing: Min guard,Sitting Lower Body Bathing: Maximal assistance,Sit to/from stand Upper Body Dressing : Minimal assistance,Sitting Lower Body Dressing: Moderate assistance,Sit to/from stand Toilet Transfer: Min Hydrologist Details (indicate cue type and reason): Min guard for safety with use of RW. Good safety awareness. Toileting- Architect and Hygiene: Min guard,Sit to/from stand Toileting - Architect Details (indicate cue type and reason): Patient able to complete 3/3 parts of toileting tasks in sitting/standing with Min guard for safety. Functional mobility during ADLs: Min guard,Rolling walker General ADL Comments: pt noted to have drainage and odor on posterior back bandage. pt with new bandage applied this session. pt and daughter thanking staff     Mobility   Overal bed mobility: Needs Assistance Bed Mobility: Supine to Sit Supine to sit: Min guard General bed mobility comments: Min guard for safety +rail with HOB flat to simulate home environment.     Transfers   Overall transfer level: Needs assistance Equipment used:  Rolling walker (2 wheeled) Transfers: Sit to/from Stand Sit to Stand: Min guard,Min assist Stand pivot transfers: Mod assist,+2 safety/equipment,Min assist,+2 physical assistance General transfer comment: Min guard from elevated surfaces and Min A from low surfaces. Cues for hand placement.     Ambulation / Gait / Stairs / Wheelchair Mobility   Ambulation/Gait Ambulation/Gait assistance: Editor, commissioning (Feet): 40 Feet Assistive device: Rolling walker (2 wheeled) Gait Pattern/deviations: Trunk flexed,Decreased stride length General Gait Details: vc's for posture, unsteady gait, narrow BOS, decreased pace, has difficulty with navigation around obstacles Gait velocity: decreased Gait velocity interpretation: <1.31 ft/sec, indicative of household ambulator     Posture / Balance Dynamic Sitting Balance Sitting balance - Comments: able to reach to socks in sitting without LOB but increased time needed to perform safely and close guarding Balance Overall balance assessment: Needs assistance Sitting-balance support: Single extremity supported,No upper extremity supported,Feet supported Sitting balance-Leahy Scale: Good Sitting balance - Comments: able to reach to socks in sitting without LOB but increased time needed to perform safely and close guarding Standing balance support: Bilateral upper extremity supported Standing balance-Leahy Scale: Poor Standing balance comment: Reliant on BUE support on RW. High Level Balance Comments: worked on standing balance activities at counter with unilateral support and min A including: wt shifting with eyes closed, tandem stance, unilateral stance, side  stepping     Special needs/care consideration Decadron taper        Previous Home Environment (from acute therapy documentation) Living Arrangements: Alone  Lives With: Alone Available Help at Discharge: Family,Available 24 hours/day Type of Home: Mobile home Home Access: Stairs to  enter Entrance Stairs-Rails: Surveyor, mining of Steps: 9 Bathroom Shower/Tub: Multimedia programmer: Handicapped height Pulpotio Bareas: No Additional Comments: Many family member live adjacent to her, sister does not work and can assist   Discharge Living Setting Plans for Discharge Living Setting: Patient's home Type of Home at Discharge: Mobile home Discharge Home Layout: One level Discharge Home Access: Stairs to enter Entrance Stairs-Rails: Surveyor, mining of Steps: 9 Discharge Bathroom Shower/Tub: Walk-in shower Discharge Bathroom Toilet: Handicapped height Does the patient have any problems obtaining your medications?: No   Social/Family/Support Systems Anticipated Caregiver: multiple family members, daughter is main contact Public relations account executive) Anticipated Caregiver's Contact Information: [name] [phone]; Manuela Schwartz (sister) [phone] Ability/Limitations of Caregiver: supervision to min guard Caregiver Availability: 24/7 Discharge Plan Discussed with Primary Caregiver: Yes Is Caregiver In Agreement with Plan?: Yes Does Caregiver/Family have Issues with Lodging/Transportation while Pt is in Rehab?: No     Goals Patient/Family Goal for Rehab: PT/OT mod I, SLP supervision to mod I Expected length of stay: 7-10 days Pt/Family Agrees to Admission and willing to participate: Yes Program Orientation Provided & Reviewed with Pt/Caregiver Including Roles  & Responsibilities: Yes     Decrease burden of Care through IP rehab admission: n/a     Possible need for SNF placement upon discharge:n/a     Patient Condition: This patient's medical and functional status has changed since the consult dated: 09/15/20 in which the Rehabilitation Physician determined and documented that the patient's condition is appropriate for intensive rehabilitative care in an inpatient rehabilitation facility. See "History of Present Illness" (above) for medical  update. Functional changes are: progression from Min A +2 for transfers to Min A and progression from no gait to Min A for 40 feet. Patient's medical and functional status update has been discussed with the Rehabilitation physician and patient remains appropriate for inpatient rehabilitation. Will admit to inpatient rehab today.   Preadmission Screen Completed By:  Shann Medal, 09/17/2020 11:30 AM  With day of admit updates completed by Raechel Ache OTR/L ______________________________________________________________________   Discussed status with Dr. Posey Pronto on 09/17/20 at 11:15AM and received approval for admission today.   Admission Coordinator:  Shann Medal  With day of admit updates completed by Raechel Ache OTR/L on , time 11:15AM/Date 09/17/20             Revision History                                       Note Details  Author Posey Pronto, Domenick Bookbinder, MD File Time 09/17/2020 11:31 AM  Author Type Physician Status Addendum  Last Editor Jamse Arn, MD Service Physical Medicine and [place] # [id number] Admit Date 09/17/2020

## 2020-09-17 NOTE — Progress Notes (Signed)
Inpatient Rehabilitation  Patient information reviewed and entered into eRehab system by Mareena Cavan M. Oceanna Arruda, M.A., CCC/SLP, PPS Coordinator.  Information including medical coding, functional ability and quality indicators will be reviewed and updated through discharge.    

## 2020-09-17 NOTE — Progress Notes (Signed)
  Speech Language Pathology Treatment: Cognitive-Linquistic  Patient Details Name: [name] Page MRN: [id number] DOB: 12/07/1950 Today's Date: 09/17/2020 Time: 1450-1505 SLP Time Calculation (min) (ACUTE ONLY): 15 min  Assessment / Plan / Recommendation Clinical Impression  Pt seen for skilled treatment with attention/memory re: deficits/functional tasks with min verbal cues provided as pt was able to recall strategies required to maintain safety within current environment such as requiring assistance with going to bathroom within hospital room, using walker to ambulate, taking smaller bites with meals on left side d/t pain in right, etc.  She stated her "thinking is a lot more clearer" now vs 1 day post-op from resection.  Speech clear and intelligible within complex conversation.  Pt able to indicate precautions needed for ADLs and steps for personal hygiene activities and consuming meals.  Recommend ST f/u while in CIR once discharged for cognitive deficits prn.   HPI HPI: [name] Page is a 69 y.o. 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 Admitted due to weakness, AMS. MRI revealed 3.3 x 4.1 x 2.4 cm meningioma at the skull base on the right. Mass-effect upon the inferior right frontal lobe. Underwent resection 12/23.      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

## 2020-09-17 NOTE — Progress Notes (Signed)
Pt admitted to room 4W20. oriented to floor, call bell and rehab fall policy. Denies pain or discomfort. Currently resting in bed with all need within reach.   Marylu Lund, RN

## 2020-09-17 NOTE — Progress Notes (Signed)
Inpatient Rehabilitation-Admissions Coordinator   Met with pt bedside to notify her of bed offer in CIR. She has accepted. Received medical clearance from Dr. Manfred Arch for admit to CIR today. Reviewed insurance benefits letter and consent forms with the patient. All questions answered. TOC and RN updated on plan for admit today.   Please call if questions.   Raechel Ache, OTR/L  Rehab Admissions Coordinator  [phone] 09/17/2020 11:13 AM

## 2020-09-17 NOTE — TOC Transition Note (Signed)
Transition of Care [facility]) - CM/SW Discharge Note   Patient Details  Name: [name] Page MRN: [id number] Date of Birth: [birth year]  Transition of Care [facility]) CM/SW Contact:  Kermit Balo, RN Phone Number: 09/17/2020, 1:01 PM   Clinical Narrative:    Pt is discharging to CIR today. CM signing off.   Final next level of care: IP Rehab Facility Barriers to Discharge: No Barriers Identified   Patient Goals and CMS Choice   CMS Medicare.gov Compare Post Acute Care list provided to:: Patient Choice offered to / list presented to : Patient  Discharge Placement                       Discharge Plan and Services                                     Social Determinants of Health (SDOH) Interventions     Readmission Risk Interventions No flowsheet data found.

## 2020-09-17 NOTE — Progress Notes (Signed)
Izora Ribas, MD  Physician  Physical Medicine and Rehabilitation  Consult Note     Signed  Date of Service:  09/15/2020  6:40 AM      Related encounter: ED to Hosp-Admission (Discharged) from 09/04/2020 in [facility]       Signed      Expand All Collapse All     Show:Clear all [x] Manual[x] Template[] Copied  Added by: [x] Angiulli, Lavon Paganini, PA-C[x] Raulkar, Clide Deutscher, MD   [] Hover for details           Physical Medicine and Rehabilitation Consult Reason for Consult: Weakness with altered mental status Referring Physician: [facility]     HPI: [name] Page is a 69 y.o. 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  Per chart review patient lives alone.  Mobile home with multiple steps.  She has been many family members that live close by to check on her routinely.  Reportedly independent prior to admission.  Presented 09/04/2020 with progressive weakness particularly involving bilateral lower extremities/headache x2 months as well as family reporting episodes of forgetfulness and confusion.  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  There was a leftward midline shift with subfalcine herniation of the right cingulate gyrus with early entrapment of the left lateral ventricle.  CT renal stone study completed for some hematuria that showed mild right renal pelvis dilatation without significant hydroureter secondary to 3 mm stone in the mid to distal right ureter at the level of the upper sacrum.  Patient underwent right pterional craniotomy for resection of anterior skull base mass 09/11/2020 per Dr. Duffy Rhody.  Decadron protocol as indicated.  Keppra for seizure prophylaxis.  Subcutaneous Lovenox for DVT prophylaxis initiated 09/12/2020.  Currently on mechanical soft diet.  Physical  Medicine & Rehabilitation was consulted to assess candidacy for CIR for impaired mobility and ADLs. She lived independently prior to admission. Family is able to provide 24/7 support if needed.      Review of Systems  Constitutional: Negative for chills and fever.  HENT: Negative for hearing loss.   Eyes: Negative for blurred vision and double vision.  Respiratory: Negative for cough and shortness of breath.   Cardiovascular: Positive for leg swelling. Negative for chest pain and palpitations.  Gastrointestinal: Positive for constipation. Negative for heartburn, nausea and vomiting.  Genitourinary: Positive for dysuria and hematuria. Negative for flank pain.  Musculoskeletal: Positive for myalgias.  Skin: Negative for rash.  Neurological: Positive for weakness and headaches.  Psychiatric/Behavioral: Positive for memory loss.  All other systems reviewed and are negative.   Past Medical History:  Diagnosis Date  . Arthritis    . Basal cell carcinoma    . 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 Cancer ([place])      melanoma  . Dysuria    . Frequency of urination    . Hematuria    . History of kidney stones    . Osteoporosis    . Renal calculus, bilateral    . 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 Urgency of urination    . Wears glasses           Past Surgical History:  Procedure Laterality Date  . CYSTOSCOPY WITH RETROGRADE PYELOGRAM, URETEROSCOPY AND STENT PLACEMENT Bilateral 08/11/2013  Procedure: CYSTOSCOPY WITH RETROGRADE PYELOGRAM, URETEROSCOPY AND STENT PLACEMENT;  Surgeon: Sebastian Ache, MD;  Location: [facility];  Service: Urology;  Laterality: Bilateral;  . CYSTOSCOPY WITH URETEROSCOPY AND STENT PLACEMENT Bilateral 08/29/2013    Procedure: CYSTOSCOPY WITH BILATERAL URETEROSCOPY AND STENT REPLACEMENTS, stone extraction;  Surgeon: Sebastian Ache, MD;  Location: [facility];  Service: Urology;  Laterality: Bilateral;  .  CYSTOSCOPY/RETROGRADE/URETEROSCOPY/STONE EXTRACTION WITH BASKET Bilateral 01/31/2016    Procedure: CYSTOSCOPY/BILATERAL RETROGRADE PYELOGRAMS/BILATERAL URETEROSCOPY/BILATERAL STONE EXTRACTION WITH BASKET/RIGHT URETERAL STENT PLACEMENT;  Surgeon: Heloise Purpura, MD;  Location: [facility];  Service: Urology;  Laterality: Bilateral;  . HEMIARTHROPLASTY SHOULDER FRACTURE Left 1992  . HOLMIUM LASER APPLICATION Bilateral 08/29/2013    Procedure: HOLMIUM LASER APPLICATION;  Surgeon: Sebastian Ache, MD;  Location: [facility];  Service: Urology;  Laterality: Bilateral;         Family History  Problem Relation Age of Onset  . Cancer Mother          unknown source, stomach and liver   . Atrial fibrillation Father    . Hyperlipidemia Father    . Hypertension Father    . Colon polyps Father    . Hypertension Sister    . Hyperlipidemia Sister    . Thyroid disease Sister    . Esophageal cancer Neg Hx    . Pancreatic cancer Neg Hx      Social History:  reports that she has never smoked. She has never used smokeless tobacco. She reports that she does not drink alcohol and does not use drugs. Allergies:      Allergies  Allergen Reactions  . Sulfa Antibiotics Rash          Medications Prior to Admission  Medication Sig Dispense Refill  . atorvastatin (LIPITOR) 20 MG tablet TAKE 1 TABLET BY MOUTH EVERY DAY 30 tablet 0  . Calcium Carbonate-Vit D-Min (CALCIUM 1200 PO) Take 1 tablet by mouth daily.      Marland Kitchen escitalopram (LEXAPRO) 10 MG tablet TAKE 1 TABLET BY MOUTH EVERY DAY 90 tablet 1  . ferrous sulfate 325 (65 FE) MG tablet Take 325 mg by mouth daily with breakfast.      . Glycerin-Hypromellose-PEG 400 0.2-0.2-1 % SOLN Apply 1 drop to eye 2 (two) times daily as needed (dry eyes).       Marland Kitchen ibuprofen (ADVIL,MOTRIN) 200 MG tablet Take 200-400 mg by mouth every 6 (six) hours as needed for moderate pain.      . Multiple Vitamin (MULTIVITAMIN WITH MINERALS) TABS tablet Take 1 tablet by mouth daily.       Marland Kitchen loratadine (CLARITIN) 10 MG tablet Take 10 mg by mouth daily as needed for allergies.          Home: Home Living Family/patient expects to be discharged to:: Private residence Living Arrangements: Alone Available Help at Discharge: Family,Available 24 hours/day Type of Home: Mobile home Home Access: Stairs to enter Entrance Stairs-Number of Steps: 9 Entrance Stairs-Rails: Right,Left Bathroom Shower/Tub: Health visitor: Handicapped height Home Equipment: None Additional Comments: Many family member live adjacent to her, sister does not work and can assist  Lives With: Alone  Functional History: Prior Function Level of Independence: Independent Comments: drove and completed her ADL's PTA Functional Status:  Mobility: Bed Mobility Overal bed mobility: Needs Assistance Bed Mobility: Supine to Sit Supine to sit: Min assist General bed mobility comments: minor stability assist.  slow to w/shift and scoot Transfers Overall transfer level: Needs assistance Equipment used: 2 person  hand held assist Transfers: Sit to/from [place] to Stand: Min assist,+2 physical assistance,+2 safety/equipment Stand pivot transfers: Mod assist,+2 safety/equipment,Min assist,+2 physical assistance General transfer comment: cues for hand placement, assist forward and minimal boost, light mod assist for pivot to chair or side stepping toward HOB.   ADL: ADL Overall ADL's : Needs assistance/impaired Eating/Feeding: Set up,Sitting Grooming: Wash/dry hands,Wash/dry face,Oral care,Minimal assistance,Sitting Upper Body Bathing: Minimal assistance,Sitting Lower Body Bathing: Moderate assistance,Sit to/from stand Upper Body Dressing : Minimal assistance,Sitting Lower Body Dressing: Moderate assistance,Sit to/from stand Toilet Transfer: Moderate assistance,+2 for physical assistance,+2 for safety/equipment,Stand-pivot,BSC Toileting- Clothing Manipulation and  Hygiene: Maximal assistance,Sit to/from stand Functional mobility during ADLs: Moderate assistance,+2 for physical assistance,+2 for safety/equipment   Cognition: Cognition Overall Cognitive Status: Impaired/Different from baseline Arousal/Alertness: Awake/alert Orientation Level: Oriented X4 Attention: Sustained Sustained Attention: Appears intact Memory: Impaired Memory Impairment: Retrieval deficit (4/5) Awareness: Impaired Awareness Impairment: Intellectual impairment Problem Solving: Impaired Problem Solving Impairment: Verbal basic Executive Function: Self Monitoring,Self Correcting Safety/Judgment: Impaired Cognition Arousal/Alertness: Awake/alert Behavior During Therapy: WFL for tasks assessed/performed Overall Cognitive Status: Impaired/Different from baseline Area of Impairment: Attention,Problem solving Current Attention Level: Selective Problem Solving: Requires verbal cues,Difficulty sequencing,Slow processing   Blood pressure (!) 150/71, pulse 71, temperature 97.7 F (36.5 C), temperature source Oral, resp. rate 14, height 5\' 2"  (1.575 m), weight 63.9 kg, SpO2 96 %. Physical Exam General: Alert, No apparent distress HEENT: Craniotomy site clean and dry. Neck: Supple without JVD or lymphadenopathy Heart: Reg rate and rhythm. No murmurs rubs or gallops Chest: CTA bilaterally without wheezes, rales, or rhonchi; no distress Abdomen: Soft, non-tender, non-distended, bowel sounds positive. Extremities: No clubbing, cyanosis, or edema. Pulses are 2+ Skin: Clean and intact without signs of breakdown Neuro: Patient is alert in no acute distress.  Makes eye contact with examiner.  Provides her name and age.  Some mild delay in processing.  Follows commands. 4/5 strength throughout Musculoskeletal: Full ROM, No pain with AROM or PROM in the neck, trunk, or extremities. Posture appropriate Psych: Pt's affect is appropriate. Pt is cooperative   Lab Results Last 24 Hours  No  results found for this or any previous visit (from the past 24 hour(s)).   Imaging Results (Last 48 hours)  No results found.       Assessment/Plan: Diagnosis: s/p meningioma resection 1. Does the need for close, 24 hr/day medical supervision in concert with the patient's rehab needs make it unreasonable for this patient to be served in a less intensive setting? Yes 2. Co-Morbidities requiring supervision/potential complications: altered mental status, vasogenic cerebral edema, generalized weakness, squamous cell carcinoma of the skin, nephrolithiasis, UTI 3. Due to bladder management, bowel management, safety, skin/wound care, disease management, medication administration, pain management and patient education, does the patient require 24 hr/day rehab nursing? Yes 4. Does the patient require coordinated care of a physician, rehab nurse, therapy disciplines of PT, OT, SLP to address physical and functional deficits in the context of the above medical diagnosis(es)? Yes Addressing deficits in the following areas: balance, endurance, locomotion, strength, transferring, bowel/bladder control, bathing, dressing, feeding, grooming, toileting 5. Can the patient actively participate in an intensive therapy program of at least 3 hrs of therapy per day at least 5 days per week? Yes 6. The potential for patient to make measurable gains while on inpatient rehab is excellent 7. Anticipated functional outcomes upon discharge from inpatient rehab are modified independent  with PT, modified independent with OT, independent to S with SLP. 8. Estimated  rehab length of stay to reach the above functional goals is: 10-14 days 9. Anticipated discharge destination: Home 10. Overall Rehab/Functional Prognosis: excellent   RECOMMENDATIONS: This patient's condition is appropriate for continued rehabilitative care in the following setting: CIR Patient has agreed to participate in recommended program. Yes Note that  insurance prior authorization may be required for reimbursement for recommended care.   Comment:  1) Meningioma s/p resection: Incision healing well. Staples to be removed 1/04. 2) Impaired mobility and ADLs: Currently Min-Mod Ax2 10 feet RW, staggering gait: admit to CIR, estimated 10-14 day stay. 3) Slow processing: can likely be independent on discharge, but will have 24/7 family support if needed.  4) Labile BP: continue to monitor TID in CIR   Thank you for this consult. Admission coordinator to follow.    I have personally performed a face to face diagnostic evaluation, including, but not limited to relevant history and physical exam findings, of this patient and developed relevant assessment and plan.  Additionally, I have reviewed and concur with the physician assistant's documentation above.   Leeroy Cha, MD   [name] Parsons, PA-C 09/15/2020          Revision History                        Routing History              Note Details  Author Izora Ribas, MD File Time 09/15/2020  2:16 PM  Author Type Physician Status Signed  Last Editor Izora Ribas, MD Service Physical Medicine and [place] # [phone] Admit Date 09/17/2020

## 2020-09-17 NOTE — Plan of Care (Signed)
  Problem: Acute Rehab OT Goals (only OT should resolve) Goal: Pt. Will Transfer To Toilet Outcome: Completed/Met Goal: Pt. Will Perform Toileting-Clothing Manipulation Outcome: Completed/Met

## 2020-09-17 NOTE — Discharge Summary (Addendum)
Discharge Summary  [name] Page [id number] DOB: 11-09-1950  PCP: Janora Norlander, DO  Admit date: 09/04/2020 Discharge date: 09/17/2020  Time spent: 25 minutes   Recommendations for Outpatient Follow-up:  1. Pt being transferred to Inpatient Rehab today 2. New medication: Hydralazine 25 mg every 8 hours  Discharge Diagnoses:  Active Hospital Problems   Diagnosis Date Noted  . Meningioma ([place]) 09/04/2020  . CKD (chronic kidney disease), stage III ([place]) 09/10/2020  . AMS (altered mental status) 09/04/2020  . 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 Generalized weakness 09/04/2020  . Cancer of skin, squamous cell 09/04/2020  . Nephrolithiasis 09/04/2020  . UTI (urinary tract infection) 09/04/2020  Essential hypertension  Resolved Hospital Problems  No resolved problems to display.    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 She was recently started on iron supplementsfor anemia. Apparently she was scheduled for EGD and colonoscopy on 09/05/20.  Her daughter reports shehas been confused,forgetful and been complaining about headache recently, Patient reports intermittent headache for almost a yearwhich has recently gotten worse.  Head CT showed anterior cranial fossa meningioma causing severe vasogenic edema within the right frontal lobe, leftward midline shift with subfalcine herniation of the right cingulate gyrus with early entrapment of the left lateral  ventricle.   Hospital Course:    Meningioma [facility]): Looks to be the cause of her intermittent headaches for the past year.  MRI and CT noted large meningioma causing mass-effect.  Started on IV dexamethasone and Keppra.  Seen by neurosurgery & underwent resection of mass on 12/23 by neurosurgery.  Accepted by inpatient rehab with bed available today.  Initial swallowing issues post surgery have resolved.  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  Looks to be resolved.     Cancer of skin, squamous cell: Squamous cell of skin of chest, right breast and lower back.  Initially had been seeing a dermatologist, but since then had stopped following up.  Daughter was unaware lesions had grown in size.  Discussed with oncology who recommend outpatient follow-up with dermatology upon discharge.  Dermatology does not come into the hospital.   Stage IIIa chronic kidney disease: Looks to be at baseline  Covid prevention: Patient requested Moderna booster.  Given 12/28.  Essential hypertension: Patient with no previous history of hypertension noted to have consistently elevated blood pressures during this hospitalization.  Started on p.o. hydralazine and pressures much improved.  She will continue this medication long-term.   UTI (urinary tract infection): Urinalysis with moderate leukocytosis and CT of abdomen bilateral renal stones, but none obstruction without significant hydroureter.  Completed 3-day course of IV Rocephin.  Insignificant growth in culture.  Discussed with urology  Iron deficiency anemia: Recently found to be anemic and started on iron supplement.  Was supposed to have EGD and colonoscopy done on 12/17.  Hemoglobin down to 8, but since  then has come up on its own.  Plan is to reschedule outpatient scope although may need  inpatient consultation if hemoglobin has dropped.  We will continue to follow.  Hemoglobin on day of discharge at 8.6  Right eye irritation: Some erythema noted on 12/26.  Much improved  Consultants:  Neurosurgery  Urology  Procedures:  meningioma resection 12/23  Discharge Exam: BP (!) 159/69 (BP Location: Left Arm)   Pulse (!) 59   Temp 98 F (36.7 C) (Oral)   Resp 16   Ht 5\' 2"  (1.575 m)   Wt 63.9 kg   SpO2 98%   BMI 25.77 kg/m   General: Alert & oriented x 3  Cardiovascular: RRR S1 S2  Respiratory: Clear to auscultation bilaterally   Discharge Instructions You were cared for by a hospitalist during your hospital stay. If you have any questions about your discharge medications or the care you received while you were in the hospital after you are discharged, you can call the unit and asked to speak with the hospitalist on call if the hospitalist that took care of you is not available. Once you are discharged, your primary care physician will handle any further medical issues. Please note that NO REFILLS for any discharge medications will be authorized once you are discharged, as it is imperative that you return to your primary care physician (or establish a relationship with a primary care physician if you do not have one) for your aftercare needs so that they can reassess your need for medications and monitor your lab values.  Continued on the following home medications: Lipitor 10mg  daily Fosamax 70gm weekly Oscal-D 120 mg daily Lexapro 10 mg daily Iron Sulfate 325 mg daily Multivitamin daily  Continue medications from hospitalization Lovenox 40 mg subcu daily Decadron taper 2 mg every 12 hours starting 12/30 x 3 days, then 1 mg every 12 hours x3 days starting 1/2, then 1 mg daily x3 days starting 1/5 Colace 100 p.o. twice daily Bacitracin ointment 1 application topical daily to stapled scalp incision Tylenol 650 p.o. every 6 hours as needed for mild pain or  fever Hydralazine 25 mg every 8 hours Clear eyes eyedrops 2 drops each eye as needed 3 times daily Zofran 4 mg every 6 hours as needed p.o. for nausea MiraLAX 17 g p.o. daily as needed   Allergies  Allergen Reactions  . Sulfa Antibiotics Rash      The results of significant diagnostics from this hospitalization (including imaging, microbiology, ancillary and laboratory) are listed below for reference.    Significant Diagnostic Studies: CT HEAD WO CONTRAST  Result Date: 09/10/2020 CLINICAL DATA:  Initial evaluation for brain mass, [facility] protocol for surgical planning. EXAM: CT HEAD WITHOUT CONTRAST TECHNIQUE: Contiguous axial images were obtained from the base of the skull through the vertex without intravenous contrast. COMPARISON:  Previous MRI from 09/05/2020. FINDINGS: Brain: Large meningioma positioned at the anterior right skull base again seen stable in size and appearance measuring up to 4 cm in diameter on this exam. Surrounding vasogenic edema and regional mass effect within the adjacent right cerebral hemisphere is unchanged. Right lateral ventricle partially effaced. Up to 9 mm right-to-left shift at the septum pellucidum with asymmetric dilatation of the left lateral ventricle, stable. No acute intracranial hemorrhage. No visible acute large vessel territory infarct. No other mass lesion or extra-axial fluid collection. Vascular: No abnormal hyperdense vessel. Skull: Scalp soft tissues within normal limits.  Calvarium intact. Sinuses/Orbits: Globes and orbital soft tissues demonstrate  no acute finding. Paranasal sinuses are clear. No mastoid effusion. Other: None. IMPRESSION: 1. Brain [facility] protocol for preoperative planning and guidance. 2. Large approximate 4 cm meningioma at the anterior right skull base with associated regional mass effect right-to-left midline shift, stable. 3. No other acute intracranial abnormality. Electronically Signed   By: Rise MuBenjamin  McClintock M.D.    On: 09/10/2020 02:12   CT Head W or Wo Contrast  Result Date: 09/04/2020 CLINICAL DATA:  Encephalopathy EXAM: CT HEAD WITHOUT AND WITH CONTRAST TECHNIQUE: Contiguous axial images were obtained from the base of the skull through the vertex without and with intravenous contrast CONTRAST:  75mL OMNIPAQUE IOHEXOL 300 MG/ML  SOLN COMPARISON:  None. FINDINGS: Brain: There is a homogeneously enhancing extra-axial mass of the anterior cranial fossa measuring 3.5 x 2.2 cm. There is a large amount of vasogenic edema in the overlying right frontal lobe. There is leftward midline shift with subfalcine herniation of the right cingulate gyrus. There is no intracranial hemorrhage or other extra-axial collection. The right lateral ventricle is effaced in the left is mildly dilated. Vascular: No hyperdense vessel or unexpected calcification. Visible vessels are patent. Skull: Normal. Negative for fracture or focal lesion. Sinuses/Orbits: No acute finding. Other: None. 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 There is leftward midline shift with subfalcine herniation of the right cingulate gyrus with early entrapment of the left lateral ventricle. Critical Value/emergent results were called by telephone at the time of interpretation on 09/04/2020 at 8:44 pm to provider GARRETT GREEN , who verbally acknowledged these results. Electronically Signed   By: Deatra RobinsonKevin  Herman M.D.   On: 09/04/2020 20:46   MR BRAIN W WO CONTRAST  Result Date: 09/12/2020 CLINICAL DATA:  Meningioma resection. EXAM: MRI HEAD WITHOUT AND WITH CONTRAST TECHNIQUE: Multiplanar, multiecho pulse sequences of the brain and surrounding structures were obtained without and with intravenous contrast. CONTRAST:  6.485mL GADAVIST GADOBUTROL 1 MMOL/ML IV SOLN COMPARISON:  Seven days ago FINDINGS: Brain: Resected meningioma from the right paramedian anterior cranial fossa with no nodular residual, only  wispy postoperative and vascular appearing enhancement. The degree of vasogenic edema is similar, and extensive, with midline shift of at least 1 cm. No change in ventricular volume with compression of the right lateral ventricle. No complicating infarct. Expected postoperative thin fluid collection deep to the craniotomy flap. No measurable hematoma. Vascular: Normal flow voids and vascular enhancements. Skull and upper cervical spine: No evident complication of pterional craniotomy. Sinuses/Orbits: Unremarkable IMPRESSION: Resected meningioma without complicating feature. Vasogenic edema and midline shift is unchanged. Electronically Signed   By: Marnee SpringJonathon  Watts M.D.   On: 09/12/2020 07:23   MR BRAIN W WO CONTRAST  Result Date: 09/05/2020 CLINICAL DATA:  Encephalopathy.  Meningioma shown by CT. EXAM: MRI HEAD WITHOUT AND WITH CONTRAST TECHNIQUE: Multiplanar, multiecho pulse sequences of the brain and surrounding structures were obtained without and with intravenous contrast. CONTRAST:  6.625mL GADAVIST GADOBUTROL 1 MMOL/ML IV SOLN COMPARISON:  Head CT yesterday. FINDINGS: Brain: Diffusion imaging does not show any acute or subacute infarction. There is a large meningioma at the skull base on the right with the epicenter at the dorsal planum sphenoidale. This measures 3.3 cm front to back, 4.1 cm right to left and 2.4 cm cephalo caudal. Mass-effect upon the inferior right frontal lobe. Pronounced regional vasogenic edema affecting the right cerebral hemisphere. Mass effect with right-to-left shift of 2 cm. Question some degree of trapping of the left lateral ventricle. Mild  background pattern of chronic small vessel change of the white matter seen otherwise. No hemorrhage. No extra-axial fluid collection. Vascular: Major vessels at the base of the brain show flow. Skull and upper cervical spine: Negative Sinuses/Orbits: Clear/normal Other: None IMPRESSION: 3.3 x 4.1 x 2.4 cm meningioma at the skull base on the  right with the epicenter at the dorsal planum sphenoidale. Mass-effect upon the inferior right frontal lobe. Pronounced regional vasogenic edema affecting the right cerebral hemisphere. Mass effect with right-to-left shift of 2 cm. Question some degree of trapping of the left lateral ventricle. No apparent change when compared to yesterday's CT. Electronically Signed   By: Paulina Fusi M.D.   On: 09/05/2020 15:56   CT CHEST ABDOMEN PELVIS W CONTRAST  Result Date: 09/05/2020 CLINICAL DATA:  Brain meningioma. EXAM: CT CHEST, ABDOMEN, AND PELVIS WITH CONTRAST TECHNIQUE: Multidetector CT imaging of the chest, abdomen and pelvis was performed following the standard protocol during bolus administration of intravenous contrast. CONTRAST:  38mL OMNIPAQUE IOHEXOL 350 MG/ML SOLN COMPARISON:  Multiple prior CT abdomen and pelvis, most recently from yesterday. FINDINGS: CT CHEST FINDINGS Cardiovascular: No significant vascular findings. Normal heart size. No pericardial effusion. No thoracic aortic aneurysm or dissection. No central pulmonary embolism. Mediastinum/Nodes: No enlarged mediastinal, hilar, or axillary lymph nodes. 1.6 cm heterogeneously hypodense right thyroid nodule. The trachea and esophagus demonstrate no significant findings. Lungs/Pleura: Minimal dependent subsegmental atelectasis in both lungs. No focal consolidation, pleural effusion, or pneumothorax. No suspicious pulmonary nodule. Musculoskeletal: No acute or significant osseous findings. CT ABDOMEN PELVIS FINDINGS Hepatobiliary: Unchanged scattered small hepatic cysts. Unchanged small gallstones. No gallbladder wall thickening or biliary dilatation. Pancreas: Unremarkable. No pancreatic ductal dilatation or surrounding inflammatory changes. Spleen: Normal in size without focal abnormality. Adrenals/Urinary Tract: The adrenal glands are unremarkable. Unchanged simple cyst in the lower pole of the left kidney. Other subcentimeter low-density lesions in  both kidneys are too small to characterize. Previously noted small bilateral renal calculi are obscured by excreted contrast. Unchanged 3 mm calculus in the mid to distal right ureter. Excreted contrast within the bladder. Stomach/Bowel: Stomach is within normal limits. Appendix appears normal. No evidence of bowel wall thickening, distention, or inflammatory changes. Left-sided colonic diverticulosis. Vascular/Lymphatic: No significant vascular findings are present. No enlarged abdominal or pelvic lymph nodes. Reproductive: Tiny calcified uterine fibroid.  No adnexal mass. Other: Unchanged tiny fat containing umbilical hernia. No free fluid or pneumoperitoneum. Musculoskeletal: No acute or significant osseous findings. IMPRESSION: 1. No suspicious mass lesion or lymphadenopathy in the chest, abdomen, or pelvis. 2. Unchanged 3 mm calculus in the mid to distal right ureter. 3. Previously noted small bilateral renal calculi are obscured by excreted contrast. 4. 1.6 cm heterogeneously hypodense right thyroid nodule. Recommend thyroid US (ref: J Am Coll Radiol. 2015 Feb;12(2): 143-50). 5. Unchanged cholelithiasis. Electronically Signed   By: Obie Dredge M.D.   On: 09/05/2020 11:56   DG CHEST PORT 1 VIEW  Result Date: 09/04/2020 CLINICAL DATA:  Headache weakness EXAM: PORTABLE CHEST 1 VIEW COMPARISON:  None. FINDINGS: The heart size and mediastinal contours are within normal limits. Both lungs are clear. Partially visualized intramedullary rod in the left humerus. Mild diffuse bronchitic changes. IMPRESSION: Mild bronchitic changes without focal pneumonia. Electronically Signed   By: Jasmine Pang M.D.   On: 09/04/2020 22:11   CT Renal Stone Study  Result Date: 09/04/2020 CLINICAL DATA:  Hematuria EXAM: CT ABDOMEN AND PELVIS WITHOUT CONTRAST TECHNIQUE: Multidetector CT imaging of the abdomen and pelvis was performed following  the standard protocol without IV contrast. COMPARISON:  CT 01/27/2019 FINDINGS:  Lower chest: Lung bases demonstrate mild dependent atelectasis. Normal cardiac size. Hepatobiliary: Multiple calcified gallstones. No biliary dilatation. Stable probable hepatic cyst in the left hepatic lobe Pancreas: Unremarkable. No pancreatic ductal dilatation or surrounding inflammatory changes. Spleen: Normal in size without focal abnormality. Adrenals/Urinary Tract: Adrenal glands are normal. Cyst off the lower pole left kidney. 3 mm stone in the midpole of the left kidney. 2 mm stone in the mid right kidney. Mild right renal pelvis dilatation without significant hydroureter, there is a 3 mm stone in the mid to distal right ureter at the level of the upper sacrum. The urinary bladder is unremarkable. Stomach/Bowel: The stomach is nonenlarged. No dilated small bowel. Negative appendix. Possible mild wall thickening of the distal descending/proximal sigmoid colon. Scattered left colon diverticula Vascular/Lymphatic: Nonaneurysmal aorta.  No suspicious nodes Reproductive: Probable small calcified uterine fibroid. No adnexal mass Other: Negative for free air or free fluid Musculoskeletal: No acute or significant osseous findings. IMPRESSION: 1. Mild right renal pelvis dilatation without significant hydroureter, secondary to a 3 mm stone in the mid to distal right ureter at the level of the upper sacrum. 2. Small intrarenal stones bilaterally. 3. Possible mild wall thickening of the distal descending/proximal sigmoid colon, could reflect a mild colitis of infectious, inflammatory, or ischemic etiology. 4. Gallstones. Electronically Signed   By: Donavan Foil M.D.   On: 09/04/2020 19:16    Microbiology: Recent Results (from the past 240 hour(s))  MRSA PCR Screening     Status: None   Collection Time: 09/10/20  5:18 PM   Specimen: Nasal Mucosa; Nasopharyngeal  Result Value Ref Range Status   MRSA by PCR NEGATIVE NEGATIVE Final    Comment:        The GeneXpert MRSA Assay (FDA approved for NASAL  specimens only), is one component of a comprehensive MRSA colonization surveillance program. It is not intended to diagnose MRSA infection nor to guide or monitor treatment for MRSA infections. Performed at [facility], [address], [place], [place] [address]      Labs: Basic Metabolic Panel: Recent Labs  Lab 09/11/20 1409 09/11/20 1503 09/12/20 0534 09/14/20 0242 09/16/20 0434  NA 138 139 139 137 137  K 4.4 4.4 4.6 4.4 4.0  CL  --   --  112* 109 111  CO2  --   --  21* 20* 19*  GLUCOSE  --   --  123* 142* 110*  BUN  --   --  30* 25* 22  CREATININE  --   --  1.15* 0.90 0.93  CALCIUM  --   --  7.0* 7.2* 7.3*   Liver Function Tests: No results for input(s): AST, ALT, ALKPHOS, BILITOT, PROT, ALBUMIN in the last 168 hours. No results for input(s): LIPASE, AMYLASE in the last 168 hours. No results for input(s): AMMONIA in the last 168 hours. CBC: Recent Labs  Lab 09/11/20 1409 09/11/20 1503 09/12/20 0534 09/14/20 0242 09/16/20 0434  WBC  --   --  11.3* 8.8 8.6  NEUTROABS  --   --  9.9*  --   --   HGB 9.5* 8.8* 9.6* 8.6* 8.5*  HCT 28.0* 26.0* 31.4* 28.6* 27.3*  MCV  --   --  80.1 79.7* 79.6*  PLT  --   --  153 137* 146*   Cardiac Enzymes: No results for input(s): CKTOTAL, CKMB, CKMBINDEX, TROPONINI in the last 168 hours. BNP: BNP (  last 3 results) No results for input(s): BNP in the last 8760 hours.  ProBNP (last 3 results) No results for input(s): PROBNP in the last 8760 hours.  CBG: No results for input(s): GLUCAP in the last 168 hours.     Signed:  Annita Brod, MD [facility] 09/17/2020, 1:01 PM

## 2020-09-17 NOTE — H&P (Signed)
Physical Medicine and Rehabilitation Admission H&P    Chief Complaint  Patient presents with  . 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  History taken from chart review and patient.  Patient lives alone in a mobile home with multiple steps to entry.  She has multiple family members that live close by that check on her routinely.  Reportedly independent prior to admission.  She presented on 09/04/2020 with progressive by lower extremity weakness as well as headaches x2 months.  Family also reporting episodes of forgetfulness and confusion.  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  There was a leftward midline shift with subfalcine herniation of the right cingulate gyrus with early entrapment of the left lateral ventricle.  CT renal study completed for some hematuria that showed mild renal pelvis dilatation without significant hydroureter secondary to 3 mm stone in the mid to distal right ureter at the level of the upper sacrum of which urology service Dr. Nolen Mu was consulted recommended course of antibiotics which she has since completed..  Patient underwent right pterional craniotomy for resection of anterior skull base mass on 09/11/2020 per Dr. Hoyt Koch.  Decadron protocol as indicated.  Keppra for seizure prophylaxis.  Subcutaneous Lovenox for DVT prophylaxis.  Physical medicine rehabilitation was consulted to assess candidacy for CIR for impaired mobility and ADLs.  She was admitted for a comprehensive rehab program.  Please agree admission assessment while it is well.  Review of Systems  Constitutional: Negative for chills and fever.  HENT: Negative for hearing loss.   Eyes: Negative for blurred vision and double vision.  Respiratory: Negative  for cough and shortness of breath.   Cardiovascular: Negative for chest pain and palpitations.  Gastrointestinal: Positive for constipation. Negative for heartburn, nausea and vomiting.  Genitourinary: Positive for dysuria and hematuria.  Musculoskeletal: Positive for joint pain and myalgias.  Skin: Negative for rash.  Neurological: Positive for weakness and headaches. Negative for sensory change and speech change.  Psychiatric/Behavioral: Positive for depression and memory loss.  All other systems reviewed and are negative.  Past Medical History:  Diagnosis Date  . Arthritis   . Basal cell carcinoma   . 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 Cancer (HCC)    melanoma  . Dysuria   . Frequency of urination   . Hematuria   . History of kidney stones   . Osteoporosis   . Renal calculus, bilateral   . 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 Urgency of urination   . Wears glasses    Past Surgical History:  Procedure Laterality Date  . APPLICATION OF CRANIAL NAVIGATION Right 09/11/2020   Procedure: APPLICATION OF CRANIAL NAVIGATION;  Surgeon: Bedelia Person, MD;  Location: [facility] OR;  Service: Neurosurgery;  Laterality: Right;  . CRANIOTOMY Right 09/11/2020   Procedure: Right Pterional Craniotomy for resection of anterior skull base mass with Brain Lab;  Surgeon: Bedelia Person, MD;  Location: [facility] OR;  Service: Neurosurgery;  Laterality: Right;  . CYSTOSCOPY WITH RETROGRADE PYELOGRAM, URETEROSCOPY AND STENT PLACEMENT Bilateral 08/11/2013   Procedure: CYSTOSCOPY WITH RETROGRADE PYELOGRAM, URETEROSCOPY AND STENT PLACEMENT;  Surgeon: Sebastian Ache, MD;  Location: [facility] ORS;  Service: Urology;  Laterality:  Bilateral;  . CYSTOSCOPY WITH URETEROSCOPY AND STENT PLACEMENT Bilateral 08/29/2013   Procedure: CYSTOSCOPY WITH BILATERAL URETEROSCOPY AND STENT REPLACEMENTS, stone extraction;  Surgeon: Alexis Frock, MD;  Location: [facility];   Service: Urology;  Laterality: Bilateral;  . CYSTOSCOPY/RETROGRADE/URETEROSCOPY/STONE EXTRACTION WITH BASKET Bilateral 01/31/2016   Procedure: CYSTOSCOPY/BILATERAL RETROGRADE PYELOGRAMS/BILATERAL URETEROSCOPY/BILATERAL STONE EXTRACTION WITH BASKET/RIGHT URETERAL STENT PLACEMENT;  Surgeon: Raynelle Bring, MD;  Location: [facility] ORS;  Service: Urology;  Laterality: Bilateral;  . HEMIARTHROPLASTY SHOULDER FRACTURE Left 1992  . HOLMIUM LASER APPLICATION Bilateral [id number]   Procedure: HOLMIUM LASER APPLICATION;  Surgeon: Alexis Frock, MD;  Location: [facility];  Service: Urology;  Laterality: Bilateral;   Family History  Problem Relation Age of Onset  . Cancer Mother        unknown source, stomach and liver   . Atrial fibrillation Father   . Hyperlipidemia Father   . Hypertension Father   . Colon polyps Father   . Hypertension Sister   . Hyperlipidemia Sister   . Thyroid disease Sister   . Esophageal cancer Neg Hx   . Pancreatic cancer Neg Hx    Social History:  reports that she has never smoked. She has never used smokeless tobacco. She reports that she does not drink alcohol and does not use drugs. Allergies:  Allergies  Allergen Reactions  . Sulfa Antibiotics Rash   Medications Prior to Admission  Medication Sig Dispense Refill  . alendronate (FOSAMAX) 70 MG tablet Take 1 tablet (70 mg total) by mouth every 7 (seven) days. Reported on 10/30/2015 (Needs to be seen before next refill) 4 tablet 0  . atorvastatin (LIPITOR) 20 MG tablet TAKE 1 TABLET BY MOUTH EVERY DAY 30 tablet 0  . Calcium Carbonate-Vit D-Min (CALCIUM 1200 PO) Take 1 tablet by mouth daily.    Marland Kitchen escitalopram (LEXAPRO) 10 MG tablet TAKE 1 TABLET BY MOUTH EVERY DAY 90 tablet 1  . ferrous sulfate 325 (65 FE) MG tablet Take 325 mg by mouth daily with breakfast.    . Glycerin-Hypromellose-PEG 400 0.2-0.2-1 % SOLN Apply 1 drop to eye 2 (two) times daily as needed (dry eyes).     Marland Kitchen ibuprofen (ADVIL,MOTRIN) 200 MG  tablet Take 200-400 mg by mouth every 6 (six) hours as needed for moderate pain.    Marland Kitchen loratadine (CLARITIN) 10 MG tablet Take 10 mg by mouth daily as needed for allergies.    . Multiple Vitamin (MULTIVITAMIN WITH MINERALS) TABS tablet Take 1 tablet by mouth daily.      Drug Regimen Review Drug regimen was reviewed and remains appropriate with no significant issues identified  Home: Home Living Family/patient expects to be discharged to:: Private residence Living Arrangements: Alone Available Help at Discharge: Family,Available 24 hours/day Type of Home: Mobile home Home Access: Stairs to enter Entrance Stairs-Number of Steps: 9 Entrance Stairs-Rails: Right,Left Bathroom Shower/Tub: Multimedia programmer: Handicapped height Home Equipment: None Additional Comments: Many family member live adjacent to her, sister does not work and can assist  Lives With: Alone   Functional History: Prior Function Level of Independence: Independent Comments: drove and completed her ADL's PTA  Functional Status:  Mobility: Bed Mobility Overal bed mobility: Needs Assistance Bed Mobility: Supine to Sit Supine to sit: Min guard General bed mobility comments: Min guard for safety +rail with HOB flat to simulate home environment. Transfers Overall transfer level: Needs assistance Equipment used: Rolling walker (2 wheeled) Transfers: Sit to/from Stand Sit to Stand: Min guard,Min assist Stand pivot transfers: Mod  assist,+2 safety/equipment,Min assist,+2 physical assistance General transfer comment: Min guard from elevated surfaces and Min A from low surfaces. Cues for hand placement. Ambulation/Gait Ambulation/Gait assistance: Min assist Gait Distance (Feet): 40 Feet Assistive device: Rolling walker (2 wheeled) Gait Pattern/deviations: Trunk flexed,Decreased stride length General Gait Details: vc's for posture, unsteady gait, narrow BOS, decreased pace, has difficulty with navigation  around obstacles Gait velocity: decreased Gait velocity interpretation: <1.31 ft/sec, indicative of household ambulator    ADL: ADL Overall ADL's : Needs assistance/impaired Eating/Feeding: Set up,Sitting Grooming: Wash/dry hands,Wash/dry face,Oral care,Brushing hair,Min guard,Standing Grooming Details (indicate cue type and reason): Patient completed 3/3 grooming tasks standing at sink level with use of RW. Min guard for safety. Upper Body Bathing: Min guard,Sitting Lower Body Bathing: Maximal assistance,Sit to/from stand Upper Body Dressing : Minimal assistance,Sitting Lower Body Dressing: Moderate assistance,Sit to/from stand Toilet Transfer: Min Copy Details (indicate cue type and reason): Min guard for safety with use of RW. Good safety awareness. Toileting- Water quality scientist and Hygiene: Min guard,Sit to/from stand Toileting - Water quality scientist Details (indicate cue type and reason): Patient able to complete 3/3 parts of toileting tasks in sitting/standing with Min guard for safety. Functional mobility during ADLs: Min guard,Rolling walker General ADL Comments: pt noted to have drainage and odor on posterior back bandage. pt with new bandage applied this session. pt and daughter thanking staff  Cognition: Cognition Overall Cognitive Status: Within Functional Limits for tasks assessed Arousal/Alertness: Awake/alert Orientation Level: Oriented X4 Attention: Sustained Sustained Attention: Appears intact Memory: Impaired Memory Impairment: Retrieval deficit (4/5) Awareness: Impaired Awareness Impairment: Intellectual impairment Problem Solving: Impaired Problem Solving Impairment: Verbal basic Executive Function: Self Monitoring,Self Correcting Safety/Judgment: Impaired Cognition Arousal/Alertness: Awake/alert Behavior During Therapy: WFL for tasks assessed/performed Overall Cognitive Status: Within Functional Limits for tasks assessed Area of  Impairment: Attention,Problem solving Current Attention Level: Alternating Problem Solving: Requires verbal cues,Difficulty sequencing,Slow processing General Comments: delayed processing, STM deficits  Physical Exam: Blood pressure (!) 145/75, pulse (!) 59, temperature 98 F (36.7 C), temperature source Oral, resp. rate 16, height 5\' 2"  (1.575 m), weight 63.9 kg, SpO2 98 %. Physical Exam Vitals reviewed.  Constitutional:      General: She is not in acute distress.    Appearance: She is obese.  HENT:     Head:     Comments: Right craniotomy site clean and dry    Right Ear: External ear normal.     Left Ear: External ear normal.     Nose: Nose normal.  Eyes:     General:        Right eye: No discharge.        Left eye: No discharge.     Extraocular Movements: Extraocular movements intact.  Cardiovascular:     Rate and Rhythm: Normal rate and regular rhythm.  Pulmonary:     Effort: Pulmonary effort is normal. No respiratory distress.     Breath sounds: No stridor.  Abdominal:     General: Abdomen is flat. Bowel sounds are normal. There is no distension.  Musculoskeletal:     Cervical back: Normal range of motion and neck supple.     Comments: Bilateral lower extremity edema No tenderness in extremities  Skin:    General: Skin is warm and dry.     Comments: Staples to right craniotomy CDI  Neurological:     Mental Status: She is alert.     Comments: Alert  Makes eye contact with examiner.   Provides her name and age  with some mild delay in processing.   Follows commands. Motor: RUE: 4+/5 proximal distal RLE: 4/5 proximal distally in LUE: Shoulder abduction 2+/5 (baseline), distally 4/5 Left lower extremity: 4/5 proximal distal  Psychiatric:        Mood and Affect: Mood normal.        Behavior: Behavior normal.     Results for orders placed or performed during the hospital encounter of 09/04/20 (from the past 48 hour(s))  Basic metabolic panel     Status: Abnormal    Collection Time: 09/16/20  4:34 AM  Result Value Ref Range   Sodium 137 135 - 145 mmol/L   Potassium 4.0 3.5 - 5.1 mmol/L   Chloride 111 98 - 111 mmol/L   CO2 19 (L) 22 - 32 mmol/L   Glucose, Bld 110 (H) 70 - 99 mg/dL    Comment: Glucose reference range applies only to samples taken after fasting for at least 8 hours.   BUN 22 8 - 23 mg/dL   Creatinine, Ser 0.93 0.44 - 1.00 mg/dL   Calcium 7.3 (L) 8.9 - 10.3 mg/dL   GFR, Estimated >60 >60 mL/min    Comment: (NOTE) Calculated using the CKD-EPI Creatinine Equation (2021)    Anion gap 7 5 - 15    Comment: Performed at [facility] [address], [place], [place] [address]  CBC     Status: Abnormal   Collection Time: 09/16/20  4:34 AM  Result Value Ref Range   WBC 8.6 4.0 - 10.5 K/uL   RBC 3.43 (L) 3.87 - 5.11 MIL/uL   Hemoglobin 8.5 (L) 12.0 - 15.0 g/dL   HCT 27.3 (L) 36.0 - 46.0 %   MCV 79.6 (L) 80.0 - 100.0 fL   MCH 24.8 (L) 26.0 - 34.0 pg   MCHC 31.1 30.0 - 36.0 g/dL   RDW 21.2 (H) 11.5 - 15.5 %   Platelets 146 (L) 150 - 400 K/uL    Comment: REPEATED TO VERIFY   nRBC 0.0 0.0 - 0.2 %    Comment: Performed at [facility], [place] [address], [place], [place] [address]   No results found.     Medical Problem List and Plan: 1.  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  Follow-up Dr. Duffy Rhody.  Decadron protocol as indicated  -patient may not shower  -ELOS/Goals: 10-15 days/supervision/mod I  Admit to CIR 2.  Antithrombotics: -DVT/anticoagulation: Lovenox  -antiplatelet therapy: N/A 3. Pain Management: Hydrocodone as needed  Monitor, particularly for headaches with increased exertion 4. Mood: Lexapro 10 mg daily  -antipsychotic agents: N/A 5. Neuropsych: This patient is capable of making decisions on her own behalf. 6. Skin/Wound Care: Routine skin  checks 7. Fluids/Electrolytes/Nutrition: Routine in and outs  CMP ordered for tomorrow a.m.  8.  Seizure prophylaxis.  Keppra 500 mg twice daily 9.  Hypertension.  Hydralazine 10 mg every 8 hours.   Monitor increase mobility 10.  History of renal stones.  Follow-up outpatient urology services 11.  Hyperlipidemia: Lipitor 12.  Constipation.  Colace 100 mg twice daily, MiraLAX as needed 13.  Iron deficiency anemia.  Continue iron supplement  CBC ordered for tomorrow a.m.  Lavon Paganini Angiulli, PA-C 09/17/2020   I have personally performed a face to face diagnostic evaluation, including, but not limited to relevant history and physical exam findings, of this patient and developed  relevant assessment and plan.  Additionally, I have reviewed and concur with the physician assistant's documentation above.  Maryla Morrow, MD, ABPMR  The patient's status has not changed. Any changes from the pre-admission screening or documentation from the acute chart are noted above.   Maryla Morrow, MD, ABPMR

## 2020-09-18 ENCOUNTER — Inpatient Hospital Stay (HOSPITAL_COMMUNITY): Payer: Medicare Other | Admitting: Occupational Therapy

## 2020-09-18 ENCOUNTER — Inpatient Hospital Stay (HOSPITAL_COMMUNITY): Payer: Medicare Other | Admitting: Speech Pathology

## 2020-09-18 ENCOUNTER — Inpatient Hospital Stay (HOSPITAL_COMMUNITY): Payer: Medicare Other | Admitting: Physical Therapy

## 2020-09-18 DIAGNOSIS — D329 Benign neoplasm of meninges, unspecified: Secondary | ICD-10-CM

## 2020-09-18 LAB — COMPREHENSIVE METABOLIC PANEL
ALT: 45 U/L — ABNORMAL HIGH (ref 0–44)
AST: 58 U/L — ABNORMAL HIGH (ref 15–41)
Albumin: 2.5 g/dL — ABNORMAL LOW (ref 3.5–5.0)
Alkaline Phosphatase: 51 U/L (ref 38–126)
Anion gap: 7 (ref 5–15)
BUN: 13 mg/dL (ref 8–23)
CO2: 23 mmol/L (ref 22–32)
Calcium: 7.7 mg/dL — ABNORMAL LOW (ref 8.9–10.3)
Chloride: 107 mmol/L (ref 98–111)
Creatinine, Ser: 0.94 mg/dL (ref 0.44–1.00)
GFR, Estimated: >60 mL/min (ref >60)
Glucose, Bld: 98 mg/dL (ref 70–99)
Potassium: 4.1 mmol/L (ref 3.5–5.1)
Sodium: 137 mmol/L (ref 135–145)
Total Bilirubin: 0.6 mg/dL (ref 0.3–1.2)
Total Protein: 5 g/dL — ABNORMAL LOW (ref 6.5–8.1)

## 2020-09-18 LAB — CBC WITH DIFFERENTIAL/PLATELET
Abs Immature Granulocytes: 0.08 10*3/uL — ABNORMAL HIGH (ref 0.00–0.07)
Basophils Absolute: 0 10*3/uL (ref 0.0–0.1)
Basophils Relative: 0 %
Eosinophils Absolute: 0 10*3/uL (ref 0.0–0.5)
Eosinophils Relative: 0 %
HCT: 28.2 % — ABNORMAL LOW (ref 36.0–46.0)
Hemoglobin: 8.8 g/dL — ABNORMAL LOW (ref 12.0–15.0)
Immature Granulocytes: 1 %
Lymphocytes Relative: 9 %
Lymphs Abs: 0.7 10*3/uL (ref 0.7–4.0)
MCH: 25 pg — ABNORMAL LOW (ref 26.0–34.0)
MCHC: 31.2 g/dL (ref 30.0–36.0)
MCV: 80.1 fL (ref 80.0–100.0)
Monocytes Absolute: 0.6 10*3/uL (ref 0.1–1.0)
Monocytes Relative: 7 %
Neutro Abs: 6.8 10*3/uL (ref 1.7–7.7)
Neutrophils Relative %: 83 %
Platelets: 163 10*3/uL (ref 150–400)
RBC: 3.52 MIL/uL — ABNORMAL LOW (ref 3.87–5.11)
RDW: 21.3 % — ABNORMAL HIGH (ref 11.5–15.5)
WBC: 8.2 10*3/uL (ref 4.0–10.5)
nRBC: 0 % (ref 0.0–0.2)

## 2020-09-18 MED ORDER — COVID-19 MRNA VACC (MODERNA) 50 MCG/0.25ML IM SUSP
0.2500 mL | Freq: Once | INTRAMUSCULAR | Status: DC
  Filled 2020-09-18: qty 0.25

## 2020-09-18 MED ORDER — LEVETIRACETAM 100 MG/ML PO SOLN
500.0000 mg | Freq: Two times a day (BID) | ORAL | Status: DC
  Administered 2020-09-18 – 2020-09-25 (×14): 500 mg via ORAL
  Filled 2020-09-18 (×15): qty 5

## 2020-09-18 MED ORDER — HYDRALAZINE HCL 50 MG PO TABS
50.0000 mg | ORAL_TABLET | Freq: Three times a day (TID) | ORAL | Status: DC
Start: 2020-09-18 — End: 2020-09-25
  Administered 2020-09-18 – 2020-09-25 (×21): 50 mg via ORAL
  Filled 2020-09-18 (×22): qty 1

## 2020-09-18 MED ORDER — ESCITALOPRAM OXALATE 10 MG PO TABS: 10.0000 mg | ORAL_TABLET | Freq: Every day | ORAL | Status: DC

## 2020-09-18 MED ORDER — ESCITALOPRAM OXALATE 10 MG PO TABS
10.0000 mg | ORAL_TABLET | Freq: Every day | ORAL | Status: DC
  Administered 2020-09-18 – 2020-09-24 (×7): 10 mg via ORAL
  Filled 2020-09-18 (×7): qty 1

## 2020-09-18 NOTE — Progress Notes (Signed)
[facility] PHYSICAL MEDICINE & REHABILITATION PROGRESS NOTE   Subjective/Complaints: [name] Page has no complaints this morning.  She is working with Toni Amend SLP who notes mild short term memory deficits Denies headache.  ROS: Denies headache.  Objective:   No results found. Recent Labs    09/16/20 0434 09/18/20 0454  WBC 8.6 8.2  HGB 8.5* 8.8*  HCT 27.3* 28.2*  PLT 146* 163   Recent Labs    09/16/20 0434 09/18/20 0454  NA 137 137  K 4.0 4.1  CL 111 107  CO2 19* 23  GLUCOSE 110* 98  BUN 22 13  CREATININE 0.93 0.94  CALCIUM 7.3* 7.7*    Intake/Output Summary (Last 24 hours) at 09/18/2020 0906 Last data filed at 09/18/2020 0700 Gross per 24 hour  Intake 560 ml  Output --  Net 560 ml        Physical Exam: Vital Signs Blood pressure (!) 156/66, pulse 71, temperature 98 F (36.7 C), temperature source Oral, resp. rate 16, height 5\' 2"  (1.575 m), weight 72.8 kg, SpO2 98 %. Gen: no distress, normal appearing HEENT: oral mucosa pink and moist, NCAT Cardio: Reg rate Chest: normal effort, normal rate of breathing Abd: soft, non-distended Ext: no edema Skin: intact Musculoskeletal:     Cervical back: Normal range of motion and neck supple.     Comments: Bilateral lower extremity edema No tenderness in extremities  Skin:    General: Skin is warm and dry.     Comments: Staples to right craniotomy CDI  Neurological:     Mental Status: She is alert.     Comments: Alert  Makes eye contact with examiner.   Provides her name and age with some mild delay in processing.   Follows commands. Motor: RUE: 4+/5 proximal distal RLE: 4/5 proximal distally in LUE: Shoulder abduction 2+/5 (baseline), distally 4/5 Left lower extremity: 4/5 proximal distal  Psychiatric:        Mood and Affect: Mood normal.        Behavior: Behavior normal.    Assessment/Plan: 1. Functional deficits which require 3+ hours per day of interdisciplinary therapy in a comprehensive  inpatient rehab setting.  Physiatrist is providing close team supervision and 24 hour management of active medical problems listed below.  Physiatrist and rehab team continue to assess barriers to discharge/monitor patient progress toward functional and medical goals  Care Tool:  Bathing              Bathing assist       Upper Body Dressing/Undressing Upper body dressing   What is the patient wearing?: Hospital gown only    Upper body assist Assist Level: Supervision/Verbal cueing    Lower Body Dressing/Undressing Lower body dressing      What is the patient wearing?: Underwear/pull up     Lower body assist Assist for lower body dressing: Independent     Toileting Toileting    Toileting assist Assist for toileting: Moderate Assistance - Patient 50 - 74%     Transfers Chair/bed transfer  Transfers assist     Chair/bed transfer assist level: Moderate Assistance - Patient 50 - 74%     Locomotion Ambulation   Ambulation assist              Walk 10 feet activity   Assist           Walk 50 feet activity   Assist           Walk 150 feet activity  Assist           Walk 10 feet on uneven surface  activity   Assist           Wheelchair     Assist               Wheelchair 50 feet with 2 turns activity    Assist            Wheelchair 150 feet activity     Assist          Blood pressure (!) 156/66, pulse 71, temperature 98 F (36.7 C), temperature source Oral, resp. rate 16, height 5\' 2"  (1.575 m), weight 72.8 kg, SpO2 98 %.     Medical Problem List and Plan: 1.  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  Follow-up Dr. Duffy Rhody.  Decadron protocol as indicated             -patient may not shower             -ELOS/Goals: 10-15  days/supervision/mod I             Initial CIR evaluations today.  2.  Antithrombotics: -DVT/anticoagulation: Lovenox. Cr reviewed 12/30 and is stable. Continue to monitor weekly.              -antiplatelet therapy: N/A 3. Pain Management: Hydrocodone as needed             Monitor, particularly for headaches with increased exertion 4. Mood: Lexapro 10 mg daily- change to nightly as she takes at home, feels it helps her relax at night.              -antipsychotic agents: N/A 5. Neuropsych: This patient is capable of making decisions on her own behalf. 6. Skin/Wound Care: Routine skin checks 7. Fluids/Electrolytes/Nutrition: Routine in and outs             CMP ordered for tomorrow a.m.  8.  Seizure prophylaxis.  Keppra 500 mg twice daily 9.  Hypertension.  Hydralazine 10 mg every 8 hours.    [id number]: Systolic BP uncontrolled to 150s. Increase Hydralazine to 50mg              Monitor increase mobility 10.  History of renal stones.  Follow-up outpatient urology services 11.  Hyperlipidemia: Lipitor 12.  Constipation.  Colace 100 mg twice daily, MiraLAX as needed 13.  Iron deficiency anemia.  Continue iron supplement             CBC stable  LOS: 1 days A FACE TO FACE EVALUATION WAS PERFORMED  Eastyn Dattilo P Kaikoa Magro 09/18/2020, 9:06 AM

## 2020-09-18 NOTE — Plan of Care (Signed)
  Problem: RH Balance Goal: LTG: Patient will maintain dynamic sitting balance (OT) Description: LTG:  Patient will maintain dynamic sitting balance with assistance during activities of daily living (OT) Flowsheets (Taken 09/18/2020 0957) LTG: Pt will maintain dynamic sitting balance during ADLs with: Independent Goal: LTG Patient will maintain dynamic standing with ADLs (OT) Description: LTG:  Patient will maintain dynamic standing balance with assist during activities of daily living (OT)  Flowsheets (Taken 09/18/2020 0957) LTG: Pt will maintain dynamic standing balance during ADLs with: Supervision/Verbal cueing   Problem: Sit to Stand Goal: LTG:  Patient will perform sit to stand in prep for activites of daily living with assistance level (OT) Description: LTG:  Patient will perform sit to stand in prep for activites of daily living with assistance level (OT) Flowsheets (Taken 09/18/2020 0957) LTG: PT will perform sit to stand in prep for activites of daily living with assistance level: Supervision/Verbal cueing   Problem: RH Eating Goal: LTG Patient will perform eating w/assist, cues/equip (OT) Description: LTG: Patient will perform eating with assist, with/without cues using equipment (OT) Flowsheets (Taken 09/18/2020 0957) LTG: Pt will perform eating with assistance level of: Independent with assistive device    Problem: RH Grooming Goal: LTG Patient will perform grooming w/assist,cues/equip (OT) Description: LTG: Patient will perform grooming with assist, with/without cues using equipment (OT) Flowsheets (Taken 09/18/2020 0957) LTG: Pt will perform grooming with assistance level of: Independent with assistive device    Problem: RH Bathing Goal: LTG Patient will bathe all body parts with assist levels (OT) Description: LTG: Patient will bathe all body parts with assist levels (OT) Flowsheets (Taken 09/18/2020 0957) LTG: Pt will perform bathing with assistance level/cueing:  Supervision/Verbal cueing   Problem: RH Dressing Goal: LTG Patient will perform upper body dressing (OT) Description: LTG Patient will perform upper body dressing with assist, with/without cues (OT). Flowsheets (Taken 09/18/2020 0957) LTG: Pt will perform upper body dressing with assistance level of: Supervision/Verbal cueing Goal: LTG Patient will perform lower body dressing w/assist (OT) Description: LTG: Patient will perform lower body dressing with assist, with/without cues in positioning using equipment (OT) Flowsheets (Taken 09/18/2020 0957) LTG: Pt will perform lower body dressing with assistance level of: Supervision/Verbal cueing   Problem: RH Toileting Goal: LTG Patient will perform toileting task (3/3 steps) with assistance level (OT) Description: LTG: Patient will perform toileting task (3/3 steps) with assistance level (OT)  Flowsheets (Taken 09/18/2020 0957) LTG: Pt will perform toileting task (3/3 steps) with assistance level: Supervision/Verbal cueing   Problem: RH Toilet Transfers Goal: LTG Patient will perform toilet transfers w/assist (OT) Description: LTG: Patient will perform toilet transfers with assist, with/without cues using equipment (OT) Flowsheets (Taken 09/18/2020 0957) LTG: Pt will perform toilet transfers with assistance level of: Supervision/Verbal cueing   Problem: RH Tub/Shower Transfers Goal: LTG Patient will perform tub/shower transfers w/assist (OT) Description: LTG: Patient will perform tub/shower transfers with assist, with/without cues using equipment (OT) Flowsheets (Taken 09/18/2020 0957) LTG: Pt will perform tub/shower stall transfers with assistance level of: Supervision/Verbal cueing

## 2020-09-18 NOTE — Evaluation (Signed)
Speech Language Pathology Assessment and Plan  Patient Details  Name: [name] Page MRN: [id number] Date of Birth: 1951-02-28  SLP Diagnosis: Cognitive Impairments  Rehab Potential: Excellent ELOS: 5-7 days    Today's Date: 09/18/2020 SLP Individual Time: 0786-7544 SLP Individual Time Calculation (min): 81 min   Hospital Problem: Principal Problem:   Meningioma [facility])  Past Medical History:  Past Medical History:  Diagnosis Date  . Arthritis   . Basal cell carcinoma   . 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 Cancer ([place])    melanoma  . Dysuria   . Frequency of urination   . Hematuria   . History of kidney stones   . Osteoporosis   . Renal calculus, bilateral   . 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 Urgency of urination   . Wears glasses    Past Surgical History:  Past Surgical History:  Procedure Laterality Date  . APPLICATION OF CRANIAL NAVIGATION Right 09/11/2020   Procedure: APPLICATION OF CRANIAL NAVIGATION;  Surgeon: Vallarie Mare, MD;  Location: [place];  Service: Neurosurgery;  Laterality: Right;  . CRANIOTOMY Right 09/11/2020   Procedure: Right Pterional Craniotomy for resection of anterior skull base mass with Brain Lab;  Surgeon: Vallarie Mare, MD;  Location: [place];  Service: Neurosurgery;  Laterality: Right;  . CYSTOSCOPY WITH RETROGRADE PYELOGRAM, URETEROSCOPY AND STENT PLACEMENT Bilateral 08/11/2013   Procedure: CYSTOSCOPY WITH RETROGRADE PYELOGRAM, URETEROSCOPY AND STENT PLACEMENT;  Surgeon: Alexis Frock, MD;  [facility];  Service: Urology;  Laterality: Bilateral;  . CYSTOSCOPY WITH URETEROSCOPY AND STENT PLACEMENT Bilateral 08/29/2013   Procedure: CYSTOSCOPY WITH BILATERAL URETEROSCOPY AND STENT REPLACEMENTS, stone extraction;  Surgeon: Alexis Frock, MD;  Location: [facility];  Service: Urology;  Laterality: Bilateral;  . CYSTOSCOPY/RETROGRADE/URETEROSCOPY/STONE EXTRACTION WITH  BASKET Bilateral 01/31/2016   Procedure: CYSTOSCOPY/BILATERAL RETROGRADE PYELOGRAMS/BILATERAL URETEROSCOPY/BILATERAL STONE EXTRACTION WITH BASKET/RIGHT URETERAL STENT PLACEMENT;  Surgeon: Raynelle Bring, MD;  [facility];  Service: Urology;  Laterality: Bilateral;  . HEMIARTHROPLASTY SHOULDER FRACTURE Left 1992  . 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 Presented 09/04/2020 with progressive weakness particularly involving bilateral lower extremities/headache x2 months as well as family reporting episodes of forgetfulness and confusion. 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 There was a leftward midline shift with subfalcine herniation of the right cingulate gyrus with early entrapment of the left lateral ventricle. CT renal stone study completed for some hematuria that showed mild right renal pelvis dilatation without significant hydroureter secondary to 3 mm stone in the mid to distal right ureter at the level of the upper sacrum. Patient underwent right pterionalcraniotomy for resection of anterior skull base mass 09/11/2020. Decadron protocol as indicated. Keppra for seizure prophylaxis. Subcutaneous Lovenox for DVT prophylaxis initiated 09/12/2020. Currently on mechanical soft diet. Physical Medicine & Rehabilitation was consulted to assess candidacy for CIRfor impaired mobility and ADLs.Patient admitted to [facility] 09/17/20.  SLP administered the [facility] Mental Status Examination (SLUMS) and patient scored  24/30 points with  a score of 27 or above considered  normal. SLP also administered portions of the cognistat (judgement and reasoning) in which she scored WFL. Mild deficits in short-term recall and complex problem solving was noted throughout evaluation. Patient reports mild deficits in word-finding as well that were not evident during structured tasks or informal conversation. Continued diagnostic treatment is needed. Patient would benefit from skilled SLP intervention to maximize her cognitive functioning and overall functional independence prior to discharge.    Skilled Therapeutic Interventions          Administered a cognitive-linguistic evaluation, please see above for details.   SLP Assessment  Patient will need skilled [place] Pathology Services during CIR admission    Recommendations  Oral Care Recommendations: Oral care BID Recommendations for Other Services: Neuropsych consult Patient destination: Home Follow up Recommendations:  (intermittent supervision) Equipment Recommended: None recommended by SLP    SLP Frequency 3 to 5 out of 7 days   SLP Duration  SLP Intensity  SLP Treatment/Interventions 5-7 days  Minumum of 1-2 x/day, 30 to 90 minutes  Cognitive remediation/compensation;Internal/external aids;Cueing hierarchy;Environmental controls;Therapeutic Activities;Patient/family education;Functional tasks    Pain No/Denies Pain   Prior Functioning Type of Home: Mobile home  Lives With: Alone Available Help at Discharge: Family;Available 24 hours/day Vocation: Retired  Programmer, systems Overall Cognitive Status: Impaired/Different from baseline Arousal/Alertness: Awake/alert Orientation Level: Oriented X4 Sustained Attention: Appears intact Memory: Impaired Memory Impairment: Retrieval deficit;Decreased short term memory;Decreased recall of new information Immediate Memory Recall: Sock;Blue;Bed Memory Recall Sock: Not able to recall Memory Recall Blue: Without  Cue Memory Recall Bed: Without Cue Awareness: Appears intact Problem Solving: Impaired Problem Solving Impairment: Functional complex Safety/Judgment: Appears intact  Comprehension Auditory Comprehension Overall Auditory Comprehension: Appears within functional limits for tasks assessed Expression Expression Primary Mode of Expression: Verbal Verbal Expression Overall Verbal Expression: Appears within functional limits for tasks assessed Oral Motor Oral Motor/Sensory Function Overall Oral Motor/Sensory Function: Within functional limits Motor Speech Overall Motor Speech: Appears within functional limits for tasks assessed  Care Tool Care Tool Cognition Expression of Ideas and Wants Expression of Ideas and Wants: Without difficulty (complex and basic) - expresses complex messages without difficulty and with speech that is clear and easy to understand   Understanding Verbal and Non-Verbal Content Understanding Verbal and Non-Verbal Content: Understands (complex and basic) - clear comprehension without cues or repetitions   Memory/Recall Ability *first 3 days only Memory/Recall Ability *first 3 days only: Location of own room;Staff names and faces;That he or she is in a hospital/hospital unit;Current season     Short Term Goals: Week 1: SLP Short Term Goal 1 (Week 1): STGs=LTGs due to ELOS  Refer to Care Plan for Long Term Goals  Recommendations for other services: Neuropsych  Discharge Criteria: Patient will be discharged from SLP if patient refuses treatment 3 consecutive times without medical reason, if treatment goals not met, if there is a change in medical status, if patient makes no progress towards goals or if patient is discharged from hospital.  The above assessment, treatment plan, treatment alternatives and goals were discussed and mutually agreed upon: by patient  Vinh Sachs 09/18/2020, 12:50 PM

## 2020-09-18 NOTE — Evaluation (Signed)
Physical Therapy Assessment and Plan  Patient Details  Name: [name] Page MRN: [id number] Date of Birth: 1951/06/29  PT Diagnosis: Abnormality of gait, Difficulty walking, Impaired cognition and Muscle weakness Rehab Potential: Good ELOS: 7-10 days   Today's Date: 09/18/2020 PT Individual Time: 1330-1430 PT Individual Time Calculation (min): 60 min    Hospital Problem: Principal Problem:   Meningioma [facility])   Past Medical History:  Past Medical History:  Diagnosis Date  . Arthritis   . Basal cell carcinoma   . 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 Cancer ([place])    melanoma  . Dysuria   . Frequency of urination   . Hematuria   . History of kidney stones   . Osteoporosis   . Renal calculus, bilateral   . 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 Urgency of urination   . Wears glasses    Past Surgical History:  Past Surgical History:  Procedure Laterality Date  . APPLICATION OF CRANIAL NAVIGATION Right 09/11/2020   Procedure: APPLICATION OF CRANIAL NAVIGATION;  Surgeon: Vallarie Mare, MD;  Location: [place];  Service: Neurosurgery;  Laterality: Right;  . CRANIOTOMY Right 09/11/2020   Procedure: Right Pterional Craniotomy for resection of anterior skull base mass with Brain Lab;  Surgeon: Vallarie Mare, MD;  Location: [place];  Service: Neurosurgery;  Laterality: Right;  . CYSTOSCOPY WITH RETROGRADE PYELOGRAM, URETEROSCOPY AND STENT PLACEMENT Bilateral 08/11/2013   Procedure: CYSTOSCOPY WITH RETROGRADE PYELOGRAM, URETEROSCOPY AND STENT PLACEMENT;  Surgeon: Alexis Frock, MD;  [facility];  Service: Urology;  Laterality: Bilateral;  . CYSTOSCOPY WITH URETEROSCOPY AND STENT PLACEMENT Bilateral 08/29/2013   Procedure: CYSTOSCOPY WITH BILATERAL URETEROSCOPY AND STENT REPLACEMENTS, stone extraction;  Surgeon: Alexis Frock, MD;  Location: [facility];  Service: Urology;  Laterality: Bilateral;  .  CYSTOSCOPY/RETROGRADE/URETEROSCOPY/STONE EXTRACTION WITH BASKET Bilateral 01/31/2016   Procedure: CYSTOSCOPY/BILATERAL RETROGRADE PYELOGRAMS/BILATERAL URETEROSCOPY/BILATERAL STONE EXTRACTION WITH BASKET/RIGHT URETERAL STENT PLACEMENT;  Surgeon: Raynelle Bring, MD;  [facility];  Service: Urology;  Laterality: Bilateral;  . HEMIARTHROPLASTY SHOULDER FRACTURE Left 1992  . 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  History taken from chart review and patient.  Patient lives alone in a mobile home with multiple steps to entry.  She has multiple family members that live close by that check on her routinely.  Reportedly independent prior to admission.  She presented on 09/04/2020 with progressive by lower extremity weakness as well as headaches x2 months.  Family also reporting episodes of forgetfulness and confusion.  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  There was a leftward midline shift with subfalcine herniation of the right cingulate gyrus with early entrapment of the left lateral ventricle.  CT renal study completed for some hematuria that showed mild renal pelvis dilatation without significant hydroureter secondary to 3 mm stone in the mid to distal right ureter at the level of the upper sacrum of which urology service Dr. Caprice Beaver was consulted recommended course of antibiotics which she has since completed..  Patient underwent  right pterional craniotomy for resection of anterior skull base mass on 09/11/2020 per Dr. Duffy Rhody.  Decadron  protocol as indicated.  Keppra for seizure prophylaxis.  Patient transferred to CIR on 09/17/2020 .   Patient currently requires min with mobility secondary to muscle weakness and muscle joint tightness, decreased cardiorespiratoy endurance, unbalanced muscle activation and decreased coordination and decreased standing balance, decreased postural control and decreased balance strategies.  Prior to hospitalization, patient was independent  with mobility and lived with Alone in a Mobile home home.  Home access is  Stairs to enter (has a friend building a ramp).  Patient will benefit from skilled PT intervention to maximize safe functional mobility, minimize fall risk and decrease caregiver burden for planned discharge home with intermittent assist.  Anticipate patient will benefit from follow up [facility] at discharge.  PT - End of Session Activity Tolerance: Tolerates 10 - 20 min activity with multiple rests Endurance Deficit: Yes PT Assessment Rehab Potential (ACUTE/IP ONLY): Good PT Barriers to Discharge: [place] home environment;Decreased caregiver support;Home environment access/layout;Pending chemo/radiation PT Patient demonstrates impairments in the following area(s): Balance;Behavior;Edema;Endurance;Motor;Safety PT Transfers Functional Problem(s): Bed Mobility;Bed to Chair;Car;Furniture;Floor PT Locomotion Functional Problem(s): Ambulation;Wheelchair Mobility;Stairs PT Plan PT Intensity: Minimum of 1-2 x/day ,45 to 90 minutes PT Frequency: 5 out of 7 days PT Duration Estimated Length of Stay: 7-10 days PT Treatment/Interventions: Ambulation/gait training;Discharge planning;Functional mobility training;Psychosocial support;Therapeutic Activities;Visual/perceptual remediation/compensation;Skin care/wound management;Wheelchair propulsion/positioning;Therapeutic Exercise;Neuromuscular re-education;Disease management/prevention;Balance/vestibular training;Cognitive remediation/compensation;Pain  management;DME/adaptive equipment instruction;Splinting/orthotics;UE/LE Strength taining/ROM;UE/LE Coordination activities;Patient/family education;Stair training;Functional electrical stimulation;Community reintegration PT Transfers Anticipated Outcome(s): Mod I with LRAD PT Locomotion Anticipated Outcome(s): Ambulatory with LRAD at Mod I level PT Recommendation Recommendations for Other Services: Therapeutic Recreation consult Therapeutic Recreation Interventions: Stress management;Outing/community reintergration Follow Up Recommendations: Home health PT Patient destination: Home Equipment Recommended: Rolling walker with 5" wheels;To be determined   PT Evaluation Precautions/Restrictions   fall  General   Vital SignsTherapy Vitals Temp: 98 F (36.7 C) Temp Source: Oral Pulse Rate: 65 Resp: 18 BP: (!) 167/71 Patient Position (if appropriate): Sitting Oxygen Therapy SpO2: 100 % O2 Device: Room Air Pain   denies Home Living/Prior Functioning Home Living Living Arrangements: Alone (Daughter and sister live next door) Vision/Perception    WFL Cognition Overall Cognitive Status: Impaired/Different from baseline Arousal/Alertness: Awake/alert Orientation Level: Oriented X4 Sustained Attention: Appears intact Memory: Impaired Memory Impairment: Retrieval deficit;Decreased short term memory;Decreased recall of new information Awareness: Appears intact Problem Solving: Impaired Problem Solving Impairment: Functional complex Safety/Judgment: Appears intact Sensation Sensation Light Touch: Appears Intact Hot/Cold: Appears Intact Proprioception: Appears Intact Additional Comments: resting tremmor all 4 extremities Coordination Gross Motor Movements are Fluid and Coordinated: No Fine Motor Movements are Fluid and Coordinated: No Coordination and Movement Description: decreased smoothness and accuracy with L hand due to prior shoulder replacement Finger Nose Finger Test: [facility]  on the R. Heel Shin Test: [facility] BLE Motor  Motor Motor: Other (comment) Motor - Skilled Clinical Observations: resting tremor BLE/BUE L>R.   Trunk/Postural Assessment  Cervical Assessment Cervical Assessment: Within Functional Limits Thoracic Assessment Thoracic Assessment: Within Functional Limits Lumbar Assessment Lumbar Assessment: Within Functional Limits Postural Control Postural Control: Within Functional Limits  Balance Balance Balance Assessed: Yes Standardized Balance Assessment Standardized Balance Assessment: Berg Balance Test Berg Balance Test Sit to Stand: Able to stand  independently using hands Standing Unsupported: Able to stand safely 2 minutes Sitting with Back Unsupported but Feet Supported on Floor or Stool: Able to sit safely and securely 2 minutes Stand to Sit: Controls descent  by using hands Transfers: Able to transfer safely, definite need of hands Standing Unsupported with Eyes Closed: Able to stand 10 seconds with supervision Standing Ubsupported with Feet Together: Able to place feet together independently and stand for 1 minute with supervision From Standing, Reach Forward with Outstretched Arm: Can reach forward >12 cm safely (5") From Standing Position, Pick up Object from Floor: Unable to pick up and needs supervision From Standing Position, Turn to Look Behind Over each Shoulder: Looks behind from both sides and weight shifts well Turn 360 Degrees: Needs close supervision or verbal cueing Standing Unsupported, Alternately Place Feet on Step/Stool: Able to complete >2 steps/needs minimal assist Standing Unsupported, One Foot in Front: Able to take small step independently and hold 30 seconds Standing on One Leg: Tries to lift leg/unable to hold 3 seconds but remains standing independently Total Score: 36 Static Sitting Balance Static Sitting - Balance Support: Feet supported Static Sitting - Level of Assistance: 6: Modified independent  (Device/Increase time) Dynamic Sitting Balance Dynamic Sitting - Balance Support: During functional activity;Feet supported Dynamic Sitting - Level of Assistance: 5: Stand by assistance Static Standing Balance Static Standing - Balance Support: During functional activity;No upper extremity supported Static Standing - Level of Assistance: 5: Stand by assistance Dynamic Standing Balance Dynamic Standing - Balance Support: During functional activity;No upper extremity supported Dynamic Standing - Level of Assistance: 4: Min assist Extremity Assessment      RLE Assessment RLE Assessment: Within Functional Limits Passive Range of Motion (PROM) Comments: resting tremor General Strength Comments: grossly 4+/5 to 5/5 proximal LLE Assessment LLE Assessment: Within Functional Limits Passive Range of Motion (PROM) Comments: resting tremor General Strength Comments: grossly 4+/5 to 5/5 proximal  Care Tool Care Tool Bed Mobility Roll left and right activity   Roll left and right assist level: Contact Guard/Touching assist    Sit to lying activity   Sit to lying assist level: Contact Guard/Touching assist    Lying to sitting edge of bed activity   Lying to sitting edge of bed assist level: Contact Guard/Touching assist     Care Tool Transfers Sit to stand transfer   Sit to stand assist level: Minimal Assistance - Patient > 75%    Chair/bed transfer   Chair/bed transfer assist level: Minimal Assistance - Patient > 75%     Toilet transfer   Assist Level: Minimal Assistance - Patient > 75%    Car transfer   Car transfer assist level: Minimal Assistance - Patient > 75%      Care Tool Locomotion Ambulation   Assist level: Minimal Assistance - Patient > 75% Assistive device: Hand held assist Max distance: 80  Walk 10 feet activity   Assist level: Minimal Assistance - Patient > 75% Assistive device: Hand held assist   Walk 50 feet with 2 turns activity   Assist level: Minimal  Assistance - Patient > 75% Assistive device: Hand held assist  Walk 150 feet activity Walk 150 feet activity did not occur: Safety/medical concerns      Walk 10 feet on uneven surfaces activity Walk 10 feet on uneven surfaces activity did not occur: Safety/medical concerns      Stairs   Assist level: Minimal Assistance - Patient > 75% Stairs assistive device: 2 hand rails Max number of stairs: 6  Walk up/down 1 step activity   Walk up/down 1 step (curb) assist level: Minimal Assistance - Patient > 75% Walk up/down 1 step or curb assistive device: 2 hand rails  Walk up/down 4 steps activity Walk up/down 4 steps assist level: Minimal Assistance - Patient > 75% Walk up/down 4 steps assistive device: 2 hand rails  Walk up/down 12 steps activity Walk up/down 12 steps activity did not occur: Safety/medical concerns      Pick up small objects from floor Pick up small object from the floor (from standing position) activity did not occur: Safety/medical concerns      Wheelchair   Type of Wheelchair: Manual   Wheelchair assist level: Supervision/Verbal cueing Max wheelchair distance: 27f  Wheel 50 feet with 2 turns activity   Assist Level: Supervision/Verbal cueing  Wheel 150 feet activity   Assist Level: Moderate Assistance - Patient 50 - 74%    Refer to Care Plan for Long Term Goals  SHORT TERM GOAL WEEK 1 PT Short Term Goal 1 (Week 1): STG=LTG due to ELOS  Recommendations for other services: Therapeutic Recreation  Stress management and Outing/community reintegration  Skilled Therapeutic Intervention  Pt received supine in bed and agreeable to PT. Supine>sit transfer with supervsion assist and cues for use of bed rails. PT instructed patient in PT Evaluation and initiated treatment intervention; see above for results. PT educated [place] in [place] re[place], rehab goals, and discharge recommendations. Patient demonstrates increased fall risk as noted by score of   36/56 on  Berg Balance Scale.  (<36= high risk for falls, close to 100%; 37-45 significant >80%; 46-51 moderate >50%; 52-55 lower >25%). Car transfer with min assist from PT for safety with cues for sit>pivot. Pt returned to room and performed stand pivot transfer to bed with supervision A . Sit>supine completed with supervision assist, and left supine in bed with call bell in reach and all needs met.     Mobility Bed Mobility Bed Mobility: Rolling Right;Rolling Left;Supine to Sit;Sit to Supine Rolling Right: Supervision/verbal cueing Rolling Left: Supervision/Verbal cueing Supine to Sit: Supervision/Verbal cueing Sit to Supine: Supervision/Verbal cueing Transfers Transfers: Sit to Stand;Stand Pivot Transfers;Stand to Sit Sit to Stand: Minimal Assistance - Patient > 75%;Contact Guard/Touching assist Stand to Sit: Minimal Assistance - Patient > 75%;Contact Guard/Touching assist Stand Pivot Transfers: Contact Guard/Touching assist Transfer (Assistive device): Rolling walker Locomotion  Gait Ambulation: Yes Gait Assistance: Minimal Assistance - Patient > 75% Gait Distance (Feet): 80 Feet Assistive device: 1 person hand held assist Gait Assistance Details: Verbal cues for technique;Verbal cues for gait pattern Gait Gait: Yes Gait Pattern: Impaired Gait Pattern: Poor foot clearance - left;Poor foot clearance - right;Step-through pattern;Decreased step length - left Stairs / Additional Locomotion Stairs: Yes Stairs Assistance: Minimal Assistance - Patient > 75% Stair Management Technique: Two rails Number of Stairs: 6 Height of Stairs: 3 Wheelchair Mobility Wheelchair Mobility: Yes Wheelchair Assistance: SChartered loss adjuster Both upper extremities Wheelchair Parts Management: Supervision/cueing Distance: 70   Discharge Criteria: Patient will be discharged from PT if patient refuses treatment 3 consecutive times without medical reason, if treatment goals not met,  if there is a change in medical status, if patient makes no progress towards goals or if patient is discharged from hospital.  The above assessment, treatment plan, treatment alternatives and goals were discussed and mutually agreed upon: by patient  ALorie Phenix12/30/2021, 2:56 PM

## 2020-09-18 NOTE — Evaluation (Signed)
Occupational Therapy Assessment and Plan  Patient Details  Name: [name] Page MRN: [id number] Date of Birth: 19-Aug-1951  OT Diagnosis: muscle weakness (generalized) and swelling of limb Rehab Potential: Rehab Potential (ACUTE ONLY): Excellent ELOS: 7-10   Today's Date: 09/18/2020 OT Individual Time: 1000-1115 OT Individual Time Calculation (min): 75 min     Hospital Problem: Principal Problem:   Meningioma [facility])   Past Medical History:  Past Medical History:  Diagnosis Date  . Arthritis   . Basal cell carcinoma   . 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 Cancer ([place])    melanoma  . Dysuria   . Frequency of urination   . Hematuria   . History of kidney stones   . Osteoporosis   . Renal calculus, bilateral   . 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 Urgency of urination   . Wears glasses    Past Surgical History:  Past Surgical History:  Procedure Laterality Date  . APPLICATION OF CRANIAL NAVIGATION Right 09/11/2020   Procedure: APPLICATION OF CRANIAL NAVIGATION;  Surgeon: Vallarie Mare, MD;  Location: [place];  Service: Neurosurgery;  Laterality: Right;  . CRANIOTOMY Right 09/11/2020   Procedure: Right Pterional Craniotomy for resection of anterior skull base mass with Brain Lab;  Surgeon: Vallarie Mare, MD;  Location: [place];  Service: Neurosurgery;  Laterality: Right;  . CYSTOSCOPY WITH RETROGRADE PYELOGRAM, URETEROSCOPY AND STENT PLACEMENT Bilateral 08/11/2013   Procedure: CYSTOSCOPY WITH RETROGRADE PYELOGRAM, URETEROSCOPY AND STENT PLACEMENT;  Surgeon: Alexis Frock, MD;  [facility];  Service: Urology;  Laterality: Bilateral;  . CYSTOSCOPY WITH URETEROSCOPY AND STENT PLACEMENT Bilateral 08/29/2013   Procedure: CYSTOSCOPY WITH BILATERAL URETEROSCOPY AND STENT REPLACEMENTS, stone extraction;  Surgeon: Alexis Frock, MD;  Location[facility];  Service: Urology;  Laterality: Bilateral;  .  CYSTOSCOPY/RETROGRADE/URETEROSCOPY/STONE EXTRACTION WITH BASKET Bilateral 01/31/2016   Procedure: CYSTOSCOPY/BILATERAL RETROGRADE PYELOGRAMS/BILATERAL URETEROSCOPY/BILATERAL STONE EXTRACTION WITH BASKET/RIGHT URETERAL STENT PLACEMENT;  Surgeon: Raynelle Bring, MD;  [facility];  Service: Urology;  Laterality: Bilateral;  . HEMIARTHROPLASTY SHOULDER FRACTURE Left 1992  . HOLMIUM LASER APPLICATION Bilateral 41/28/7867   Procedure: HOLMIUM LASER APPLICATION;  Surgeon: Alexis Frock, MD;  [facility];  Service: Urology;  Laterality: Bilateral;    Assessment & Plan Clinical Impression: Patient is a 69 y.o. 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 Admitted due to weakness, AMS. MRI revealed 3.3 x 4.1 x 2.4 cm meningioma at the skull base on the right. Mass-effect upon the inferior right frontal lobe. Underwent resection 12/23.  Patient transferred to CIR on 09/17/2020 .    Patient currently requires min with basic self-care skills secondary to muscle weakness, decreased cardiorespiratoy endurance and decreased sitting balance, decreased standing balance and decreased balance strategies.  Prior to hospitalization, patient could complete BADL with independent .  Patient will benefit from skilled intervention to increase independence with basic self-care skills prior to discharge home with care partner.  Anticipate patient will require intermittent supervision and follow up outpatient.  OT - End of Session Endurance Deficit: Yes Endurance Deficit Description: Rest break within BADL tasks OT Assessment Rehab Potential (ACUTE ONLY): Excellent OT Patient demonstrates impairments in the following area(s): Balance;Behavior;Cognition;Edema;Endurance;Motor;Pain;Perception;Sensory;Safety;Skin Integrity;Vision OT Basic ADL's Functional Problem(s):  Grooming;Eating;Bathing;Dressing;Toileting OT Transfers Functional Problem(s): Toilet;Tub/Shower OT Additional Impairment(s): Fuctional Use of Upper Extremity OT Plan OT  Intensity: Minimum of 1-2 x/day, 45 to 90 minutes OT Frequency: 5 out of 7 days OT Duration/Estimated Length of Stay: 7-10 OT Treatment/Interventions: Balance/vestibular training;Cognitive remediation/compensation;Community reintegration;Discharge planning;Disease mangement/prevention;DME/adaptive equipment instruction;Functional electrical stimulation;Functional mobility training;Neuromuscular re-education;Pain management;Patient/family education;Self Care/advanced ADL retraining;Wheelchair propulsion/positioning;Visual/perceptual remediation/compensation;UE/LE Coordination activities;UE/LE Strength taining/ROM;Therapeutic Exercise;Therapeutic Activities;Psychosocial support;Skin care/wound managment;Splinting/orthotics OT Self Feeding Anticipated Outcome(s): Mod I OT Basic Self-Care Anticipated Outcome(s): Mod I/supervision OT Toileting Anticipated Outcome(s): Supervision OT Bathroom Transfers Anticipated Outcome(s): Supervision OT Recommendation Patient destination: Home Follow Up Recommendations: Outpatient OT Equipment Recommended: To be determined   OT Evaluation Precautions/Restrictions  Precautions Precautions: Fall Restrictions Weight Bearing Restrictions: No Pain  denies pain Home Living/Prior Functioning Home Living Family/patient expects to be discharged to:: Private residence Living Arrangements: Alone (Daughter and sister live next door) Available Help at Discharge: Family,Available 24 hours/day Type of Home: Mobile home Home Access: Stairs to enter (has a friend building a ramp) Bathroom Shower/Tub: Walk-in shower Additional Comments: Many family members live adjacent to her, sister does not work and can assist  Lives With: Alone IADL History Homemaking Responsibilities: Yes Occupation:  Retired IADL Comments: Active and independent, enjoys dancing and horse back riding Prior Function Level of Independence: Independent with basic ADLs,Independent with homemaking with ambulation Vocation: Retired Vision Baseline Vision/History: Wears glasses Wears Glasses: At all times Vision Assessment?: Yes Perception  Perception: Within Functional Limits Cognition Overall Cognitive Status: Impaired/Different from baseline Arousal/Alertness: Awake/alert Orientation Level: Place;Situation;Person Person: Oriented Place: Oriented Situation: Oriented Year: 2021 Month: December Day of Week: Correct Memory: Impaired Memory Impairment: Retrieval deficit;Decreased short term memory;Decreased recall of new information Immediate Memory Recall: Sock;Blue;Bed Memory Recall Sock: Not able to recall Memory Recall Blue: Without Cue Memory Recall Bed: Without Cue Sustained Attention: Appears intact Awareness: Appears intact Problem Solving: Impaired Problem Solving Impairment: Functional complex Safety/Judgment: Appears intact Sensation Sensation Light Touch: Appears Intact Hot/Cold: Appears Intact Coordination Gross Motor Movements are Fluid and Coordinated: No Fine Motor Movements are Fluid and Coordinated: No Coordination and Movement Description: decreased smoothness and accuracy with L hand Balance Static Sitting Balance Static Sitting - Balance Support: Feet supported Dynamic Sitting Balance Dynamic Sitting - Balance Support: During functional activity Dynamic Sitting - Level of Assistance: 5: Stand by assistance Static Standing Balance Static Standing - Balance Support: During functional activity Static Standing - Level of Assistance: 4: Min assist Dynamic Standing Balance Dynamic Standing - Balance Support: During functional activity Dynamic Standing - Level of Assistance: 4: Min assist Extremity/Trunk Assessment RUE Assessment RUE Assessment: Within Functional  Limits LUE Assessment LUE Assessment: Exceptions to [facility] Active Range of Motion (AROM) Comments: 15 degrees active FF- old shoulder inj8ury LUE Strength Left Shoulder Flexion: 2-/5  Care Tool Care Tool Self Care Eating   Eating Assist Level: Set up assist    Oral Care    Oral Care Assist Level: Set up assist    Bathing   Body parts bathed by patient: Right arm;Left arm;Chest;Abdomen;Front perineal area;Buttocks;Left upper leg;Right upper leg;Right lower leg;Left lower leg;Face     Assist Level: Minimal Assistance - Patient > 75%    Upper Body Dressing(including orthotics)   What is the patient wearing?: Pull over shirt   Assist Level: Contact Guard/Touching assist    Lower Body Dressing (excluding footwear)   What is the patient wearing?: Underwear/pull up;Pants Assist for lower body dressing: Contact Guard/Touching assist    Putting on/Taking off footwear   What is the patient wearing?: Non-skid slipper socks Assist for footwear: Contact Guard/Touching assist  Care Tool Toileting Toileting activity   Assist for toileting: Minimal Assistance - Patient > 75%     Care Tool Transfers Sit to stand transfer   Sit to stand assist level: Minimal Assistance - Patient > 75%    Chair/bed transfer   Chair/bed transfer assist level: Minimal Assistance - Patient > 75%     Toilet transfer   Assist Level: Minimal Assistance - Patient > 75%     Care Tool Cognition Expression of Ideas and Wants Expression of Ideas and Wants: Without difficulty (complex and basic) - expresses complex messages without difficulty and with speech that is clear and easy to understand   Understanding Verbal and Non-Verbal Content Understanding Verbal and Non-Verbal Content: Understands (complex and basic) - clear comprehension without cues or repetitions   Memory/Recall Ability *first 3 days only Memory/Recall Ability *first 3 days only: Location of own room;Staff names and faces;That he or she  is in a hospital/hospital unit;Current season    Refer to Care Plan for [place] [facility] OT Short Term Goal 1 (Week 1): LTG=STG 2/2 ELOS  Recommendations for other services: None    Skilled Therapeutic Intervention Pt greeted semi-reclined in bed and agreeable to OT eval and treat. Pt completed bed mobility w/ supervision. She ambulated to the wc with min A to stand from EOB w/ RW and CGA/min A to get to wc. Bathing/dressing completed from wc at the sink with overall min A-details on BADL performance noted below. Worked on L UE coordination with 9 hole peg test and graded peg board puzzle.OT addressde rehab process, ELOS, and OT goals. Pt ambulated to recliner at end of session with RW, min A to stand, and CGA to ambulate. Pt left seated in recliner with chair alarm on, call bell in reach, and needs met.   ADL ADL Eating: Set up Grooming: Setup Upper Body Bathing: Setup Lower Body Bathing: Minimal assistance Upper Body Dressing: Setup Lower Body Dressing: Minimal assistance Toileting: Minimal assistance Toilet Transfer Method: Ambulating Tub/Shower Transfer: Minimal assistance Mobility  Transfers Sit to Stand: Contact Guard/Touching assist Stand to Sit: Contact Guard/Touching assist   Discharge Criteria: Patient will be discharged from OT if patient refuses treatment 3 consecutive times without medical reason, if treatment goals not met, if there is a change in medical status, if patient makes no progress towards goals or if patient is discharged from hospital.  The above assessment, treatment plan, treatment alternatives and goals were discussed and mutually agreed upon: by patient  Valma Cava 09/18/2020, 12:49 PM

## 2020-09-18 NOTE — Progress Notes (Signed)
Inpatient Rehabilitation Center Individual Statement of Services  Patient Name:  [name] Page  Date:  09/18/2020  Welcome to the Inpatient Rehabilitation Center.  Our goal is to provide you with an individualized program based on your diagnosis and situation, designed to meet your specific needs.  With this comprehensive rehabilitation program, you will be expected to participate in at least 3 hours of rehabilitation therapies Monday-Friday, with modified therapy programming on the weekends.  Your rehabilitation program will include the following services:  Physical Therapy (PT), Occupational Therapy (OT), Speech Therapy (ST), 24 hour per day rehabilitation nursing, Therapeutic Recreaction (TR), Neuropsychology, Care Coordinator, Rehabilitation Medicine, Nutrition Services, Pharmacy Services and Other  Weekly team conferences will be held on Tuesday to discuss your progress.  Your Inpatient Rehabilitation Care Coordinator will talk with you frequently to get your input and to update you on team discussions.  Team conferences with you and your family in attendance may also be held.  Expected length of stay: 7-10 Days  Overall anticipated outcome: MOD I to Supervision  Depending on your progress and recovery, your program may change. Your Inpatient Rehabilitation Care Coordinator will coordinate services and will keep you informed of any changes. Your Inpatient Rehabilitation Care Coordinator's name and contact numbers are listed  below.  The following services may also be recommended but are not provided by the Inpatient Rehabilitation Center:    Home Health Rehabiltiation Services  Outpatient Rehabilitation Services    Arrangements will be made to provide these services after discharge if needed.  Arrangements include referral to agencies that provide these services.  Your insurance has been verified to be:  Medicare A & B Your primary doctor is:  Delynn Flavin, DO  Pertinent  information will be shared with your doctor and your insurance company.  Inpatient Rehabilitation Care Coordinator:  Susie Cassette [phone] or ([phone]  Information discussed with and copy given to patient by: Andria Rhein, 09/18/2020, 12:15 PM

## 2020-09-18 NOTE — Progress Notes (Signed)
Inpatient Rehabilitation Care Coordinator Assessment and Plan Patient Details  Name: [name] Page MRN: [id number] Date of Birth: Mar 11, 1951  Today's Date: 09/18/2020  Hospital Problems: Principal Problem:   Meningioma [facility])  Past Medical History:  Past Medical History:  Diagnosis Date  . Arthritis   . Basal cell carcinoma   . 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 Cancer ([place])    melanoma  . Dysuria   . Frequency of urination   . Hematuria   . History of kidney stones   . Osteoporosis   . Renal calculus, bilateral   . 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 Urgency of urination   . Wears glasses    Past Surgical History:  Past Surgical History:  Procedure Laterality Date  . APPLICATION OF CRANIAL NAVIGATION Right 09/11/2020   Procedure: APPLICATION OF CRANIAL NAVIGATION;  Surgeon: Vallarie Mare, MD;  Location: [place];  Service: Neurosurgery;  Laterality: Right;  . CRANIOTOMY Right 09/11/2020   Procedure: Right Pterional Craniotomy for resection of anterior skull base mass with Brain Lab;  Surgeon: Vallarie Mare, MD;  Location: [place];  Service: Neurosurgery;  Laterality: Right;  . CYSTOSCOPY WITH RETROGRADE PYELOGRAM, URETEROSCOPY AND STENT PLACEMENT Bilateral 08/11/2013   Procedure: CYSTOSCOPY WITH RETROGRADE PYELOGRAM, URETEROSCOPY AND STENT PLACEMENT;  Surgeon: Alexis Frock, MD;  [facility];  Service: Urology;  Laterality: Bilateral;  . CYSTOSCOPY WITH URETEROSCOPY AND STENT PLACEMENT Bilateral 08/29/2013   Procedure: CYSTOSCOPY WITH BILATERAL URETEROSCOPY AND STENT REPLACEMENTS, stone extraction;  Surgeon: Alexis Frock, MD;  Location: [facility];  Service: Urology;  Laterality: Bilateral;  . CYSTOSCOPY/RETROGRADE/URETEROSCOPY/STONE EXTRACTION WITH BASKET Bilateral 01/31/2016   Procedure: CYSTOSCOPY/BILATERAL RETROGRADE PYELOGRAMS/BILATERAL URETEROSCOPY/BILATERAL STONE EXTRACTION WITH BASKET/RIGHT  URETERAL STENT PLACEMENT;  Surgeon: Raynelle Bring, MD;  [facility];  Service: Urology;  Laterality: Bilateral;  . HEMIARTHROPLASTY SHOULDER FRACTURE Left 1992  . HOLMIUM LASER APPLICATION Bilateral 42/68/3419   Procedure: HOLMIUM LASER APPLICATION;  Surgeon: Alexis Frock, MD;  Location: [facility];  Service: Urology;  Laterality: Bilateral;   Social History:  reports that she has never smoked. She has never used smokeless tobacco. She reports that she does not drink alcohol and does not use drugs.  Family / Support Systems Marital Status: Single Children: Daughter Public relations account executive) Other Supports: Sister Anticipated Caregiver: [name] and others Ability/Limitations of Caregiver: Sup to [employer] Caregiver Availability: 24/7  Social History Preferred language: English Religion: Methodist Read: Yes Write: Yes   Abuse/Neglect Abuse/Neglect Assessment Can Be Completed: Yes Physical Abuse: Denies Verbal Abuse: Denies Sexual Abuse: Denies Exploitation of patient/patient's resources: Denies Self-Neglect: Denies  Emotional Status Pt's affect, behavior and adjustment status: no Recent Psychosocial Issues: no Psychiatric History: no Substance Abuse History: no  Patient / Family Perceptions, Expectations & Goals Pt/Family understanding of illness & functional limitations: yes Premorbid pt/family roles/activities: Previously independent and driving Anticipated changes in roles/activities/participation: Family will assit Pt/family expectations/goals: Min A to [employer]: None Premorbid Home Care/DME Agencies: None Transportation available at discharge: Family able to tranport  Discharge Planning Living Arrangements: Alone (Daughter and sister live next door) Support Systems: Children,Other relatives Type of Residence: Private residence (mobile home: 1 level home, 9 steps to enter with railings (working  on ramp)) Insurance  Resources: Chartered certified accountant Resources: [place] Referred: No Money Management: Patient Does the patient have any problems obtaining your medications?: No Home Management: Independent overall  previously Patient/Family Preliminary Plans: Assistance from family at discharge Care Coordinator Anticipated Follow Up Needs: HH/OP Expected length of stay: 7-10 days  Clinical Impression Covering for primary SW, Auria. Sw met with patient at bedside. Introduced self, explained role and addressed questions and concerns. Patient plans to discharge home alone, sister and daughter live across the street. Ramp currently being build on home. No additional questions or concerns. SW left daughter a VM.   Dyanne Iha 09/18/2020, 12:42 PM

## 2020-09-19 ENCOUNTER — Inpatient Hospital Stay (HOSPITAL_COMMUNITY): Payer: Medicare Other | Admitting: Speech Pathology

## 2020-09-19 ENCOUNTER — Inpatient Hospital Stay (HOSPITAL_COMMUNITY): Payer: Medicare Other

## 2020-09-19 ENCOUNTER — Inpatient Hospital Stay (HOSPITAL_COMMUNITY): Payer: Medicare Other | Admitting: Occupational Therapy

## 2020-09-19 NOTE — IPOC Note (Signed)
Overall Plan of Care [facility]) Patient Details Name: [name] Page MRN: [id number] DOB: 12/05/1950  Admitting Diagnosis: Meningioma [facility])  Hospital Problems: Principal Problem:   Meningioma [facility])     Functional Problem List: Nursing Behavior,Edema,Endurance,Medication Management,Nutrition,Pain,Safety,Skin Integrity  PT Balance,Behavior,Edema,Endurance,Motor,Safety  OT Balance,Behavior,Cognition,Edema,Endurance,Motor,Pain,Perception,Sensory,Safety,Skin Integrity,Vision  SLP Cognition  TR         Basic ADL's: OT Grooming,Eating,Bathing,Dressing,Toileting     Advanced  ADL's: OT       Transfers: PT Bed Mobility,Bed to Chair,Car,Furniture,Floor  OT Toilet,Tub/Shower     Locomotion: PT Ambulation,Wheelchair Mobility,Stairs     Additional Impairments: OT Fuctional Use of Upper Extremity  SLP Social Cognition   Memory,Problem Solving  TR      Anticipated Outcomes Item Anticipated Outcome  Self Feeding Mod I  Swallowing      Basic self-care  Mod I/supervision  Engineer, technical sales Transfers Supervision  Bowel/Bladder  Supervision  Transfers  Mod I with LRAD  Locomotion  Ambulatory with LRAD at Mod I level  Communication     Cognition  Mod I  Pain  <3  Safety/Judgment  Supervision   Therapy Plan: PT Intensity: Minimum of 1-2 x/day ,45 to 90 minutes PT Frequency: 5 out of 7 days PT Duration Estimated Length of Stay: 7-10 days OT Intensity: Minimum of 1-2 x/day, 45 to 90 minutes OT Frequency: 5 out of 7 days OT Duration/Estimated Length of Stay: 7-10 SLP Intensity: Minumum of 1-2 x/day, 30 to 90 minutes SLP Frequency: 3 to 5 out of 7 days SLP Duration/Estimated Length of Stay: 5-7 days   Due to the current state of emergency, patients may not be receiving their 3-hours of Medicare-mandated therapy.   Team Interventions: Nursing Interventions Patient/Family Education,Disease Management/Prevention,Pain Management,Medication Management,Skin  Care/Wound Management,Discharge Planning,Psychosocial Support  PT interventions Ambulation/gait training,Discharge planning,Functional mobility training,Psychosocial support,Therapeutic Activities,Visual/perceptual remediation/compensation,Skin care/wound management,Wheelchair propulsion/positioning,Therapeutic Exercise,Neuromuscular re-education,Disease management/prevention,Balance/vestibular training,Cognitive remediation/compensation,Pain management,DME/adaptive equipment instruction,Splinting/orthotics,UE/LE Strength taining/ROM,UE/LE Coordination activities,Patient/family education,Stair training,Functional Lobbyist stimulation,Community reintegration  OT Interventions Balance/vestibular training,Cognitive remediation/compensation,Community reintegration,Discharge planning,Disease Psychologist, prison and probation services stimulation,Functional mobility training,Neuromuscular re-education,Pain management,Patient/family education,Self Care/advanced ADL retraining,Wheelchair propulsion/positioning,Visual/perceptual remediation/compensation,UE/LE Coordination activities,UE/LE Strength taining/ROM,Therapeutic Exercise,Therapeutic Activities,Psychosocial support,Skin care/wound managment,Splinting/orthotics  SLP Interventions Cognitive remediation/compensation,Internal/external aids,Cueing hierarchy,Environmental controls,Therapeutic Activities,Patient/family education,Functional tasks  TR Interventions    SW/CM Interventions Discharge Planning,Psychosocial Support,Patient/Family Education   Barriers to Discharge MD  Medical stability  Nursing Decreased caregiver support,Home environment access/layout,Wound Care,Lack of/limited family support,Medication compliance,Behavior,Nutrition means    PT Inaccessible home environment,Decreased caregiver support,Home environment Pharmacist, hospital    OT      SLP      SW       Team Discharge  Planning: Destination: PT-Home ,OT- Home , SLP-Home Projected Follow-up: PT-Home health PT, OT-  Outpatient OT, SLP- (intermittent supervision) Projected Equipment Needs: PT-Rolling walker with 5" wheels,To be determined, OT- To be determined, SLP-None recommended by SLP Equipment Details: PT- , OT-  Patient/family involved in discharge planning: PT- Patient,  OT-Patient, SLP-Patient  MD ELOS: 10-15 days S/modI Medical Rehab Prognosis:  Excellent Assessment: [name] Page is a 69 year old woman admitted to CIR with decreased functional ability particularly lower extremity weakness with headache secondary to anterior skull base mass status post craniotomy for resection of anterior skull base mass 09/11/2020. Cr is being monitored weekly given AKI. Anxiety is being managed with Lexapro. Propanolol has been started for tremor. Hydralazine has been increased given uncontrolled BP. Tylenol has been d/ced given elevated LFTs. Wound care has been consulted given erythematous lesion behind her right ear.  See Team Conference Notes for weekly updates to the plan of care

## 2020-09-19 NOTE — Plan of Care (Signed)
  Problem: RH Balance Goal: LTG Patient will maintain dynamic sitting balance (PT) Description: LTG:  Patient will maintain dynamic sitting balance with assistance during mobility activities (PT) Flowsheets (Taken 09/19/2020 1126) LTG: Pt will maintain dynamic sitting balance during mobility activities with:: Independent Goal: LTG Patient will maintain dynamic standing balance (PT) Description: LTG:  Patient will maintain dynamic standing balance with assistance during mobility activities (PT) Flowsheets (Taken 09/19/2020 1126) LTG: Pt will maintain dynamic standing balance during mobility activities with:: Supervision/Verbal cueing   Problem: RH Bed Mobility Goal: LTG Patient will perform bed mobility with assist (PT) Description: LTG: Patient will perform bed mobility with assistance, with/without cues (PT). Flowsheets (Taken 09/19/2020 1126) LTG: Pt will perform bed mobility with assistance level of: Independent with assistive device    Problem: RH Bed to Chair Transfers Goal: LTG Patient will perform bed/chair transfers w/assist (PT) Description: LTG: Patient will perform bed to chair transfers with assistance (PT). Flowsheets (Taken 09/19/2020 1126) LTG: Pt will perform Bed to Chair Transfers with assistance level: Independent with assistive device    Problem: RH Car Transfers Goal: LTG Patient will perform car transfers with assist (PT) Description: LTG: Patient will perform car transfers with assistance (PT). Flowsheets (Taken 09/19/2020 1126) LTG: Pt will perform car transfers with assist:: Supervision/Verbal cueing   Problem: RH Furniture Transfers Goal: LTG Patient will perform furniture transfers w/assist (OT/PT) Description: LTG: Patient will perform furniture transfers  with assistance (OT/PT). Flowsheets (Taken 09/19/2020 1126) LTG: Pt will perform furniture transfers with assist:: Independent with assistive device    Problem: RH Ambulation Goal: LTG Patient will  ambulate in controlled environment (PT) Description: LTG: Patient will ambulate in a controlled environment, # of feet with assistance (PT). Flowsheets (Taken 09/19/2020 1126) LTG: Pt will ambulate in controlled environ  assist needed:: Supervision/Verbal cueing LTG: Ambulation distance in controlled environment: 12ft with LRAD Goal: LTG Patient will ambulate in home environment (PT) Description: LTG: Patient will ambulate in home environment, # of feet with assistance (PT). Flowsheets (Taken 09/19/2020 1126) LTG: Pt will ambulate in home environ  assist needed:: Supervision/Verbal cueing LTG: Ambulation distance in home environment: 29ft with LRAD   Problem: RH Stairs Goal: LTG Patient will ambulate up and down stairs w/assist (PT) Description: LTG: Patient will ambulate up and down # of stairs with assistance (PT) Flowsheets (Taken 09/19/2020 1126) LTG: Pt will ambulate up/down stairs assist needed:: Supervision/Verbal cueing LTG: Pt will  ambulate up and down number of stairs: 4 steps with 1 rail

## 2020-09-19 NOTE — Progress Notes (Signed)
IV was discontinued

## 2020-09-19 NOTE — Progress Notes (Signed)
On site provider informed about patients edema & IV access was discontinued.Will continue to monitor & give report to oncoming nurse

## 2020-09-19 NOTE — Progress Notes (Signed)
Physical Therapy Session Note  Patient Details  Name: [name] Page MRN: [id number] Date of Birth: [birth year]  Today's Date: 09/19/2020 PT Individual Time: 1054-1204 PT Individual Time Calculation (min): 70 min   Short Term Goals: Week 1:  PT Short Term Goal 1 (Week 1): STG=LTG due to ELOS  Skilled Therapeutic Interventions/Progress Updates:     Pt received supine in bed and agrees to therapy. No complaint of pain. Supine to sit mod(I) with bed features. Pt performs sit to stand and ambulatory transfer to toilet with CGA. Continent of urine and independent with pericare. WC transport to gym for time management. Pt ambulates 100' with CGA and no AD. PT cues for upright gaze to improve posture and balance, and increasing gait speed and stride length to decrease risk for falls.   Pt performs NMR for standing balance and activity tolerance, tossing ball at trampoline and catching rebound to challenge dynamic balance and postural reactions. PT provides CGA and pt performs 1x20 facing trampoline, 1x20 tossing to R, and 1x20 tossing to L (to incorporate trunk rotation). Pt progresses to 1x20 standing on airex mat to provide unstable surface for increased challenge, with PT providing minA and cues on posture and body mechanics.  Pt completes sit to stand x5 with arm folded across chest, with PT cues on sequencing and body mechanics. Pt then performs TUG with following results (indicating high risk for falls): 22.0" 19.4" 22.5" Pt cues for upright gaze to improve balance.  Sit to stand and stand step transfer back to bed with supervision. Sit to supine mod(I). Left with alarm intact and all needs within reach.  Therapy Documentation Precautions:  Precautions Precautions: Fall Restrictions Weight Bearing Restrictions: No    Therapy/Group: Individual Therapy  Beau Fanny, PT, DPT 09/19/2020, 12:25 PM

## 2020-09-19 NOTE — Progress Notes (Signed)
Occupational Therapy Session Note  Patient Details  Name: [name] Page MRN: [id number] Date of Birth: 15-Sep-1951  Today's Date: 09/19/2020 OT Individual Time: 6754-4920 OT Individual Time Calculation (min): 60 min    Short Term Goals: Week 1:  OT Short Term Goal 1 (Week 1): LTG=STG 2/2 ELOS  Skilled Therapeutic Interventions/Progress Updates:    Pt greeted semi-reclined in bed and agreeable to OT treatment session foucsed on self-care retraining. Pt came to sitting EOB with supervision. She then ambulated to the wheelchair with RW and CGA. Pt reported feeling a little shaky this morning. UB bathing completed from wc with supervision. Pt with large blood clot in hair which pt requested OT cut out. OT carefully cut around hair to remove clump. Noted some abnormal skin with oozing behind ear where mass of hair was. Nursing and MD notified to come check skin. OT then washed hair in sink using wc wash basin to protect incision as staples are still in and can;t get wet yet. LB bathing/dressing completed sit<>stand at the sink with min A. Pt requested to return to bed at end of session and pt left semi-reclined in bed with bed alarm on, call bell in reach, and needs met.  Therapy Documentation Precautions:  Precautions Precautions: Fall Restrictions Weight Bearing Restrictions: No Pain:  denies pain, reports discomfort from swelling  Therapy/Group: Individual Therapy  Valma Cava 09/19/2020, 9:59 AM

## 2020-09-19 NOTE — Progress Notes (Addendum)
[facility] PHYSICAL MEDICINE & REHABILITATION PROGRESS NOTE   Subjective/Complaints: No complaints this morning On the commode.  Gentry Fitz Doe OT notes concerning lesion behind right ear- examined and given cancer history will have this further assessed.   ROS: Denies headache, pain  Objective:   No results found. Recent Labs    09/18/20 0454  WBC 8.2  HGB 8.8*  HCT 28.2*  PLT 163   Recent Labs    09/18/20 0454  NA 137  K 4.1  CL 107  CO2 23  GLUCOSE 98  BUN 13  CREATININE 0.94  CALCIUM 7.7*    Intake/Output Summary (Last 24 hours) at 09/19/2020 1004 Last data filed at 09/19/2020 0939 Gross per 24 hour  Intake 880 ml  Output --  Net 880 ml        Physical Exam: Vital Signs Blood pressure (!) 173/68, pulse 64, temperature 98 F (36.7 C), temperature source Oral, resp. rate 16, height 5\' 2"  (1.575 m), weight 72.8 kg, SpO2 97 %. Gen: no distress, normal appearing HEENT: oral mucosa pink and moist, NCAT Cardio: Reg rate Chest: normal effort, normal rate of breathing Abd: soft, non-distended Ext: no edema Musculoskeletal:     Cervical back: Normal range of motion and neck supple.     Comments: Bilateral lower extremity edema No tenderness in extremities  Skin:    General: Skin is warm and dry.     Comments: Staples to right craniotomy CDI  Large bloody lesion behind right ear.  Neurological:     Mental Status: She is alert.     Comments: Alert  Makes eye contact with examiner.   Provides her name and age with some mild delay in processing.   Follows commands. Motor: RUE: 4+/5 proximal distal RLE: 4/5 proximal distally in LUE: Shoulder abduction 2+/5 (baseline), distally 4/5 Left lower extremity: 4/5 proximal distal  Psychiatric:        Mood and Affect: Mood normal.        Behavior: Behavior normal.    Assessment/Plan: 1. Functional deficits which require 3+ hours per day of interdisciplinary therapy in a comprehensive inpatient rehab  setting.  Physiatrist is providing close team supervision and 24 hour management of active medical problems listed below.  Physiatrist and rehab team continue to assess barriers to discharge/monitor patient progress toward functional and medical goals  Care Tool:  Bathing    Body parts bathed by patient: Right arm,Left arm,Chest,Abdomen,Front perineal area,Buttocks,Left upper leg,Right upper leg,Right lower leg,Left lower leg,Face         Bathing assist Assist Level: Minimal Assistance - Patient > 75%     Upper Body Dressing/Undressing Upper body dressing   What is the patient wearing?: Pull over shirt    Upper body assist Assist Level: Contact Guard/Touching assist    Lower Body Dressing/Undressing Lower body dressing      What is the patient wearing?: Underwear/pull up     Lower body assist Assist for lower body dressing: Contact Guard/Touching assist     Toileting Toileting    Toileting assist Assist for toileting: Contact Guard/Touching assist     Transfers Chair/bed transfer  Transfers assist     Chair/bed transfer assist level: Contact Guard/Touching assist     Locomotion Ambulation   Ambulation assist      Assist level: Minimal Assistance - Patient > 75% Assistive device: Hand held assist Max distance: 80   Walk 10 feet activity   Assist     Assist level: Minimal Assistance -  Patient > 75% Assistive device: Hand held assist   Walk 50 feet activity   Assist    Assist level: Minimal Assistance - Patient > 75% Assistive device: Hand held assist    Walk 150 feet activity   Assist Walk 150 feet activity did not occur: Safety/medical concerns         Walk 10 feet on uneven surface  activity   Assist Walk 10 feet on uneven surfaces activity did not occur: Safety/medical concerns         Wheelchair     Assist   Type of Wheelchair: Manual    Wheelchair assist level: Supervision/Verbal cueing Max wheelchair  distance: 31ft    Wheelchair 50 feet with 2 turns activity    Assist        Assist Level: Supervision/Verbal cueing   Wheelchair 150 feet activity     Assist      Assist Level: Moderate Assistance - Patient 50 - 74%   Blood pressure (!) 173/68, pulse 64, temperature 98 F (36.7 C), temperature source Oral, resp. rate 16, height 5\' 2"  (1.575 m), weight 72.8 kg, SpO2 97 %.     Medical Problem List and Plan: 1.  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  Follow-up Dr. Duffy Rhody.  Decadron protocol as indicated             -patient may not shower             -ELOS/Goals: 10-15 days/supervision/mod I             Continue CIR 2.  Antithrombotics: -DVT/anticoagulation: Lovenox. Cr trending downward 12/30.             -antiplatelet therapy: N/A 3. Pain Management: Hydrocodone as needed             Monitor, particularly for headaches with increased exertion 4. Mood: Lexapro 10 mg daily- change to nightly as she takes at home, feels it helps her relax at night.              -antipsychotic agents: N/A 5. Neuropsych: This patient is capable of making decisions on her own behalf. 6. Skin/Wound Care: Routine skin checks. IV discontinued.  7. Fluids/Electrolytes/Nutrition: Routine in and outs             CMP ordered for tomorrow a.m.  8.  Seizure prophylaxis.  Keppra 500 mg twice daily 9.  Hypertension.  Hydralazine 10 mg every 8 hours.    [id number]: Systolic BP uncontrolled to 150s. Increase Hydralazine to 50mg   12/31: continues to be uncontrolled: propanolol addition (#14) should help.              Monitor increase mobility 10.  History of renal stones.  Follow-up outpatient urology services 11.  Hyperlipidemia: Lipitor 12.  Constipation.  Colace 100 mg twice daily, MiraLAX as needed 13.  Iron deficiency anemia.  Continue  iron supplement             CBC stable 14. Left hand tremor- longstanding. Discussed with patient trying propanolol 10mg  and she is agreeable. BP and HR shoulder tolerate.  15. ALT elevated: tylenol d/ced. Discussed with patient.  16. Suspicious bloody lesion behind ear- will get further evaluation given cancer history.   LOS: 2 days A FACE TO FACE EVALUATION WAS PERFORMED  Carry Weesner P Kada Friesen 09/19/2020, 10:04 AM

## 2020-09-19 NOTE — Progress Notes (Signed)
Speech Language Pathology Daily Session Note  Patient Details  Name: FLORINE SPRENKLE MRN: [id number] Date of Birth: [birth year]  Today's Date: 09/19/2020 SLP Individual Time: 1400-1453 SLP Individual Time Calculation (min): 53 min  Short Term Goals: Week 1: SLP Short Term Goal 1 (Week 1): STGs=LTGs due to ELOS  Skilled Therapeutic Interventions: Skilled treatment session focused on cognitive goals. SLP facilitated session by providing supervision level verbal cues for complex problem solving and organization during a complex calendar/appointment task. SLP also initiated a complex scheduling task that was unable to be completed due to blurred vision, RN made aware. Patient left upright in bed with daughter present and alarm on. Continue with current plan of care.      Pain No/Denies Pain   Therapy/Group: Individual Therapy  Kaitlynne Wenz 09/19/2020, 3:02 PM

## 2020-09-19 NOTE — Progress Notes (Addendum)
Patient was noted alert & responsive at the beginning of the shift. No c/o pain, but has edema that is taute & causes her some discomfort. Pitting of +1, edema is noted to BLE, feet, thighs & sacral area. She also is starting to develop some to both arms. Lungs sound clear, no cough, no abnormal heart sounds noted & heart rate was regular at the time.Ted hose were on & were removed, pedal pulses felt more to the right foot than the left, but both present. Legs were elevated & heels floated.Medications were given as ordered & tolerated well. The dressing to the lower mid back rolled away from the wound. The wound was cleansed & replaced. The other dressing to her chest was intact. During transfers to the toilet, patient noted to have weakness to both lower extremities. She transferred with a rolling walker with contact guard assist. Verbal cuing was given to remind patient to take her walker when going to the sink to wash her hands. Once, she was noted using her right hand to place her left hand on the grab bar in the bathroom. She has an IV site to the right wrist that looks like it is coming out. It did flush last night & dressing was reinforced, but this morning it was sliding out again. The site keeps getting wet when she washes her hands. Will request removal today. Will continue to monitor for changes. No acute distress noted at this time.

## 2020-09-20 ENCOUNTER — Encounter (HOSPITAL_COMMUNITY): Payer: Medicare Other | Admitting: Occupational Therapy

## 2020-09-20 DIAGNOSIS — R600 Localized edema: Secondary | ICD-10-CM

## 2020-09-20 DIAGNOSIS — R7401 Elevation of levels of liver transaminase levels: Secondary | ICD-10-CM

## 2020-09-20 DIAGNOSIS — R251 Tremor, unspecified: Secondary | ICD-10-CM

## 2020-09-20 DIAGNOSIS — R609 Edema, unspecified: Secondary | ICD-10-CM

## 2020-09-20 DIAGNOSIS — E8809 Other disorders of plasma-protein metabolism, not elsewhere classified: Secondary | ICD-10-CM

## 2020-09-20 DIAGNOSIS — I10 Essential (primary) hypertension: Secondary | ICD-10-CM

## 2020-09-20 DIAGNOSIS — E46 Unspecified protein-calorie malnutrition: Secondary | ICD-10-CM

## 2020-09-20 MED ORDER — PROPRANOLOL HCL 10 MG PO TABS
10.0000 mg | ORAL_TABLET | Freq: Three times a day (TID) | ORAL | Status: DC
  Administered 2020-09-20 – 2020-09-25 (×14): 10 mg via ORAL
  Filled 2020-09-20 (×15): qty 1

## 2020-09-20 MED ORDER — BUMETANIDE 0.5 MG PO TABS
0.5000 mg | ORAL_TABLET | Freq: Every day | ORAL | Status: DC
  Administered 2020-09-20 – 2020-09-22 (×3): 0.5 mg via ORAL
  Filled 2020-09-20 (×3): qty 1

## 2020-09-20 MED ORDER — PROSOURCE PLUS PO LIQD
30.0000 mL | Freq: Two times a day (BID) | ORAL | Status: DC
  Administered 2020-09-20 – 2020-09-25 (×11): 30 mL via ORAL
  Filled 2020-09-20 (×10): qty 30

## 2020-09-20 NOTE — Plan of Care (Signed)
  Problem: Consults Goal: RH GENERAL PATIENT EDUCATION Description: See Patient Education module for education specifics. Outcome: Progressing Goal: Skin Care Protocol Initiated - if Braden Score 18 or less Description: If consults are not indicated, leave blank or document N/A Outcome: Progressing Goal: Nutrition Consult-if indicated Outcome: Progressing   Problem: RH SKIN INTEGRITY Goal: RH STG SKIN FREE OF INFECTION/BREAKDOWN Description: Skin to remain free from additional breakdown or infection with supervision assist. Outcome: Progressing Goal: RH STG MAINTAIN SKIN INTEGRITY WITH ASSISTANCE Description: STG Maintain Skin Integrity With  min Assistance. Outcome: Progressing Goal: RH STG ABLE TO PERFORM INCISION/WOUND CARE W/ASSISTANCE Description: STG Able To Perform Incision/Wound Care With supervision Assistance. Outcome: Progressing   Problem: RH SAFETY Goal: RH STG ADHERE TO SAFETY PRECAUTIONS W/ASSISTANCE/DEVICE Description: STG Adhere to Safety Precautions With supervision Assistance/Device. Outcome: Progressing   Problem: RH PAIN MANAGEMENT Goal: RH STG PAIN MANAGED AT OR BELOW PT'S PAIN GOAL Description: <3 on a 0-10 pain scale. Outcome: Progressing   Problem: RH KNOWLEDGE DEFICIT GENERAL Goal: RH STG INCREASE KNOWLEDGE OF SELF CARE AFTER HOSPITALIZATION Description: Patient will be able to demonstrate knowledge of medication management, dietary management, skin/wound care management, and follow up care recommendations with educational materials and handouts with cues and reminders from staff. Outcome: Progressing   Problem: Consults Goal: RH BRAIN INJURY PATIENT EDUCATION Description: Description: See Patient Education module for eduction specifics Outcome: Progressing

## 2020-09-20 NOTE — Progress Notes (Signed)
Occupational Therapy Session Note  Patient Details  Name: [name] Page MRN: [id number] Date of Birth: March 20, 1951  Today's Date: 09/20/2020 OT Group Time:  - 60 minutes missed     Skilled Therapeutic Interventions/Progress Updates:    Pt reported being up all night last night and not feeling well enough to participate in group today. 60 minutes missed.    Therapy Documentation Precautions:  Precautions Precautions: Fall Restrictions Weight Bearing Restrictions: No ADL: ADL Eating: Set up Grooming: Setup Upper Body Bathing: Setup Lower Body Bathing: Minimal assistance Upper Body Dressing: Setup Lower Body Dressing: Minimal assistance Toileting: Minimal assistance Toilet Transfer Method: Ambulating Tub/Shower Transfer: Minimal assistance      Therapy/Group: Group Therapy  Fadi Menter A Baron Parmelee 09/20/2020, 7:21 AM

## 2020-09-20 NOTE — Progress Notes (Signed)
[facility] PHYSICAL MEDICINE & REHABILITATION PROGRESS NOTE   Subjective/Complaints: Patient seen sitting up in bed this morning.  She states she slept well overnight.  She states she is doing well.  She has questions regarding tremors.  ROS: Denies CP, SOB, N/V/D  Objective:   No results found. Recent Labs    09/18/20 0454  WBC 8.2  HGB 8.8*  HCT 28.2*  PLT 163   Recent Labs    09/18/20 0454  NA 137  K 4.1  CL 107  CO2 23  GLUCOSE 98  BUN 13  CREATININE 0.94  CALCIUM 7.7*    Intake/Output Summary (Last 24 hours) at 09/20/2020 1052 Last data filed at 09/20/2020 0746 Gross per 24 hour  Intake 718 ml  Output -  Net 718 ml        Physical Exam: Vital Signs Blood pressure (!) 174/74, pulse 65, temperature 98.6 F (37 C), temperature source Oral, resp. rate 17, height 5\' 2"  (1.575 m), weight 72.8 kg, SpO2 98 %. Constitutional: No distress . Vital signs reviewed. HENT: Normocephalic.  Right craniotomy. Eyes: EOMI. No discharge. Cardiovascular: No JVD.  RRR. Respiratory: Normal effort.  No stridor.  Bilateral clear to auscultation. GI: Non-distended.  BS +. Skin: Warm and dry.  Craniotomy site with staples CDI Lesion behind right ear Psych: Normal mood.  Normal behavior. Musc: Right > Left lower extremity edema, improving No tenderness in extremities. Neuro: Alert Makes eye contact with examiner.   Follows commands. Motor: RUE: 4+/5 proximal distal RLE: 4-/5 proximal distally, stable LUE: Shoulder abduction 2+/5 (baseline), distally 4/5 Left lower extremity: 4-/5 proximal distal, stable Generalized tremors  Assessment/Plan: 1. Functional deficits which require 3+ hours per day of interdisciplinary therapy in a comprehensive inpatient rehab setting.  Physiatrist is providing close team supervision and 24 hour management of active medical problems listed below.  Physiatrist and rehab team continue to assess barriers to discharge/monitor patient progress  toward functional and medical goals  Care Tool:  Bathing    Body parts bathed by patient: Right arm,Left arm,Chest,Abdomen,Front perineal area,Buttocks,Left upper leg,Right upper leg,Right lower leg,Left lower leg,Face         Bathing assist Assist Level: Minimal Assistance - Patient > 75%     Upper Body Dressing/Undressing Upper body dressing   What is the patient wearing?: Pull over shirt    Upper body assist Assist Level: Contact Guard/Touching assist    Lower Body Dressing/Undressing Lower body dressing      What is the patient wearing?: Underwear/pull up     Lower body assist Assist for lower body dressing: Contact Guard/Touching assist     Toileting Toileting    Toileting assist Assist for toileting: Contact Guard/Touching assist     Transfers Chair/bed transfer  Transfers assist     Chair/bed transfer assist level: Supervision/Verbal cueing     Locomotion Ambulation   Ambulation assist      Assist level: Contact Guard/Touching assist Assistive device: No Device Max distance: 100'   Walk 10 feet activity   Assist     Assist level: Contact Guard/Touching assist Assistive device: No Device   Walk 50 feet activity   Assist    Assist level: Contact Guard/Touching assist Assistive device: No Device    Walk 150 feet activity   Assist Walk 150 feet activity did not occur: Safety/medical concerns         Walk 10 feet on uneven surface  activity   Assist Walk 10 feet on uneven surfaces activity  did not occur: Safety/medical concerns         Wheelchair     Assist   Type of Wheelchair: Manual    Wheelchair assist level: Supervision/Verbal cueing Max wheelchair distance: 70ft    Wheelchair 50 feet with 2 turns activity    Assist        Assist Level: Supervision/Verbal cueing   Wheelchair 150 feet activity     Assist      Assist Level: Moderate Assistance - Patient 50 - 74%   Blood pressure (!)  174/74, pulse 65, temperature 98.6 F (37 C), temperature source Oral, resp. rate 17, height 5\' 2"  (1.575 m), weight 72.8 kg, SpO2 98 %.     Medical Problem List and Plan: 1.  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  Follow-up Dr. Duffy Rhody.  Decadron protocol as indicated  Continue CIR 2.  Antithrombotics: -DVT/anticoagulation: Lovenox.              -antiplatelet therapy: N/A 3. Pain Management: Hydrocodone as needed  Controlled with meds on 1/1             Monitor, particularly for headaches with increased exertion 4. Mood: Lexapro 10 mg daily- change to nightly as she takes at home             -antipsychotic agents: N/A 5. Neuropsych: This patient is capable of making decisions on her own behalf. 6. Skin/Wound Care: Routine skin checks.  7. Fluids/Electrolytes/Nutrition: Routine in and outs 8.  Seizure prophylaxis.  Keppra 500 mg twice daily 9.  Hypertension.    Hydralazine 10 mg every 8 hours increased to 50mg   Propanolol started on 1/1              Monitor increase mobility 10.  History of renal stones.  Follow-up outpatient urology services 11.  Hyperlipidemia: Lipitor 12.  Constipation.  Colace 100 mg twice daily, MiraLAX as needed 13.  Iron deficiency anemia.  Continue iron supplement             Hemoglobin 8.8 on 12/30, labs ordered for Monday 14.  Generalized tremors - longstanding.   Propanolol 10mg  started on 1/1 15. ALT elevated: tylenol d/ced. Discussed with patient.   Continue to monitor 16. Suspicious bloody lesion behind ear- will get further evaluation given cancer history.  17.  Hypoalbuminemia  Supplement initiated on 1/1 18.  Peripheral edema  Bumex started on 1/1  CMP ordered for Monday  LOS: 3 days A FACE TO FACE EVALUATION WAS PERFORMED  Tinnie Kunin Lorie Phenix 09/20/2020, 10:52 AM

## 2020-09-21 ENCOUNTER — Inpatient Hospital Stay (HOSPITAL_COMMUNITY): Payer: Medicare Other | Admitting: Physical Therapy

## 2020-09-21 ENCOUNTER — Inpatient Hospital Stay (HOSPITAL_COMMUNITY): Payer: Medicare Other | Admitting: Speech Pathology

## 2020-09-21 ENCOUNTER — Inpatient Hospital Stay (HOSPITAL_COMMUNITY): Payer: Medicare Other

## 2020-09-21 NOTE — Progress Notes (Signed)
Speech Language Pathology Daily Session Note  Patient Details  Name: [name] Page MRN: [id number] Date of Birth: 12/01/1950  Today's Date: 09/21/2020 SLP Individual Time: 1305-1400 SLP Individual Time Calculation (min): 55 min  Short Term Goals: Week 1: SLP Short Term Goal 1 (Week 1): STGs=LTGs due to ELOS  Skilled Therapeutic Interventions:  Pt was seen for skilled ST targeting cognitive goals.  Pt was awake, alert, and agreeable to participating in treatment.  Pt reported having a good day in therapy after new medications for pain and edema were added to her regimen.  Pt reports her cognition is significantly clearer with better pain management and better sleep; however, it is not yet back to baseline.  SLP facilitated the session with a previously targeted scheduling task to address goals for problem solving.  Pt needed mod assist to identify and correct previous errors; however, after instruction on several compensatory strategies to help with problem solving (highlighting important information, writing information in a list format, and crossing items off of list as they are completed) SLP was able to fade cues to min assist-supervision for task organization.  Pt reported that she notices her attention is not yet back to baseline and she is still "distractible."  As a result, SLP provided skilled education regarding distraction management techniques.  All questions were answered to pt's satisfaction at this time.  Pt was left in bed with bed alarm set and call bell within reach.  Continue per current plan of care.      Pain Pain Assessment Pain Scale: 0-10 Pain Score: 0-No pain  Therapy/Group: Individual Therapy  Bobette Leyh, Melanee Spry 09/21/2020, 3:09 PM

## 2020-09-21 NOTE — Plan of Care (Signed)
  Problem: Consults Goal: RH GENERAL PATIENT EDUCATION Description: See Patient Education module for education specifics. Outcome: Progressing Goal: Skin Care Protocol Initiated - if Braden Score 18 or less Description: If consults are not indicated, leave blank or document N/A Outcome: Progressing Goal: Nutrition Consult-if indicated Outcome: Progressing   Problem: RH SKIN INTEGRITY Goal: RH STG SKIN FREE OF INFECTION/BREAKDOWN Description: Skin to remain free from additional breakdown or infection with supervision assist. Outcome: Progressing Goal: RH STG MAINTAIN SKIN INTEGRITY WITH ASSISTANCE Description: STG Maintain Skin Integrity With  min Assistance. Outcome: Progressing Goal: RH STG ABLE TO PERFORM INCISION/WOUND CARE W/ASSISTANCE Description: STG Able To Perform Incision/Wound Care With supervision Assistance. Outcome: Progressing   Problem: RH SAFETY Goal: RH STG ADHERE TO SAFETY PRECAUTIONS W/ASSISTANCE/DEVICE Description: STG Adhere to Safety Precautions With supervision Assistance/Device. Outcome: Progressing   Problem: RH PAIN MANAGEMENT Goal: RH STG PAIN MANAGED AT OR BELOW PT'S PAIN GOAL Description: <3 on a 0-10 pain scale. Outcome: Progressing   Problem: RH KNOWLEDGE DEFICIT GENERAL Goal: RH STG INCREASE KNOWLEDGE OF SELF CARE AFTER HOSPITALIZATION Description: Patient will be able to demonstrate knowledge of medication management, dietary management, skin/wound care management, and follow up care recommendations with educational materials and handouts with cues and reminders from staff. Outcome: Progressing   Problem: Consults Goal: RH BRAIN INJURY PATIENT EDUCATION Description: Description: See Patient Education module for eduction specifics Outcome: Progressing   

## 2020-09-21 NOTE — Progress Notes (Signed)
Occupational Therapy Session Note  Patient Details  Name: [name] Page MRN: [id number] Date of Birth: 1951/03/04  Today's Date: 09/21/2020 OT Individual Time: 1000-1100 OT Individual Time Calculation (min): 60 min    Short Term Goals: Week 1:  OT Short Term Goal 1 (Week 1): LTG=STG 2/2 ELOS  Skilled Therapeutic Interventions/Progress Updates:    Pt received supine with no c/o pain, reporting great improvement since new medication for pain was added yesterday. Pt completed bed mobility with supervision. Ambulatory transfer into the bathroom with close supervision, using RW. She completed all toileting tasks with supervision, voiding urine. Pt returned to the w/c for sink level ADLs. UB bathing and dressing completed with supervision. CGA for LB bathing and dressing with min cueing for safe strategies to reach distally. Pt requesting to remove bulky head dressing that was intended to keep on R posterior ear bandage. Pt was assisted in hair care to ensure protection of staples and R posterior ear wound. Edu provided on BLE edema and need to ensure ted hose are pulled up all the way to prevent indention. Pt completed 150 ft of functional mobility to the therapy gym with the RW at supervision level overall. Pt completed functional stepping activity, with inversion at the RLE observed. Pt reporting this is likely d/t fatigue and activity was graded down to reduce risk of ankle injury. Pt returned to her room and was left supine with all needs met. Bed alarm set.   Therapy Documentation Precautions:  Precautions Precautions: Fall Restrictions Weight Bearing Restrictions: No  Therapy/Group: Individual Therapy  Curtis Sites 09/21/2020, 7:23 AM

## 2020-09-21 NOTE — Progress Notes (Signed)
Physical Therapy Session Note  Patient Details  Name: [name] Page MRN: [id number] Date of Birth: 18-Feb-1951  Today's Date: 09/21/2020 PT Individual Time: 0800-0855 PT Individual Time Calculation (min): 55 min   Short Term Goals: Week 1:  PT Short Term Goal 1 (Week 1): STG=LTG due to ELOS  Skilled Therapeutic Interventions/Progress Updates:    Pt received seated in bed, agreeable to PT session. No complaints of pain this AM and reports pain is being better controlled. Bed mobility at mod I level with use of bedrail and HOB elevated. Sit to stand with Supervision to RW. Standing balance at sink with no UE support and close Supervision while performing oral hygiene. Assisted pt with donning knee high TEDs and tennis shoes while seated EOB. Ambulation x 200 ft with RW at Supervision level. Ambulation 2 x 100 ft with no AD and CGA, onset of ataxic gait and decreased overall balance with onset of fatigue and without use of AD. Ascend/descend 4 x 6" stairs with 2 handrails and CGA to min A for balance, step-through gait pattern. Seated BUE and core strengthening with 2 kg weighted ball: punch-outs, bicep curls, L/R PNF diagonals 2 x 10 reps each. Pt returned to bed at end of session, mod I for bed mobility. Pt left seated in bed with needs in reach, bed alarm in place.  Therapy Documentation Precautions:  Precautions Precautions: Fall Restrictions Weight Bearing Restrictions: No   Therapy/Group: Individual Therapy   Peter Congo, PT, DPT  09/21/2020, 9:24 AM

## 2020-09-21 NOTE — Progress Notes (Signed)
[facility] PHYSICAL MEDICINE & REHABILITATION PROGRESS NOTE   Subjective/Complaints: Patient seen ambulating to restroom this AM.  She states she slept well overnight.  Noted to have increased drainage behind right ear overnight.  ROS: Denies CP, SOB, N/V/D  Objective:   No results found. No results for input(s): WBC, HGB, HCT, PLT in the last 72 hours. No results for input(s): NA, K, CL, CO2, GLUCOSE, BUN, CREATININE, CALCIUM in the last 72 hours.  Intake/Output Summary (Last 24 hours) at 09/21/2020 1756 Last data filed at 09/21/2020 1300 Gross per 24 hour  Intake 480 ml  Output --  Net 480 ml        Physical Exam: Vital Signs Blood pressure 140/63, pulse 64, temperature 98.4 F (36.9 C), temperature source Oral, resp. rate 17, height 5\' 2"  (1.575 m), weight 72.8 kg, SpO2 97 %.  Constitutional: No distress . Vital signs reviewed. HENT: Normocephalic.  Right craniotomy. Eyes: EOMI. No discharge. Cardiovascular: No JVD.  RRR. Respiratory: Normal effort.  No stridor.  Bilateral clear to auscultation. GI: Non-distended.  BS +. Skin: Warm and dry.  Craniotomy site with staples CDI Posterior right ear bleeding. Psych: Normal mood.  Normal behavior. Musc: Right > Left lower extremity edema, improving No tenderness in extremities. Neuro: Alert Makes eye contact with examiner.   Follows commands. Motor: RUE: 4+/5 proximal distal RLE: 4-/5 proximal distally, stable LUE: Shoulder abduction 2+/5 (baseline), distally 4/5 Left lower extremity: 4-/5 proximal distal, stable Generalized tremors,?  Improving  Assessment/Plan: 1. Functional deficits which require 3+ hours per day of interdisciplinary therapy in a comprehensive inpatient rehab setting.  Physiatrist is providing close team supervision and 24 hour management of active medical problems listed below.  Physiatrist and rehab team continue to assess barriers to discharge/monitor patient progress toward functional and medical  goals  Care Tool:  Bathing    Body parts bathed by patient: Right arm,Left arm,Chest,Abdomen,Front perineal area,Buttocks,Left upper leg,Right upper leg,Right lower leg,Left lower leg,Face         Bathing assist Assist Level: Minimal Assistance - Patient > 75%     Upper Body Dressing/Undressing Upper body dressing   What is the patient wearing?: Pull over shirt    Upper body assist Assist Level: Contact Guard/Touching assist    Lower Body Dressing/Undressing Lower body dressing      What is the patient wearing?: Underwear/pull up     Lower body assist Assist for lower body dressing: Contact Guard/Touching assist     Toileting Toileting    Toileting assist Assist for toileting: Contact Guard/Touching assist     Transfers Chair/bed transfer  Transfers assist     Chair/bed transfer assist level: Supervision/Verbal cueing     Locomotion Ambulation   Ambulation assist      Assist level: Supervision/Verbal cueing Assistive device: Walker-rolling Max distance: 200'   Walk 10 feet activity   Assist     Assist level: Supervision/Verbal cueing Assistive device: Walker-rolling   Walk 50 feet activity   Assist    Assist level: Supervision/Verbal cueing Assistive device: Walker-rolling    Walk 150 feet activity   Assist Walk 150 feet activity did not occur: Safety/medical concerns  Assist level: Supervision/Verbal cueing Assistive device: Walker-rolling    Walk 10 feet on uneven surface  activity   Assist Walk 10 feet on uneven surfaces activity did not occur: Safety/medical concerns         Wheelchair     Assist   Type of Wheelchair:  assist level: Supervision/Verbal cueing Max wheelchair distance: 37ft    Wheelchair 50 feet with 2 turns activity    Assist        Assist Level: Supervision/Verbal cueing   Wheelchair 150 feet activity     Assist      Assist Level: Moderate Assistance -  Patient 50 - 74%   Blood pressure 140/63, pulse 64, temperature 98.4 F (36.9 C), temperature source Oral, resp. rate 17, height 5\' 2"  (1.575 m), weight 72.8 kg, SpO2 97 %.     Medical Problem List and Plan: 1.  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  Follow-up Dr. Duffy Rhody.  Decadron protocol as indicated  Continue CIR 2.  Antithrombotics: -DVT/anticoagulation: Lovenox.              -antiplatelet therapy: N/A 3. Pain Management: Hydrocodone as needed  Controlled with meds on 1/2             Monitor, particularly for headaches with increased exertion 4. Mood: Lexapro 10 mg daily- change to nightly as she takes at home             -antipsychotic agents: N/A 5. Neuropsych: This patient is capable of making decisions on her own behalf. 6. Skin/Wound Care: Routine skin checks.   Change dressing to posterior right ear as necessary 7. Fluids/Electrolytes/Nutrition: Routine in and outs 8.  Seizure prophylaxis.  Keppra 500 mg twice daily 9.  Hypertension.    Hydralazine 10 mg every 8 hours increased to 50mg   Propanolol started on 1/1 -  Borderline bradycardia, may need to DC medication if persistent/symptomatic             Monitor increase mobility 10.  History of renal stones.  Follow-up outpatient urology services 11.  Hyperlipidemia: Lipitor 12.  Constipation.  Colace 100 mg twice daily, MiraLAX as needed 13.  Iron deficiency anemia.  Continue iron supplement             Hemoglobin 8.8 on 12/30, labs ordered for tomorrow 14.  Generalized tremors - longstanding.   Propanolol 10mg  started on 1/1 with?  Improvement 15. ALT elevated: tylenol d/ced. Discussed with patient.   Continue to monitor 16. Suspicious bloody lesion behind ear- will get further evaluation given cancer history.  17.  Hypoalbuminemia  Supplement initiated  on 1/1 18.  Peripheral edema  Bumex started on 1/1  CMP ordered for tomorrow  LOS: 4 days A FACE TO FACE EVALUATION WAS PERFORMED  [name] Page [name] Page 09/21/2020, 5:56 PM

## 2020-09-22 ENCOUNTER — Inpatient Hospital Stay (HOSPITAL_COMMUNITY): Payer: Medicare Other | Admitting: Occupational Therapy

## 2020-09-22 ENCOUNTER — Inpatient Hospital Stay (HOSPITAL_COMMUNITY): Payer: Medicare Other

## 2020-09-22 ENCOUNTER — Inpatient Hospital Stay (HOSPITAL_COMMUNITY): Payer: Medicare Other | Admitting: Speech Pathology

## 2020-09-22 LAB — COMPREHENSIVE METABOLIC PANEL
ALT: 71 U/L — ABNORMAL HIGH (ref 0–44)
AST: 42 U/L — ABNORMAL HIGH (ref 15–41)
Albumin: 3.2 g/dL — ABNORMAL LOW (ref 3.5–5.0)
Alkaline Phosphatase: 60 U/L (ref 38–126)
Anion gap: 13 (ref 5–15)
BUN: 21 mg/dL (ref 8–23)
CO2: 29 mmol/L (ref 22–32)
Calcium: 9.2 mg/dL (ref 8.9–10.3)
Chloride: 97 mmol/L — ABNORMAL LOW (ref 98–111)
Creatinine, Ser: 1.07 mg/dL — ABNORMAL HIGH (ref 0.44–1.00)
GFR, Estimated: 56 mL/min — ABNORMAL LOW (ref >60)
Glucose, Bld: 115 mg/dL — ABNORMAL HIGH (ref 70–99)
Potassium: 3.9 mmol/L (ref 3.5–5.1)
Sodium: 139 mmol/L (ref 135–145)
Total Bilirubin: 0.7 mg/dL (ref 0.3–1.2)
Total Protein: 6.1 g/dL — ABNORMAL LOW (ref 6.5–8.1)

## 2020-09-22 LAB — CBC WITH DIFFERENTIAL/PLATELET
Abs Immature Granulocytes: 0.03 10*3/uL (ref 0.00–0.07)
Basophils Absolute: 0 10*3/uL (ref 0.0–0.1)
Basophils Relative: 0 %
Eosinophils Absolute: 0 10*3/uL (ref 0.0–0.5)
Eosinophils Relative: 1 %
HCT: 33.2 % — ABNORMAL LOW (ref 36.0–46.0)
Hemoglobin: 9.9 g/dL — ABNORMAL LOW (ref 12.0–15.0)
Immature Granulocytes: 1 %
Lymphocytes Relative: 16 %
Lymphs Abs: 1.1 10*3/uL (ref 0.7–4.0)
MCH: 24.4 pg — ABNORMAL LOW (ref 26.0–34.0)
MCHC: 29.8 g/dL — ABNORMAL LOW (ref 30.0–36.0)
MCV: 82 fL (ref 80.0–100.0)
Monocytes Absolute: 0.6 10*3/uL (ref 0.1–1.0)
Monocytes Relative: 10 %
Neutro Abs: 4.8 10*3/uL (ref 1.7–7.7)
Neutrophils Relative %: 72 %
Platelets: 164 10*3/uL (ref 150–400)
RBC: 4.05 MIL/uL (ref 3.87–5.11)
RDW: 21.4 % — ABNORMAL HIGH (ref 11.5–15.5)
WBC: 6.6 10*3/uL (ref 4.0–10.5)
nRBC: 0 % (ref 0.0–0.2)

## 2020-09-22 NOTE — Progress Notes (Signed)
Physical Therapy Session Note  Patient Details  Name: [name] Page MRN: [id number] Date of Birth: 11/08/1950  Today's Date: 09/22/2020 PT Individual Time: 0904-1000 PT Individual Time Calculation (min): 56 min   Short Term Goals: Week 1:  PT Short Term Goal 1 (Week 1): STG=LTG due to ELOS  Skilled Therapeutic Interventions/Progress Updates:     Pt received seated at EOB and agrees to therapy. No complaint of pain. Stand step transfer to [facility] with supervision. PT dons TED hose for BP management and edema control. WC transport to gym for time management. Pt ambulates x120' x2 with supervision and no AD. Pt then ambulates 240' x2 with seated rest break and same assist. PT cues for upright gaze to improve posture and balance, lateral weight shifting for improved reciprocal gait pattern, increased trunk rotation and arm swing for balance, and increased gait speed to decrease risk for falls.  Pt performs NMR for standing balance and activity tolerance. Pt performs toe taps on cone with alternating legs. Pt loses balance occasionally with activity, requiring minA from PT for safety. Pt able to complete max of x20 reps with CGA. Frequent seated rest breaks due to fatigue in legs. Pt progresses to performing toe tapping activity on 3 multicolored cones with PT providing cues for laterality and color of cone. Pt requires minA overall for activity with multiple LOBs, but responds well to cueing on breaking task down into individual components.  Stand step transfer back to recliner with cues on positioning. Pt left seated in recliner with all needs within reach.  Therapy Documentation Precautions:  Precautions Precautions: Fall Restrictions Weight Bearing Restrictions: No    Therapy/Group: Individual Therapy  Beau Fanny, PT, DPT 09/22/2020, 9:23 AM

## 2020-09-22 NOTE — Progress Notes (Signed)
Speech Language Pathology Daily Session Note  Patient Details  Name: ANGELLINA FERDINAND MRN: [id number] Date of Birth: 1951/02/16  Today's Date: 09/22/2020 SLP Individual Time: 1010-1105 SLP Individual Time Calculation (min): 55 min  Short Term Goals: Week 1: SLP Short Term Goal 1 (Week 1): STGs=LTGs due to ELOS  Skilled Therapeutic Interventions: Skilled treatment session focused on cognitive goals. SLP facilitated session with a recall task that focused on medications. Patient was overall Mod I for recall of her current medications and their functions. Patient was also overall Mod I to generate a list of activities she can safely participate in at home and identifying tasks she will need assistance with from family. Patient also recalled events from previous therapy session with Mod I. Patient left upright in bed with alarm on and all needs within reach. Continue with current plan of care.   Pain No/Denies Pain   Therapy/Group: Individual Therapy  Lachae Hohler 09/22/2020, 3:19 PM

## 2020-09-22 NOTE — Progress Notes (Signed)
Occupational Therapy Session Note  Patient Details  Name: ROSARY FILOSA MRN: [id number] Date of Birth: 1950-12-15  Today's Date: 09/22/2020 OT Individual Time: 1415-1530 OT Individual Time Calculation (min): 75 min    Short Term Goals: Week 1:  OT Short Term Goal 1 (Week 1): LTG=STG 2/2 ELOS  Skilled Therapeutic Interventions/Progress Updates:    Patient in bed, alert and ready for therapy session, she denies pain and requests to wash and change clothing to start session.  Supine to sitting edge of bed with CS.  Short distance ambulation with RW to/from bed and w/c with CG/CS.  She completes sponge bath w/c level at sink with set up/CS.  Reviewed options for set up of sponge bath in home environment.  Min A to donn OH shirt due to tight neck line of shirt.  Set up/CS for underwear, pants and slipper socks.  She is able to pull over hips in stance with CS.  Dependent to donn teds.   Completed seated and standing conditioning activities with focus on balance, trunk mobility and standing tolerance.  Completed seated posture and scapular mobility activities, reviewed support options to promote improved posture in seated positions.  She ambulated 30 feet w/c to bed at close of session with CS.  She returned to bed with CS and remained there.  Bed alarm set and call bell/tray table in reach.    Therapy Documentation Precautions:  Precautions Precautions: Fall Restrictions Weight Bearing Restrictions: No  Therapy/Group: Individual Therapy  Barrie Lyme 09/22/2020, 7:54 AM

## 2020-09-22 NOTE — Progress Notes (Signed)
[facility] PHYSICAL MEDICINE & REHABILITATION PROGRESS NOTE   Subjective/Complaints: Up in bed. No new complaints. Still drainage from right scalp/ear.   ROS: Patient denies fever, rash, sore throat, blurred vision, nausea, vomiting, diarrhea, cough, shortness of breath or chest pain, joint or back pain, headache, or mood change.    Objective:   No results found. Recent Labs    09/22/20 0704  WBC 6.6  HGB 9.9*  HCT 33.2*  PLT 164   Recent Labs    09/22/20 0704  NA 139  K 3.9  CL 97*  CO2 29  GLUCOSE 115*  BUN 21  CREATININE 1.07*  CALCIUM 9.2    Intake/Output Summary (Last 24 hours) at 09/22/2020 1005 Last data filed at 09/22/2020 0730 Gross per 24 hour  Intake 648 ml  Output --  Net 648 ml        Physical Exam: Vital Signs Blood pressure (!) 126/54, pulse 61, temperature (!) 97.4 F (36.3 C), resp. rate 17, height 5\' 2"  (1.575 m), weight 72.8 kg, SpO2 97 %.  Constitutional: No distress . Vital signs reviewed. HEENT: EOMI, oral membranes moist Neck: supple Cardiovascular: RRR without murmur. No JVD    Respiratory/Chest: CTA Bilaterally without wheezes or rales. Normal effort    GI/Abdomen: BS +, non-tender, non-distended Ext: no clubbing, cyanosis, or edema Psych: pleasant and cooperative Skin: prominent right crani incision with staples. S/s drainage from behind right ear. Dressing in place. Left chest wound Musc: Right > Left lower extremity edema is minimal No tenderness in extremities. Neuro: Alert. Good insight and awareness.  Makes eye contact with examiner.   Follows commands. Motor: RUE: 4 to 4+/5 proximal distal RLE: 4-/5 proximal distally, stable LUE: Shoulder abduction 2+/5 (at baseline), distally 4/5 Left lower extremity: 4-/5 proximal distal, stable Generalized tremors  +/-  Assessment/Plan: 1. Functional deficits which require 3+ hours per day of interdisciplinary therapy in a comprehensive inpatient rehab setting.  Physiatrist is  providing close team supervision and 24 hour management of active medical problems listed below.  Physiatrist and rehab team continue to assess barriers to discharge/monitor patient progress toward functional and medical goals  Care Tool:  Bathing    Body parts bathed by patient: Right arm,Left arm,Chest,Abdomen,Front perineal area,Buttocks,Left upper leg,Right upper leg,Right lower leg,Left lower leg,Face         Bathing assist Assist Level: Minimal Assistance - Patient > 75%     Upper Body Dressing/Undressing Upper body dressing   What is the patient wearing?: Pull over shirt    Upper body assist Assist Level: Contact Guard/Touching assist    Lower Body Dressing/Undressing Lower body dressing      What is the patient wearing?: Underwear/pull up     Lower body assist Assist for lower body dressing: Contact Guard/Touching assist     Toileting Toileting    Toileting assist Assist for toileting: Contact Guard/Touching assist     Transfers Chair/bed transfer  Transfers assist     Chair/bed transfer assist level: Supervision/Verbal cueing     Locomotion Ambulation   Ambulation assist      Assist level: Supervision/Verbal cueing Assistive device: Walker-rolling Max distance: 200'   Walk 10 feet activity   Assist     Assist level: Supervision/Verbal cueing Assistive device: Walker-rolling   Walk 50 feet activity   Assist    Assist level: Supervision/Verbal cueing Assistive device: Walker-rolling    Walk 150 feet activity   Assist Walk 150 feet activity did not occur: Safety/medical concerns  Assist  level: Supervision/Verbal cueing Assistive device: Walker-rolling    Walk 10 feet on uneven surface  activity   Assist Walk 10 feet on uneven surfaces activity did not occur: Safety/medical concerns         Wheelchair     Assist   Type of Wheelchair: Manual    Wheelchair assist level: Supervision/Verbal cueing Max  wheelchair distance: 73ft    Wheelchair 50 feet with 2 turns activity    Assist        Assist Level: Supervision/Verbal cueing   Wheelchair 150 feet activity     Assist      Assist Level: Moderate Assistance - Patient 50 - 74%   Blood pressure (!) 126/54, pulse 61, temperature (!) 97.4 F (36.3 C), resp. rate 17, height 5\' 2"  (1.575 m), weight 72.8 kg, SpO2 97 %.     Medical Problem List and Plan: 1.  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  Follow-up Dr. 09/13/2020.  Decadron protocol as indicated  Continue CIR 2.  Antithrombotics: -DVT/anticoagulation: Lovenox.              -antiplatelet therapy: N/A 3. Pain Management: Hydrocodone as needed  Controlled with meds on 1/3             Monitor, particularly for headaches with increased exertion 4. Mood: Lexapro 10 mg daily- change to nightly as she takes at home             -antipsychotic agents: N/A 5. Neuropsych: This patient is capable of making decisions on her own behalf. 6. Skin/Wound Care: Routine skin checks.   Continue dry dressing to posterior right ear as necessary. Still with s/s drainage but it appears clean otherwise 7. Fluids/Electrolytes/Nutrition: Routine in and outs 8.  Seizure prophylaxis.  Keppra 500 mg twice daily 9.  Hypertension.    Hydralazine 10 mg every 8 hours increased to 50mg   Propanolol started on 1/1 - HR in 60's 10.  History of renal stones.  Follow-up outpatient urology services 11.  Hyperlipidemia: Lipitor 12.  Constipation.  Colace 100 mg twice daily, MiraLAX as needed 13.  Iron deficiency anemia.  Continue iron supplement             Hemoglobin 8.8 on 12/30--> 9.9 1/3 14.  Generalized tremors - longstanding.   Propanolol 10mg  started on 1/1 with some apparent  Improvement   -continue to monitor 15. ALT elevated:  tylenol d/ced. Discussed with patient.   Down to 42 1/3 16. Suspicious bloody lesion behind ear- will get further evaluation given cancer history.  17.  Hypoalbuminemia  Supplement initiated on 1/1 18.  Peripheral edema  Bumex started on 1/1--appears to have resolved 1/3, bp's a little soft at times   Hold bumex   LOS: 5 days A FACE TO FACE EVALUATION WAS PERFORMED  Hoyt Koch 09/22/2020, 10:05 AM

## 2020-09-22 NOTE — Progress Notes (Signed)
Slept good. Dressings to left chest, lower back and right post ear, all clean dry and intact. Left chest wound is malodorous. Right crani incision with staples, OTA. Ambulating with RW with CGA. Denies pain. [name] Page

## 2020-09-23 ENCOUNTER — Inpatient Hospital Stay (HOSPITAL_COMMUNITY): Payer: Medicare Other | Admitting: Speech Pathology

## 2020-09-23 ENCOUNTER — Inpatient Hospital Stay (HOSPITAL_COMMUNITY): Payer: Medicare Other

## 2020-09-23 ENCOUNTER — Inpatient Hospital Stay (HOSPITAL_COMMUNITY): Payer: Medicare Other | Admitting: Occupational Therapy

## 2020-09-23 MED ORDER — SILVER NITRATE-POT NITRATE 75-25 % EX MISC
1.0000 "application " | CUTANEOUS | Status: DC | PRN
  Filled 2020-09-23: qty 1

## 2020-09-23 NOTE — Progress Notes (Signed)
Occupational Therapy Discharge Summary  Patient Details  Name: [name] Page MRN: [id number] Date of Birth: [birth year]   Patient has met 11 of 11 long term goals due to improved activity tolerance, improved balance, postural control and ability to compensate for deficits.  Patient to discharge at overall Mod I/ Supervision level.  Patient's care partner is independent to provide the necessary physical assistance at discharge.    Reasons goals not met: n/a  Recommendation:  Patient will benefit from ongoing skilled OT services in outpatient setting to continue to advance functional skills in the area of BADL.  Equipment: 3-in-1 BSC  Reasons for discharge: treatment goals met and discharge from hospital  Patient/family agrees with progress made and goals achieved: Yes  OT Discharge Precautions/Restrictions  Precautions Precautions: Fall ADL ADL Eating: Independent Grooming: Independent Upper Body Bathing: Independent Lower Body Bathing: Supervision/safety Upper Body Dressing: Supervision/safety Lower Body Dressing: Supervision/safety Toileting: Supervision/safety Toilet Transfer Method: Ambulating Tub/Shower Transfer: Close supervison Vision Baseline Vision/History: Wears glasses Wears Glasses: At all times Patient Visual Report: No change from baseline Eye Alignment: Within Functional Limits Ocular Range of Motion: Within Functional Limits Visual Fields: No apparent deficits Perception  Perception: Within Functional Limits Praxis Praxis: Intact Cognition Overall Cognitive Status: Within Functional Limits for tasks assessed Sensation Sensation Light Touch: Appears Intact Hot/Cold: Appears Intact Proprioception: Appears Intact Stereognosis: Appears Intact Coordination Gross Motor Movements are Fluid and Coordinated: Yes Fine Motor Movements are Fluid and Coordinated: Yes Motor  Motor Motor - Skilled Clinical Observations: resting tremor BLE/BUE L>R. Mobility   Transfers Sit to Stand: Supervision/Verbal cueing Stand to Sit: Supervision/Verbal cueing  Trunk/Postural Assessment  Cervical Assessment Cervical Assessment: Within Functional Limits Thoracic Assessment Thoracic Assessment: Within Functional Limits Lumbar Assessment Lumbar Assessment: Within Functional Limits Postural Control Postural Control: Within Functional Limits  Balance Static Sitting Balance Static Sitting - Balance Support: Feet supported Static Sitting - Level of Assistance: 7: Independent Dynamic Sitting Balance Dynamic Sitting - Balance Support: During functional activity Dynamic Sitting - Level of Assistance: 7: Independent Static Standing Balance Static Standing - Balance Support: During functional activity Static Standing - Level of Assistance: 5: Stand by assistance Dynamic Standing Balance Dynamic Standing - Balance Support: During functional activity Dynamic Standing - Level of Assistance: 5: Stand by assistance Extremity/Trunk Assessment RUE Assessment RUE Assessment: Within Functional Limits LUE Assessment LUE Assessment: Exceptions to [facility] Active Range of Motion (AROM) Comments: 15 degrees active FF- old shoulder injury LUE Strength Left Shoulder Flexion: 2+/5   Erlene Devita 09/24/2020, 12:44 PM

## 2020-09-23 NOTE — Progress Notes (Signed)
Changed dressing on wound behind rt ear.

## 2020-09-23 NOTE — Patient Care Conference (Signed)
Inpatient RehabilitationTeam Conference and Plan of Care Update Date: 09/23/2020   Time: 10:44 AM    Patient Name: [name] Page      Medical Record Number: [id number]  Date of Birth: 08-05-1951 Sex: Female         Room/Bed: 4W20C/4W20C-01 Payor Info: Payor: MEDICARE / Plan: MEDICARE PART A AND B / Product Type: *No Product type* /    Admit Date/Time:  09/17/2020  3:27 PM  Primary Diagnosis:  Meningioma [facility])  Hospital Problems: Principal Problem:   Meningioma [facility]) Active Problems:   Peripheral edema   Hypoalbuminemia due to protein-calorie malnutrition ([place])   Transaminitis   Tremor    Expected Discharge Date: Expected Discharge Date: 09/25/20  Team Members Present: Physician leading conference: Dr. Alger Simons Care Coodinator Present: Dorien Chihuahua, RN, BSN, CRRN;Christina Sampson Goon, BSW Nurse Present: Isla Pence, RN PT Present: Tereasa Coop, PT OT Present: Cherylynn Ridges, OT SLP Present: Weston Anna, SLP PPS Coordinator present : Gunnar Fusi, Novella Olive, PT     Current Status/Progress Goal Weekly Team Focus  Bowel/Bladder   Continent of bowel and bladder  Remain continent.  Toilet PRN   Swallow/Nutrition/ Hydration             ADL's   upper body adl min A for [place] shirt at times due to ear and incision, lower body adl set up / CS (with exception of donning teds - max A/dep), functional transfers CS/CGA  CS/set up  family edcuation, adl and functional transfer training, general conditioning   Mobility   mod(I) bed mobility, supervision transfers, ambulation 150' without AD  mod(I)  DC prep, endurance   Communication             Safety/Cognition/ Behavioral Observations  Supervision-Mod I  Mod I  complex problem solving and recall   Pain   0/10 Pain reported.  Free of pain.  Assess pain Q shift.   Skin   Multiple cancerous lesions, surgical incision across forehead closed by staple.  Free of infection and healing of wounds.  Assess skin Q shift  and PRN.     Discharge Planning:  Patient plans to discharge home alone, sister and daughter live across the street. Ramp currently being build on home   Team Discussion: Crani site healing. Concerns about lesions noted behind right ear. MD to consult dermatology for care recommendations. BP elevated but managed with medications.  Patient on target to meet rehab goals: yes, currently supervision overall with ADLs and mobility. Progress impaired by fatigue.  *See Care Plan and progress notes for long and short-term goals.   Revisions to Treatment Plan:   Teaching Needs: Skin care, medications, etc.   Current Barriers to Discharge: Decreased caregiver support, Home enviroment access/layout and Wound care  Possible Resolutions to Barriers: Sister and daughter nearby; family education    Medical Summary Current Status: mengioma s/p resection. mild left hp. bp fluctuations, lesions on right scalp, chest suspicious for CA  Barriers to Discharge: Medical stability   Possible Resolutions to Barriers/Weekly Focus: continued wound care, medical mgt, daily assessment of pt data. ?derm consult   Continued Need for Acute Rehabilitation Level of Care: The patient requires daily medical management by a physician with specialized training in physical medicine and rehabilitation for the following reasons: Direction of a multidisciplinary physical rehabilitation program to maximize functional independence : Yes Medical management of patient stability for increased activity during participation in an intensive rehabilitation regime.: Yes Analysis of laboratory values and/or radiology reports with  any subsequent need for medication adjustment and/or medical intervention. : Yes   I attest that I was present, lead the team conference, and concur with the assessment and plan of the team.   Chana Bode B 09/23/2020, 3:23 PM

## 2020-09-23 NOTE — Progress Notes (Signed)
Patient ID: [name] Page, female   DOB: [birth year], 70 y.o.   MRN: [id number] Team Conference Report to Patient/Family  Team Conference discussion was reviewed with the patient and caregiver, including goals, any changes in plan of care and target discharge date.  Patient and caregiver express understanding and are in agreement.  The patient has a target discharge date of 09/25/20.  Andria Rhein 09/23/2020, 1:06 PM

## 2020-09-23 NOTE — Progress Notes (Signed)
Patient ID: [name] Page, female   DOB: [birth year], 70 y.o.   MRN: [id number]  [facility] ordered through adapt.

## 2020-09-23 NOTE — Progress Notes (Signed)
[place] PHYSICAL MEDICINE & REHABILITATION PROGRESS NOTE   Subjective/Complaints: Sitting up putting on shoes. No new issues. bp up this morning  ROS: Patient denies fever, rash, sore throat, blurred vision, nausea, vomiting, diarrhea, cough, shortness of breath or chest pain, joint or back pain, headache, or mood change.    Objective:   No results found. Recent Labs    09/22/20 0704  WBC 6.6  HGB 9.9*  HCT 33.2*  PLT 164   Recent Labs    09/22/20 0704  NA 139  K 3.9  CL 97*  CO2 29  GLUCOSE 115*  BUN 21  CREATININE 1.07*  CALCIUM 9.2    Intake/Output Summary (Last 24 hours) at 09/23/2020 1047 Last data filed at 09/23/2020 0900 Gross per 24 hour  Intake 471 ml  Output --  Net 471 ml        Physical Exam: Vital Signs Blood pressure (!) 157/64, pulse (!) 57, temperature 98 F (36.7 C), temperature source Oral, resp. rate 15, height 5\' 2"  (1.575 m), weight 72.8 kg, SpO2 97 %.  Constitutional: No distress . Vital signs reviewed. HEENT: EOMI, oral membranes moist Neck: supple Cardiovascular: RRR without murmur. No JVD    Respiratory/Chest: CTA Bilaterally without wheezes or rales. Normal effort    GI/Abdomen: BS +, non-tender, non-distended Ext: no clubbing, cyanosis, or edema Psych: pleasant and cooperative Skin: prominent right crani incision with staples. S/s drainage from behind right ear from silver dollar sized raised lesion--dry dressing over top. Left chest wound Musc: Right > Left lower extremity edema is minimal No tenderness in extremities. Neuro: Alert and oriented x 3. Normal insight and awareness. Intact Memory. Normal language and speech. Cranial nerve exam unremarkable  Motor: RUE: 4 to 4+/5 proximal distal RLE: 4-/5 proximal distally, stable LUE: Shoulder abduction 2+/5 (near baseline), distally 4/5 Left lower extremity: 4-/5 proximal distal, stable Generalized tremors not evident today  Assessment/Plan: 1. Functional deficits which  require 3+ hours per day of interdisciplinary therapy in a comprehensive inpatient rehab setting.  Physiatrist is providing close team supervision and 24 hour management of active medical problems listed below.  Physiatrist and rehab team continue to assess barriers to discharge/monitor patient progress toward functional and medical goals  Care Tool:  Bathing    Body parts bathed by patient: Right arm,Left arm,Chest,Abdomen,Front perineal area,Buttocks,Left upper leg,Right upper leg,Right lower leg,Left lower leg,Face         Bathing assist Assist Level: Contact Guard/Touching assist     Upper Body Dressing/Undressing Upper body dressing   What is the patient wearing?: Pull over shirt    Upper body assist Assist Level: Minimal Assistance - Patient > 75%    Lower Body Dressing/Undressing Lower body dressing      What is the patient wearing?: Underwear/pull up,Pants     Lower body assist Assist for lower body dressing: Supervision/Verbal cueing     Toileting Toileting    Toileting assist Assist for toileting: Contact Guard/Touching assist     Transfers Chair/bed transfer  Transfers assist     Chair/bed transfer assist level: Supervision/Verbal cueing     Locomotion Ambulation   Ambulation assist      Assist level: Contact Guard/Touching assist Assistive device: No Device Max distance: 240'   Walk 10 feet activity   Assist     Assist level: Contact Guard/Touching assist Assistive device: Orthosis   Walk 50 feet activity   Assist    Assist level: Contact Guard/Touching assist Assistive device: No Device  Walk 150 feet activity   Assist Walk 150 feet activity did not occur: Safety/medical concerns  Assist level: Contact Guard/Touching assist Assistive device: No Device    Walk 10 feet on uneven surface  activity   Assist Walk 10 feet on uneven surfaces activity did not occur: Safety/medical concerns          Wheelchair     Assist Will patient use wheelchair at discharge?: No (Per PT long term goals) Type of Wheelchair: Manual    Wheelchair assist level: Supervision/Verbal cueing Max wheelchair distance: 49ft    Wheelchair 50 feet with 2 turns activity    Assist        Assist Level: Supervision/Verbal cueing   Wheelchair 150 feet activity     Assist      Assist Level: Moderate Assistance - Patient 50 - 74%   Blood pressure (!) 157/64, pulse (!) 57, temperature 98 F (36.7 C), temperature source Oral, resp. rate 15, height 5\' 2"  (1.575 m), weight 72.8 kg, SpO2 97 %.     Medical Problem List and Plan: 1.  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  Follow-up Dr. Duffy Rhody.  Decadron protocol as indicated  Continue CIR, team conference today  -ELOS 1/6 2.  Antithrombotics: -DVT/anticoagulation: Lovenox.              -antiplatelet therapy: N/A 3. Pain Management: Hydrocodone as needed  Controlled with meds on 1/4             Monitor, particularly for headaches with increased exertion 4. Mood: Lexapro 10 mg daily- change to nightly as she takes at home             -antipsychotic agents: N/A 5. Neuropsych: This patient is capable of making decisions on her own behalf. 6. Skin/Wound Care: Routine skin checks.   Large right facial lesion, left chest lesion rather concerning.   Spoke to Dr. Pearline Cables (dermatology) today who will see her in office next week on 09/30/20 at 12:15   -will try silver nitrate to bleeding are today 7. Fluids/Electrolytes/Nutrition: Routine in and outs 8.  Seizure prophylaxis.  Keppra 500 mg twice daily 9.  Hypertension.    Hydralazine 10 mg every 8 hours increased to 50mg   Propanolol started on 1/1 - HR in 60's 10.  History of renal stones.  Follow-up outpatient urology services 11.   Hyperlipidemia: Lipitor 12.  Constipation.  Colace 100 mg twice daily, MiraLAX as needed 13.  Iron deficiency anemia.  Continue iron supplement             Hemoglobin 8.8 on 12/30--> 9.9 1/3 14.  Generalized tremors - longstanding.   Propanolol 10mg  started on 1/1 with some apparent  Improvement   -continue to monitor 15. ALT elevated: tylenol d/ced. Discussed with patient.   Down to 24 1/3 66. Suspicious bloody lesion behind ear- will get further evaluation given cancer history.  17.  Hypoalbuminemia  Supplement initiated on 1/1 18.  Peripheral edema  Bumex started on 1/1--appears to have resolved 1/3    Continue to Hold bumex   LOS: 6 days A FACE TO [place] 09/23/2020, 10:47 AM

## 2020-09-23 NOTE — Plan of Care (Signed)
  Problem: Consults Goal: RH GENERAL PATIENT EDUCATION Description: See Patient Education module for education specifics. Outcome: Progressing Goal: Skin Care Protocol Initiated - if Braden Score 18 or less Description: If consults are not indicated, leave blank or document N/A Outcome: Progressing Goal: Nutrition Consult-if indicated Outcome: Progressing   Problem: RH SKIN INTEGRITY Goal: RH STG SKIN FREE OF INFECTION/BREAKDOWN Description: Skin to remain free from additional breakdown or infection with supervision assist. Outcome: Progressing Goal: RH STG MAINTAIN SKIN INTEGRITY WITH ASSISTANCE Description: STG Maintain Skin Integrity With  min Assistance. Outcome: Progressing Goal: RH STG ABLE TO PERFORM INCISION/WOUND CARE W/ASSISTANCE Description: STG Able To Perform Incision/Wound Care With supervision Assistance. Outcome: Progressing   Problem: RH SAFETY Goal: RH STG ADHERE TO SAFETY PRECAUTIONS W/ASSISTANCE/DEVICE Description: STG Adhere to Safety Precautions With supervision Assistance/Device. Outcome: Progressing   Problem: RH PAIN MANAGEMENT Goal: RH STG PAIN MANAGED AT OR BELOW PT'S PAIN GOAL Description: <3 on a 0-10 pain scale. Outcome: Progressing   Problem: RH KNOWLEDGE DEFICIT GENERAL Goal: RH STG INCREASE KNOWLEDGE OF SELF CARE AFTER HOSPITALIZATION Description: Patient will be able to demonstrate knowledge of medication management, dietary management, skin/wound care management, and follow up care recommendations with educational materials and handouts with cues and reminders from staff. Outcome: Progressing   Problem: Consults Goal: RH BRAIN INJURY PATIENT EDUCATION Description: Description: See Patient Education module for eduction specifics Outcome: Progressing   

## 2020-09-23 NOTE — Discharge Summary (Signed)
Physician Discharge Summary  Patient ID: [name] Page MRN: [id number] DOB/AGE: Jan 15, 1951 70 y.o.  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 EXAM: CT HEAD WITHOUT CONTRAST TECHNIQUE: Contiguous axial images were obtained from the base of the skull through the vertex without intravenous contrast. COMPARISON:  Previous MRI from 09/05/2020. FINDINGS: Brain: Large meningioma positioned at the anterior right skull base again seen stable in size and appearance measuring up to 4 cm in diameter on this exam. Surrounding vasogenic edema and regional mass effect within the adjacent right cerebral hemisphere is unchanged. Right lateral ventricle partially effaced. Up to 9 mm right-to-left shift at the septum pellucidum with asymmetric dilatation of the left lateral ventricle, stable. No acute intracranial hemorrhage. No visible acute large vessel territory infarct. No other mass lesion or extra-axial fluid collection. Vascular: No abnormal hyperdense vessel. Skull: Scalp soft tissues within normal limits.  Calvarium intact. Sinuses/Orbits: Globes and orbital soft tissues demonstrate no acute finding. Paranasal sinuses are clear. No mastoid effusion. Other: None. IMPRESSION: 1. Brain Stealth protocol for preoperative planning and guidance. 2. Large approximate  4 cm meningioma at the anterior right skull base with associated regional mass effect right-to-left midline shift, stable. 3. No other acute intracranial abnormality. Electronically Signed   By: Jeannine Boga M.D.   On: 09/10/2020 02:12   CT Head W or Wo Contrast  Result Date: 09/04/2020 CLINICAL DATA:  Encephalopathy EXAM: CT HEAD WITHOUT AND WITH CONTRAST TECHNIQUE: Contiguous axial images were obtained from the base of the skull through the vertex without and with intravenous contrast CONTRAST:  96mL OMNIPAQUE IOHEXOL 300 MG/ML  SOLN COMPARISON:  None. FINDINGS: Brain: There is a homogeneously enhancing extra-axial mass of the anterior cranial fossa measuring 3.5 x 2.2 cm. There is a large amount of vasogenic edema in the overlying right frontal lobe. There is leftward midline shift with subfalcine herniation of the right cingulate gyrus. There is no intracranial hemorrhage or other extra-axial collection. The right lateral ventricle is effaced in the left is mildly dilated. Vascular: No hyperdense vessel or unexpected calcification. Visible vessels are patent. Skull: Normal. Negative for fracture or focal lesion. Sinuses/Orbits: No acute finding. Other: None. 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 There is leftward midline shift with subfalcine herniation of the right cingulate gyrus with early entrapment of the left lateral ventricle. Critical Value/emergent results were called by telephone at the time of interpretation on 09/04/2020 at 8:44 pm to provider GARRETT GREEN , who verbally acknowledged these results. Electronically Signed   By: Ulyses Jarred M.D.   On: 09/04/2020 20:46   MR BRAIN W WO CONTRAST  Result Date: 09/12/2020 CLINICAL DATA:  Meningioma resection. EXAM: MRI HEAD WITHOUT AND WITH CONTRAST TECHNIQUE: Multiplanar, multiecho pulse sequences of the brain and surrounding structures were obtained without and with  intravenous contrast. CONTRAST:  6.22mL GADAVIST GADOBUTROL 1 MMOL/ML IV SOLN COMPARISON:  Seven days ago FINDINGS: Brain: Resected meningioma  from the right paramedian anterior cranial fossa with no nodular residual, only wispy postoperative and vascular appearing enhancement. The degree of vasogenic edema is similar, and extensive, with midline shift of at least 1 cm. No change in ventricular volume with compression of the right lateral ventricle. No complicating infarct. Expected postoperative thin fluid collection deep to the craniotomy flap. No measurable hematoma. Vascular: Normal flow voids and vascular enhancements. Skull and upper cervical spine: No evident complication of pterional craniotomy. Sinuses/Orbits: Unremarkable IMPRESSION: Resected meningioma without complicating feature. Vasogenic edema and midline shift is unchanged. Electronically Signed   By: Monte Fantasia M.D.   On: 09/12/2020 07:23   MR BRAIN W WO CONTRAST  Result Date: 09/05/2020 CLINICAL DATA:  Encephalopathy.  Meningioma shown by CT. EXAM: MRI HEAD WITHOUT AND WITH CONTRAST TECHNIQUE: Multiplanar, multiecho pulse sequences of the brain and surrounding structures were obtained without and with intravenous contrast. CONTRAST:  6.51mL GADAVIST GADOBUTROL 1 MMOL/ML IV SOLN COMPARISON:  Head CT yesterday. FINDINGS: Brain: Diffusion imaging does not show any acute or subacute infarction. There is a large meningioma at the skull base on the right with the epicenter at the dorsal planum sphenoidale. This measures 3.3 cm front to back, 4.1 cm right to left and 2.4 cm cephalo caudal. Mass-effect upon the inferior right frontal lobe. Pronounced regional vasogenic edema affecting the right cerebral hemisphere. Mass effect with right-to-left shift of 2 cm. Question some degree of trapping of the left lateral ventricle. Mild background pattern of chronic small vessel change of the white matter seen otherwise. No hemorrhage. No extra-axial fluid  collection. Vascular: Major vessels at the base of the brain show flow. Skull and upper cervical spine: Negative Sinuses/Orbits: Clear/normal Other: None IMPRESSION: 3.3 x 4.1 x 2.4 cm meningioma at the skull base on the right with the epicenter at the dorsal planum sphenoidale. Mass-effect upon the inferior right frontal lobe. Pronounced regional vasogenic edema affecting the right cerebral hemisphere. Mass effect with right-to-left shift of 2 cm. Question some degree of trapping of the left lateral ventricle. No apparent change when compared to yesterday's CT. Electronically Signed   By: Nelson Chimes M.D.   On: 09/05/2020 15:56   CT CHEST ABDOMEN PELVIS W CONTRAST  Result Date: 09/05/2020 CLINICAL DATA:  Brain meningioma. EXAM: CT CHEST, ABDOMEN, AND PELVIS WITH CONTRAST TECHNIQUE: Multidetector CT imaging of the chest, abdomen and pelvis was performed following the standard protocol during bolus administration of intravenous contrast. CONTRAST:  58mL OMNIPAQUE IOHEXOL 350 MG/ML SOLN COMPARISON:  Multiple prior CT abdomen and pelvis, most recently from yesterday. FINDINGS: CT CHEST FINDINGS Cardiovascular: No significant vascular findings. Normal heart size. No pericardial effusion. No thoracic aortic aneurysm or dissection. No central pulmonary embolism. Mediastinum/Nodes: No enlarged mediastinal, hilar, or axillary lymph nodes. 1.6 cm heterogeneously hypodense right thyroid nodule. The trachea and esophagus demonstrate no significant findings. Lungs/Pleura: Minimal dependent subsegmental atelectasis in both lungs. No focal consolidation, pleural effusion, or pneumothorax. No suspicious pulmonary nodule. Musculoskeletal: No acute or significant osseous findings. CT ABDOMEN PELVIS FINDINGS Hepatobiliary: Unchanged scattered small hepatic cysts. Unchanged small gallstones. No gallbladder wall thickening or biliary dilatation. Pancreas: Unremarkable. No pancreatic ductal dilatation or surrounding inflammatory  changes. Spleen: Normal in size without focal abnormality. Adrenals/Urinary Tract: The adrenal glands are unremarkable. Unchanged simple cyst in the lower pole of the left kidney. Other subcentimeter low-density lesions in both kidneys are too small to characterize. Previously noted small bilateral renal calculi are obscured by excreted contrast. Unchanged 3 mm calculus in the  mid to distal right ureter. Excreted contrast within the bladder. Stomach/Bowel: Stomach is within normal limits. Appendix appears normal. No evidence of bowel wall thickening, distention, or inflammatory changes. Left-sided colonic diverticulosis. Vascular/Lymphatic: No significant vascular findings are present. No enlarged abdominal or pelvic lymph nodes. Reproductive: Tiny calcified uterine fibroid.  No adnexal mass. Other: Unchanged tiny fat containing umbilical hernia. No free fluid or pneumoperitoneum. Musculoskeletal: No acute or significant osseous findings. IMPRESSION: 1. No suspicious mass lesion or lymphadenopathy in the chest, abdomen, or pelvis. 2. Unchanged 3 mm calculus in the mid to distal right ureter. 3. Previously noted small bilateral renal calculi are obscured by excreted contrast. 4. 1.6 cm heterogeneously hypodense right thyroid nodule. Recommend thyroid US (ref: J Am Coll Radiol. 2015 Feb;12(2): 143-50). 5. Unchanged cholelithiasis. Electronically Signed   By: Titus Dubin M.D.   On: 09/05/2020 11:56   DG CHEST PORT 1 VIEW  Result Date: 09/04/2020 CLINICAL DATA:  Headache weakness EXAM: PORTABLE CHEST 1 VIEW COMPARISON:  None. FINDINGS: The heart size and mediastinal contours are within normal limits. Both lungs are clear. Partially visualized intramedullary rod in the left humerus. Mild diffuse bronchitic changes. IMPRESSION: Mild bronchitic changes without focal pneumonia. Electronically Signed   By: Donavan Foil M.D.   On: 09/04/2020 22:11   CT Renal Stone Study  Result Date: 09/04/2020 CLINICAL DATA:   Hematuria EXAM: CT ABDOMEN AND PELVIS WITHOUT CONTRAST TECHNIQUE: Multidetector CT imaging of the abdomen and pelvis was performed following the standard protocol without IV contrast. COMPARISON:  CT 01/27/2019 FINDINGS: Lower chest: Lung bases demonstrate mild dependent atelectasis. Normal cardiac size. Hepatobiliary: Multiple calcified gallstones. No biliary dilatation. Stable probable hepatic cyst in the left hepatic lobe Pancreas: Unremarkable. No pancreatic ductal dilatation or surrounding inflammatory changes. Spleen: Normal in size without focal abnormality. Adrenals/Urinary Tract: Adrenal glands are normal. Cyst off the lower pole left kidney. 3 mm stone in the midpole of the left kidney. 2 mm stone in the mid right kidney. Mild right renal pelvis dilatation without significant hydroureter, there is a 3 mm stone in the mid to distal right ureter at the level of the upper sacrum. The urinary bladder is unremarkable. Stomach/Bowel: The stomach is nonenlarged. No dilated small bowel. Negative appendix. Possible mild wall thickening of the distal descending/proximal sigmoid colon. Scattered left colon diverticula Vascular/Lymphatic: Nonaneurysmal aorta.  No suspicious nodes Reproductive: Probable small calcified uterine fibroid. No adnexal mass Other: Negative for free air or free fluid Musculoskeletal: No acute or significant osseous findings. IMPRESSION: 1. Mild right renal pelvis dilatation without significant hydroureter, secondary to a 3 mm stone in the mid to distal right ureter at the level of the upper sacrum. 2. Small intrarenal stones bilaterally. 3. Possible mild wall thickening of the distal descending/proximal sigmoid colon, could reflect a mild colitis of infectious, inflammatory, or ischemic etiology. 4. Gallstones. Electronically Signed   By: Donavan Foil M.D.   On: 09/04/2020 19:16    Labs:  Basic Metabolic Panel: Recent Labs  Lab 09/18/20 0454 09/22/20 0704  NA 137 139  K 4.1 3.9   CL 107 97*  CO2 23 29  GLUCOSE 98 115*  BUN 13 21  CREATININE 0.94 1.07*  CALCIUM 7.7* 9.2    CBC: Recent Labs  Lab 09/18/20 0454 09/22/20 0704  WBC 8.2 6.6  NEUTROABS 6.8 4.8  HGB 8.8* 9.9*  HCT 28.2* 33.2*  MCV 80.1 82.0  PLT 163 164    CBG: No results for input(s): GLUCAP in the last 168  hours.  Family history.  Mother with unknown source of cancer Father atrial fibrillation hyperlipidemia hypertension.  Sister with hyperlipidemia.  Negative esophageal cancer pancreatic cancer  Brief HPI:   [name] Page is a 70 y.o. right-handed female with history of iron deficiency anemia CKD stage III renal stones basal cell versus squamous cell carcinoma the chest and low back.  Patient lives alone mobile home multiple steps to entry.  She has multiple family members who check on her routinely.  Reportedly independent prior to admission.  Presented 09/04/2020 with progressive bilateral lower extremity weakness as well as headaches x2 months.  Family also reports episodes of forgetfulness and confusion.  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  There was a leftward midline shift with subfalcine herniation of the right cingulate gyrus with early entrapment of the left lateral ventricle.  CT renal study completed for some hematuria that showed mild renal pelvis dilation without significant hydroureter secondary to 3 mm stone at the distal right ureter at the level of the upper sacrum avoid urology services Dr. Caprice Beaver was consulted recommended course of antibiotics which is since been completed.  Patient underwent right craniotomy for resection of anterior skull base mass on 09/11/2020 per Dr. Duffy Rhody.  Decadron protocol as indicated.  Keppra for seizure prophylaxis.  Subcutaneous Lovenox for DVT prophylaxis.  Physical medicine consulted in regards to assess patient's candidacy for CIR with noted impaired mobility  and ADLs and was admitted for a comprehensive rehab program.   Hospital Course: [name] Page was admitted to rehab 09/17/2020 for inpatient therapies to consist of PT, ST and OT at least three hours five days a week. Past admission physiatrist, therapy team and rehab RN have worked together to provide customized collaborative inpatient rehab.  Pertaining to patient's decreased functional mobility secondary to lower extremity weakness secondary to anterior skull base brain mass had undergone craniotomy resection of anterior skull base mass 09/11/2020 and would follow-up with Dr. Duffy Rhody.  Noted also history of basal cell versus squamous cell carcinoma of the chest and low back which was to be followed outpatient as well as findings of suspicion of bloody lesion behind the ear further evaluation pending giving her cancer history to be discussed with dermatology services.  She continued on Keppra for seizure prophylaxis no seizure activity blood pressure control with hydralazine titrated as well as low-dose propranolol.  History of renal stones follow-up outpatient urology services.  Lipitor ongoing for hyperlipidemia.  Iron deficiency anemia no bleeding episodes continue iron supplement.  Generalized tremors doing well with propranolol.  She had a mild elevated ALT her Tylenol was discontinued would need follow-up outpatient hepatic panels.  She had some peripheral edema initially placed on Bumex however blood pressure were soft and was discontinued.  Compression hose were in place.  She remained on subcutaneous Lovenox for DVT prophylaxis during her hospital course mood stabilization with Lexapro she was attending full therapies.   Blood pressures were monitored on TID basis and soft and monitored     Rehab course: During patient's stay in rehab weekly team conferences were held to monitor patient's progress, set goals and discuss barriers to discharge. At admission, patient required minimal  assist 40 feet rolling walker minimal assist sit to stand minimal assist supine to sit.  Minimal guard upper body bathing max is lower body bathing minimal assist upper body dressing mod assist lower body dressing  Physical exam.  Blood  pressure 145/75 pulse 59 temperature 98 respirations 16 oxygen saturation 90% room air Constitutional.  No acute distress HEENT.  Craniotomy site clean and dry Eyes.  Pupils round and reactive to light no discharge.nystagmus Neck.  Supple nontender no JVD without thyromegaly Cardiac regular rate rhythm without extra sounds or murmur heard Abdomen.  Soft nontender positive bowel sounds without rebound Respiratory effort normal no respiratory distress without wheeze Skin.  Staples intact craniotomy site noted multiple lesions to chest back and behind her ear Neurologic.  Alert makes eye contact with examiner provides name and age with some delay in processing follows commands Motor.  Right upper extremity 4+/5 proximal distal Right lower extremity 4/5 proximal distal left upper extremity shoulder abduction 2+/5 baseline, distally 4/5 Left lower extremity 4/5 proximal distal  He/She  has had improvement in activity tolerance, balance, postural control as well as ability to compensate for deficits. He/She has had improvement in functional use RUE/LUE  and RLE/LLE as well as improvement in awareness.  Patient with good overall progress ambulating 240 feet x 2 rest breaks as needed needs some cues for upward gaze.  Stand step transfer to wheelchair with supervision.  Performs toe taps on cones with alternating legs.  Requires minimal assist for safety at times she is able to complete max of 20 reps contact-guard assist.  9 to sitting edge of bed contact-guard ambulates rolling walker to the bathroom complete sponge bath wheelchair level at sink with set up minimal assist to don her shirt.  Speech therapy facilitated sessions with recall task to focused on medications.   Patient overall modified independent for recall of her current medications and functions.  Full family teaching completed plan discharge to home       Disposition: Discharged home    Diet: Mechanical soft  Special Instructions: No driving smoking or alcohol  Medications at discharge 1.  Lipitor 20 mg daily 2.  Os-Cal 1 tablet daily 3.  Decadron 1 mg tablet daily x2 days and stop 4.  Colace 100 mg p.o. twice daily 5.  Lexapro 10 mg p.o. nightly 6.  Ferrous sulfate 325 mg p.o. daily 7.  Hydralazine 50 mg p.o. every 8 hours 8.  Hydrocodone 1 tablet every 4 hours as needed moderate pain 9.  Keppra 500 mg p.o. twice daily 10.  Liquid tears 1 drop both eyes twice daily as needed 11.  Protonix 40 mg p.o. daily 12.  Inderal 10 mg p.o. 3 times daily  30-35 minutes were spent completing discharge summary and discharge planning  Discharge Instructions    Ambulatory referral to Physical Medicine Rehab   Complete by: As directed    Moderate complexity follow-up 2 weeks meningioma       Follow-up Information    Raulkar, Clide Deutscher, MD Follow up.   Specialty: Physical Medicine and Rehabilitation Why: Office to call for appointment Contact information: [id number] [place] [address] [phone]        Vallarie Mare, MD Follow up.   Specialty: Neurosurgery Why: Call for appointment Contact information: [place] [address] [phone]        Alexis Frock, MD Follow up.   Specialty: Urology Why: Call for appointment as needed Contact information: [place] [address] [phone]        Macario Carls, MD Follow up.   Specialty: Specialist Why: Follow-up 09/30/2020 12:15 PM for evaluation of lesion behind ear Contact information: [place]  [address] [place] [address] [phone]               Signed: Mcarthur Rossetti Santosh Petter 09/24/2020, 6:01 AM

## 2020-09-23 NOTE — Progress Notes (Signed)
Speech Language Pathology Daily Session Note  Patient Details  Name: [name] Page MRN: [id number] Date of Birth: [birth year]  Today's Date: 09/23/2020 SLP Individual Time: 1330-1410 SLP Individual Time Calculation (min): 40 min  Short Term Goals: Week 1: SLP Short Term Goal 1 (Week 1): STGs=LTGs due to ELOS  Skilled Therapeutic Interventions: Skilled treatment session focused on cognitive goals. SLP facilitated session by providing extra time and overall supervision level verbal cues for complex problem solving during a medication management task. Patient was also overall Mod I for recall of events from previous therapy sessions. Patient left upright in bed with alarm on and all needs within reach. Continue with current plan of care.      Pain No/Denies Pain   Therapy/Group: Individual Therapy  [name] 09/23/2020, 3:11 PM

## 2020-09-23 NOTE — Progress Notes (Signed)
Physical Therapy Session Note  Patient Details  Name: [name] Page MRN: [id number] Date of Birth: 12-25-1950  Today's Date: 09/23/2020 PT Individual Time: 0902-0944 PT Individual Time Calculation (min): 42 min   Short Term Goals: Week 1:  PT Short Term Goal 1 (Week 1): STG=LTG due to ELOS  Skilled Therapeutic Interventions/Progress Updates:     Pt received supine in bed and agrees to therapy. No complaint of pain. Supine to sit mod(I) with bed features. PT dons TED hose and pt able to don shoes at EOB. Sit to stand with supervision and cues for body mechanics. Pt ambulates 150' without AD and with cues for upright gaze, increased gait speed to decrease risk for falls, and increased arm swing for improved balance. Pt performs 2x5 reps sit to stand with arms folded across chest for increased strengthening. Pt attempts to perform 3rd set and only able to complete x4 reps due to fatigue. Pt ambulates x180' with supervision and no AD. Pt left seated at EOB with alarm intact and all needs within reach.   Therapy Documentation Precautions:  Precautions Precautions: Fall Restrictions Weight Bearing Restrictions: No   Therapy/Group: Individual Therapy  Beau Fanny, PT, DPT 09/23/2020, 9:13 AM

## 2020-09-23 NOTE — Progress Notes (Signed)
Occupational Therapy Session Note  Patient Details  Name: [name] Page MRN: [id number] Date of Birth: 21-Jan-1951  Today's Date: 09/23/2020  Session 1 OT Individual Time: 1000-1030 OT Individual Time Calculation (min): 30 min   Session 2 OT Individual Time: 4497-5300 OT Individual Time Calculation (min): 73 min    Short Term Goals: Week 1:  OT Short Term Goal 1 (Week 1): LTG=STG 2/2 ELOS  Skilled Therapeutic Interventions/Progress Updates:    Session 1 Pt greeted semi-reclined in bed and agreeable to OT treatment session. Stand-pivot to wc with supervision. Pt brought to dayroom in wc for time management. Worked on standing balance/endurance with Wii bowling activity. Had pt step with LLE and follow through with R UE when swinging Wii remote. Close supervision with intermittent CGA when stepping through. Pt tolerated standing for 6 bowling frames. Pt returned to room and completed stand-pivot back to bed with supervision. Pt left semi-reclined in bed with needs met.   Session 2 Pt greeted semi-reclined in bed and agreeable to OT treatment session. Pt ambulated into bathroom without AD and close supervision. Pt voided bladder and had successful medium sized BM. Peri-care and clothing management with supervision. Bathing completed sit<>stand from wc at the sink with overall set-up A and close supervision for balance when standing to wash buttocks. OT provided pt with friction reducing bag and educated on technique to don TED hose for edema management. Pt demonstrated ability to don and doff TED hose. Pt brought down to therapy gym and completed 5 mins x 2 on UE Ergometer for UB strengthening. Worked on L fine motor control with BITS system by tracing in the lines with stylus. Pt returned to room and requested to return to bed. Supervision stand-pivot back to bed. Pt left semi-reclined in bed with bed alarm on, call bell in reach, and needs met.   Therapy Documentation Precautions:   Precautions Precautions: Fall Restrictions Weight Bearing Restrictions: No Pain:  denies pain  Therapy/Group: Individual Therapy  Valma Cava 09/23/2020, 3:27 PM

## 2020-09-24 ENCOUNTER — Inpatient Hospital Stay (HOSPITAL_COMMUNITY): Payer: Medicare Other | Admitting: Speech Pathology

## 2020-09-24 ENCOUNTER — Other Ambulatory Visit: Payer: Self-pay | Admitting: Family Medicine

## 2020-09-24 ENCOUNTER — Inpatient Hospital Stay (HOSPITAL_COMMUNITY): Payer: Medicare Other

## 2020-09-24 ENCOUNTER — Inpatient Hospital Stay (HOSPITAL_COMMUNITY): Payer: Medicare Other | Admitting: Occupational Therapy

## 2020-09-24 DIAGNOSIS — M810 Age-related osteoporosis without current pathological fracture: Secondary | ICD-10-CM

## 2020-09-24 LAB — CREATININE, SERUM
Creatinine, Ser: 1.12 mg/dL — ABNORMAL HIGH (ref 0.44–1.00)
GFR, Estimated: 53 mL/min — ABNORMAL LOW (ref >60)

## 2020-09-24 MED ORDER — ESCITALOPRAM OXALATE 10 MG PO TABS: 10.0000 mg | ORAL_TABLET | Freq: Every day | ORAL | 1 refills | Status: DC

## 2020-09-24 MED ORDER — HYDROCODONE-ACETAMINOPHEN 5-325 MG PO TABS: 1.0000 | ORAL_TABLET | ORAL | 0 refills | Status: DC | PRN

## 2020-09-24 MED ORDER — HYDRALAZINE HCL 50 MG PO TABS: 50.0000 mg | ORAL_TABLET | Freq: Three times a day (TID) | ORAL | 0 refills | Status: DC

## 2020-09-24 MED ORDER — ATORVASTATIN CALCIUM 20 MG PO TABS: 20.0000 mg | ORAL_TABLET | Freq: Every day | ORAL | 0 refills | Status: DC

## 2020-09-24 MED ORDER — PANTOPRAZOLE SODIUM 40 MG PO TBEC: 40.0000 mg | DELAYED_RELEASE_TABLET | Freq: Every day | ORAL | 0 refills | Status: DC

## 2020-09-24 MED ORDER — PROPRANOLOL HCL 10 MG PO TABS: 10.0000 mg | ORAL_TABLET | Freq: Three times a day (TID) | ORAL | 0 refills | Status: DC

## 2020-09-24 MED ORDER — DEXAMETHASONE 1 MG PO TABS: 1.0000 mg | ORAL_TABLET | Freq: Every day | ORAL | 0 refills | Status: DC

## 2020-09-24 MED ORDER — FERROUS SULFATE 325 (65 FE) MG PO TABS: 325.0000 mg | ORAL_TABLET | Freq: Every day | ORAL | 3 refills | Status: DC

## 2020-09-24 MED ORDER — DOCUSATE SODIUM 50 MG/5ML PO LIQD: 100.0000 mg | Freq: Two times a day (BID) | ORAL | 0 refills | Status: DC

## 2020-09-24 MED ORDER — LEVETIRACETAM 100 MG/ML PO SOLN: 500.0000 mg | Freq: Two times a day (BID) | ORAL | 12 refills | Status: DC

## 2020-09-24 NOTE — Progress Notes (Signed)
Occupational Therapy Session Note  Patient Details  Name: [name] Page MRN: [id number] Date of Birth: [birth year]  Today's Date: 09/24/2020 OT Individual Time: 2767-0110 OT Individual Time Calculation (min): 60 min    Short Term Goals: Week 1:  OT Short Term Goal 1 (Week 1): LTG=STG 2/2 ELOS    Skilled Therapeutic Interventions/Progress Updates:    Pt seen this session for ADL training with a focus on endurance and balance. Pt able to get out of bed, use her RW to ambulate to gather clothing and supplies for her bath. Ambulated in and out of bathroom to toilet.  Pt able to bathe self at sink and dress with S.  Pt moved at a fair pace.  Pt is still not able to shower but can transfer in and out tub/shower with S.   She completed grooming in standing. Pt is feeling ready to go home tomorrow. She has a good amount of family support and will not be alone.  Pt resting in bed with all needs met and alarm set.   Therapy Documentation Precautions:  Precautions Precautions: Fall Restrictions Weight Bearing Restrictions: No  Pain: Pain Assessment Pain Score: 0-No pain ADL: ADL Eating: Independent Grooming: Independent Upper Body Bathing: Independent Lower Body Bathing: Supervision/safety Upper Body Dressing: Supervision/safety Lower Body Dressing: Supervision/safety Toileting: Supervision/safety Toilet Transfer Method: Ambulating Tub/Shower Transfer: S  Therapy/Group: Individual Therapy  Amberlie Gaillard 09/24/2020, 12:28 PM

## 2020-09-24 NOTE — Progress Notes (Signed)
Pt had no additional drainage from lesion behind right ear last night.

## 2020-09-24 NOTE — Progress Notes (Signed)
Physical Therapy Discharge Summary  Patient Details  Name: [name] Page MRN: [id number] Date of Birth: 12-Jun-1951  Today's Date: 09/24/2020 PT Individual Time: 0802-0900 PT Individual Time Calculation (min): 58 min    Patient has met 9 of 9 long term goals due to improved balance, improved postural control and increased strength.  Patient to discharge at an ambulatory level at modified independent with RW and only requiring cues to improve efficiency of transfers and ambulation, but pt safe with all mobility at mod(I).  Reasons goals not met: NA  Recommendation:  Patient will benefit from ongoing skilled PT services in outpatient setting to continue to advance safe functional mobility, address ongoing impairments in strength, balance, endurance, and minimize fall risk.  Equipment: RW  Reasons for discharge: treatment goals met and discharge from hospital  Patient/family agrees with progress made and goals achieved: Yes   Skilled Therapeutic Intervention: Pt received supine in bed and agrees to therapy., No complaint of pain. Bed mobility independent. Pt performs sit to stand form EOB with verbal cues on hand placement and body mechanics. Pt ambulates 400' with RW and verbal cues to increase proximity to RW for increased safety. Pt completes car transfer and ramp navigation with cues for sequencing. Seated rest break prior to additional ambulation. Pt ambulates 140' with RW and cues for increased proximity to RW. Pt completes Berg Balance Assessment as detailed below. Pt completes x12 steps with BHRs and supervision with cues on step sequencing. Pt ambulates back to room, x200', and left seated on EOB with all needs within reach. PT provides pt with HEP addressing lower body strength and balance deficts. Left with al needs within reach.  PT Discharge Precautions/Restrictions Precautions Precautions: Fall Restrictions Weight Bearing Restrictions: No Vision/Perception   Perception Perception: Within Functional Limits Praxis Praxis: Intact  Cognition Overall Cognitive Status: Within Functional Limits for tasks assessed Arousal/Alertness: Awake/alert Orientation Level: Oriented X4 Safety/Judgment: Appears intact Sensation Sensation Light Touch: Appears Intact Coordination Gross Motor Movements are Fluid and Coordinated: Yes Motor  Motor Motor: Other (comment) Motor - Skilled Clinical Observations: resting tremor BLE/BUE L>R.  Mobility Bed Mobility Bed Mobility: Sit to Supine;Supine to Sit Rolling Right: Independent Rolling Left: Independent Supine to Sit: Independent Sit to Supine: Independent Transfers Transfers: Sit to Stand;Stand Pivot Transfers;Stand to Sit Sit to Stand: Supervision/Verbal cueing Stand to Sit: Supervision/Verbal cueing Stand Pivot Transfers: Supervision/Verbal cueing Stand Pivot Transfer Details: Verbal cues for sequencing Transfer (Assistive device): None Locomotion  Gait Ambulation: Yes Gait Assistance: Supervision/Verbal cueing Gait Distance (Feet): 400 Feet Assistive device: Rolling walker Gait Assistance Details: Verbal cues for technique;Verbal cues for gait pattern Gait Gait: Yes Gait Pattern: Within Functional Limits Gait velocity: decreased Stairs / Additional Locomotion Stairs: Yes Stairs Assistance: Supervision/Verbal cueing Stair Management Technique: One rail Right Number of Stairs: 12 Height of Stairs: 6 Ramp: Supervision/Verbal cueing Curb: Supervision/Verbal cueing Wheelchair Mobility Wheelchair Mobility: No  Trunk/Postural Assessment  Cervical Assessment Cervical Assessment: Within Functional Limits Thoracic Assessment Thoracic Assessment: Within Functional Limits Lumbar Assessment Lumbar Assessment: Within Functional Limits Postural Control Postural Control: Within Functional Limits  Balance Balance Balance Assessed: Yes Standardized Balance Assessment Standardized Balance  Assessment: Berg Balance Test Berg Balance Test Sit to Stand: Able to stand without using hands and stabilize independently Standing Unsupported: Able to stand safely 2 minutes Sitting with Back Unsupported but Feet Supported on Floor or Stool: Able to sit safely and securely 2 minutes Stand to Sit: Sits safely with minimal use of hands Transfers: Able to transfer  safely, minor use of hands Standing Unsupported with Eyes Closed: Able to stand 10 seconds safely Standing Ubsupported with Feet Together: Able to place feet together independently and stand 1 minute safely From Standing, Reach Forward with Outstretched Arm: Can reach confidently >25 cm (10") From Standing Position, Pick up Object from Floor: Able to pick up shoe, needs supervision From Standing Position, Turn to Look Behind Over each Shoulder: Looks behind from both sides and weight shifts well Turn 360 Degrees: Able to turn 360 degrees safely but slowly Standing Unsupported, Alternately Place Feet on Step/Stool: Able to stand independently and complete 8 steps >20 seconds Standing Unsupported, One Foot in Front: Able to plae foot ahead of the other independently and hold 30 seconds Standing on One Leg: Tries to lift leg/unable to hold 3 seconds but remains standing independently Total Score: 48 Static Sitting Balance Static Sitting - Balance Support: Feet supported Static Sitting - Level of Assistance: 7: Independent Dynamic Sitting Balance Dynamic Sitting - Balance Support: During functional activity Dynamic Sitting - Level of Assistance: 7: Independent Static Standing Balance Static Standing - Balance Support: During functional activity Static Standing - Level of Assistance: 5: Stand by assistance Dynamic Standing Balance Dynamic Standing - Balance Support: During functional activity Dynamic Standing - Level of Assistance: 5: Stand by assistance Extremity Assessment      RLE Assessment RLE Assessment: Within Functional  Limits General Strength Comments: grossly 4+/5 to 5/5 proximal LLE Assessment LLE Assessment: Within Functional Limits General Strength Comments: grossly 4+/5 to 5/5 proximal    Breck Coons 09/24/2020, 8:39 AM

## 2020-09-24 NOTE — Progress Notes (Signed)
[place] PHYSICAL MEDICINE & REHABILITATION PROGRESS NOTE   Subjective/Complaints: No complaints this morning Basal cell carcinoma is no longer bleeding.  She denies headache About to work with PT  ROS: Patient denies fever, rash, sore throat, blurred vision, nausea, vomiting, diarrhea, cough, shortness of breath or chest pain, joint or back pain, headache, or mood change.    Objective:   No results found. Recent Labs    09/22/20 0704  WBC 6.6  HGB 9.9*  HCT 33.2*  PLT 164   Recent Labs    09/22/20 0704 09/24/20 0520  NA 139  --   K 3.9  --   CL 97*  --   CO2 29  --   GLUCOSE 115*  --   BUN 21  --   CREATININE 1.07* 1.12*  CALCIUM 9.2  --     Intake/Output Summary (Last 24 hours) at 09/24/2020 1157 Last data filed at 09/24/2020 0700 Gross per 24 hour  Intake 420 ml  Output --  Net 420 ml        Physical Exam: Vital Signs Blood pressure (!) 138/59, pulse (!) 59, temperature 98.1 F (36.7 C), resp. rate 16, height 5\' 2"  (1.575 m), weight 72.8 kg, SpO2 95 %.  Gen: no distress, normal appearing HEENT: oral mucosa pink and moist, NCAT Cardio: Bradyacardia Chest: normal effort, normal rate of breathing Abd: soft, non-distended GI/Abdomen: BS +, non-tender, non-distended Ext: no clubbing, cyanosis, or edema Psych: pleasant and cooperative Skin: prominent right crani incision with staples. S/s drainage from behind right ear from silver dollar sized raised lesion--no longer bleeding. Left chest wound Musc: Right > Left lower extremity edema is minimal No tenderness in extremities. Neuro: Alert and oriented x 3. Normal insight and awareness. Intact Memory. Normal language and speech. Cranial nerve exam unremarkable  Motor: RUE: 4 to 4+/5 proximal distal RLE: 4-/5 proximal distally, stable LUE: Shoulder abduction 2+/5 (near baseline), distally 4/5 Left lower extremity: 4-/5 proximal distal, stable Generalized tremors not evident today  Assessment/Plan: 1.  Functional deficits which require 3+ hours per day of interdisciplinary therapy in a comprehensive inpatient rehab setting.  Physiatrist is providing close team supervision and 24 hour management of active medical problems listed below.  Physiatrist and rehab team continue to assess barriers to discharge/monitor patient progress toward functional and medical goals  Care Tool:  Bathing    Body parts bathed by patient: Right arm,Left arm,Chest,Abdomen,Front perineal area,Buttocks,Left upper leg,Right upper leg,Right lower leg,Left lower leg,Face         Bathing assist Assist Level: Supervision/Verbal cueing     Upper Body Dressing/Undressing Upper body dressing   What is the patient wearing?: Pull over shirt    Upper body assist Assist Level: Supervision/Verbal cueing    Lower Body Dressing/Undressing Lower body dressing      What is the patient wearing?: Underwear/pull up,Pants     Lower body assist Assist for lower body dressing: Supervision/Verbal cueing     Toileting Toileting    Toileting assist Assist for toileting: Supervision/Verbal cueing     Transfers Chair/bed transfer  Transfers assist     Chair/bed transfer assist level: Supervision/Verbal cueing     Locomotion Ambulation   Ambulation assist      Assist level: Supervision/Verbal cueing Assistive device: No Device Max distance: 150'   Walk 10 feet activity   Assist     Assist level: Supervision/Verbal cueing Assistive device: No Device   Walk 50 feet activity   Assist    Assist  level: Supervision/Verbal cueing Assistive device: No Device    Walk 150 feet activity   Assist Walk 150 feet activity did not occur: Safety/medical concerns  Assist level: Contact Guard/Touching assist Assistive device: No Device    Walk 10 feet on uneven surface  activity   Assist Walk 10 feet on uneven surfaces activity did not occur: Safety/medical concerns          Wheelchair     Assist Will patient use wheelchair at discharge?: No (Per PT long term goals) Type of Wheelchair: Manual    Wheelchair assist level: Supervision/Verbal cueing Max wheelchair distance: 76ft    Wheelchair 50 feet with 2 turns activity    Assist        Assist Level: Supervision/Verbal cueing   Wheelchair 150 feet activity     Assist      Assist Level: Moderate Assistance - Patient 50 - 74%   Blood pressure (!) 138/59, pulse (!) 59, temperature 98.1 F (36.7 C), resp. rate 16, height 5\' 2"  (1.575 m), weight 72.8 kg, SpO2 95 %.     Medical Problem List and Plan: 1.  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  Follow-up Dr. 09/13/2020.  Decadron protocol as indicated  Continue CIR  -ELOS 1/6 2.  Antithrombotics: -DVT/anticoagulation: Lovenox.              -antiplatelet therapy: N/A 3. Pain Management: Hydrocodone as needed  Controlled with meds on 1/5- continue current regimen.              Monitor, particularly for headaches with increased exertion 4. Mood: Lexapro 10 mg daily- change to nightly as she takes at home             -antipsychotic agents: N/A 5. Neuropsych: This patient is capable of making decisions on her own behalf. 6. Skin/Wound Care: Routine skin checks.   Large right facial lesion, left chest lesion rather concerning.   Spoke to Dr. Hoyt Koch (dermatology) today who will see her in office next week on 09/30/20 at 12:15   -silver nitrate to bleeding resulted in excellent cauterization, no longer bleeding 7. Fluids/Electrolytes/Nutrition: Routine in and outs 8.  Seizure prophylaxis.  Keppra 500 mg twice daily 9.  Hypertension.    Hydralazine 10 mg every 8 hours increased to 50mg   Propanolol started on 1/1 - HR in 60's 10.  History of renal stones.  Follow-up outpatient urology  services 11.  Hyperlipidemia: Lipitor 12.  Constipation.  Colace 100 mg twice daily, MiraLAX as needed 13.  Iron deficiency anemia.  Continue iron supplement             Hemoglobin 8.8 on 12/30--> 9.9 1/3 14.  Generalized tremors - longstanding.   Propanolol 10mg  started on 1/1 with some apparent  Improvement   -continue to monitor 15. ALT elevated: tylenol d/ced. Discussed with patient.   Down to 42 1/3 16. Suspicious bloody lesion behind ear- will get further evaluation given cancer history. Has f/u with dermatology scheduled as above.  17.  Hypoalbuminemia  Supplement initiated on 1/1 18.  Peripheral edema  Bumex started on 1/1--appears to have resolved 1/3    Continue to Hold bumex 19. AKI: Cr uptrending. Placed nursing order to encourage hydration and ordered repeat for tomorrow.   LOS: 7 days A FACE TO FACE EVALUATION WAS PERFORMED  11/28/20 Julliette Frentz 09/24/2020, 11:57 AM

## 2020-09-24 NOTE — Progress Notes (Signed)
Speech Language Pathology Discharge Summary  Patient Details  Name: [name] Page MRN: [id number] Date of Birth: 02/05/1951  Today's Date: 09/24/2020 SLP Individual Time: 1305-1400 SLP Individual Time Calculation (min): 55 min   Skilled Therapeutic Interventions:  Skilled treatment session focused on cognitive goals. SLP facilitated session by providing education regarding patient's current cognitive functioning and strategies to utilize at home to maximize recall and overall safety. Patient also educated on the importance of staying both physically and cognitive active at home. She verbalized understanding of all information. Patient left upright in bed with alarm on and all needs within reach.   Patient has met 2 of 2 long term goals.  Patient to discharge at overall Modified Independent level.   Reasons goals not met: N/A   Clinical Impression/Discharge Summary: Patient has made excellent gains and has met 2 of 2 LTGs this admission. Currently, patient is overall Mod I to complete functional and mildly complex tasks safely in regards to problem solving and recall. Education is complete and patient will discharge home with intermittent assistance from family. Patient is at her baseline level of cognitive functioning, therefore, skilled SLP f/u is not warranted at this time. Patient verbalized understanding and agreement of all recommendations.   Care Partner:  Caregiver Able to Provide Assistance: Yes     Recommendation:  None      Equipment: N/A   Reasons for discharge: Treatment goals met;Discharged from hospital   Patient/Family Agrees with Progress Made and Goals Achieved: Yes    [place], [place] 09/24/2020, 6:35 AM

## 2020-09-24 NOTE — Progress Notes (Signed)
Family not in today as of yet for education. Will see if family able to come for wound education.

## 2020-09-25 DIAGNOSIS — C4441 Basal cell carcinoma of skin of scalp and neck: Secondary | ICD-10-CM

## 2020-09-25 LAB — BASIC METABOLIC PANEL
Anion gap: 10 (ref 5–15)
BUN: 22 mg/dL (ref 8–23)
CO2: 29 mmol/L (ref 22–32)
Calcium: 8.6 mg/dL — ABNORMAL LOW (ref 8.9–10.3)
Chloride: 99 mmol/L (ref 98–111)
Creatinine, Ser: 1.13 mg/dL — ABNORMAL HIGH (ref 0.44–1.00)
GFR, Estimated: 52 mL/min — ABNORMAL LOW (ref >60)
Glucose, Bld: 99 mg/dL (ref 70–99)
Potassium: 4.4 mmol/L (ref 3.5–5.1)
Sodium: 138 mmol/L (ref 135–145)

## 2020-09-25 NOTE — Progress Notes (Signed)
Patient ID: [name] Page, female   DOB: 03-Mar-1951, 70 y.o.   MRN: [id number] Discharged to home accompanied by daughter. Belongings packed up and patient taken down via wheelchair. Discharge instructions given to patient and daughter per Justice Deeds, PAC. No questions or concerns noted. Pamelia Hoit

## 2020-09-25 NOTE — Discharge Instructions (Signed)
Inpatient Rehab Discharge Instructions  JAHDAI PADOVANO Discharge date and time: No discharge date for patient encounter.   Activities/Precautions/ Functional Status: Activity: activity as tolerated Diet: Soft Wound Care: Routine skin checks Functional status:  ___ No restrictions     ___ Walk up steps independently ___ 24/7 supervision/assistance   ___ Walk up steps with assistance ___ Intermittent supervision/assistance  ___ Bathe/dress independently ___ Walk with walker     _x__ Bathe/dress with assistance ___ Walk Independently    ___ Shower independently ___ Walk with assistance    ___ Shower with assistance ___ No alcohol     ___ Return to work/school ________ COMMUNITY REFERRALS UPON DISCHARGE:    Outpatient: PT            Agency:[facility]   Phone: [phone].             Appointment Date/Time: Facility to Determine  Home Health:   PT     OT                     Agency: [facility]  Phone: [phone]   Medical Equipment/Items Ordered: Levan Hurst, [place]                                                 Agency/Supplier: [employer]  Special Instructions: Patient provided with OP and HH option in case patient changes mind on OP. Patient has contact information to [facility].  No driving smoking or alcohol   My questions have been answered and I understand these instructions. I will adhere to these goals and the provided educational materials after my discharge from the hospital.  Patient/Caregiver Signature _______________________________ Date __________  Clinician Signature _______________________________________ Date __________  Please bring this form and your medication list with you to all your follow-up doctor's appointments.

## 2020-09-25 NOTE — Progress Notes (Addendum)
[place] PHYSICAL MEDICINE & REHABILITATION PROGRESS NOTE   Subjective/Complaints: In good spirits. Excited to be going home. Minimal blood from lesion behind ear.   ROS: Patient denies fever, rash, sore throat, blurred vision, nausea, vomiting, diarrhea, cough, shortness of breath or chest pain, joint or back pain, or mood change.     Objective:   No results found. No results for input(s): WBC, HGB, HCT, PLT in the last 72 hours. Recent Labs    09/24/20 0520 09/25/20 0449  NA  --  138  K  --  4.4  CL  --  99  CO2  --  29  GLUCOSE  --  99  BUN  --  22  CREATININE 1.12* 1.13*  CALCIUM  --  8.6*    Intake/Output Summary (Last 24 hours) at 09/25/2020 0930 Last data filed at 09/25/2020 0700 Gross per 24 hour  Intake 720 ml  Output --  Net 720 ml        Physical Exam: Vital Signs Blood pressure 138/71, pulse 63, temperature 98.2 F (36.8 C), temperature source Oral, resp. rate 18, height 5\' 2"  (1.575 m), weight 72.8 kg, SpO2 97 %.  Constitutional: No distress . Vital signs reviewed. HEENT: EOMI, oral membranes moist Neck: supple Cardiovascular: RRR without murmur. No JVD    Respiratory/Chest: CTA Bilaterally without wheezes or rales. Normal effort    GI/Abdomen: BS +, non-tender, non-distended Ext: no clubbing, cyanosis, or edema Psych: pleasant and cooperative Skin: prominent right crani incision with staples--CDI. Right ear lesion dry with black/cauterized tissue from silver nitrate. Raised lesion on chest/back stable. Musc: Right > Left lower extremity edema is minimal at this point No tenderness in extremities. Neuro: Alert and oriented x 3. Normal insight and awareness. Intact Memory. Normal language and speech. Cranial nerve exam unremarkable   Motor: RUE: 4 to 4+/5 proximal distal RLE: 4-/5 proximal distally, stable LUE: Shoulder abduction 2+/5 (near baseline), distally 4/5 Left lower extremity: 4-/5 proximal distal, stable No tremors  Assessment/Plan: 1.  Functional deficits which require 3+ hours per day of interdisciplinary therapy in a comprehensive inpatient rehab setting.  Physiatrist is providing close team supervision and 24 hour management of active medical problems listed below.  Physiatrist and rehab team continue to assess barriers to discharge/monitor patient progress toward functional and medical goals  Care Tool:  Bathing    Body parts bathed by patient: Right arm,Left arm,Chest,Abdomen,Front perineal area,Buttocks,Left upper leg,Right upper leg,Right lower leg,Left lower leg,Face         Bathing assist Assist Level: Supervision/Verbal cueing     Upper Body Dressing/Undressing Upper body dressing   What is the patient wearing?: Pull over shirt    Upper body assist Assist Level: Supervision/Verbal cueing    Lower Body Dressing/Undressing Lower body dressing      What is the patient wearing?: Underwear/pull up,Pants     Lower body assist Assist for lower body dressing: Supervision/Verbal cueing     Toileting Toileting    Toileting assist Assist for toileting: Supervision/Verbal cueing     Transfers Chair/bed transfer  Transfers assist     Chair/bed transfer assist level: Independent with assistive device Chair/bed transfer assistive device:   Ambulation assist      Assist level: Independent with assistive device Assistive device: Walker-rolling Max distance: 400'   Walk 10 feet activity   Assist     Assist level: Independent with assistive device Assistive device: Walker-rolling   Walk 50 feet activity  Assist    Assist level: Independent with assistive device Assistive device: Walker-rolling    Walk 150 feet activity   Assist Walk 150 feet activity did not occur: Safety/medical concerns  Assist level: Independent with assistive device Assistive device: Walker-rolling    Walk 10 feet on uneven surface  activity   Assist Walk 10 feet  on uneven surfaces activity did not occur: Safety/medical concerns   Assist level: Supervision/Verbal cueing     Wheelchair     Assist Will patient use wheelchair at discharge?: No Type of Wheelchair: Manual    Wheelchair assist level: Supervision/Verbal cueing Max wheelchair distance: 23ft    Wheelchair 50 feet with 2 turns activity    Assist        Assist Level: Supervision/Verbal cueing   Wheelchair 150 feet activity     Assist      Assist Level: Moderate Assistance - Patient 50 - 74%   Blood pressure 138/71, pulse 63, temperature 98.2 F (36.8 C), temperature source Oral, resp. rate 18, height 5\' 2"  (1.575 m), weight 72.8 kg, SpO2 97 %.     Medical Problem List and Plan: 1.  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  Follow-up Dr. Duffy Rhody.  Decadron protocol as indicated  Continue CIR  -DC home today  -Patient to see MD in the office for transitional care encounter in 1-2 weeks.  2.  Antithrombotics: -DVT/anticoagulation: Lovenox.              -antiplatelet therapy: N/A 3. Pain Management: Hydrocodone as needed  Controlled with meds on 1/6- continue current regimen at discharge               4. Mood: Lexapro 10 mg daily- change to nightly as she takes at home             -antipsychotic agents: N/A 5. Neuropsych: This patient is capable of making decisions on her own behalf. 6. Skin/Wound Care: Routine skin checks.   Multiple raised lesions on chest/back/rigt peri-aural   Spoke to Dr. Pearline Cables (dermatology) today who will see her in office next week on 09/30/20 at 12:15 at [facility] office   -silver nitrate to bleeding resulted in excellent cauterization, minimal bleeding  -remove crani staples today, 14+ days 7. Fluids/Electrolytes/Nutrition: Routine in and outs 8.  Seizure prophylaxis.   Keppra 500 mg twice daily 9.  Hypertension.    Hydralazine 10 mg every 8 hours increased to 50mg   Propanolol started on 1/1 - HR in 60's 10.  History of renal stones.  Follow-up outpatient urology services 11.  Hyperlipidemia: Lipitor 12.  Constipation.  Colace 100 mg twice daily, MiraLAX as needed 13.  Iron deficiency anemia.  Continue iron supplement             Hemoglobin 8.8 on 12/30--> 9.9 1/3 14.  Generalized tremors - longstanding.   Propanolol 10mg  started on 1/1 with some apparent  Improvement   -continue to monitor 15. ALT elevated: tylenol d/ced. Discussed with patient.   Down to 43 1/3 59.  Hypoalbuminemia  Supplement initiated on 1/1 18.  Peripheral edema  Bumex started on 1/1--appears to have resolved 1/3    Continue to Hold bumex 19. AKI: Cr sl elevated. Cr stable 1/6, bumex stopped as above  -encourage appropriate fluids LOS: 8 days A FACE TO FACE EVALUATION WAS PERFORMED  Meredith Staggers 09/25/2020, 9:30  AM    

## 2020-09-25 NOTE — Progress Notes (Signed)
Inpatient Rehabilitation Care Coordinator Discharge Note  The overall goal for the admission was met for:   Discharge location: Yes, home  Length of Stay: Yes  Discharge activity level: Yes, modified independent with RW   Home/community participation: Yes  Services provided included: MD, RD, PT, OT, SLP, RN, CM, TR, Pharmacy, Neuropsych and SW  Financial Services: Medicare  Choices offered to/list presented VI:[id number]   Follow-up services arranged: Other: OP: [facility] [place]: [facility]   Comments (or additional information): PT OT RW, BSC if patient cjanges mind on OP she has contact for [facility], orders sent  Patient/Family verbalized understanding of follow-up arrangements: Yes  Individual responsible for coordination of the follow-up plan: patient [phone]  Confirmed correct DME delivered: Dyanne Iha 09/25/2020    Dyanne Iha

## 2020-09-30 DIAGNOSIS — L304 Erythema intertrigo: Secondary | ICD-10-CM | POA: Diagnosis not present

## 2020-09-30 DIAGNOSIS — L905 Scar conditions and fibrosis of skin: Secondary | ICD-10-CM | POA: Diagnosis not present

## 2020-09-30 DIAGNOSIS — D485 Neoplasm of uncertain behavior of skin: Secondary | ICD-10-CM | POA: Diagnosis not present

## 2020-09-30 DIAGNOSIS — Z85828 Personal history of other malignant neoplasm of skin: Secondary | ICD-10-CM | POA: Diagnosis not present

## 2020-10-01 ENCOUNTER — Ambulatory Visit: Payer: Medicare Other | Attending: Physician Assistant | Admitting: Physical Therapy

## 2020-10-01 ENCOUNTER — Other Ambulatory Visit: Payer: Self-pay

## 2020-10-01 ENCOUNTER — Encounter: Payer: Self-pay | Admitting: Physical Therapy

## 2020-10-01 DIAGNOSIS — M6281 Muscle weakness (generalized): Secondary | ICD-10-CM | POA: Diagnosis not present

## 2020-10-01 DIAGNOSIS — R2681 Unsteadiness on feet: Secondary | ICD-10-CM | POA: Insufficient documentation

## 2020-10-01 NOTE — Therapy (Signed)
[facility] [place], [place], [address] Phone: [phone]   Fax:  [phone]  Physical Therapy Evaluation  Patient Details  Name: [name] Page MRN: [id number] Date of Birth: 08/18/1951 Referring Provider (PT): Lauraine Rinne, PA-C   Encounter Date: 10/01/2020   PT End of Session - 10/01/20 1434    Visit Number 1    Number of Visits 12    Date for PT Re-Evaluation 11/19/20    Authorization Type Medicare (CQ and KX modifier) Progress note every 10th visit    PT Start Time 1115    PT Stop Time 1200    PT Time Calculation (min) 45 min    Equipment Utilized During Treatment Other (comment)   rolling walker   Activity Tolerance Patient tolerated treatment well    Behavior During Therapy [facility] for tasks assessed/performed           Past Medical History:  Diagnosis Date  . Arthritis   . Basal cell carcinoma   . 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 Cancer ([place])    melanoma  . Dysuria   . Frequency of urination   . Hematuria   . History of kidney stones   . Osteoporosis   . Renal calculus, bilateral   . 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 Urgency of urination   . Wears glasses     Past Surgical History:  Procedure Laterality Date  . APPLICATION OF CRANIAL NAVIGATION Right 09/11/2020   Procedure: APPLICATION OF CRANIAL NAVIGATION;  Surgeon: Vallarie Mare, MD;  Location: [facility];  Service: Neurosurgery;  Laterality: Right;  . CRANIOTOMY Right 09/11/2020   Procedure: Right Pterional Craniotomy for resection of anterior skull base mass with Brain Lab;  Surgeon: Vallarie Mare, MD;  Location: [facility];  Service: Neurosurgery;  Laterality: Right;  . CYSTOSCOPY WITH RETROGRADE PYELOGRAM, URETEROSCOPY AND STENT PLACEMENT Bilateral 08/11/2013   Procedure: CYSTOSCOPY WITH RETROGRADE PYELOGRAM, URETEROSCOPY AND STENT PLACEMENT;  Surgeon: Alexis Frock, MD;  Location: [facility];   Service: Urology;  Laterality: Bilateral;  . CYSTOSCOPY WITH URETEROSCOPY AND STENT PLACEMENT Bilateral 08/29/2013   Procedure: CYSTOSCOPY WITH BILATERAL URETEROSCOPY AND STENT REPLACEMENTS, stone extraction;  Surgeon: Alexis Frock, MD;  Location: [facility];  Service: Urology;  Laterality: Bilateral;  . CYSTOSCOPY/RETROGRADE/URETEROSCOPY/STONE EXTRACTION WITH BASKET Bilateral 01/31/2016   Procedure: CYSTOSCOPY/BILATERAL RETROGRADE PYELOGRAMS/BILATERAL URETEROSCOPY/BILATERAL STONE EXTRACTION WITH BASKET/RIGHT URETERAL STENT PLACEMENT;  Surgeon: Raynelle Bring, MD;  Location: [facility];  Service: Urology;  Laterality: Bilateral;  . HEMIARTHROPLASTY SHOULDER FRACTURE Left 1992  . HOLMIUM LASER APPLICATION Bilateral [id number]   Procedure: HOLMIUM LASER APPLICATION;  Surgeon: Alexis Frock, MD;  Location: [facility];  Service: Urology;  Laterality: Bilateral;    There were no vitals filed for this visit.    Subjective Assessment - 10/01/20 1236    Subjective COVID-19 screening performed upon arrival. Patient arrives to physical therapy with report of LE weakness, left weaker than right, and difficulty walking secondary to craniotomy to remove brain mass on 09/11/2020. Patient ambulates with rolling walker for community distances and intermittently walks without an AD in her home. Patient is independent with home activities and ADLs as she lives alone but states she has her family who check on her frequently. Patient is consistent with performing HEP provided PT from inpatient therapy. Patient's goals are to improve movement, improve strength, and walk without an AD.  Patient is accompained by: Family member   Sister   Pertinent History craniotomy 09/11/2020, HTN, osteoporosis, history of shoulder surgery    Limitations House hold activities    Patient Stated Goals improve ability to do normal activities and walk without walker    Currently in Pain? No/denies               [facility] PT Assessment - 10/01/20 0001      Assessment   Medical Diagnosis Meningioma    Referring Provider (PT) Mariam Dollar, PA-C    Onset Date/Surgical Date --   09/11/2020   Next MD Visit 10/06/2020    Prior Therapy yes      Precautions   Precautions Fall      Restrictions   Weight Bearing Restrictions No      Balance Screen   Has the patient fallen in the past 6 months Yes    How many times? 3    Has the patient had a decrease in activity level because of a fear of falling?  Yes    Is the patient reluctant to leave their home because of a fear of falling?  Yes      Home Environment   Living Environment Private residence    Living Arrangements Alone      Prior Function   Level of Independence Independent with basic ADLs      ROM / Strength   AROM / PROM / Strength Strength      Strength   Strength Assessment Site Hip;Knee    Right/Left Hip Right;Left    Right Hip Flexion 3+/5    Right Hip ABduction 3/5    Left Hip Flexion 3+/5    Left Hip ABduction 3/5    Right/Left Knee Right;Left    Right Knee Flexion 4-/5    Right Knee Extension 4/5    Left Knee Flexion 4-/5    Left Knee Extension 4/5      Transfers   Comments slow transfers with definite use of hands      Ambulation/Gait   Assistive device Rolling walker    Gait Pattern Decreased stride length;Shuffle;Poor foot clearance - left;Poor foot clearance - right;Trunk flexed      Standardized Balance Assessment   Standardized Balance Assessment Berg Balance Test      Berg Balance Test   Sit to Stand Able to stand  independently using hands    Standing Unsupported Able to stand safely 2 minutes    Sitting with Back Unsupported but Feet Supported on Floor or Stool Able to sit safely and securely 2 minutes    Stand to Sit Controls descent by using hands    Transfers Able to transfer safely, definite need of hands    Standing Unsupported with Eyes Closed Able to stand 10 seconds with supervision     Standing Unsupported with Feet Together Able to place feet together independently and stand for 1 minute with supervision    From Standing, Reach Forward with Outstretched Arm Can reach confidently >25 cm (10")    From Standing Position, Pick up Object from Floor Able to pick up shoe, needs supervision    From Standing Position, Turn to Look Behind Over each Shoulder Looks behind from both sides and weight shifts well    Turn 360 Degrees Able to turn 360 degrees safely one side only in 4 seconds or less    Standing Unsupported, Alternately Place Feet on Step/Stool Needs assistance to keep from falling or unable to  try    Standing Unsupported, One Foot in Front Needs help to step but can hold 15 seconds    Standing on One Leg Tries to lift leg/unable to hold 3 seconds but remains standing independently    Total Score 39                      Objective measurements completed on examination: See above findings.               PT Education - 10/01/20 1432    Education Details verbal cuing for proper gait mechanics, continue HEP provided by PT from hospital slowly increase reps as tolerable.    Person(s) Educated Patient;Other (comment)   sister   Methods Explanation    Comprehension Verbalized understanding               PT Long Term Goals - 10/01/20 1446      PT LONG TERM GOAL #1   Title Patient will be independent with HEP and its progression    Time 6    Period Weeks    Status New      PT LONG TERM GOAL #2   Title Patient will demonstrate 4/5  bilateral LE MMT to improve stability during functional tasks.    Time 6    Period Weeks    Status New      PT LONG TERM GOAL #3   Title Patient will demonstrate a 4+ point improvement on Berg Balance Scale to decrease risk of falls.    Time 6    Period Weeks    Status New      PT LONG TERM GOAL #4   Title Patient will perform modifed 5x sit to stand test in 18 seconds or less with no UE support to improve  balance and improve functional LE strength.    Time 6    Period Weeks    Status New                  Plan - 10/01/20 1435    Clinical Impression Statement Patient is a 70 year old female who presents to physical therapy with her sister with bilateral LE weakness, decreased balance, and difficulty walking secondary to a craniotomy on 09/11/2020. Patient's modified 5x sit to stand time of 23.9 seconds categorizes her as a fall risk with decreased bilateral functional strength. Patient's Berg Balance Scale Score of 39/56 categorizes her as a moderate fall risk. Patient ambulates with a rolling walker with decreased stride lengths, decreased heel strike bilaterally and flexed trunk. Patient and PT discussed plan of care and discussed continuing HEP and how to progress HEP provided by inpatient PT. Patient reported understanding. Patient would benefit from skilled physical therapy to address deficits and address patient's goals.    Personal Factors and Comorbidities Comorbidity 3+    Comorbidities craniotomy 09/11/2020, HTN, osteoporosis, history of shoulder surgery    Examination-Activity Limitations Locomotion Level    Stability/Clinical Decision Making Stable/Uncomplicated    Clinical Decision Making Low    Rehab Potential Good    PT Frequency 2x / week    PT Duration 6 weeks    PT Treatment/Interventions ADLs/Self Care Home Management;Gait training;Stair training;Functional mobility training;Therapeutic activities;Therapeutic exercise;Balance training;Neuromuscular re-education;Manual techniques;Passive range of motion;Patient/family education    PT Next Visit Plan nustep, LE strengthening, gait training, balance training, core stability.    PT Home Exercise Plan cont. HEP provided by inpatient PT    Consulted and Agree  with Plan of Care Patient           Patient will benefit from skilled therapeutic intervention in order to improve the following deficits and impairments:  Abnormal  gait,Difficulty walking,Pain,Decreased balance,Decreased activity tolerance  Visit Diagnosis: Muscle weakness (generalized) - Plan: PT plan of care cert/re-cert  Unsteadiness on feet - Plan: PT plan of care cert/re-cert     Problem List Patient Active Problem List   Diagnosis Date Noted  . Peripheral edema   . Hypoalbuminemia due to protein-calorie malnutrition ([place])   . Transaminitis   . Tremor   . Mass of brain   . Seizure prophylaxis   . Iron deficiency anemia   . CKD (chronic kidney disease), stage III ([place]) 09/10/2020  . AMS (altered mental status) 09/04/2020  . Vasogenic Cerebral edema ([place]) 09/04/2020  . Meningioma ([place]) 09/04/2020  . Generalized weakness 09/04/2020  . Cancer of skin, squamous cell 09/04/2020  . Nephrolithiasis 09/04/2020  . UTI (urinary tract infection) 09/04/2020  . Essential hypertension 11/01/2015  . Osteoporosis 10/30/2015    Gabriela Eves, PT, DPT 10/01/2020, 6:54 PM  [facility] [address] [place], [place], [address] Phone: [phone]   Fax:  [phone]  Name: [name] Page MRN: [id number] Date of Birth: 1951-05-27

## 2020-10-03 ENCOUNTER — Encounter: Payer: Self-pay | Admitting: Physical Therapy

## 2020-10-03 ENCOUNTER — Ambulatory Visit: Payer: Medicare Other | Admitting: Physical Therapy

## 2020-10-03 ENCOUNTER — Other Ambulatory Visit: Payer: Self-pay

## 2020-10-03 DIAGNOSIS — R2681 Unsteadiness on feet: Secondary | ICD-10-CM

## 2020-10-03 DIAGNOSIS — M6281 Muscle weakness (generalized): Secondary | ICD-10-CM | POA: Diagnosis not present

## 2020-10-03 NOTE — Therapy (Signed)
[facility] [place], [place], [address] Phone: [phone]   Fax:  [phone]  Physical Therapy Treatment  Patient Details  Name: [name] Page MRN: [id number] Date of Birth: [birth year] Referring Provider (PT): Lauraine Rinne, PA-C   Encounter Date: 10/03/2020   PT End of Session - 10/03/20 1142    Visit Number 2    Number of Visits 12    Authorization Type Medicare (CQ and KX modifier) Progress note every 10th visit    PT Start Time 0945    PT Stop Time 1031    PT Time Calculation (min) 46 min    Equipment Utilized During Treatment Other (comment)   rolling walker   Activity Tolerance Patient tolerated treatment well    Behavior During Therapy [facility] for tasks assessed/performed           Past Medical History:  Diagnosis Date  . Arthritis   . Basal cell carcinoma   . 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 Cancer ([place])    melanoma  . Dysuria   . Frequency of urination   . Hematuria   . History of kidney stones   . Osteoporosis   . Renal calculus, bilateral   . 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 Urgency of urination   . Wears glasses     Past Surgical History:  Procedure Laterality Date  . APPLICATION OF CRANIAL NAVIGATION Right 09/11/2020   Procedure: APPLICATION OF CRANIAL NAVIGATION;  Surgeon: Vallarie Mare, MD;  Location: [place];  Service: Neurosurgery;  Laterality: Right;  . CRANIOTOMY Right 09/11/2020   Procedure: Right Pterional Craniotomy for resection of anterior skull base mass with Brain Lab;  Surgeon: Vallarie Mare, MD;  Location: [place];  Service: Neurosurgery;  Laterality: Right;  . CYSTOSCOPY WITH RETROGRADE PYELOGRAM, URETEROSCOPY AND STENT PLACEMENT Bilateral 08/11/2013   Procedure: CYSTOSCOPY WITH RETROGRADE PYELOGRAM, URETEROSCOPY AND STENT PLACEMENT;  Surgeon: Alexis Frock, MD;  [facility];  Service: Urology;  Laterality:  Bilateral;  . CYSTOSCOPY WITH URETEROSCOPY AND STENT PLACEMENT Bilateral 08/29/2013   Procedure: CYSTOSCOPY WITH BILATERAL URETEROSCOPY AND STENT REPLACEMENTS, stone extraction;  Surgeon: Alexis Frock, MD;  Location: [facility];  Service: Urology;  Laterality: Bilateral;  . CYSTOSCOPY/RETROGRADE/URETEROSCOPY/STONE EXTRACTION WITH BASKET Bilateral 01/31/2016   Procedure: CYSTOSCOPY/BILATERAL RETROGRADE PYELOGRAMS/BILATERAL URETEROSCOPY/BILATERAL STONE EXTRACTION WITH BASKET/RIGHT URETERAL STENT PLACEMENT;  Surgeon: Raynelle Bring, MD;  [facility];  Service: Urology;  Laterality: Bilateral;  . HEMIARTHROPLASTY SHOULDER FRACTURE Left 1992  . HOLMIUM LASER APPLICATION Bilateral [id number]   Procedure: HOLMIUM LASER APPLICATION;  Surgeon: Alexis Frock, MD;  Location[facility];  Service: Urology;  Laterality: Bilateral;    There were no vitals filed for this visit.   Subjective Assessment - 10/03/20 0959    Subjective COVID-19 screening performed upon arrival. Patient arrives feeling a little sore and tired but overall doing well.    Patient is accompained by: Family member    Pertinent History craniotomy 09/11/2020, HTN, osteoporosis, history of shoulder surgery    Limitations House hold activities              [facility] PT Assessment - 10/03/20 0001      Assessment   Medical Diagnosis Meningioma    Referring Provider (PT) Lauraine Rinne, PA-C    Next MD Visit 10/06/2020    Prior Therapy yes      Precautions   Precautions  Fall                         [facility] Adult PT Treatment/Exercise - 10/03/20 0001      Exercises   Exercises Knee/Hip      Knee/Hip Exercises: Stretches   Gastroc Stretch Both;2 reps;30 seconds      Knee/Hip Exercises: Aerobic   Nustep level 3 x10 mins      Knee/Hip Exercises: Seated   Clamshell with TheraBand Yellow   x20   Other Seated Knee/Hip Exercises core push down with red physioball x20 followed by  oblique push down x20 bilaterally    Marching Strengthening;Both;20 reps    Hamstring Curl Strengthening;Both;20 reps    Hamstring Limitations yellow theraband      Knee/Hip Exercises: Supine   Short Arc Quad Sets AROM;Both;2 sets;10 reps    Short Arc Quad Sets Limitations 3" hold    Other Supine Knee/Hip Exercises supine marching x20                       PT Long Term Goals - 10/01/20 1446      PT LONG TERM GOAL #1   Title Patient will be independent with HEP and its progression    Time 6    Period Weeks    Status New      PT LONG TERM GOAL #2   Title Patient will demonstrate 4/5  bilateral LE MMT to improve stability during functional tasks.    Time 6    Period Weeks    Status New      PT LONG TERM GOAL #3   Title Patient will demonstrate a 4+ point improvement on Berg Balance Scale to decrease risk of falls.    Time 6    Period Weeks    Status New      PT LONG TERM GOAL #4   Title Patient will perform modifed 5x sit to stand test in 18 seconds or less with no UE support to improve balance and improve functional LE strength.    Time 6    Period Weeks    Status New                 Plan - 10/03/20 1143    Clinical Impression Statement Patient arrives to physical therapy with reports of fatigue but overall doing well. Patient guided through TEs with verbal cuing for pacing and technique. Patient demonstrated strong carryover after reps. Patient provided with intermittent rest breaks secondary to fatigue. Patient educated she may be sore after session or even into the next day but it is a normal response. Patient reported understanding.    Personal Factors and Comorbidities Comorbidity 3+    Comorbidities craniotomy 09/11/2020, HTN, osteoporosis, history of shoulder surgery    Examination-Activity Limitations Locomotion Level    Stability/Clinical Decision Making Stable/Uncomplicated    Clinical Decision Making Low    Rehab Potential Good    PT  Frequency 2x / week    PT Duration 6 weeks    PT Treatment/Interventions ADLs/Self Care Home Management;Gait training;Stair training;Functional mobility training;Therapeutic activities;Therapeutic exercise;Balance training;Neuromuscular re-education;Manual techniques;Passive range of motion;Patient/family education    PT Next Visit Plan nustep, LE strengthening, gait training, balance training, core stability.    PT Home Exercise Plan cont. HEP provided by inpatient PT    Consulted and Agree with Plan of Care Patient           Patient will benefit  from skilled therapeutic intervention in order to improve the following deficits and impairments:  Abnormal gait,Difficulty walking,Pain,Decreased balance,Decreased activity tolerance  Visit Diagnosis: Muscle weakness (generalized)  Unsteadiness on feet     Problem List Patient Active Problem List   Diagnosis Date Noted  . Peripheral edema   . Hypoalbuminemia due to protein-calorie malnutrition ([place])   . Transaminitis   . Tremor   . Mass of brain   . Seizure prophylaxis   . Iron deficiency anemia   . CKD (chronic kidney disease), stage III ([place]) 09/10/2020  . AMS (altered mental status) 09/04/2020  . Vasogenic Cerebral edema ([place]) 09/04/2020  . Meningioma ([place]) 09/04/2020  . Generalized weakness 09/04/2020  . Cancer of skin, squamous cell 09/04/2020  . Nephrolithiasis 09/04/2020  . UTI (urinary tract infection) 09/04/2020  . Essential hypertension 11/01/2015  . Osteoporosis 10/30/2015    [name] Page, PT, DPT 10/03/2020, 11:48 AM  [facility] [address] [place], [place], [address] Phone: [phone]   Fax:  [phone]  Name: [name] Page MRN: [id number] Date of Birth: [birth year]

## 2020-10-07 ENCOUNTER — Ambulatory Visit: Payer: Medicare Other | Admitting: Physical Therapy

## 2020-10-09 ENCOUNTER — Encounter: Payer: Medicare Other | Admitting: Registered Nurse

## 2020-10-09 ENCOUNTER — Ambulatory Visit: Payer: Medicare Other | Admitting: Physical Therapy

## 2020-10-09 ENCOUNTER — Encounter: Payer: Self-pay | Admitting: Physical Therapy

## 2020-10-09 ENCOUNTER — Other Ambulatory Visit: Payer: Self-pay

## 2020-10-09 DIAGNOSIS — M6281 Muscle weakness (generalized): Secondary | ICD-10-CM | POA: Diagnosis not present

## 2020-10-09 DIAGNOSIS — R2681 Unsteadiness on feet: Secondary | ICD-10-CM

## 2020-10-09 NOTE — Therapy (Signed)
[facility] [place], [place], [address] Phone: [phone]   Fax:  [phone]  Physical Therapy Treatment  Patient Details  Name: [name] Page MRN: [id number] Date of Birth: Feb 16, 1951 Referring Provider (PT): Lauraine Rinne, PA-C   Encounter Date: 10/09/2020   PT End of Session - 10/09/20 1522    Visit Number 3    Number of Visits 12    Date for PT Re-Evaluation 11/19/20    Authorization Type Medicare (CQ and KX modifier) Progress note every 10th visit    PT Start Time 1518    PT Stop Time 1551    PT Time Calculation (min) 33 min    Activity Tolerance Patient limited by fatigue    Behavior During Therapy [facility] for tasks assessed/performed           Past Medical History:  Diagnosis Date  . Arthritis   . Basal cell carcinoma   . 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 Cancer ([place])    melanoma  . Dysuria   . Frequency of urination   . Hematuria   . History of kidney stones   . Osteoporosis   . Renal calculus, bilateral   . 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 Urgency of urination   . Wears glasses     Past Surgical History:  Procedure Laterality Date  . APPLICATION OF CRANIAL NAVIGATION Right 09/11/2020   Procedure: APPLICATION OF CRANIAL NAVIGATION;  Surgeon: Vallarie Mare, MD;  Location: [place];  Service: Neurosurgery;  Laterality: Right;  . CRANIOTOMY Right 09/11/2020   Procedure: Right Pterional Craniotomy for resection of anterior skull base mass with Brain Lab;  Surgeon: Vallarie Mare, MD;  Location: [place];  Service: Neurosurgery;  Laterality: Right;  . CYSTOSCOPY WITH RETROGRADE PYELOGRAM, URETEROSCOPY AND STENT PLACEMENT Bilateral 08/11/2013   Procedure: CYSTOSCOPY WITH RETROGRADE PYELOGRAM, URETEROSCOPY AND STENT PLACEMENT;  Surgeon: Alexis Frock, MD;  [facility];  Service: Urology;  Laterality: Bilateral;  . CYSTOSCOPY WITH URETEROSCOPY  AND STENT PLACEMENT Bilateral 08/29/2013   Procedure: CYSTOSCOPY WITH BILATERAL URETEROSCOPY AND STENT REPLACEMENTS, stone extraction;  Surgeon: Alexis Frock, MD;  Location: [facility];  Service: Urology;  Laterality: Bilateral;  . CYSTOSCOPY/RETROGRADE/URETEROSCOPY/STONE EXTRACTION WITH BASKET Bilateral 01/31/2016   Procedure: CYSTOSCOPY/BILATERAL RETROGRADE PYELOGRAMS/BILATERAL URETEROSCOPY/BILATERAL STONE EXTRACTION WITH BASKET/RIGHT URETERAL STENT PLACEMENT;  Surgeon: Raynelle Bring, MD;  [facility];  Service: Urology;  Laterality: Bilateral;  . HEMIARTHROPLASTY SHOULDER FRACTURE Left 1992  . HOLMIUM LASER APPLICATION Bilateral 62/26/3335   Procedure: HOLMIUM LASER APPLICATION;  Surgeon: Alexis Frock, MD;  Location[facility];  Service: Urology;  Laterality: Bilateral;    There were no vitals filed for this visit.   Subjective Assessment - 10/09/20 1520    Subjective COVID-19 screening performed upon arrival. Patient arrives without AD and denies pain.    Patient is accompained by: Family member    Pertinent History craniotomy 09/11/2020, HTN, osteoporosis, history of shoulder surgery    Limitations House hold activities    Patient Stated Goals improve ability to do normal activities and walk without walker    Currently in Pain? No/denies              [facility] PT Assessment - 10/09/20 0001      Assessment   Medical Diagnosis Meningioma    Referring Provider (PT) Lauraine Rinne, PA-C    Next MD Visit 10/10/2020  Prior Therapy yes      Precautions   Precautions Fall      Restrictions   Weight Bearing Restrictions No                         [facility] Adult PT Treatment/Exercise - 10/09/20 0001      Knee/Hip Exercises: Aerobic   Nustep level 3 x10 mins      Knee/Hip Exercises: Standing   Heel Raises Both;20 reps    Heel Raises Limitations B toe raise x20 reps    Hip Abduction Stengthening;Both;15 reps;Knee straight       Knee/Hip Exercises: Seated   Long Arc Quad Strengthening;Both;20 reps;Weights    Long Arc Quad Weight 3 lbs.    Clamshell with TheraBand Red   x20 reps   Other Seated Knee/Hip Exercises core push down with red physioball x10 reps 5 sec holds    Hamstring Curl Strengthening;Both;20 reps    Hamstring Limitations red theraband    Sit to Sand 15 reps;without UE support                       PT Long Term Goals - 10/09/20 1610      PT LONG TERM GOAL #1   Title Patient will be independent with HEP and its progression    Time 6    Period Weeks    Status Partially Met      PT LONG TERM GOAL #2   Title Patient will demonstrate 4/5  bilateral LE MMT to improve stability during functional tasks.    Time 6    Period Weeks    Status On-going      PT LONG TERM GOAL #3   Title Patient will demonstrate a 4+ point improvement on Berg Balance Scale to decrease risk of falls.    Time 6    Period Weeks    Status On-going      PT LONG TERM GOAL #4   Title Patient will perform modifed 5x sit to stand test in 18 seconds or less with no UE support to improve balance and improve functional LE strength.    Time 6    Period Weeks    Status On-going                 Plan - 10/09/20 1555    Clinical Impression Statement Patient presented in clinic in good spirits as she was able to ambulate without AD. Patient's sister was close for supervision for all gait. Patient able to tolerate therex fairly well with resistance although she fatigued fairly quickly. Patient denies any limitations within her home enviroment as she has a ramp to get into and out of her house. Patient compliant with HEP as well and has close family for support.    Personal Factors and Comorbidities Comorbidity 3+    Comorbidities craniotomy 09/11/2020, HTN, osteoporosis, history of shoulder surgery    Examination-Activity Limitations Locomotion Level    Stability/Clinical Decision Making Stable/Uncomplicated     Rehab Potential Good    PT Frequency 2x / week    PT Duration 6 weeks    PT Treatment/Interventions ADLs/Self Care Home Management;Gait training;Stair training;Functional mobility training;Therapeutic activities;Therapeutic exercise;Balance training;Neuromuscular re-education;Manual techniques;Passive range of motion;Patient/family education    PT Next Visit Plan nustep, LE strengthening, gait training, balance training, core stability.    PT Home Exercise Plan cont. HEP provided by inpatient PT    Consulted and Agree with  Plan of Care Patient           Patient will benefit from skilled therapeutic intervention in order to improve the following deficits and impairments:  Abnormal gait,Difficulty walking,Pain,Decreased balance,Decreased activity tolerance  Visit Diagnosis: Muscle weakness (generalized)  Unsteadiness on feet     Problem List Patient Active Problem List   Diagnosis Date Noted  . Peripheral edema   . Hypoalbuminemia due to protein-calorie malnutrition ([place])   . Transaminitis   . Tremor   . Mass of brain   . Seizure prophylaxis   . Iron deficiency anemia   . CKD (chronic kidney disease), stage III ([place]) 09/10/2020  . AMS (altered mental status) 09/04/2020  . Vasogenic Cerebral edema ([place]) 09/04/2020  . Meningioma ([place]) 09/04/2020  . Generalized weakness 09/04/2020  . Cancer of skin, squamous cell 09/04/2020  . Nephrolithiasis 09/04/2020  . UTI (urinary tract infection) 09/04/2020  . Essential hypertension 11/01/2015  . Osteoporosis 10/30/2015    Standley Brooking, PTA 10/09/2020, 4:18 PM  [facility] [place], [place], [address] Phone: [phone]   Fax:  [phone]  Name: FAIGE SEELY MRN: [id number] Date of Birth: 08-05-1951

## 2020-10-10 ENCOUNTER — Encounter: Payer: Medicare Other | Admitting: Physical Therapy

## 2020-10-14 ENCOUNTER — Other Ambulatory Visit: Payer: Self-pay

## 2020-10-14 ENCOUNTER — Ambulatory Visit: Payer: Medicare Other | Admitting: Physical Therapy

## 2020-10-14 ENCOUNTER — Encounter: Payer: Self-pay | Admitting: Physical Therapy

## 2020-10-14 DIAGNOSIS — M6281 Muscle weakness (generalized): Secondary | ICD-10-CM

## 2020-10-14 DIAGNOSIS — R2681 Unsteadiness on feet: Secondary | ICD-10-CM | POA: Diagnosis not present

## 2020-10-14 NOTE — Therapy (Signed)
[facility] [place], [place], [address] Phone: [phone]   Fax:  [phone]  Physical Therapy Treatment  Patient Details  Name: [name] Page MRN: [id number] Date of Birth: June 13, 1951 Referring Provider (PT): Lauraine Rinne, PA-C   Encounter Date: 10/14/2020   PT End of Session - 10/14/20 1432    Visit Number 4    Number of Visits 12    Date for PT Re-Evaluation 11/19/20    Authorization Type Medicare (CQ and KX modifier) Progress note every 10th visit    PT Start Time 1430    PT Stop Time 1511    PT Time Calculation (min) 41 min    Equipment Utilized During Treatment Other (comment)    Activity Tolerance Patient limited by fatigue;Patient tolerated treatment well    Behavior During Therapy [facility] for tasks assessed/performed           Past Medical History:  Diagnosis Date  . Arthritis   . Basal cell carcinoma   . 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 Cancer ([place])    melanoma  . Dysuria   . Frequency of urination   . Hematuria   . History of kidney stones   . Osteoporosis   . Renal calculus, bilateral   . 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 Urgency of urination   . Wears glasses     Past Surgical History:  Procedure Laterality Date  . APPLICATION OF CRANIAL NAVIGATION Right 09/11/2020   Procedure: APPLICATION OF CRANIAL NAVIGATION;  Surgeon: Vallarie Mare, MD;  Location: [place];  Service: Neurosurgery;  Laterality: Right;  . CRANIOTOMY Right 09/11/2020   Procedure: Right Pterional Craniotomy for resection of anterior skull base mass with Brain Lab;  Surgeon: Vallarie Mare, MD;  Location: [place];  Service: Neurosurgery;  Laterality: Right;  . CYSTOSCOPY WITH RETROGRADE PYELOGRAM, URETEROSCOPY AND STENT PLACEMENT Bilateral 08/11/2013   Procedure: CYSTOSCOPY WITH RETROGRADE PYELOGRAM, URETEROSCOPY AND STENT PLACEMENT;  Surgeon: Alexis Frock, MD;   [facility];  Service: Urology;  Laterality: Bilateral;  . CYSTOSCOPY WITH URETEROSCOPY AND STENT PLACEMENT Bilateral 08/29/2013   Procedure: CYSTOSCOPY WITH BILATERAL URETEROSCOPY AND STENT REPLACEMENTS, stone extraction;  Surgeon: Alexis Frock, MD;  Location: [facility];  Service: Urology;  Laterality: Bilateral;  . CYSTOSCOPY/RETROGRADE/URETEROSCOPY/STONE EXTRACTION WITH BASKET Bilateral 01/31/2016   Procedure: CYSTOSCOPY/BILATERAL RETROGRADE PYELOGRAMS/BILATERAL URETEROSCOPY/BILATERAL STONE EXTRACTION WITH BASKET/RIGHT URETERAL STENT PLACEMENT;  Surgeon: Raynelle Bring, MD;  [facility];  Service: Urology;  Laterality: Bilateral;  . HEMIARTHROPLASTY SHOULDER FRACTURE Left 1992  . HOLMIUM LASER APPLICATION Bilateral 87/86/7672   Procedure: HOLMIUM LASER APPLICATION;  Surgeon: Alexis Frock, MD;  Location: [facility];  Service: Urology;  Laterality: Bilateral;    There were no vitals filed for this visit.   Subjective Assessment - 10/14/20 1521    Subjective COVID-19 screening performed upon arrival. Patient arrives doing well but with some soreness after last session.    Patient is accompained by: Family member    Pertinent History craniotomy 09/11/2020, HTN, osteoporosis, history of shoulder surgery    Limitations House hold activities    Patient Stated Goals improve ability to do normal activities and walk without walker    Currently in Pain? No/denies              [facility] PT Assessment - 10/14/20 0001      Assessment   Medical Diagnosis Meningioma  Referring Provider (PT) Lauraine Rinne, PA-C    Next MD Visit 11/2020    Prior Therapy yes      Precautions   Precautions Fall      Restrictions   Weight Bearing Restrictions No                         [facility] Adult PT Treatment/Exercise - 10/14/20 0001      Exercises   Exercises Knee/Hip;Lumbar      Lumbar Exercises: Supine   Ab Set 10 reps;3 seconds       Knee/Hip Exercises: Aerobic   Nustep level 3 x12 mins      Knee/Hip Exercises: Standing   Heel Raises Both;20 reps    Heel Raises Limitations B toe raise x20 reps    Hip Flexion AROM;Both;2 sets;10 reps      Knee/Hip Exercises: Seated   Clamshell with TheraBand Red   2x10   Marching Strengthening;Both;2 sets;10 reps    Hamstring Curl Strengthening;Both;20 reps    Hamstring Limitations red theraband      Knee/Hip Exercises: Supine   Short Arc Quad Sets AROM;Both;2 sets;10 reps    Short Arc Quad Sets Limitations 3" 3#    Bridges AROM;Both;1 set;10 reps    Straight Leg Raises AROM;Both;1 set;10 reps                       PT Long Term Goals - 10/09/20 1610      PT LONG TERM GOAL #1   Title Patient will be independent with HEP and its progression    Time 6    Period Weeks    Status Partially Met      PT LONG TERM GOAL #2   Title Patient will demonstrate 4/5  bilateral LE MMT to improve stability during functional tasks.    Time 6    Period Weeks    Status On-going      PT LONG TERM GOAL #3   Title Patient will demonstrate a 4+ point improvement on Berg Balance Scale to decrease risk of falls.    Time 6    Period Weeks    Status On-going      PT LONG TERM GOAL #4   Title Patient will perform modifed 5x sit to stand test in 18 seconds or less with no UE support to improve balance and improve functional LE strength.    Time 6    Period Weeks    Status On-going                 Plan - 10/14/20 1511    Clinical Impression Statement Patient arrives doing well but with some soreness after last session. Nustep time increased to 12 mins for muscular endurance with good response. Patient guided through more supine activities but with fatigue. Patient provided with therapeutic rest breaks as needed. Patient with strong carryover of cuing for remaining reps. Patient educated on building upon reps and strengthening exercises to which she reported understanding.     Personal Factors and Comorbidities Comorbidity 3+    Comorbidities craniotomy 09/11/2020, HTN, osteoporosis, history of shoulder surgery    Examination-Activity Limitations Locomotion Level    Stability/Clinical Decision Making Stable/Uncomplicated    Clinical Decision Making Low    Rehab Potential Good    PT Frequency 2x / week    PT Duration 6 weeks    PT Treatment/Interventions ADLs/Self Care Home Management;Gait training;Stair training;Functional mobility training;Therapeutic activities;Therapeutic  exercise;Balance training;Neuromuscular re-education;Manual techniques;Passive range of motion;Patient/family education    PT Next Visit Plan nustep, LE strengthening, gait training, balance training, core stability.    PT Home Exercise Plan cont. HEP provided by inpatient PT    Consulted and Agree with Plan of Care Patient           Patient will benefit from skilled therapeutic intervention in order to improve the following deficits and impairments:  Abnormal gait,Difficulty walking,Pain,Decreased balance,Decreased activity tolerance  Visit Diagnosis: Muscle weakness (generalized)  Unsteadiness on feet     Problem List Patient Active Problem List   Diagnosis Date Noted  . Peripheral edema   . Hypoalbuminemia due to protein-calorie malnutrition ([place])   . Transaminitis   . Tremor   . Mass of brain   . Seizure prophylaxis   . Iron deficiency anemia   . CKD (chronic kidney disease), stage III ([place]) 09/10/2020  . AMS (altered mental status) 09/04/2020  . Vasogenic Cerebral edema ([place]) 09/04/2020  . Meningioma ([place]) 09/04/2020  . Generalized weakness 09/04/2020  . Cancer of skin, squamous cell 09/04/2020  . Nephrolithiasis 09/04/2020  . UTI (urinary tract infection) 09/04/2020  . Essential hypertension 11/01/2015  . Osteoporosis 10/30/2015    Gabriela Eves, PT, DPT 10/14/2020, 3:22 PM  [facility] [address] [place], [place], [address] Phone: [phone]   Fax:  [phone]  Name: [name] Page MRN: [id number] Date of Birth: 1950-12-17

## 2020-10-15 ENCOUNTER — Encounter: Payer: Self-pay | Admitting: Registered Nurse

## 2020-10-15 ENCOUNTER — Encounter: Payer: Medicare Other | Attending: Registered Nurse | Admitting: Registered Nurse

## 2020-10-15 ENCOUNTER — Other Ambulatory Visit: Payer: Self-pay

## 2020-10-15 VITALS — BP 148/78 | HR 64 | Temp 98.7°F | Ht 62.0 in | Wt 133.0 lb

## 2020-10-15 DIAGNOSIS — R251 Tremor, unspecified: Secondary | ICD-10-CM | POA: Insufficient documentation

## 2020-10-15 DIAGNOSIS — C4492 Squamous cell carcinoma of skin, unspecified: Secondary | ICD-10-CM | POA: Insufficient documentation

## 2020-10-15 DIAGNOSIS — E785 Hyperlipidemia, unspecified: Secondary | ICD-10-CM | POA: Diagnosis not present

## 2020-10-15 DIAGNOSIS — I10 Essential (primary) hypertension: Secondary | ICD-10-CM | POA: Insufficient documentation

## 2020-10-15 DIAGNOSIS — D329 Benign neoplasm of meninges, unspecified: Secondary | ICD-10-CM | POA: Insufficient documentation

## 2020-10-15 DIAGNOSIS — Z298 Encounter for other specified prophylactic measures: Secondary | ICD-10-CM | POA: Diagnosis not present

## 2020-10-15 NOTE — Progress Notes (Signed)
Subjective:    Patient ID: [name] Page, female    DOB: Feb 10, 1951, 70 y.o.   MRN: [id number]  HPI: [name] Page is a 70 y.o. 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 She presented to [facility] on 09/04/2020 with complaints of weakness, daughter reported intermittent episodes of forgetfulness and [name] Page reported she had a headache. Neurosurgery consulted.  CT Head W or WO Contrast:  IMPRESSION: Anterior cranial fossa meningioma causing severe vasogenic edema within the right frontal lobe. There is leftward midline shift with subfalcine herniation of the right cingulate gyrus with early entrapment of the left lateral ventricle.  CT Renal Stone Study:  IMPRESSION: 1. Mild right renal pelvis dilatation without significant hydroureter, secondary to a 3 mm stone in the mid to distal right ureter at the level of the upper sacrum. 2. Small intrarenal stones bilaterally. 3. Possible mild wall thickening of the distal descending/proximal sigmoid colon, could reflect a mild colitis of infectious, inflammatory, or ischemic etiology. 4. Gallstones.  CT Chest; Abdomen: Pelvis:  IMPRESSION: 1. No suspicious mass lesion or lymphadenopathy in the chest, abdomen, or pelvis. 2. Unchanged 3 mm calculus in the mid to distal right ureter. 3. Previously noted small bilateral renal calculi are obscured by excreted contrast. 4. 1.6 cm heterogeneously hypodense right thyroid nodule. Recommend thyroid US (ref: J Am Coll Radiol. 2015 Feb;12(2): 143-50). 5. Unchanged cholelithiasis.  [name] Page underwent : See Below by Dr Marcello Moores and Dr Ellene Route Right Pterional Craniotomy for resection of anterior skull base mass with Brain Lab Right General  APPLICATION OF CRANIAL NAVIGATION R     [name] Page was admitted to inpatient  Rehabilitation on 09/17/2020 and discharged home on 09/25/2020. She is going to outpatient therapy at [facility] in [place]. She denies any pain. She rates her pain 0. Also reports she has a good appetite.   Family in room.   Pain Inventory Average Pain 0 Pain Right Now 0 My pain is no pain  LOCATION OF PAIN  no  BOWEL Number of stools per week: 5 Oral laxative use No  Type of laxative n/a Enema or suppository use No  History of colostomy No  Incontinent No   BLADDER Normal In and out cath, frequency n/a Able to self cath n/a Bladder incontinence No  Frequent urination No  Leakage with coughing No  Difficulty starting stream No  Incomplete bladder emptying No    Mobility walk without assistance how many minutes can you walk? 10 ability to climb steps?  no do you drive?  no  Function retired  Neuro/Psych tremor  Prior Studies hospital f/u  Physicians involved in your care hospital f/u   Family History  Problem Relation Age of Onset  . Cancer Mother        unknown source, stomach and liver   . Atrial fibrillation Father   . Hyperlipidemia Father   . Hypertension Father   . Colon polyps Father   . Hypertension Sister   . Hyperlipidemia Sister   . Thyroid disease Sister   . Esophageal cancer Neg Hx   . Pancreatic cancer Neg Hx    Social History   Socioeconomic History  . Marital status: Legally Separated    Spouse name: Not on file  . Number of children: 1  . Years of education: 54  . Highest education  level: Not on file  Occupational History  . Not on file  Tobacco Use  . Smoking status: Never Smoker  . Smokeless tobacco: Never Used  Vaping Use  . Vaping Use: Never used  Substance and Sexual Activity  . Alcohol use: No  . Drug use: No  . Sexual activity: Not Currently  Other Topics Concern  . Not on file  Social History Narrative   Patient resides locally.  She is currently separated from her spouse.  She is living in a  mobile home near her daughter and 2 grandchildren and her father.   Social Determinants of Health   Financial Resource Strain: Not on file  Food Insecurity: Not on file  Transportation Needs: Not on file  Physical Activity: Not on file  Stress: Not on file  Social Connections: Not on file   Past Surgical History:  Procedure Laterality Date  . APPLICATION OF CRANIAL NAVIGATION Right 09/11/2020   Procedure: APPLICATION OF CRANIAL NAVIGATION;  Surgeon: Vallarie Mare, MD;  Location: [facility];  Service: Neurosurgery;  Laterality: Right;  . CRANIOTOMY Right 09/11/2020   Procedure: Right Pterional Craniotomy for resection of anterior skull base mass with Brain Lab;  Surgeon: Vallarie Mare, MD;  Location: [facility];  Service: Neurosurgery;  Laterality: Right;  . CYSTOSCOPY WITH RETROGRADE PYELOGRAM, URETEROSCOPY AND STENT PLACEMENT Bilateral 08/11/2013   Procedure: CYSTOSCOPY WITH RETROGRADE PYELOGRAM, URETEROSCOPY AND STENT PLACEMENT;  Surgeon: Alexis Frock, MD;  Location: [facility];  Service: Urology;  Laterality: Bilateral;  . CYSTOSCOPY WITH URETEROSCOPY AND STENT PLACEMENT Bilateral 08/29/2013   Procedure: CYSTOSCOPY WITH BILATERAL URETEROSCOPY AND STENT REPLACEMENTS, stone extraction;  Surgeon: Alexis Frock, MD;  Location: [facility];  Service: Urology;  Laterality: Bilateral;  . CYSTOSCOPY/RETROGRADE/URETEROSCOPY/STONE EXTRACTION WITH BASKET Bilateral 01/31/2016   Procedure: CYSTOSCOPY/BILATERAL RETROGRADE PYELOGRAMS/BILATERAL URETEROSCOPY/BILATERAL STONE EXTRACTION WITH BASKET/RIGHT URETERAL STENT PLACEMENT;  Surgeon: Raynelle Bring, MD;  Location: [facility];  Service: Urology;  Laterality: Bilateral;  . HEMIARTHROPLASTY SHOULDER FRACTURE Left 1992  . HOLMIUM LASER APPLICATION Bilateral [id number]   Procedure: HOLMIUM LASER APPLICATION;  Surgeon: Alexis Frock, MD;  Location[facility];  Service: Urology;  Laterality: Bilateral;   Past Medical History:   Diagnosis Date  . Arthritis   . Basal cell carcinoma   . 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 Cancer ([place])    melanoma  . Dysuria   . Frequency of urination   . Hematuria   . History of kidney stones   . Osteoporosis   . Renal calculus, bilateral   . 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 Urgency of urination   . Wears glasses    BP (!) 148/78   Pulse 64   Temp 98.7 F (37.1 C)   Ht 5\' 2"  (1.575 m)   Wt 133 lb (60.3 kg)   SpO2 93%   BMI 24.33 kg/m   Opioid Risk Score:   Fall Risk Score:  `1  Depression screen PHQ 2/9  Depression screen [facility] 2/9 10/15/2020 08/12/2020 07/31/2020 07/18/2020 10/10/2019 08/23/2018 08/14/2018  Decreased Interest 0 1 2 3 1  0 0  Down, Depressed, Hopeless 0 0 2 2 1  0 0  PHQ - 2 Score 0 1 4 5 2  0 0  Altered sleeping 0 3 0 2 0 - -  Tired, decreased energy 1 3 2 3  0 - -  Change in appetite 0 0 0 0 0 - -  Feeling bad or failure about yourself  0 0 0 2 0 - -  Trouble concentrating 1 0 1 2 0 - -  Moving slowly or fidgety/restless 0 3 0 1 0 - -  Suicidal thoughts 0 0 0 0 0 - -  PHQ-9 Score 2 10 7 15 2  - -  Difficult doing work/chores - Very difficult Somewhat difficult Very difficult Not difficult at all - -    Review of Systems  Constitutional: Negative.   HENT: Negative.   Eyes: Negative.   Respiratory: Negative.   Cardiovascular: Negative.   Gastrointestinal: Negative.   Endocrine: Negative.   Genitourinary: Negative.   Musculoskeletal: Negative.   Skin: Negative.   Allergic/Immunologic: Negative.   Neurological: Positive for tremors.  Hematological: Negative.   Psychiatric/Behavioral: Negative.   All other systems reviewed and are negative.      Objective:   Physical Exam Vitals and nursing note reviewed.  Constitutional:      Appearance: Normal appearance.  Cardiovascular:     Rate and Rhythm: Normal rate and regular rhythm.     Pulses: Normal pulses.     Heart sounds: Normal heart  sounds.  Pulmonary:     Effort: Pulmonary effort is normal.     Breath sounds: Normal breath sounds.  Musculoskeletal:     Cervical back: Normal range of motion and neck supple.     Comments: Normal Muscle Bulk and Muscle Testing Reveals:  Upper Extremities: Right: Full ROM and Muscle Strength 5/5 Left Upper Extremity: Decreased ROM 30 Degrees and Muscle Strength 5/5 Lower Extremities: Full ROM and Muscle Strength 5/5 Arises from Table with ease Narrow Based  Gait   Skin:    General: Skin is warm and dry.  Neurological:     Mental Status: She is alert and oriented to person, place, and time.  Psychiatric:        Mood and Affect: Mood normal.        Behavior: Behavior normal.           Assessment & Plan:  1. Meningioma: S/P Pterional Craniotomy for resection of anterior skull base mass with Brain Lab. Neurosurgery following. Continue Outpatient Therapy. Continue to  to Monitor 2Essential Hypertension: Continue current medication regimen. PCP Following.  3. Dyslipidemia: Continue current medication regimen. PCP following.  4. Tremor: Continue current medication regimen. PCP Following.  5. Seizure Prophylaxis: Continue current medication regimen. Neurosurgery following. Continue to monitor.  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 Continue to monitor.   F/U in 4- 6 weeks with Dr. Ranell Patrick

## 2020-10-16 ENCOUNTER — Ambulatory Visit: Payer: Medicare Other | Admitting: Physical Therapy

## 2020-10-16 ENCOUNTER — Encounter: Payer: Self-pay | Admitting: Physical Therapy

## 2020-10-16 ENCOUNTER — Other Ambulatory Visit: Payer: Self-pay

## 2020-10-16 DIAGNOSIS — R2681 Unsteadiness on feet: Secondary | ICD-10-CM | POA: Diagnosis not present

## 2020-10-16 DIAGNOSIS — M6281 Muscle weakness (generalized): Secondary | ICD-10-CM

## 2020-10-16 NOTE — Therapy (Signed)
[facility] [place], [place], [address] Phone: [phone]   Fax:  [phone]  Physical Therapy Treatment  Patient Details  Name: [name] Page MRN: [id number] Date of Birth: 08/08/1951 Referring Provider (PT): Lauraine Rinne, PA-C   Encounter Date: 10/16/2020   PT End of Session - 10/16/20 1450    Visit Number 5    Number of Visits 12    Date for PT Re-Evaluation 11/19/20    Authorization Type Medicare (CQ and KX modifier) Progress note every 10th visit    PT Start Time 1430    PT Stop Time 1512    PT Time Calculation (min) 42 min    Activity Tolerance Patient limited by fatigue;Patient tolerated treatment well    Behavior During Therapy [facility] for tasks assessed/performed           Past Medical History:  Diagnosis Date  . Arthritis   . Basal cell carcinoma   . 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 Cancer ([place])    melanoma  . Dysuria   . Frequency of urination   . Hematuria   . History of kidney stones   . Osteoporosis   . Renal calculus, bilateral   . 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 Urgency of urination   . Wears glasses     Past Surgical History:  Procedure Laterality Date  . APPLICATION OF CRANIAL NAVIGATION Right 09/11/2020   Procedure: APPLICATION OF CRANIAL NAVIGATION;  Surgeon: Vallarie Mare, MD;  Location: [facility];  Service: Neurosurgery;  Laterality: Right;  . CRANIOTOMY Right 09/11/2020   Procedure: Right Pterional Craniotomy for resection of anterior skull base mass with Brain Lab;  Surgeon: Vallarie Mare, MD;  Location: [facility];  Service: Neurosurgery;  Laterality: Right;  . CYSTOSCOPY WITH RETROGRADE PYELOGRAM, URETEROSCOPY AND STENT PLACEMENT Bilateral 08/11/2013   Procedure: CYSTOSCOPY WITH RETROGRADE PYELOGRAM, URETEROSCOPY AND STENT PLACEMENT;  Surgeon: Alexis Frock, MD;  Location[facility];  Service: Urology;  Laterality: Bilateral;   . CYSTOSCOPY WITH URETEROSCOPY AND STENT PLACEMENT Bilateral 08/29/2013   Procedure: CYSTOSCOPY WITH BILATERAL URETEROSCOPY AND STENT REPLACEMENTS, stone extraction;  Surgeon: Alexis Frock, MD;  Location: [facility];  Service: Urology;  Laterality: Bilateral;  . CYSTOSCOPY/RETROGRADE/URETEROSCOPY/STONE EXTRACTION WITH BASKET Bilateral 01/31/2016   Procedure: CYSTOSCOPY/BILATERAL RETROGRADE PYELOGRAMS/BILATERAL URETEROSCOPY/BILATERAL STONE EXTRACTION WITH BASKET/RIGHT URETERAL STENT PLACEMENT;  Surgeon: Raynelle Bring, MD;  Location[facility];  Service: Urology;  Laterality: Bilateral;  . HEMIARTHROPLASTY SHOULDER FRACTURE Left 1992  . HOLMIUM LASER APPLICATION Bilateral [id number]   Procedure: HOLMIUM LASER APPLICATION;  Surgeon: Alexis Frock, MD;  Location: [facility];  Service: Urology;  Laterality: Bilateral;    There were no vitals filed for this visit.   Subjective Assessment - 10/16/20 1435    Subjective COVID-19 screening performed upon arrival. Patient reports a little slow today. States she started her chemo pill yesterday and the side effects are fatigue and muscle soreness.    Patient is accompained by: Family member    Pertinent History craniotomy 09/11/2020, HTN, osteoporosis, history of shoulder surgery    Limitations House hold activities    Patient Stated Goals improve ability to do normal activities and walk without walker    Currently in Pain? No/denies              [facility] PT Assessment - 10/16/20 0001      Assessment   Medical Diagnosis  Meningioma    Referring Provider (PT) Lauraine Rinne, PA-C    Next MD Visit 11/2020    Prior Therapy yes      Precautions   Precautions Fall      Restrictions   Weight Bearing Restrictions No                         [facility] Adult PT Treatment/Exercise - 10/16/20 0001      Lumbar Exercises: Seated   Other Seated Lumbar Exercises seated reaching with 2# medicine ball for core  stability and strengthening. x20      Knee/Hip Exercises: Aerobic   Nustep level 3 x12 mins      Knee/Hip Exercises: Seated   Long Arc Quad Strengthening;Both;20 reps;Weights    Long Arc Quad Weight 3 lbs.    Marching Strengthening;Both;2 sets;10 reps    Marching Weights 3 lbs.    Hamstring Curl Strengthening;Both;20 reps    Hamstring Limitations red theraband      Knee/Hip Exercises: Supine   Bridges with Ball Squeeze AROM;Both;15 reps    Straight Leg Raises AROM;Both;1 set;10 reps                       PT Long Term Goals - 10/09/20 1610      PT LONG TERM GOAL #1   Title Patient will be independent with HEP and its progression    Time 6    Period Weeks    Status Partially Met      PT LONG TERM GOAL #2   Title Patient will demonstrate 4/5  bilateral LE MMT to improve stability during functional tasks.    Time 6    Period Weeks    Status On-going      PT LONG TERM GOAL #3   Title Patient will demonstrate a 4+ point improvement on Berg Balance Scale to decrease risk of falls.    Time 6    Period Weeks    Status On-going      PT LONG TERM GOAL #4   Title Patient will perform modifed 5x sit to stand test in 18 seconds or less with no UE support to improve balance and improve functional LE strength.    Time 6    Period Weeks    Status On-going                 Plan - 10/16/20 1458    Clinical Impression Statement Patient arrives with soreness and fatigue secondary to chemo pill but overall doing well. Patient was guided through exercises with good form and technique. Patient provided with rest breaks secondary to increased fatigue. Core stability and strengthening provided with good response despite L shoulder pain.    Personal Factors and Comorbidities Comorbidity 3+    Comorbidities craniotomy 09/11/2020, HTN, osteoporosis, history of shoulder surgery    Examination-Activity Limitations Locomotion Level    Stability/Clinical Decision Making  Stable/Uncomplicated    Clinical Decision Making Low    Rehab Potential Good    PT Frequency 2x / week    PT Duration 6 weeks    PT Treatment/Interventions ADLs/Self Care Home Management;Gait training;Stair training;Functional mobility training;Therapeutic activities;Therapeutic exercise;Balance training;Neuromuscular re-education;Manual techniques;Passive range of motion;Patient/family education    PT Next Visit Plan nustep, LE strengthening, gait training, balance training, core stability.    PT Home Exercise Plan cont. HEP provided by inpatient PT    Consulted and Agree with Plan of Care Patient  Patient will benefit from skilled therapeutic intervention in order to improve the following deficits and impairments:  Abnormal gait,Difficulty walking,Pain,Decreased balance,Decreased activity tolerance  Visit Diagnosis: Muscle weakness (generalized)  Unsteadiness on feet     Problem List Patient Active Problem List   Diagnosis Date Noted  . Peripheral edema   . Hypoalbuminemia due to protein-calorie malnutrition ([place])   . Transaminitis   . Tremor   . Mass of brain   . Seizure prophylaxis   . Iron deficiency anemia   . CKD (chronic kidney disease), stage III ([place]) 09/10/2020  . AMS (altered mental status) 09/04/2020  . Vasogenic Cerebral edema ([place]) 09/04/2020  . Meningioma ([place]) 09/04/2020  . Generalized weakness 09/04/2020  . Cancer of skin, squamous cell 09/04/2020  . Nephrolithiasis 09/04/2020  . UTI (urinary tract infection) 09/04/2020  . Essential hypertension 11/01/2015  . Osteoporosis 10/30/2015    Gabriela Eves, PT, DPT 10/16/2020, 5:02 PM  [facility] [address] [place], [place], [address] Phone: [phone]   Fax:  [phone]  Name: [name] Page MRN: [id number] Date of Birth: Nov 06, 1950

## 2020-10-17 ENCOUNTER — Other Ambulatory Visit: Payer: Self-pay | Admitting: Family Medicine

## 2020-10-17 ENCOUNTER — Encounter: Payer: Medicare Other | Admitting: Gastroenterology

## 2020-10-17 DIAGNOSIS — E782 Mixed hyperlipidemia: Secondary | ICD-10-CM

## 2020-10-20 ENCOUNTER — Ambulatory Visit: Payer: Self-pay | Admitting: Family Medicine

## 2020-10-21 ENCOUNTER — Other Ambulatory Visit: Payer: Self-pay

## 2020-10-21 ENCOUNTER — Ambulatory Visit: Payer: Medicare Other | Attending: Physician Assistant | Admitting: Physical Therapy

## 2020-10-21 DIAGNOSIS — R2681 Unsteadiness on feet: Secondary | ICD-10-CM | POA: Diagnosis not present

## 2020-10-21 DIAGNOSIS — M6281 Muscle weakness (generalized): Secondary | ICD-10-CM | POA: Diagnosis not present

## 2020-10-21 DIAGNOSIS — G9389 Other specified disorders of brain: Secondary | ICD-10-CM | POA: Diagnosis not present

## 2020-10-21 NOTE — Therapy (Signed)
[facility] [place], [place], [address] Phone: [phone]   Fax:  [phone]  Physical Therapy Treatment  Patient Details  Name: [name] Page MRN: [id number] Date of Birth: 03-24-1951 Referring Provider (PT): Lauraine Rinne, PA-C   Encounter Date: 10/21/2020   PT End of Session - 10/21/20 1441    Visit Number 6    Number of Visits 12    Date for PT Re-Evaluation 11/19/20    Authorization Type Medicare (CQ and KX modifier) Progress note every 10th visit    PT Start Time 1430    PT Stop Time 1514    PT Time Calculation (min) 44 min    Equipment Utilized During Treatment Other (comment)    Activity Tolerance Patient limited by fatigue;Patient tolerated treatment well    Behavior During Therapy [facility] for tasks assessed/performed           Past Medical History:  Diagnosis Date  . Arthritis   . Basal cell carcinoma   . 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 Cancer ([place])    melanoma  . Dysuria   . Frequency of urination   . Hematuria   . History of kidney stones   . Osteoporosis   . Renal calculus, bilateral   . 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 Urgency of urination   . Wears glasses     Past Surgical History:  Procedure Laterality Date  . APPLICATION OF CRANIAL NAVIGATION Right 09/11/2020   Procedure: APPLICATION OF CRANIAL NAVIGATION;  Surgeon: Vallarie Mare, MD;  Location: [place];  Service: Neurosurgery;  Laterality: Right;  . CRANIOTOMY Right 09/11/2020   Procedure: Right Pterional Craniotomy for resection of anterior skull base mass with Brain Lab;  Surgeon: Vallarie Mare, MD;  Location: [place];  Service: Neurosurgery;  Laterality: Right;  . CYSTOSCOPY WITH RETROGRADE PYELOGRAM, URETEROSCOPY AND STENT PLACEMENT Bilateral 08/11/2013   Procedure: CYSTOSCOPY WITH RETROGRADE PYELOGRAM, URETEROSCOPY AND STENT PLACEMENT;  Surgeon: Alexis Frock, MD;   [facility];  Service: Urology;  Laterality: Bilateral;  . CYSTOSCOPY WITH URETEROSCOPY AND STENT PLACEMENT Bilateral 08/29/2013   Procedure: CYSTOSCOPY WITH BILATERAL URETEROSCOPY AND STENT REPLACEMENTS, stone extraction;  Surgeon: Alexis Frock, MD;  Location: [facility];  Service: Urology;  Laterality: Bilateral;  . CYSTOSCOPY/RETROGRADE/URETEROSCOPY/STONE EXTRACTION WITH BASKET Bilateral 01/31/2016   Procedure: CYSTOSCOPY/BILATERAL RETROGRADE PYELOGRAMS/BILATERAL URETEROSCOPY/BILATERAL STONE EXTRACTION WITH BASKET/RIGHT URETERAL STENT PLACEMENT;  Surgeon: Raynelle Bring, MD;  [facility];  Service: Urology;  Laterality: Bilateral;  . HEMIARTHROPLASTY SHOULDER FRACTURE Left 1992  . HOLMIUM LASER APPLICATION Bilateral 03/00/9233   Procedure: HOLMIUM LASER APPLICATION;  Surgeon: Alexis Frock, MD;  Location: [facility];  Service: Urology;  Laterality: Bilateral;    There were no vitals filed for this visit.   Subjective Assessment - 10/21/20 1441    Subjective COVID-19 screening performed upon arrival. Patient reports a little sore after last session but overall doing well.    Patient is accompained by: Family member    Pertinent History craniotomy 09/11/2020, HTN, osteoporosis, history of shoulder surgery    Limitations House hold activities    Patient Stated Goals improve ability to do normal activities and walk without walker    Currently in Pain? No/denies              [facility] PT Assessment - 10/21/20 0001      Assessment   Medical Diagnosis Meningioma  Referring Provider (PT) Lauraine Rinne, PA-C    Next MD Visit 11/2020    Prior Therapy yes      Precautions   Precautions Fall      Restrictions   Weight Bearing Restrictions No                         [facility] Adult PT Treatment/Exercise - 10/21/20 0001      Exercises   Exercises Knee/Hip;Lumbar      Knee/Hip Exercises: Aerobic   Nustep level 3 x15 mins       Knee/Hip Exercises: Standing   Hip Abduction Stengthening;Both;15 reps;Knee straight    Forward Step Up Both;1 set;Hand Hold: 2;Step Height: 6";10 reps      Knee/Hip Exercises: Seated   Long Arc Quad Strengthening;Both;20 reps;Weights    Long Arc Quad Weight 3 lbs.    Marching Strengthening;Both;2 sets;10 reps      Knee/Hip Exercises: Supine   Short Arc Quad Sets AROM;Both;2 sets;10 reps    Short Arc Quad Sets Limitations 3# 3" hold    Terminal Knee Extension Strengthening;Both;20 reps;Theraband    Theraband Level (Terminal Knee Extension) Level 1 (Yellow)    Bridges with Ball Squeeze AROM;Both;15 reps      Knee/Hip Exercises: Sidelying   Clams x20 each                       PT Long Term Goals - 10/09/20 1610      PT LONG TERM GOAL #1   Title Patient will be independent with HEP and its progression    Time 6    Period Weeks    Status Partially Met      PT LONG TERM GOAL #2   Title Patient will demonstrate 4/5  bilateral LE MMT to improve stability during functional tasks.    Time 6    Period Weeks    Status On-going      PT LONG TERM GOAL #3   Title Patient will demonstrate a 4+ point improvement on Berg Balance Scale to decrease risk of falls.    Time 6    Period Weeks    Status On-going      PT LONG TERM GOAL #4   Title Patient will perform modifed 5x sit to stand test in 18 seconds or less with no UE support to improve balance and improve functional LE strength.    Time 6    Period Weeks    Status On-going                 Plan - 10/21/20 1504    Clinical Impression Statement Patient responded well to therapy session with no reports of increase pain, just reports of fatigue. Patient noted with msucle fasciculations particularly with SAQ but overall performed well with good technique. Patient and PT discussed progressing TEs and standing activities to build more muscular endurance. Patient reported understanding.    Personal Factors and  Comorbidities Comorbidity 3+    Comorbidities craniotomy 09/11/2020, HTN, osteoporosis, history of shoulder surgery    Examination-Activity Limitations Locomotion Level    Stability/Clinical Decision Making Stable/Uncomplicated    Clinical Decision Making Low    Rehab Potential Good    PT Frequency 2x / week    PT Duration 6 weeks    PT Treatment/Interventions ADLs/Self Care Home Management;Gait training;Stair training;Functional mobility training;Therapeutic activities;Therapeutic exercise;Balance training;Neuromuscular re-education;Manual techniques;Passive range of motion;Patient/family education    PT Next Visit Plan  Assess goals; nustep, LE strengthening, gait training, balance training, core stability.    PT Home Exercise Plan cont. HEP provided by inpatient PT    Consulted and Agree with Plan of Care Patient           Patient will benefit from skilled therapeutic intervention in order to improve the following deficits and impairments:  Abnormal gait,Difficulty walking,Pain,Decreased balance,Decreased activity tolerance  Visit Diagnosis: Muscle weakness (generalized)  Unsteadiness on feet     Problem List Patient Active Problem List   Diagnosis Date Noted  . Peripheral edema   . Hypoalbuminemia due to protein-calorie malnutrition ([place])   . Transaminitis   . Tremor   . Mass of brain   . Seizure prophylaxis   . Iron deficiency anemia   . CKD (chronic kidney disease), stage III ([place]) 09/10/2020  . AMS (altered mental status) 09/04/2020  . Vasogenic Cerebral edema ([place]) 09/04/2020  . Meningioma ([place]) 09/04/2020  . Generalized weakness 09/04/2020  . Cancer of skin, squamous cell 09/04/2020  . Nephrolithiasis 09/04/2020  . UTI (urinary tract infection) 09/04/2020  . Essential hypertension 11/01/2015  . Osteoporosis 10/30/2015    Gabriela Eves, PT, DPT 10/21/2020, 3:22 PM  [facility] [address] [place], [place],  [address] Phone: [phone]   Fax:  [phone]  Name: [name] Page MRN: [id number] Date of Birth: [birth year]

## 2020-10-23 ENCOUNTER — Encounter: Payer: Self-pay | Admitting: Physical Therapy

## 2020-10-23 ENCOUNTER — Ambulatory Visit: Payer: Medicare Other | Admitting: Physical Therapy

## 2020-10-23 ENCOUNTER — Other Ambulatory Visit: Payer: Self-pay

## 2020-10-23 DIAGNOSIS — M6281 Muscle weakness (generalized): Secondary | ICD-10-CM | POA: Diagnosis not present

## 2020-10-23 DIAGNOSIS — R2681 Unsteadiness on feet: Secondary | ICD-10-CM

## 2020-10-23 DIAGNOSIS — G9389 Other specified disorders of brain: Secondary | ICD-10-CM | POA: Diagnosis not present

## 2020-10-23 NOTE — Therapy (Signed)
[facility] [place], [place], [address] Phone: [phone]   Fax:  [phone]  Physical Therapy Treatment  Patient Details  Name: [name] Page MRN: [id number] Date of Birth: 12/18/1950 Referring Provider (PT): Lauraine Rinne, PA-C   Encounter Date: 10/23/2020   PT End of Session - 10/23/20 1718    Visit Number 7    Number of Visits 12    Date for PT Re-Evaluation 11/19/20    Authorization Type Medicare (CQ and KX modifier) Progress note every 10th visit    PT Start Time 1430    PT Stop Time 1516    PT Time Calculation (min) 46 min    Activity Tolerance Patient limited by fatigue;Patient tolerated treatment well    Behavior During Therapy [facility] for tasks assessed/performed           Past Medical History:  Diagnosis Date  . Arthritis   . Basal cell carcinoma   . 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 Cancer ([place])    melanoma  . Dysuria   . Frequency of urination   . Hematuria   . History of kidney stones   . Osteoporosis   . Renal calculus, bilateral   . 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 Urgency of urination   . Wears glasses     Past Surgical History:  Procedure Laterality Date  . APPLICATION OF CRANIAL NAVIGATION Right 09/11/2020   Procedure: APPLICATION OF CRANIAL NAVIGATION;  Surgeon: Vallarie Mare, MD;  Location: [place];  Service: Neurosurgery;  Laterality: Right;  . CRANIOTOMY Right 09/11/2020   Procedure: Right Pterional Craniotomy for resection of anterior skull base mass with Brain Lab;  Surgeon: Vallarie Mare, MD;  Location: [place];  Service: Neurosurgery;  Laterality: Right;  . CYSTOSCOPY WITH RETROGRADE PYELOGRAM, URETEROSCOPY AND STENT PLACEMENT Bilateral 08/11/2013   Procedure: CYSTOSCOPY WITH RETROGRADE PYELOGRAM, URETEROSCOPY AND STENT PLACEMENT;  Surgeon: Alexis Frock, MD;  [facility];  Service: Urology;  Laterality: Bilateral;  .  CYSTOSCOPY WITH URETEROSCOPY AND STENT PLACEMENT Bilateral 08/29/2013   Procedure: CYSTOSCOPY WITH BILATERAL URETEROSCOPY AND STENT REPLACEMENTS, stone extraction;  Surgeon: Alexis Frock, MD;  Location: [facility];  Service: Urology;  Laterality: Bilateral;  . CYSTOSCOPY/RETROGRADE/URETEROSCOPY/STONE EXTRACTION WITH BASKET Bilateral 01/31/2016   Procedure: CYSTOSCOPY/BILATERAL RETROGRADE PYELOGRAMS/BILATERAL URETEROSCOPY/BILATERAL STONE EXTRACTION WITH BASKET/RIGHT URETERAL STENT PLACEMENT;  Surgeon: Raynelle Bring, MD;  [facility];  Service: Urology;  Laterality: Bilateral;  . HEMIARTHROPLASTY SHOULDER FRACTURE Left 1992  . HOLMIUM LASER APPLICATION Bilateral 97/53/0051   Procedure: HOLMIUM LASER APPLICATION;  Surgeon: Alexis Frock, MD;  Location: [facility];  Service: Urology;  Laterality: Bilateral;    There were no vitals filed for this visit.   Subjective Assessment - 10/23/20 1705    Subjective COVID-19 screening performed upon arrival. Patient reports doing a lot better with functional activities at home but still has difficulties with getting in/out of bed    Pertinent History craniotomy 09/11/2020, HTN, osteoporosis, history of shoulder surgery    Limitations House hold activities    Patient Stated Goals improve ability to do normal activities and walk without walker    Currently in Pain? No/denies              [facility] PT Assessment - 10/23/20 0001      Assessment   Medical Diagnosis Meningioma    Referring Provider (PT) Lauraine Rinne, PA-C  Next MD Visit 11/2020    Prior Therapy yes      Precautions   Precautions Fall      Restrictions   Weight Bearing Restrictions No      Strength   Right Hip Flexion 4-/5    Left Hip Flexion 4-/5    Right Knee Flexion 4-/5    Right Knee Extension 4/5    Left Knee Flexion 4/5    Left Knee Extension 4/5      Berg Balance Test   Sit to Stand Able to stand without using hands and stabilize  independently    Standing Unsupported Able to stand safely 2 minutes    Sitting with Back Unsupported but Feet Supported on Floor or Stool Able to sit safely and securely 2 minutes    Stand to Sit Sits safely with minimal use of hands    Transfers Able to transfer safely, minor use of hands    Standing Unsupported with Eyes Closed Able to stand 10 seconds with supervision    Standing Unsupported with Feet Together Able to place feet together independently and stand for 1 minute with supervision    From Standing, Reach Forward with Outstretched Arm Can reach confidently >25 cm (10")    From Standing Position, Pick up Object from [place] to pick up shoe safely and easily    From Standing Position, Turn to Look Behind Over each Shoulder Looks behind from both sides and weight shifts well    Turn 360 Degrees Able to turn 360 degrees safely in 4 seconds or less    Standing Unsupported, Alternately Place Feet on Step/Stool Able to stand independently and complete 8 steps >20 seconds    Standing Unsupported, One Foot in Front Able to plae foot ahead of the other independently and hold 30 seconds    Standing on One Leg Able to lift leg independently and hold equal to or more than 3 seconds    Total Score 50                         [facility] Adult PT Treatment/Exercise - 10/23/20 0001      Exercises   Exercises Knee/Hip;Lumbar      Lumbar Exercises: Seated   Other Seated Lumbar Exercises trunk rotation with 5# x15      Lumbar Exercises: Sidelying   Other Sidelying Lumbar Exercises trunk rotation bilaterally x15      Knee/Hip Exercises: Aerobic   Nustep level 3 x15 mins      Knee/Hip Exercises: Standing   Forward Step Up Both;1 set;15 reps;Hand Hold: 2;Step Height: 6"                       PT Long Term Goals - 10/23/20 1504      PT LONG TERM GOAL #1   Title Patient will be independent with HEP and its progression    Time 6    Period Weeks    Status Achieved       PT LONG TERM GOAL #2   Title Patient will demonstrate 4/5  bilateral LE MMT to improve stability during functional tasks.    Time 6    Period Weeks    Status On-going      PT LONG TERM GOAL #3   Title Patient will demonstrate a 4+ point improvement on Berg Balance Scale to decrease risk of falls.    Time 6    Period Weeks  Status Achieved   50/56     PT LONG TERM GOAL #4   Title Patient will perform modifed 5x sit to stand test in 18 seconds or less with no UE support to improve balance and improve functional LE strength.    Time 6    Period Weeks    Status On-going                 Plan - 10/23/20 1706    Clinical Impression Statement Patient was able to tolerate treatment well with light reports of fatigue but overall doing well. Patient improved significantly with Merrilee Jansky Balance Scale categorizing her as a low fall risk. Patient's Merrilee Jansky goal was met. Patient still with ongoing LE MMT deficits but has made improvements. Patient has difficulties with bed mobility particularly with supine to sit. Sidelying trunk rotation initiated for bed mobility with good response. Continue PT to improve strength and mobility.    Personal Factors and Comorbidities Comorbidity 3+    Comorbidities craniotomy 09/11/2020, HTN, osteoporosis, history of shoulder surgery    Examination-Activity Limitations Locomotion Level    Stability/Clinical Decision Making Stable/Uncomplicated    Clinical Decision Making Low    Rehab Potential Good    PT Frequency 2x / week    PT Duration 6 weeks    PT Treatment/Interventions ADLs/Self Care Home Management;Gait training;Stair training;Functional mobility training;Therapeutic activities;Therapeutic exercise;Balance training;Neuromuscular re-education;Manual techniques;Passive range of motion;Patient/family education    PT Next Visit Plan continue nustep, LE strengthening, gait training, balance training, core stability, trunk rotation for improved bed  mobility.    Consulted and Agree with Plan of Care Patient           Patient will benefit from skilled therapeutic intervention in order to improve the following deficits and impairments:  Abnormal gait,Difficulty walking,Pain,Decreased balance,Decreased activity tolerance  Visit Diagnosis: Muscle weakness (generalized)  Unsteadiness on feet     Problem List Patient Active Problem List   Diagnosis Date Noted  . Peripheral edema   . Hypoalbuminemia due to protein-calorie malnutrition ([place])   . Transaminitis   . Tremor   . Mass of brain   . Seizure prophylaxis   . Iron deficiency anemia   . CKD (chronic kidney disease), stage III ([place]) 09/10/2020  . AMS (altered mental status) 09/04/2020  . Vasogenic Cerebral edema ([place]) 09/04/2020  . Meningioma ([place]) 09/04/2020  . Generalized weakness 09/04/2020  . Cancer of skin, squamous cell 09/04/2020  . Nephrolithiasis 09/04/2020  . UTI (urinary tract infection) 09/04/2020  . Essential hypertension 11/01/2015  . Osteoporosis 10/30/2015    Gabriela Eves, PT, DPT 10/23/2020, 5:20 PM  [facility] [address] [place], [place], [address] Phone: [phone]   Fax:  [phone]  Name: SHARVI MOONEYHAN MRN: [id number] Date of Birth: 1951/06/19

## 2020-10-25 ENCOUNTER — Other Ambulatory Visit: Payer: Self-pay | Admitting: Nurse Practitioner

## 2020-10-25 ENCOUNTER — Other Ambulatory Visit: Payer: Self-pay | Admitting: Family Medicine

## 2020-10-25 MED ORDER — PROPRANOLOL HCL 10 MG PO TABS: 10.0000 mg | ORAL_TABLET | Freq: Three times a day (TID) | ORAL | 0 refills | Status: DC

## 2020-10-25 MED ORDER — HYDRALAZINE HCL 50 MG PO TABS: 50.0000 mg | ORAL_TABLET | Freq: Three times a day (TID) | ORAL | 0 refills | Status: DC

## 2020-10-28 ENCOUNTER — Other Ambulatory Visit: Payer: Self-pay

## 2020-10-28 ENCOUNTER — Ambulatory Visit: Payer: Medicare Other | Admitting: Physical Therapy

## 2020-10-28 ENCOUNTER — Encounter: Payer: Self-pay | Admitting: Physical Therapy

## 2020-10-28 DIAGNOSIS — R2681 Unsteadiness on feet: Secondary | ICD-10-CM | POA: Diagnosis not present

## 2020-10-28 DIAGNOSIS — G9389 Other specified disorders of brain: Secondary | ICD-10-CM | POA: Diagnosis not present

## 2020-10-28 DIAGNOSIS — M6281 Muscle weakness (generalized): Secondary | ICD-10-CM | POA: Diagnosis not present

## 2020-10-28 NOTE — Therapy (Signed)
[facility] [place], [place], [address] Phone: [phone]   Fax:  [phone]  Physical Therapy Treatment  Patient Details  Name: [name] Page MRN: [id number] Date of Birth: 1951-08-29 Referring Provider (PT): Lauraine Rinne, PA-C   Encounter Date: 10/28/2020   PT End of Session - 10/28/20 1446    Visit Number 8    Number of Visits 12    Date for PT Re-Evaluation 11/19/20    Authorization Type Medicare (CQ and KX modifier) Progress note every 10th visit    PT Start Time 1430    PT Stop Time 1514    PT Time Calculation (min) 44 min    Activity Tolerance Patient limited by fatigue;Patient tolerated treatment well    Behavior During Therapy [facility] for tasks assessed/performed           Past Medical History:  Diagnosis Date  . Arthritis   . Basal cell carcinoma   . 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 Cancer ([place])    melanoma  . Dysuria   . Frequency of urination   . Hematuria   . History of kidney stones   . Osteoporosis   . Renal calculus, bilateral   . 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 Urgency of urination   . Wears glasses     Past Surgical History:  Procedure Laterality Date  . APPLICATION OF CRANIAL NAVIGATION Right 09/11/2020   Procedure: APPLICATION OF CRANIAL NAVIGATION;  Surgeon: Vallarie Mare, MD;  Location: [place];  Service: Neurosurgery;  Laterality: Right;  . CRANIOTOMY Right 09/11/2020   Procedure: Right Pterional Craniotomy for resection of anterior skull base mass with Brain Lab;  Surgeon: Vallarie Mare, MD;  Location: [place];  Service: Neurosurgery;  Laterality: Right;  . CYSTOSCOPY WITH RETROGRADE PYELOGRAM, URETEROSCOPY AND STENT PLACEMENT Bilateral 08/11/2013   Procedure: CYSTOSCOPY WITH RETROGRADE PYELOGRAM, URETEROSCOPY AND STENT PLACEMENT;  Surgeon: Alexis Frock, MD;  [facility];  Service: Urology;  Laterality: Bilateral;  .  CYSTOSCOPY WITH URETEROSCOPY AND STENT PLACEMENT Bilateral 08/29/2013   Procedure: CYSTOSCOPY WITH BILATERAL URETEROSCOPY AND STENT REPLACEMENTS, stone extraction;  Surgeon: Alexis Frock, MD;  Location: [facility];  Service: Urology;  Laterality: Bilateral;  . CYSTOSCOPY/RETROGRADE/URETEROSCOPY/STONE EXTRACTION WITH BASKET Bilateral 01/31/2016   Procedure: CYSTOSCOPY/BILATERAL RETROGRADE PYELOGRAMS/BILATERAL URETEROSCOPY/BILATERAL STONE EXTRACTION WITH BASKET/RIGHT URETERAL STENT PLACEMENT;  Surgeon: Raynelle Bring, MD;  [facility];  Service: Urology;  Laterality: Bilateral;  . HEMIARTHROPLASTY SHOULDER FRACTURE Left 1992  . HOLMIUM LASER APPLICATION Bilateral 38/18/2993   Procedure: HOLMIUM LASER APPLICATION;  Surgeon: Alexis Frock, MD;  Location: [facility];  Service: Urology;  Laterality: Bilateral;    There were no vitals filed for this visit.   Subjective Assessment - 10/28/20 1445    Subjective COVID-19 screening performed upon arrival. Patient reports doing alright, little sore this morning but overall doing well with no reports of soreness upon arrival.    Patient is accompained by: Family member    Pertinent History craniotomy 09/11/2020, HTN, osteoporosis, history of shoulder surgery    Limitations House hold activities    Patient Stated Goals improve ability to do normal activities and walk without walker    Currently in Pain? No/denies                             [facility] Adult PT Treatment/Exercise -  10/28/20 0001      Lumbar Exercises: Seated   Other Seated Lumbar Exercises yellow theraband scapular retractions x20    Other Seated Lumbar Exercises trunk rotation with 5# x15      Knee/Hip Exercises: Aerobic   Nustep level 3 x15 mins      Knee/Hip Exercises: Standing   Forward Step Up Both;1 set;15 reps;Hand Hold: 2;Step Height: 6"    Rocker Board 3 minutes    Other Standing Knee Exercises balance beam: lateral  stepping x2 mins; tandem walking x2 minutes with UE support      Knee/Hip Exercises: Seated   Long Arc Quad Strengthening;Both;20 reps;Weights    Long Arc Quad Weight 4 lbs.    Clamshell with TheraBand Green   x20   Marching Strengthening;Both;2 sets;10 reps    Marching Limitations green theraband                       PT Long Term Goals - 10/23/20 1504      PT LONG TERM GOAL #1   Title Patient will be independent with HEP and its progression    Time 6    Period Weeks    Status Achieved      PT LONG TERM GOAL #2   Title Patient will demonstrate 4/5  bilateral LE MMT to improve stability during functional tasks.    Time 6    Period Weeks    Status On-going      PT LONG TERM GOAL #3   Title Patient will demonstrate a 4+ point improvement on Berg Balance Scale to decrease risk of falls.    Time 6    Period Weeks    Status Achieved   50/56     PT LONG TERM GOAL #4   Title Patient will perform modifed 5x sit to stand test in 18 seconds or less with no UE support to improve balance and improve functional LE strength.    Time 6    Period Weeks    Status On-going                 Plan - 10/28/20 1511    Clinical Impression Statement Patient responded well to therapy session with progression of TEs and the progression of balance activities. Patient was able to tolerate more standing TEs with improved balance and good use of ankle strategies to maintain balance.    Personal Factors and Comorbidities Comorbidity 3+    Comorbidities craniotomy 09/11/2020, HTN, osteoporosis, history of shoulder surgery    Examination-Activity Limitations Locomotion Level    Stability/Clinical Decision Making Stable/Uncomplicated    Clinical Decision Making Low    Rehab Potential Good    PT Frequency 2x / week    PT Duration 6 weeks    PT Treatment/Interventions ADLs/Self Care Home Management;Gait training;Stair training;Functional mobility training;Therapeutic  activities;Therapeutic exercise;Balance training;Neuromuscular re-education;Manual techniques;Passive range of motion;Patient/family education    PT Next Visit Plan continue nustep, LE strengthening, gait training, balance training, core stability, trunk rotation for improved bed mobility.    PT Home Exercise Plan cont. HEP provided by inpatient PT    Consulted and Agree with Plan of Care Patient           Patient will benefit from skilled therapeutic intervention in order to improve the following deficits and impairments:  Abnormal gait,Difficulty walking,Pain,Decreased balance,Decreased activity tolerance  Visit Diagnosis: Muscle weakness (generalized)  Unsteadiness on feet     Problem List Patient Active Problem List  Diagnosis Date Noted  . Peripheral edema   . Hypoalbuminemia due to protein-calorie malnutrition ([place])   . Transaminitis   . Tremor   . Mass of brain   . Seizure prophylaxis   . Iron deficiency anemia   . CKD (chronic kidney disease), stage III ([place]) 09/10/2020  . AMS (altered mental status) 09/04/2020  . Vasogenic Cerebral edema ([place]) 09/04/2020  . Meningioma ([place]) 09/04/2020  . Generalized weakness 09/04/2020  . Cancer of skin, squamous cell 09/04/2020  . Nephrolithiasis 09/04/2020  . UTI (urinary tract infection) 09/04/2020  . Essential hypertension 11/01/2015  . Osteoporosis 10/30/2015    Gabriela Eves, PT, DPT 10/28/2020, 4:57 PM  [facility] [place], [place], [address] Phone: [phone]   Fax:  [phone]  Name: ALETHEA TERHAAR MRN: [id number] Date of Birth: [birth year]

## 2020-10-30 ENCOUNTER — Other Ambulatory Visit: Payer: Self-pay

## 2020-10-30 ENCOUNTER — Ambulatory Visit: Payer: Medicare Other | Admitting: Physical Therapy

## 2020-10-30 ENCOUNTER — Encounter: Payer: Self-pay | Admitting: Physical Therapy

## 2020-10-30 DIAGNOSIS — G9389 Other specified disorders of brain: Secondary | ICD-10-CM | POA: Diagnosis not present

## 2020-10-30 DIAGNOSIS — M6281 Muscle weakness (generalized): Secondary | ICD-10-CM

## 2020-10-30 DIAGNOSIS — R2681 Unsteadiness on feet: Secondary | ICD-10-CM | POA: Diagnosis not present

## 2020-10-30 NOTE — Therapy (Signed)
[facility] [place], [place], [address] Phone: [phone]   Fax:  [phone]  Physical Therapy Treatment  Patient Details  Name: [name] Page MRN: [id number] Date of Birth: 11-15-1950 Referring Provider (PT): Lauraine Rinne, PA-C   Encounter Date: 10/30/2020   PT End of Session - 10/30/20 1439    Visit Number 9    Number of Visits 12    Date for PT Re-Evaluation 11/19/20    Authorization Type Medicare (CQ and KX modifier) Progress note every 10th visit    PT Start Time 1430    PT Stop Time 1513    PT Time Calculation (min) 43 min    Activity Tolerance Patient limited by fatigue;Patient tolerated treatment well    Behavior During Therapy [facility] for tasks assessed/performed           Past Medical History:  Diagnosis Date  . Arthritis   . Basal cell carcinoma   . 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 Cancer ([place])    melanoma  . Dysuria   . Frequency of urination   . Hematuria   . History of kidney stones   . Osteoporosis   . Renal calculus, bilateral   . 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 Urgency of urination   . Wears glasses     Past Surgical History:  Procedure Laterality Date  . APPLICATION OF CRANIAL NAVIGATION Right 09/11/2020   Procedure: APPLICATION OF CRANIAL NAVIGATION;  Surgeon: Vallarie Mare, MD;  Location: [place];  Service: Neurosurgery;  Laterality: Right;  . CRANIOTOMY Right 09/11/2020   Procedure: Right Pterional Craniotomy for resection of anterior skull base mass with Brain Lab;  Surgeon: Vallarie Mare, MD;  Location: [place];  Service: Neurosurgery;  Laterality: Right;  . CYSTOSCOPY WITH RETROGRADE PYELOGRAM, URETEROSCOPY AND STENT PLACEMENT Bilateral 08/11/2013   Procedure: CYSTOSCOPY WITH RETROGRADE PYELOGRAM, URETEROSCOPY AND STENT PLACEMENT;  Surgeon: Alexis Frock, MD;  [facility];  Service: Urology;  Laterality: Bilateral;   . CYSTOSCOPY WITH URETEROSCOPY AND STENT PLACEMENT Bilateral 08/29/2013   Procedure: CYSTOSCOPY WITH BILATERAL URETEROSCOPY AND STENT REPLACEMENTS, stone extraction;  Surgeon: Alexis Frock, MD;  Location: [facility];  Service: Urology;  Laterality: Bilateral;  . CYSTOSCOPY/RETROGRADE/URETEROSCOPY/STONE EXTRACTION WITH BASKET Bilateral 01/31/2016   Procedure: CYSTOSCOPY/BILATERAL RETROGRADE PYELOGRAMS/BILATERAL URETEROSCOPY/BILATERAL STONE EXTRACTION WITH BASKET/RIGHT URETERAL STENT PLACEMENT;  Surgeon: Raynelle Bring, MD;  Location[facility];  Service: Urology;  Laterality: Bilateral;  . HEMIARTHROPLASTY SHOULDER FRACTURE Left 1992  . HOLMIUM LASER APPLICATION Bilateral 65/46/5035   Procedure: HOLMIUM LASER APPLICATION;  Surgeon: Alexis Frock, MD;  Location: [facility];  Service: Urology;  Laterality: Bilateral;    There were no vitals filed for this visit.   Subjective Assessment - 10/30/20 1437    Subjective COVID-19 screening performed upon arrival. Patient reports doing alright, little sore today.    Pertinent History craniotomy 09/11/2020, HTN, osteoporosis, history of shoulder surgery    Limitations House hold activities    Patient Stated Goals improve ability to do normal activities and walk without walker    Currently in Pain? No/denies              [facility] PT Assessment - 10/30/20 0001      Assessment   Medical Diagnosis Meningioma    Referring Provider (PT) Lauraine Rinne, PA-C    Next MD Visit 11/2020    Prior Therapy yes  Precautions   Precautions Fall      Restrictions   Weight Bearing Restrictions No                         [facility] Adult PT Treatment/Exercise - 10/30/20 0001      Lumbar Exercises: Seated   Other Seated Lumbar Exercises Red theraband rows x20 reps    Other Seated Lumbar Exercises Trunk rotation seated with 4# ball x10 reps      Knee/Hip Exercises: Aerobic   Nustep L3 x15 min      Knee/Hip  Exercises: Standing   Heel Raises Both;20 reps    Knee Flexion Strengthening;Both;2 sets;10 reps;Limitations    Knee Flexion Limitations 2#    Hip Flexion Stengthening;Both;2 sets;10 reps;Knee bent    Hip Flexion Limitations 2#    Hip Abduction Stengthening;Both;2 sets;10 reps;Knee straight;Limitations    Abduction Limitations 2#    Forward Step Up Both;1 set;15 reps;Hand Hold: 2;Step Height: 6"      Knee/Hip Exercises: Seated   Long Arc Quad Strengthening;Both;20 reps;Weights    Long Arc Quad Weight 5 lbs.    Clamshell with TheraBand Green   x30 reps   Marching Strengthening;Both;2 sets;10 reps    Marching Limitations green theraband                       PT Long Term Goals - 10/23/20 1504      PT LONG TERM GOAL #1   Title Patient will be independent with HEP and its progression    Time 6    Period Weeks    Status Achieved      PT LONG TERM GOAL #2   Title Patient will demonstrate 4/5  bilateral LE MMT to improve stability during functional tasks.    Time 6    Period Weeks    Status On-going      PT LONG TERM GOAL #3   Title Patient will demonstrate a 4+ point improvement on Berg Balance Scale to decrease risk of falls.    Time 6    Period Weeks    Status Achieved   50/56     PT LONG TERM GOAL #4   Title Patient will perform modifed 5x sit to stand test in 18 seconds or less with no UE support to improve balance and improve functional LE strength.    Time 6    Period Weeks    Status On-going                 Plan - 10/30/20 1529    Clinical Impression Statement Patient reports she is healing well following brain surgery and returning to functional activities such as walking her dog. Patient progressed with resistance intermittantly during therex with only reports of fatigue. Patient utilizes BUE support with standing exercises in parallel bars.    Personal Factors and Comorbidities Comorbidity 3+    Comorbidities craniotomy 09/11/2020, HTN,  osteoporosis, history of shoulder surgery    Examination-Activity Limitations Locomotion Level    Stability/Clinical Decision Making Stable/Uncomplicated    Rehab Potential Good    PT Frequency 2x / week    PT Duration 6 weeks    PT Treatment/Interventions ADLs/Self Care Home Management;Gait training;Stair training;Functional mobility training;Therapeutic activities;Therapeutic exercise;Balance training;Neuromuscular re-education;Manual techniques;Passive range of motion;Patient/family education    PT Next Visit Plan continue nustep, LE strengthening, gait training, balance training, core stability, trunk rotation for improved bed mobility.    PT Home  Exercise Plan cont. HEP provided by inpatient PT    Consulted and Agree with Plan of Care Patient           Patient will benefit from skilled therapeutic intervention in order to improve the following deficits and impairments:  Abnormal gait,Difficulty walking,Pain,Decreased balance,Decreased activity tolerance  Visit Diagnosis: Muscle weakness (generalized)  Unsteadiness on feet  Mass of brain     Problem List Patient Active Problem List   Diagnosis Date Noted  . Peripheral edema   . Hypoalbuminemia due to protein-calorie malnutrition ([place])   . Transaminitis   . Tremor   . Mass of brain   . Seizure prophylaxis   . Iron deficiency anemia   . CKD (chronic kidney disease), stage III ([place]) 09/10/2020  . AMS (altered mental status) 09/04/2020  . Vasogenic Cerebral edema ([place]) 09/04/2020  . Meningioma ([place]) 09/04/2020  . Generalized weakness 09/04/2020  . Cancer of skin, squamous cell 09/04/2020  . Nephrolithiasis 09/04/2020  . UTI (urinary tract infection) 09/04/2020  . Essential hypertension 11/01/2015  . Osteoporosis 10/30/2015    Standley Brooking, PTA 10/30/2020, 3:31 PM  [facility] [address] [place], [place], [address] Phone: [phone]   Fax:  [phone]  Name:  [name] Page MRN: [id number] Date of Birth: 01-23-1951

## 2020-10-31 ENCOUNTER — Ambulatory Visit (INDEPENDENT_AMBULATORY_CARE_PROVIDER_SITE_OTHER): Payer: Medicare Other | Admitting: Family Medicine

## 2020-10-31 VITALS — BP 130/67 | HR 95 | Temp 97.5°F | Wt 134.0 lb

## 2020-10-31 DIAGNOSIS — D500 Iron deficiency anemia secondary to blood loss (chronic): Secondary | ICD-10-CM | POA: Diagnosis not present

## 2020-10-31 DIAGNOSIS — Z9889 Other specified postprocedural states: Secondary | ICD-10-CM | POA: Diagnosis not present

## 2020-10-31 DIAGNOSIS — Z298 Encounter for other specified prophylactic measures: Secondary | ICD-10-CM

## 2020-10-31 DIAGNOSIS — Z8603 Personal history of neoplasm of uncertain behavior: Secondary | ICD-10-CM | POA: Diagnosis not present

## 2020-10-31 DIAGNOSIS — R251 Tremor, unspecified: Secondary | ICD-10-CM | POA: Diagnosis not present

## 2020-10-31 LAB — HEMOGLOBIN, FINGERSTICK: Hemoglobin: 9.6 g/dL — ABNORMAL LOW (ref 11.1–15.9)

## 2020-10-31 NOTE — Progress Notes (Signed)
Subjective: CC: Follow-up brain tumor removal PCP: Janora Norlander, DO DDU:KGURKY [name] Page is a 70 y.o. female, who is accompanied by her daughter to today's visit. She is presenting to clinic today for:  1. Status post brain tumor removal Patient had a meningioma resected in December 2021. This was discovered after she was developing severe headaches, dizziness. She is currently on seizure prophylaxis with Keppra but has had no seizure activity. She will be seeing her specialist again for follow-up scanning and reevaluation and hopefully the [place] will be discontinued at that point. She does report some difficulty with word finding and is not as quick to respond but she seems to be improving quite a bit from where she was. Her daughter notes that she would just stare off into space at times and that was very concerning because that is not her typical demeanor. She has had no problems with gait, vision, swallowing or balance. No reports of unilateral sensory loss or weakness. She does report a tremor and is currently being treated with propranolol 10 mg. She has ongoing tremor and would like to know if there is anything that we can do about this. Not currently under the care of neurology but is under the care of neurosurgery.    ROS: Per HPI  Allergies  Allergen Reactions  . Sulfa Antibiotics Rash   Past Medical History:  Diagnosis Date  . Arthritis   . Basal cell carcinoma   . 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 Cancer ([place])    melanoma  . Dysuria   . Frequency of urination   . Hematuria   . History of kidney stones   . Osteoporosis   . Renal calculus, bilateral   . 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 Urgency of urination   . Wears glasses     Current Outpatient Medications:  .  atorvastatin (LIPITOR) 20 MG tablet, TAKE 1 TABLET BY MOUTH EVERY DAY, Disp: 30 tablet, Rfl: 0 .  Calcium Carbonate-Vit D-Min (CALCIUM 1200 PO), Take  1 tablet by mouth daily., Disp: , Rfl:  .  docusate (COLACE) 50 MG/5ML liquid, Take 10 mLs (100 mg total) by mouth 2 (two) times daily., Disp: 100 mL, Rfl: 0 .  ERIVEDGE 150 MG capsule, Take by mouth., Disp: , Rfl:  .  escitalopram (LEXAPRO) 10 MG tablet, Take 1 tablet (10 mg total) by mouth daily., Disp: 90 tablet, Rfl: 1 .  ferrous sulfate 325 (65 FE) MG tablet, Take 1 tablet (325 mg total) by mouth daily with breakfast., Disp: 30 tablet, Rfl: 3 .  Glycerin-Hypromellose-PEG 400 0.2-0.2-1 % SOLN, Apply 1 drop to eye 2 (two) times daily as needed (dry eyes). , Disp: , Rfl:  .  hydrALAZINE (APRESOLINE) 50 MG tablet, Take 1 tablet (50 mg total) by mouth every 8 (eight) hours., Disp: 90 tablet, Rfl: 0 .  levETIRAcetam (KEPPRA) 100 MG/ML solution, Take 5 mLs (500 mg total) by mouth 2 (two) times daily., Disp: 473 mL, Rfl: 12 .  loratadine (CLARITIN) 10 MG tablet, Take 10 mg by mouth daily as needed for allergies., Disp: , Rfl:  .  Multiple Vitamin (MULTIVITAMIN WITH MINERALS) TABS tablet, Take 1 tablet by mouth daily., Disp: , Rfl:  .  propranolol (INDERAL) 10 MG tablet, Take 1 tablet (10 mg total) by mouth 3 (three) times daily., Disp: 90 tablet, Rfl: 0 Social History   Socioeconomic History  . Marital  status: Legally Separated    Spouse name: Not on file  . Number of children: 1  . Years of education: 34  . Highest education level: Not on file  Occupational History  . Not on file  Tobacco Use  . Smoking status: Never Smoker  . Smokeless tobacco: Never Used  Vaping Use  . Vaping Use: Never used  Substance and Sexual Activity  . Alcohol use: No  . Drug use: No  . Sexual activity: Not Currently  Other Topics Concern  . Not on file  Social History Narrative   Patient resides locally.  She is currently separated from her spouse.  She is living in a mobile home near her daughter and 2 grandchildren and her father.   Social Determinants of Health   Financial Resource Strain: Not on file   Food Insecurity: Not on file  Transportation Needs: Not on file  Physical Activity: Not on file  Stress: Not on file  Social Connections: Not on file  Intimate Partner Violence: Not on file   Family History  Problem Relation Age of Onset  . Cancer Mother        unknown source, stomach and liver   . Atrial fibrillation Father   . Hyperlipidemia Father   . Hypertension Father   . Colon polyps Father   . Hypertension Sister   . Hyperlipidemia Sister   . Thyroid disease Sister   . Esophageal cancer Neg Hx   . Pancreatic cancer Neg Hx     Objective: Office vital signs reviewed. BP 130/67   Pulse 95   Temp (!) 97.5 F (36.4 C)   Wt 134 lb (60.8 kg)   SpO2 95%   BMI 24.51 kg/m   Physical Examination:  General: Awake, alert, well nourished, No acute distress HEENT: Postsurgical site noted along the right side of her head. There is no evidence of secondary infection. PERRLA, EOMI Cardio: regular rate and rhythm, S1S2 heard, no murmurs appreciated Pulm: clear to auscultation bilaterally, no wheezes, rhonchi or rales; normal work of breathing on room air Extremities: warm, well perfused, No edema, cyanosis or clubbing; +2 pulses bilaterally MSK: Ambulating independently. Neuro: Alert and oriented. Conversive. No focal neurologic deficits.  Psych: Mood stable, speech normal, patient is pleasant and interactive  Assessment/ Plan: 71 y.o. female   Iron deficiency anemia due to chronic blood loss - Plan: CBC, Hemoglobin, fingerstick  History of resection of meningioma  Seizure prophylaxis - Plan: CMP14+EGFR, CBC, Levetiracetam level  Tremor - Plan: propranolol (INDERAL) 10 MG tablet  Check hemoglobin, CMP and Keppra level. We will plan to CC results to specialist but anticipate the Keppra will be discontinued when she is in the clear. She is having no concerning ongoing symptoms. She does have an ongoing tremor and for this reason we will try advancing her propranolol to 15  mg 3 times daily. I cautioned her daughter and the patient to watch her blood pressure and heart rate since this can be impacted by the medication. If she still has ongoing tremor but normal vital signs, could advance to 20 mg 3 times daily. Alternatively, we discussed referral to neurology and consideration of perhaps primidone. Unsure of the utility of primidone given her tremor that seems to be related to resection of the meningioma.  Orders Placed This Encounter  Procedures  . CMP14+EGFR  . CBC  . Hemoglobin, fingerstick  . Levetiracetam level   No orders of the defined types were placed in this encounter.  Janora Norlander, DO [facility] [phone]

## 2020-11-02 ENCOUNTER — Encounter: Payer: Self-pay | Admitting: Family Medicine

## 2020-11-02 DIAGNOSIS — Z9889 Other specified postprocedural states: Secondary | ICD-10-CM | POA: Insufficient documentation

## 2020-11-02 MED ORDER — PROPRANOLOL HCL 10 MG PO TABS: 15.0000 mg | ORAL_TABLET | Freq: Three times a day (TID) | ORAL | 0 refills | Status: DC

## 2020-11-04 ENCOUNTER — Ambulatory Visit: Payer: Medicare Other | Admitting: Physical Therapy

## 2020-11-04 ENCOUNTER — Other Ambulatory Visit: Payer: Self-pay

## 2020-11-04 DIAGNOSIS — M6281 Muscle weakness (generalized): Secondary | ICD-10-CM | POA: Diagnosis not present

## 2020-11-04 DIAGNOSIS — G9389 Other specified disorders of brain: Secondary | ICD-10-CM | POA: Diagnosis not present

## 2020-11-04 DIAGNOSIS — R2681 Unsteadiness on feet: Secondary | ICD-10-CM

## 2020-11-04 LAB — CBC
Hematocrit: 31.7 % — ABNORMAL LOW (ref 34.0–46.6)
Hemoglobin: 9.9 g/dL — ABNORMAL LOW (ref 11.1–15.9)
MCH: 24.8 pg — ABNORMAL LOW (ref 26.6–33.0)
MCHC: 31.2 g/dL — ABNORMAL LOW (ref 31.5–35.7)
MCV: 79 fL (ref 79–97)
Platelets: 295 10*3/uL (ref 150–450)
RBC: 3.99 x10E6/uL (ref 3.77–5.28)
RDW: 16.9 % — ABNORMAL HIGH (ref 11.7–15.4)
WBC: 6.8 10*3/uL (ref 3.4–10.8)

## 2020-11-04 LAB — CMP14+EGFR
ALT: 13 IU/L (ref 0–32)
AST: 16 IU/L (ref 0–40)
Albumin/Globulin Ratio: 1.2 (ref 1.2–2.2)
Albumin: 3.9 g/dL (ref 3.8–4.8)
Alkaline Phosphatase: 116 IU/L (ref 44–121)
BUN/Creatinine Ratio: 12 (ref 12–28)
BUN: 12 mg/dL (ref 8–27)
Bilirubin Total: <0.2 mg/dL (ref 0.0–1.2)
CO2: 23 mmol/L (ref 20–29)
Calcium: 9.4 mg/dL (ref 8.7–10.3)
Chloride: 105 mmol/L (ref 96–106)
Creatinine, Ser: 0.97 mg/dL (ref 0.57–1.00)
GFR calc Af Amer: 68 mL/min/{1.73_m2} (ref >59)
GFR calc non Af Amer: 59 mL/min/{1.73_m2} — ABNORMAL LOW (ref >59)
Globulin, Total: 3.3 g/dL (ref 1.5–4.5)
Glucose: 105 mg/dL — ABNORMAL HIGH (ref 65–99)
Potassium: 4.7 mmol/L (ref 3.5–5.2)
Sodium: 142 mmol/L (ref 134–144)
Total Protein: 7.2 g/dL (ref 6.0–8.5)

## 2020-11-04 LAB — LEVETIRACETAM LEVEL: Levetiracetam Lvl: 31.1 ug/mL (ref 10.0–40.0)

## 2020-11-04 NOTE — Therapy (Signed)
[facility] [place], [place], [address] Phone: [phone]   Fax:  [phone]  Physical Therapy Treatment   Progress Note Reporting Period 10/01/2020 to 11/04/2020  See note below for Objective Data and Assessment of Progress/Goals. Patient has made significant improvements in functional activities and endurance. Two visits remaining then DC to HEP.    Patient Details  Name: [name] Page MRN: [id number] Date of Birth: 07/10/1951 Referring Provider (PT): Lauraine Rinne, PA-C   Encounter Date: 11/04/2020   PT End of Session - 11/04/20 1526    Visit Number 10    Number of Visits 12    Date for PT Re-Evaluation 11/19/20    Authorization Type Medicare (CQ and KX modifier) Progress note every 10th visit    PT Start Time 1430    PT Stop Time 1515    PT Time Calculation (min) 45 min    Activity Tolerance Patient tolerated treatment well    Behavior During Therapy [facility] for tasks assessed/performed           Past Medical History:  Diagnosis Date  . Arthritis   . Basal cell carcinoma   . 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 Cancer ([place])    melanoma  . Dysuria   . Frequency of urination   . Hematuria   . History of kidney stones   . Osteoporosis   . Renal calculus, bilateral   . 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 Urgency of urination   . Wears glasses     Past Surgical History:  Procedure Laterality Date  . APPLICATION OF CRANIAL NAVIGATION Right 09/11/2020   Procedure: APPLICATION OF CRANIAL NAVIGATION;  Surgeon: Vallarie Mare, MD;  Location: [place];  Service: Neurosurgery;  Laterality: Right;  . CRANIOTOMY Right 09/11/2020   Procedure: Right Pterional Craniotomy for resection of anterior skull base mass with Brain Lab;  Surgeon: Vallarie Mare, MD;  Location: [place];  Service: Neurosurgery;  Laterality: Right;  . CYSTOSCOPY WITH RETROGRADE PYELOGRAM,  URETEROSCOPY AND STENT PLACEMENT Bilateral 08/11/2013   Procedure: CYSTOSCOPY WITH RETROGRADE PYELOGRAM, URETEROSCOPY AND STENT PLACEMENT;  Surgeon: Alexis Frock, MD;  [facility];  Service: Urology;  Laterality: Bilateral;  . CYSTOSCOPY WITH URETEROSCOPY AND STENT PLACEMENT Bilateral 08/29/2013   Procedure: CYSTOSCOPY WITH BILATERAL URETEROSCOPY AND STENT REPLACEMENTS, stone extraction;  Surgeon: Alexis Frock, MD;  Location[facility];  Service: Urology;  Laterality: Bilateral;  . CYSTOSCOPY/RETROGRADE/URETEROSCOPY/STONE EXTRACTION WITH BASKET Bilateral 01/31/2016   Procedure: CYSTOSCOPY/BILATERAL RETROGRADE PYELOGRAMS/BILATERAL URETEROSCOPY/BILATERAL STONE EXTRACTION WITH BASKET/RIGHT URETERAL STENT PLACEMENT;  Surgeon: Raynelle Bring, MD;  [facility];  Service: Urology;  Laterality: Bilateral;  . HEMIARTHROPLASTY SHOULDER FRACTURE Left 1992  . HOLMIUM LASER APPLICATION Bilateral 78/93/8101   Procedure: HOLMIUM LASER APPLICATION;  Surgeon: Alexis Frock, MD;  Location: [facility];  Service: Urology;  Laterality: Bilateral;    There were no vitals filed for this visit.       [facility] PT Assessment - 11/04/20 0001      Assessment   Medical Diagnosis Meningioma    Referring Provider (PT) Lauraine Rinne, PA-C    Next MD Visit 11/2020    Prior Therapy yes      Precautions   Precautions Fall      Transfers   Five time sit to stand comments  17 seconds with UE support; 15 seconds without UE support  [place] Adult PT Treatment/Exercise - 11/04/20 0001      Exercises   Exercises Knee/Hip      Knee/Hip Exercises: Aerobic   Nustep L4 x15 min      Knee/Hip Exercises: Standing   Knee Flexion Strengthening;Both;2 sets;10 reps;Limitations    Knee Flexion Limitations 2#    Hip Flexion Stengthening;Both;2 sets;10 reps;Knee bent    Hip Flexion Limitations 2#    Hip Abduction Stengthening;Both;2 sets;10  reps;Knee straight;Limitations    Abduction Limitations 2#    Forward Step Up Both;1 set;15 reps;Step Height: 6";Hand Hold: 1    Rocker Board 3 minutes   for balance   Other Standing Knee Exercises lateral stepping x2 mins with yellow theraband                       PT Long Term Goals - 11/04/20 1459      PT LONG TERM GOAL #1   Title Patient will be independent with HEP and its progression    Time 6    Period Weeks    Status Achieved      PT LONG TERM GOAL #2   Title Patient will demonstrate 4/5  bilateral LE MMT to improve stability during functional tasks.    Time 6    Period Weeks    Status On-going      PT LONG TERM GOAL #3   Title Patient will demonstrate a 4+ point improvement on Berg Balance Scale to decrease risk of falls.    Period Weeks    Status Achieved      PT LONG TERM GOAL #4   Title Patient will perform modifed 5x sit to stand test in 18 seconds or less with no UE support to improve balance and improve functional LE strength.    Time 6    Period Weeks    Status Achieved                 Plan - 11/04/20 1528    Clinical Impression Statement Patient responded well to therapy session with minimal reports of fatigue. Patient guided through more standing activities with one short seated rest break. Patient was provided with contact guard with rockerboard balance but was able to use appropriate strategies to stay uprights. Patient's modified 5x sit to stand goal achieved. Patient and PT discussed plan of care to continue strengthening and balance activities for remaining two visits then DC. Patient reported agreement.    Personal Factors and Comorbidities Comorbidity 3+    Comorbidities craniotomy 09/11/2020, HTN, osteoporosis, history of shoulder surgery    Examination-Activity Limitations Locomotion Level    Stability/Clinical Decision Making Stable/Uncomplicated    Clinical Decision Making Low    Rehab Potential Good    PT Frequency 2x / week     PT Duration 6 weeks    PT Treatment/Interventions ADLs/Self Care Home Management;Gait training;Stair training;Functional mobility training;Therapeutic activities;Therapeutic exercise;Balance training;Neuromuscular re-education;Manual techniques;Passive range of motion;Patient/family education    PT Next Visit Plan continue nustep, LE strengthening, gait training, balance training, core stability, trunk rotation for improved bed mobility.    PT Home Exercise Plan cont. HEP provided by inpatient PT    Consulted and Agree with Plan of Care Patient           Patient will benefit from skilled therapeutic intervention in order to improve the following deficits and impairments:  Abnormal gait,Difficulty walking,Pain,Decreased balance,Decreased activity tolerance  Visit Diagnosis: Muscle weakness (generalized)  Unsteadiness on feet  Problem List Patient Active Problem List   Diagnosis Date Noted  . History of resection of meningioma 11/02/2020  . Peripheral edema   . Hypoalbuminemia due to protein-calorie malnutrition ([place])   . Transaminitis   . Tremor   . Mass of brain   . Seizure prophylaxis   . Iron deficiency anemia   . CKD (chronic kidney disease), stage III ([place]) 09/10/2020  . AMS (altered mental status) 09/04/2020  . Vasogenic Cerebral edema ([place]) 09/04/2020  . Meningioma ([place]) 09/04/2020  . Generalized weakness 09/04/2020  . Cancer of skin, squamous cell 09/04/2020  . Nephrolithiasis 09/04/2020  . UTI (urinary tract infection) 09/04/2020  . Essential hypertension 11/01/2015  . Osteoporosis 10/30/2015    Gabriela Eves, PT, DPT 11/04/2020, 3:31 PM  [facility] [address] [place], [place], [address] Phone: [phone]   Fax:  [phone]  Name: [name] Page MRN: [id number] Date of Birth: Jun 19, 1951

## 2020-11-06 ENCOUNTER — Ambulatory Visit: Payer: Medicare Other | Admitting: Physical Therapy

## 2020-11-06 ENCOUNTER — Encounter: Payer: Self-pay | Admitting: Physical Therapy

## 2020-11-06 ENCOUNTER — Other Ambulatory Visit: Payer: Self-pay

## 2020-11-06 DIAGNOSIS — M6281 Muscle weakness (generalized): Secondary | ICD-10-CM | POA: Diagnosis not present

## 2020-11-06 DIAGNOSIS — G9389 Other specified disorders of brain: Secondary | ICD-10-CM | POA: Diagnosis not present

## 2020-11-06 DIAGNOSIS — R2681 Unsteadiness on feet: Secondary | ICD-10-CM | POA: Diagnosis not present

## 2020-11-06 NOTE — Therapy (Signed)
[facility] [place], [place], [address] Phone: [phone]   Fax:  [phone]  Physical Therapy Treatment  Patient Details  Name: [name] Page MRN: [id number] Date of Birth: March 31, 1951 Referring Provider (PT): Lauraine Rinne, PA-C   Encounter Date: 11/06/2020   PT End of Session - 11/06/20 1515    Visit Number 11    Number of Visits 12    Date for PT Re-Evaluation 11/19/20    Authorization Type Medicare (CQ and KX modifier) Progress note every 10th visit    PT Start Time 1430    PT Stop Time 1515    PT Time Calculation (min) 45 min    Activity Tolerance Patient tolerated treatment well    Behavior During Therapy [facility] for tasks assessed/performed           Past Medical History:  Diagnosis Date  . Arthritis   . Basal cell carcinoma   . 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 Cancer ([place])    melanoma  . Dysuria   . Frequency of urination   . Hematuria   . History of kidney stones   . Osteoporosis   . Renal calculus, bilateral   . 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 Urgency of urination   . Wears glasses     Past Surgical History:  Procedure Laterality Date  . APPLICATION OF CRANIAL NAVIGATION Right 09/11/2020   Procedure: APPLICATION OF CRANIAL NAVIGATION;  Surgeon: Vallarie Mare, MD;  Location: [facility];  Service: Neurosurgery;  Laterality: Right;  . CRANIOTOMY Right 09/11/2020   Procedure: Right Pterional Craniotomy for resection of anterior skull base mass with Brain Lab;  Surgeon: Vallarie Mare, MD;  Location: [facility];  Service: Neurosurgery;  Laterality: Right;  . CYSTOSCOPY WITH RETROGRADE PYELOGRAM, URETEROSCOPY AND STENT PLACEMENT Bilateral 08/11/2013   Procedure: CYSTOSCOPY WITH RETROGRADE PYELOGRAM, URETEROSCOPY AND STENT PLACEMENT;  Surgeon: Alexis Frock, MD;  Location: [facility];  Service: Urology;  Laterality: Bilateral;  . CYSTOSCOPY WITH  URETEROSCOPY AND STENT PLACEMENT Bilateral 08/29/2013   Procedure: CYSTOSCOPY WITH BILATERAL URETEROSCOPY AND STENT REPLACEMENTS, stone extraction;  Surgeon: Alexis Frock, MD;  Location: [facility];  Service: Urology;  Laterality: Bilateral;  . CYSTOSCOPY/RETROGRADE/URETEROSCOPY/STONE EXTRACTION WITH BASKET Bilateral 01/31/2016   Procedure: CYSTOSCOPY/BILATERAL RETROGRADE PYELOGRAMS/BILATERAL URETEROSCOPY/BILATERAL STONE EXTRACTION WITH BASKET/RIGHT URETERAL STENT PLACEMENT;  Surgeon: Raynelle Bring, MD;  Location: [facility];  Service: Urology;  Laterality: Bilateral;  . HEMIARTHROPLASTY SHOULDER FRACTURE Left 1992  . HOLMIUM LASER APPLICATION Bilateral 31/51/7616   Procedure: HOLMIUM LASER APPLICATION;  Surgeon: Alexis Frock, MD;  Location[facility];  Service: Urology;  Laterality: Bilateral;    There were no vitals filed for this visit.   Subjective Assessment - 11/06/20 1514    Subjective COVID-19 screening performed upon arrival. Patient reports walking her dog and doing home activities that made her sore but was happy she was able to complete them independently.    Pertinent History craniotomy 09/11/2020, HTN, osteoporosis, history of shoulder surgery    Limitations House hold activities    Patient Stated Goals improve ability to do normal activities and walk without walker    Currently in Pain? No/denies              [facility] PT Assessment - 11/06/20 0001      Assessment   Medical Diagnosis Meningioma    Referring Provider (PT) Lauraine Rinne, PA-C  Next MD Visit 11/2020    Prior Therapy yes      Precautions   Precautions Fall                         [facility] Adult PT Treatment/Exercise - 11/06/20 0001      Exercises   Exercises Knee/Hip      Lumbar Exercises: Sidelying   Other Sidelying Lumbar Exercises trunk rotation bilaterally x10      Knee/Hip Exercises: Aerobic   Nustep L4 x15 min      Knee/Hip Exercises: Standing    Forward Step Up Both;1 set;15 reps;Hand Hold: 2;Step Height: 8"    Walking with Sports Cord forward and backward walking blue XTS x5 each      Knee/Hip Exercises: Supine   Bridges with Ball Squeeze AROM;Both;20 reps    Straight Leg Raise with External Rotation AAROM;Both;1 set;15 reps               Balance Exercises - 11/06/20 0001      Balance Exercises: Standing   Rockerboard Anterior/posterior;Lateral;Other time (comment);Intermittent UE support   x3 mins ant/post and lateral each   Gait with Head Turns Forward;3 reps    Sidestepping 2 reps                  PT Long Term Goals - 11/04/20 1459      PT LONG TERM GOAL #1   Title Patient will be independent with HEP and its progression    Time 6    Period Weeks    Status Achieved      PT LONG TERM GOAL #2   Title Patient will demonstrate 4/5  bilateral LE MMT to improve stability during functional tasks.    Time 6    Period Weeks    Status On-going      PT LONG TERM GOAL #3   Title Patient will demonstrate a 4+ point improvement on Berg Balance Scale to decrease risk of falls.    Period Weeks    Status Achieved      PT LONG TERM GOAL #4   Title Patient will perform modifed 5x sit to stand test in 18 seconds or less with no UE support to improve balance and improve functional LE strength.    Time 6    Period Weeks    Status Achieved                 Plan - 11/06/20 1647    Clinical Impression Statement Patient responded very well to therapy session with more standing balance activities performed. Patient was able to complete all exercises with close supervision and contact guard assist with good response and good use of ankle strategies to maintain balance. Patient with slight misstep with head turns but overall was able to recover without assistance.    Personal Factors and Comorbidities Comorbidity 3+    Comorbidities craniotomy 09/11/2020, HTN, osteoporosis, history of shoulder surgery     Examination-Activity Limitations Locomotion Level    Stability/Clinical Decision Making Stable/Uncomplicated    Clinical Decision Making Low    Rehab Potential Good    PT Frequency 2x / week    PT Duration 6 weeks    PT Treatment/Interventions ADLs/Self Care Home Management;Gait training;Stair training;Functional mobility training;Therapeutic activities;Therapeutic exercise;Balance training;Neuromuscular re-education;Manual techniques;Passive range of motion;Patient/family education    PT Next Visit Plan HEP and DC    Consulted and Agree with Plan of Care Patient  Patient will benefit from skilled therapeutic intervention in order to improve the following deficits and impairments:  Abnormal gait,Difficulty walking,Pain,Decreased balance,Decreased activity tolerance  Visit Diagnosis: Muscle weakness (generalized)  Unsteadiness on feet     Problem List Patient Active Problem List   Diagnosis Date Noted  . History of resection of meningioma 11/02/2020  . Peripheral edema   . Hypoalbuminemia due to protein-calorie malnutrition ([place])   . Transaminitis   . Tremor   . Mass of brain   . Seizure prophylaxis   . Iron deficiency anemia   . CKD (chronic kidney disease), stage III ([place]) 09/10/2020  . AMS (altered mental status) 09/04/2020  . Vasogenic Cerebral edema ([place]) 09/04/2020  . Meningioma ([place]) 09/04/2020  . Generalized weakness 09/04/2020  . Cancer of skin, squamous cell 09/04/2020  . Nephrolithiasis 09/04/2020  . UTI (urinary tract infection) 09/04/2020  . Essential hypertension 11/01/2015  . Osteoporosis 10/30/2015    Gabriela Eves, PT, DPT 11/06/2020, 5:07 PM  [facility] [address] [place], [place], [address] Phone: [phone]   Fax:  [phone]  Name: [name] Page MRN: [id number] Date of Birth: [birth year]

## 2020-11-08 ENCOUNTER — Other Ambulatory Visit: Payer: Self-pay | Admitting: Nurse Practitioner

## 2020-11-08 DIAGNOSIS — R251 Tremor, unspecified: Secondary | ICD-10-CM

## 2020-11-10 MED ORDER — PROPRANOLOL HCL 10 MG PO TABS: 15.0000 mg | ORAL_TABLET | Freq: Three times a day (TID) | ORAL | 0 refills | Status: DC

## 2020-11-10 NOTE — Telephone Encounter (Signed)
Denied RF for Propanolol 10 mg 1 TID Sent Rx dated 11/02/20 for 10 mg 1.5 TID, this Rx was on No Print, per OV not it was increased at visit

## 2020-11-11 ENCOUNTER — Ambulatory Visit: Payer: Medicare Other | Admitting: Physical Therapy

## 2020-11-11 ENCOUNTER — Encounter: Payer: Self-pay | Admitting: Physical Therapy

## 2020-11-11 ENCOUNTER — Other Ambulatory Visit: Payer: Self-pay

## 2020-11-11 DIAGNOSIS — M6281 Muscle weakness (generalized): Secondary | ICD-10-CM | POA: Diagnosis not present

## 2020-11-11 DIAGNOSIS — G9389 Other specified disorders of brain: Secondary | ICD-10-CM | POA: Diagnosis not present

## 2020-11-11 DIAGNOSIS — R2681 Unsteadiness on feet: Secondary | ICD-10-CM

## 2020-11-11 NOTE — Therapy (Addendum)
[facility] [place], [place], [address] Phone: [phone]   Fax:  [phone]  Physical Therapy Treatment PHYSICAL THERAPY DISCHARGE SUMMARY  Visits from Start of Care: 12  Current functional level related to goals / functional outcomes: See below   Remaining deficits: See goals   Education / Equipment: HEP Plan: Patient agrees to discharge.  Patient goals were met. Patient is being discharged due to meeting the stated rehab goals.  ?????      Patient Details  Name: [name] Page MRN: [id number] Date of Birth: 1951-04-06 Referring Provider (PT): Lauraine Rinne, PA-C   Encounter Date: 11/11/2020   PT End of Session - 11/11/20 1443    Visit Number 12    Number of Visits 12    Date for PT Re-Evaluation 11/19/20    Authorization Type Medicare (CQ and KX modifier) Progress note every 10th visit    PT Start Time 1433    PT Stop Time 1513    PT Time Calculation (min) 40 min    Activity Tolerance Patient tolerated treatment well    Behavior During Therapy [facility] for tasks assessed/performed           Past Medical History:  Diagnosis Date  . Arthritis   . Basal cell carcinoma   . 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 Cancer ([place])    melanoma  . Dysuria   . Frequency of urination   . Hematuria   . History of kidney stones   . Osteoporosis   . Renal calculus, bilateral   . 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 Urgency of urination   . Wears glasses     Past Surgical History:  Procedure Laterality Date  . APPLICATION OF CRANIAL NAVIGATION Right 09/11/2020   Procedure: APPLICATION OF CRANIAL NAVIGATION;  Surgeon: Vallarie Mare, MD;  Location: [facility];  Service: Neurosurgery;  Laterality: Right;  . CRANIOTOMY Right 09/11/2020   Procedure: Right Pterional Craniotomy for resection of anterior skull base mass with Brain Lab;  Surgeon: Vallarie Mare, MD;   Location: [place] [facility] [facility];  Service: Neurosurgery;  Laterality: Right;  . CYSTOSCOPY WITH RETROGRADE PYELOGRAM, URETEROSCOPY AND STENT PLACEMENT Bilateral 08/11/2013   Procedure: CYSTOSCOPY WITH RETROGRADE PYELOGRAM, URETEROSCOPY AND STENT PLACEMENT;  Surgeon: Alexis Frock, MD;  [facility];  Service: Urology;  Laterality: Bilateral;  . CYSTOSCOPY WITH URETEROSCOPY AND STENT PLACEMENT Bilateral 08/29/2013   Procedure: CYSTOSCOPY WITH BILATERAL URETEROSCOPY AND STENT REPLACEMENTS, stone extraction;  Surgeon: Alexis Frock, MD;  Location: [facility];  Service: Urology;  Laterality: Bilateral;  . CYSTOSCOPY/RETROGRADE/URETEROSCOPY/STONE EXTRACTION WITH BASKET Bilateral 01/31/2016   Procedure: CYSTOSCOPY/BILATERAL RETROGRADE PYELOGRAMS/BILATERAL URETEROSCOPY/BILATERAL STONE EXTRACTION WITH BASKET/RIGHT URETERAL STENT PLACEMENT;  Surgeon: Raynelle Bring, MD;  Location[facility];  Service: Urology;  Laterality: Bilateral;  . HEMIARTHROPLASTY SHOULDER FRACTURE Left 1992  . HOLMIUM LASER APPLICATION Bilateral 00/86/7619   Procedure: HOLMIUM LASER APPLICATION;  Surgeon: Alexis Frock, MD;  Location: [facility];  Service: Urology;  Laterality: Bilateral;    There were no vitals filed for this visit.   Subjective Assessment - 11/11/20 1443    Subjective COVID-19 screening performed upon arrival. No new complaints but feels much more independent.    Pertinent History craniotomy 09/11/2020, HTN, osteoporosis, history of shoulder surgery    Limitations House hold activities    Patient Stated Goals improve ability to do normal activities and walk without walker  Currently in Pain? No/denies              [facility] PT Assessment - 11/11/20 0001      Assessment   Medical Diagnosis Meningioma    Referring Provider (PT) Lauraine Rinne, PA-C    Next MD Visit 11/2020    Prior Therapy yes      Precautions   Precautions Fall      Restrictions   Weight Bearing Restrictions No       ROM / Strength   AROM / PROM / Strength Strength      Strength   Overall Strength Within functional limits for tasks performed    Strength Assessment Site Hip;Knee    Right/Left Hip Right;Left    Right Hip Flexion 4/5    Left Hip Flexion 4/5    Right/Left Knee Right;Left    Right Knee Flexion 4/5    Right Knee Extension 4+/5    Left Knee Flexion 4/5    Left Knee Extension 4+/5                         [facility] Adult PT Treatment/Exercise - 11/11/20 0001      Lumbar Exercises: Aerobic   Nustep --      Knee/Hip Exercises: Aerobic   Nustep L4 x15 min      Knee/Hip Exercises: Machines for Strengthening   Cybex Knee Extension 10# 2x10 reps    Cybex Knee Flexion 20# 2x10 reps      Knee/Hip Exercises: Standing   Heel Raises Both;20 reps    Heel Raises Limitations B toe raise x20 reps    Knee Flexion Strengthening;Both;2 sets;10 reps;Limitations    Knee Flexion Limitations 2#    Hip Flexion Stengthening;Both;2 sets;10 reps;Knee bent    Hip Flexion Limitations 2#    Hip Abduction Stengthening;Both;2 sets;10 reps;Knee straight;Limitations    Abduction Limitations 2#    Forward Step Up Both;20 reps;Hand Hold: 2;Step Height: 6"                       PT Long Term Goals - 11/04/20 1459      PT LONG TERM GOAL #1   Title Patient will be independent with HEP and its progression    Time 6    Period Weeks    Status Achieved      PT LONG TERM GOAL #2   Title Patient will demonstrate 4/5  bilateral LE MMT to improve stability during functional tasks.    Time 6    Period Weeks    Status On-going      PT LONG TERM GOAL #3   Title Patient will demonstrate a 4+ point improvement on Berg Balance Scale to decrease risk of falls.    Period Weeks    Status Achieved      PT LONG TERM GOAL #4   Title Patient will perform modifed 5x sit to stand test in 18 seconds or less with no UE support to improve balance and improve functional LE strength.    Time 6     Period Weeks    Status Achieved                 Plan - 11/11/20 1528    Clinical Impression Statement Patient presented in clinic with reports of more independence. Patient denies any activities that are difficult for her at this time. Patient able to achieve all LTGs goal set at evaluation as well.  Patient able to tolerate progression fairly well athough patient still fatigues fairly quickly. Patient encouraged to continue HEP as directed.    Personal Factors and Comorbidities Comorbidity 3+    Comorbidities craniotomy 09/11/2020, HTN, osteoporosis, history of shoulder surgery    Examination-Activity Limitations Locomotion Level    Stability/Clinical Decision Making Stable/Uncomplicated    Rehab Potential Good    PT Frequency 2x / week    PT Duration 6 weeks    PT Treatment/Interventions ADLs/Self Care Home Management;Gait training;Stair training;Functional mobility training;Therapeutic activities;Therapeutic exercise;Balance training;Neuromuscular re-education;Manual techniques;Passive range of motion;Patient/family education    PT Next Visit Plan D/C summary required.    PT Home Exercise Plan cont. HEP provided by inpatient PT    Consulted and Agree with Plan of Care Patient           Patient will benefit from skilled therapeutic intervention in order to improve the following deficits and impairments:  Abnormal gait,Difficulty walking,Pain,Decreased balance,Decreased activity tolerance  Visit Diagnosis: Muscle weakness (generalized)  Unsteadiness on feet     Problem List Patient Active Problem List   Diagnosis Date Noted  . History of resection of meningioma 11/02/2020  . Peripheral edema   . Hypoalbuminemia due to protein-calorie malnutrition ([place])   . Transaminitis   . Tremor   . Mass of brain   . Seizure prophylaxis   . Iron deficiency anemia   . CKD (chronic kidney disease), stage III ([place]) 09/10/2020  . AMS (altered mental status) 09/04/2020  .  Vasogenic Cerebral edema ([place]) 09/04/2020  . Meningioma ([place]) 09/04/2020  . Generalized weakness 09/04/2020  . Cancer of skin, squamous cell 09/04/2020  . Nephrolithiasis 09/04/2020  . UTI (urinary tract infection) 09/04/2020  . Essential hypertension 11/01/2015  . Osteoporosis 10/30/2015   Standley Brooking, PTA 11/11/20 3:54 PM   [facility] [address] [place], [place], [address] Phone: [phone]   Fax:  [phone]  Name: [name] Page MRN: [id number] Date of Birth: [birth year]

## 2020-11-19 ENCOUNTER — Ambulatory Visit: Payer: Medicare Other | Admitting: Physical Medicine and Rehabilitation

## 2020-11-26 ENCOUNTER — Encounter: Payer: Medicare Other | Admitting: Physical Medicine and Rehabilitation

## 2020-11-26 DIAGNOSIS — L570 Actinic keratosis: Secondary | ICD-10-CM | POA: Diagnosis not present

## 2020-11-26 DIAGNOSIS — C44519 Basal cell carcinoma of skin of other part of trunk: Secondary | ICD-10-CM | POA: Diagnosis not present

## 2020-11-27 ENCOUNTER — Other Ambulatory Visit: Payer: Self-pay | Admitting: Family Medicine

## 2020-11-27 DIAGNOSIS — R251 Tremor, unspecified: Secondary | ICD-10-CM

## 2020-11-27 DIAGNOSIS — E782 Mixed hyperlipidemia: Secondary | ICD-10-CM

## 2020-12-21 ENCOUNTER — Other Ambulatory Visit: Payer: Self-pay | Admitting: Family Medicine

## 2020-12-21 DIAGNOSIS — E782 Mixed hyperlipidemia: Secondary | ICD-10-CM

## 2020-12-21 DIAGNOSIS — R251 Tremor, unspecified: Secondary | ICD-10-CM

## 2020-12-25 DIAGNOSIS — C44519 Basal cell carcinoma of skin of other part of trunk: Secondary | ICD-10-CM | POA: Diagnosis not present

## 2020-12-27 ENCOUNTER — Other Ambulatory Visit: Payer: Self-pay | Admitting: Nurse Practitioner

## 2020-12-27 DIAGNOSIS — R251 Tremor, unspecified: Secondary | ICD-10-CM

## 2020-12-30 DIAGNOSIS — L570 Actinic keratosis: Secondary | ICD-10-CM | POA: Diagnosis not present

## 2020-12-30 DIAGNOSIS — C44619 Basal cell carcinoma of skin of left upper limb, including shoulder: Secondary | ICD-10-CM | POA: Diagnosis not present

## 2020-12-30 DIAGNOSIS — D485 Neoplasm of uncertain behavior of skin: Secondary | ICD-10-CM | POA: Diagnosis not present

## 2020-12-31 ENCOUNTER — Encounter: Payer: Self-pay | Admitting: Family Medicine

## 2020-12-31 ENCOUNTER — Other Ambulatory Visit: Payer: Self-pay

## 2020-12-31 ENCOUNTER — Ambulatory Visit (INDEPENDENT_AMBULATORY_CARE_PROVIDER_SITE_OTHER): Payer: Medicare Other | Admitting: Family Medicine

## 2020-12-31 VITALS — BP 101/55 | HR 55 | Temp 97.6°F | Ht 62.0 in | Wt 129.2 lb

## 2020-12-31 DIAGNOSIS — Z8603 Personal history of neoplasm of uncertain behavior: Secondary | ICD-10-CM

## 2020-12-31 DIAGNOSIS — R251 Tremor, unspecified: Secondary | ICD-10-CM

## 2020-12-31 DIAGNOSIS — Z298 Encounter for other specified prophylactic measures: Secondary | ICD-10-CM

## 2020-12-31 DIAGNOSIS — Z9889 Other specified postprocedural states: Secondary | ICD-10-CM

## 2020-12-31 DIAGNOSIS — D500 Iron deficiency anemia secondary to blood loss (chronic): Secondary | ICD-10-CM | POA: Diagnosis not present

## 2020-12-31 DIAGNOSIS — D519 Vitamin B12 deficiency anemia, unspecified: Secondary | ICD-10-CM | POA: Diagnosis not present

## 2020-12-31 LAB — HEMOGLOBIN, FINGERSTICK: Hemoglobin: 11 g/dL — ABNORMAL LOW (ref 11.1–15.9)

## 2020-12-31 MED ORDER — PROPRANOLOL HCL 10 MG PO TABS: 10.0000 mg | ORAL_TABLET | Freq: Three times a day (TID) | ORAL | 2 refills | Status: DC

## 2020-12-31 NOTE — Progress Notes (Signed)
Subjective: CC: Tremor PCP: Janora Norlander, DO [id number] [name] Page is a 70 y.o. female presenting to clinic today for:  1.  Tremor Patient with history of resected meningioma.  She had a left-sided tremor as a result.  She notes that she still has intermittent slowing of the left upper extremity and left lower extremity.  She is not seen a great deal of difference with the 15 mg of propranolol 3 times daily.  She continues to take Keppra as prophylaxis for seizures.  Next office visit with her neurosurgeon will be in June, at which point they plan on proceeding with repeat MRI.  2.  Preventive care Patient has yet to schedule her mammogram because she is getting some lesions off of her skin resected.  This involves the breast area.  Additionally, colonoscopy had been planned prior to her brain cancer but this is also been delayed.  She is on iron for iron deficiency anemia.  She suspects that the iron deficiency was related to the skin lesions as she denies any rectal bleeding.   ROS: Per HPI  Allergies  Allergen Reactions  . Sulfa Antibiotics Rash   Past Medical History:  Diagnosis Date  . Arthritis   . Basal cell carcinoma   . 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 Cancer ([place])    melanoma  . Dysuria   . Frequency of urination   . Hematuria   . History of kidney stones   . Osteoporosis   . Renal calculus, bilateral   . 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 Urgency of urination   . Wears glasses     Current Outpatient Medications:  .  atorvastatin (LIPITOR) 20 MG tablet, TAKE 1 TABLET BY MOUTH EVERY DAY, Disp: 30 tablet, Rfl: 2 .  Calcium Carbonate-Vit D-Min (CALCIUM 1200 PO), Take 1 tablet by mouth daily., Disp: , Rfl:  .  ERIVEDGE 150 MG capsule, Take by mouth., Disp: , Rfl:  .  escitalopram (LEXAPRO) 10 MG tablet, Take 1 tablet (10 mg total) by mouth daily., Disp: 90 tablet, Rfl: 1 .  ferrous sulfate 325 (65 FE) MG tablet,  Take 1 tablet (325 mg total) by mouth daily with breakfast., Disp: 30 tablet, Rfl: 3 .  Glycerin-Hypromellose-PEG 400 0.2-0.2-1 % SOLN, Apply 1 drop to eye 2 (two) times daily as needed (dry eyes). , Disp: , Rfl:  .  hydrALAZINE (APRESOLINE) 50 MG tablet, TAKE 1 TABLET BY MOUTH EVERY 8 HOURS, Disp: 90 tablet, Rfl: 2 .  levETIRAcetam (KEPPRA) 100 MG/ML solution, Take 5 mLs (500 mg total) by mouth 2 (two) times daily., Disp: 473 mL, Rfl: 12 .  loratadine (CLARITIN) 10 MG tablet, Take 10 mg by mouth daily as needed for allergies., Disp: , Rfl:  .  Multiple Vitamin (MULTIVITAMIN WITH MINERALS) TABS tablet, Take 1 tablet by mouth daily., Disp: , Rfl:  .  propranolol (INDERAL) 10 MG tablet, TAKE 1.5 TABLETS (15 MG TOTAL) BY MOUTH 3 (THREE) TIMES DAILY., Disp: 135 tablet, Rfl: 2 Social History   Socioeconomic History  . Marital status: Legally Separated    Spouse name: Not on file  . Number of children: 1  . Years of education: 85  . Highest education level: Not on file  Occupational History  . Not on file  Tobacco Use  . Smoking status: Never Smoker  . Smokeless tobacco: Never Used  Vaping Use  . Vaping Use: Never  used  Substance and Sexual Activity  . Alcohol use: No  . Drug use: No  . Sexual activity: Not Currently  Other Topics Concern  . Not on file  Social History Narrative   Patient resides locally.  She is currently separated from her spouse.  She is living in a mobile home near her daughter and 2 grandchildren and her father.   Social Determinants of Health   Financial Resource Strain: Not on file  Food Insecurity: Not on file  Transportation Needs: Not on file  Physical Activity: Not on file  Stress: Not on file  Social Connections: Not on file  Intimate Partner Violence: Not on file   Family History  Problem Relation Age of Onset  . Cancer Mother        unknown source, stomach and liver   . Atrial fibrillation Father   . Hyperlipidemia Father   . Hypertension Father    . Colon polyps Father   . Hypertension Sister   . Hyperlipidemia Sister   . Thyroid disease Sister   . Esophageal cancer Neg Hx   . Pancreatic cancer Neg Hx     Objective: Office vital signs reviewed. BP (!) 101/55   Pulse (!) 55   Temp 97.6 F (36.4 C)   Ht 5\' 2"  (1.575 m)   Wt 129 lb 3.2 oz (58.6 kg)   SpO2 96%   BMI 23.63 kg/m   Physical Examination:  General: Awake, alert, well nourished, No acute distress HEENT: Sclera white. Cardio: Slightly bradycardic and rhythm, S1S2 heard, no murmurs appreciated Pulm: clear to auscultation bilaterally, no wheezes, rhonchi or rales; normal work of breathing on room air Extremities: warm, well perfused, No edema, cyanosis or clubbing; +2 pulses bilaterally MSK: Ambulating independently Neuro: No tremor.  She is much quicker to respond than previous  Assessment/ Plan: 69 y.o. female   Tremor - Plan: propranolol (INDERAL) 10 MG tablet  Iron deficiency anemia due to chronic blood loss - Plan: Anemia Profile B, Hemoglobin, fingerstick  Seizure prophylaxis  History of resection of meningioma  Tremor is intermittent and did not really respond to increased dose of propranolol.  Given bradycardia recommended reducing back to 10 mg 3 times daily and following up with neurosurgery for further recommendations.  I did offer referral to neurology if needed as well.  Check hemoglobin and anemia profile.  Bleed was thought to be secondary to lesion on chest.  No GI bleeding  Remains on seizure prophylaxis.  Will await repeat MRI in June  We will reach out to her gastroenterologist with regards to consideration for Cologuard over colonoscopy but would like their input given history of meningioma.  To my knowledge she has had normal colonoscopies Orders Placed This Encounter  Procedures  . Anemia Profile B  . Hemoglobin, fingerstick   No orders of the defined types were placed in this encounter.    Janora Norlander, DO [place] [phone]

## 2020-12-31 NOTE — Patient Instructions (Signed)
Lower propranalol back to 10mg  3 times daily since no big difference and your pulse is low  Ask neurosurgery if any other alternatives  Will reach out to GI about colon cancer screening

## 2021-01-01 LAB — ANEMIA PROFILE B
Basophils Absolute: 0 10*3/uL (ref 0.0–0.2)
Basos: 1 %
EOS (ABSOLUTE): 0.4 10*3/uL (ref 0.0–0.4)
Eos: 8 %
Ferritin: 30 ng/mL (ref 15–150)
Folate: 7.2 ng/mL (ref >3.0)
Hematocrit: 35.6 % (ref 34.0–46.6)
Hemoglobin: 11.3 g/dL (ref 11.1–15.9)
Immature Grans (Abs): 0 10*3/uL (ref 0.0–0.1)
Immature Granulocytes: 0 %
Iron Saturation: 51 % (ref 15–55)
Iron: 111 ug/dL (ref 27–139)
Lymphocytes Absolute: 1.7 10*3/uL (ref 0.7–3.1)
Lymphs: 33 %
MCH: 26.8 pg (ref 26.6–33.0)
MCHC: 31.7 g/dL (ref 31.5–35.7)
MCV: 84 fL (ref 79–97)
Monocytes Absolute: 0.6 10*3/uL (ref 0.1–0.9)
Monocytes: 11 %
Neutrophils Absolute: 2.5 10*3/uL (ref 1.4–7.0)
Neutrophils: 47 %
Platelets: 195 10*3/uL (ref 150–450)
RBC: 4.22 x10E6/uL (ref 3.77–5.28)
RDW: 14.8 % (ref 11.7–15.4)
Retic Ct Pct: 0.8 % (ref 0.6–2.6)
Total Iron Binding Capacity: 217 ug/dL — ABNORMAL LOW (ref 250–450)
UIBC: 106 ug/dL — ABNORMAL LOW (ref 118–369)
Vitamin B-12: 326 pg/mL (ref 232–1245)
WBC: 5.2 10*3/uL (ref 3.4–10.8)

## 2021-01-02 ENCOUNTER — Other Ambulatory Visit: Payer: Self-pay

## 2021-01-05 ENCOUNTER — Ambulatory Visit (INDEPENDENT_AMBULATORY_CARE_PROVIDER_SITE_OTHER): Payer: Medicare Other

## 2021-01-05 VITALS — Ht 62.0 in | Wt 129.0 lb

## 2021-01-05 DIAGNOSIS — Z0000 Encounter for general adult medical examination without abnormal findings: Secondary | ICD-10-CM

## 2021-01-05 NOTE — Progress Notes (Signed)
Subjective:   [name] Page is a 70 y.o. female who presents for Medicare Annual (Subsequent) preventive examination.  Virtual Visit via Telephone Note  I connected with  [name] Page on 01/05/21 at  2:45 PM EDT by telephone and verified that I am speaking with the correct person using two identifiers.  Location: Patient: Home Provider: [facility] Persons participating in the virtual visit: patient/Nurse Health Advisor   I discussed the limitations, risks, security and privacy concerns of performing an evaluation and management service by telephone and the availability of in person appointments. The patient expressed understanding and agreed to proceed.  Interactive audio and video telecommunications were attempted between this nurse and patient, however failed, due to patient having technical difficulties OR patient did not have access to video capability.  We continued and completed visit with audio only.  Some vital signs may be absent or patient reported.   [name] Riggan E Debroh Sieloff, LPN   Review of Systems     Cardiac Risk Factors include: advanced age (>79men, >32 women);dyslipidemia     Objective:    Today's Vitals   01/05/21 1440  Weight: 129 lb (58.5 kg)  Height: 5\' 2"  (1.575 m)   Body mass index is 23.59 kg/m.  Advanced Directives 10/01/2020 09/17/2020 09/17/2020 09/11/2020 09/07/2020 09/11/2019 01/27/2019  Does Patient Have a Medical Advance Directive? Yes Yes Yes No Yes No No  Type of Advance Directive - Living will Living will;Healthcare Power of Attorney Living will Living will - -  Does patient want to make changes to medical advance directive? - No - Patient declined - No - Patient declined No - Patient declined - -  Copy of [place] in Chart? - - No - copy requested - - - -  Would patient like information on creating a medical advance directive? - - - - - Yes (MAU/Ambulatory/Procedural Areas - Information given) -  Pre-existing out of facility DNR  order (yellow form or pink MOST form) - - - - - - -    Current Medications (verified) Outpatient Encounter Medications as of 01/05/2021  Medication Sig  . atorvastatin (LIPITOR) 20 MG tablet TAKE 1 TABLET BY MOUTH EVERY DAY  . Calcium Carbonate-Vit D-Min (CALCIUM 1200 PO) Take 1 tablet by mouth daily.  Marland Kitchen ERIVEDGE 150 MG capsule Take by mouth.  . escitalopram (LEXAPRO) 10 MG tablet Take 1 tablet (10 mg total) by mouth daily.  . ferrous sulfate 325 (65 FE) MG tablet Take 1 tablet (325 mg total) by mouth daily with breakfast.  . fluticasone (CUTIVATE) 0.005 % ointment Apply topically.  . Glycerin-Hypromellose-PEG 400 0.2-0.2-1 % SOLN Apply 1 drop to eye 2 (two) times daily as needed (dry eyes).   . hydrALAZINE (APRESOLINE) 50 MG tablet TAKE 1 TABLET BY MOUTH EVERY 8 HOURS  . levETIRAcetam (KEPPRA) 100 MG/ML solution Take 5 mLs (500 mg total) by mouth 2 (two) times daily.  Marland Kitchen loratadine (CLARITIN) 10 MG tablet Take 10 mg by mouth daily as needed for allergies.  . Multiple Vitamin (MULTIVITAMIN WITH MINERALS) TABS tablet Take 1 tablet by mouth daily.  . propranolol (INDERAL) 10 MG tablet Take 1 tablet (10 mg total) by mouth 3 (three) times daily.  . hydrocortisone 2.5 % ointment SMARTSIG:Sparingly Topical Twice Daily (Patient not taking: Reported on 01/05/2021)  . ketoconazole (NIZORAL) 2 % cream SMARTSIG:1 Application Topical 1 to 2 Times Daily (Patient not taking: Reported on 01/05/2021)   No facility-administered encounter medications on file as of 01/05/2021.  Allergies (verified) Sulfa antibiotics   History: Past Medical History:  Diagnosis Date  . Arthritis   . Basal cell carcinoma   . 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 Cancer ([place])    melanoma  . Dysuria   . Frequency of urination   . Hematuria   . History of kidney stones   . Osteoporosis   . Renal calculus, bilateral   . 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 Urgency of urination    . Wears glasses    Past Surgical History:  Procedure Laterality Date  . APPLICATION OF CRANIAL NAVIGATION Right 09/11/2020   Procedure: APPLICATION OF CRANIAL NAVIGATION;  Surgeon: Vallarie Mare, MD;  Location: [facility];  Service: Neurosurgery;  Laterality: Right;  . CRANIOTOMY Right 09/11/2020   Procedure: Right Pterional Craniotomy for resection of anterior skull base mass with Brain Lab;  Surgeon: Vallarie Mare, MD;  Location: [facility];  Service: Neurosurgery;  Laterality: Right;  . CYSTOSCOPY WITH RETROGRADE PYELOGRAM, URETEROSCOPY AND STENT PLACEMENT Bilateral 08/11/2013   Procedure: CYSTOSCOPY WITH RETROGRADE PYELOGRAM, URETEROSCOPY AND STENT PLACEMENT;  Surgeon: Alexis Frock, MD;  Location: [facility];  Service: Urology;  Laterality: Bilateral;  . CYSTOSCOPY WITH URETEROSCOPY AND STENT PLACEMENT Bilateral 08/29/2013   Procedure: CYSTOSCOPY WITH BILATERAL URETEROSCOPY AND STENT REPLACEMENTS, stone extraction;  Surgeon: Alexis Frock, MD;  Location: [facility];  Service: Urology;  Laterality: Bilateral;  . CYSTOSCOPY/RETROGRADE/URETEROSCOPY/STONE EXTRACTION WITH BASKET Bilateral 01/31/2016   Procedure: CYSTOSCOPY/BILATERAL RETROGRADE PYELOGRAMS/BILATERAL URETEROSCOPY/BILATERAL STONE EXTRACTION WITH BASKET/RIGHT URETERAL STENT PLACEMENT;  Surgeon: Raynelle Bring, MD;  Location: [facility];  Service: Urology;  Laterality: Bilateral;  . HEMIARTHROPLASTY SHOULDER FRACTURE Left 1992  . HOLMIUM LASER APPLICATION Bilateral 09/22/7251   Procedure: HOLMIUM LASER APPLICATION;  Surgeon: Alexis Frock, MD;  Location[facility];  Service: Urology;  Laterality: Bilateral;   Family History  Problem Relation Age of Onset  . Cancer Mother        unknown source, stomach and liver   . Atrial fibrillation Father   . Hyperlipidemia Father   . Hypertension Father   . Colon polyps Father   . Hypertension Sister   . Hyperlipidemia Sister   . Thyroid disease Sister   .  Esophageal cancer Neg Hx   . Pancreatic cancer Neg Hx    Social History   Socioeconomic History  . Marital status: Legally Separated    Spouse name: Not on file  . Number of children: 1  . Years of education: 66  . Highest education level: Not on file  Occupational History  . Occupation: retired  Tobacco Use  . Smoking status: Never Smoker  . Smokeless tobacco: Never Used  Vaping Use  . Vaping Use: Never used  Substance and Sexual Activity  . Alcohol use: No  . Drug use: No  . Sexual activity: Not Currently  Other Topics Concern  . Not on file  Social History Narrative   Patient resides locally.  She is currently separated from her spouse.  She is living in a mobile home near her daughter and 2 grandchildren and her father.   Social Determinants of Health   Financial Resource Strain: Low Risk   . Difficulty of Paying Living Expenses: Not hard at all  Food Insecurity: No Food Insecurity  . Worried About Charity fundraiser in the Last Year: Never true  . Ran Out of Food in the Last Year:  Never true  Transportation Needs: No Transportation Needs  . Lack of Transportation (Medical): No  . Lack of Transportation (Non-Medical): No  Physical Activity: Sufficiently Active  . Days of Exercise per Week: 7 days  . Minutes of Exercise per Session: 30 min  Stress: No Stress Concern Present  . Feeling of Stress : Not at all  Social Connections: Moderately Integrated  . Frequency of Communication with Friends and Family: More than three times a week  . Frequency of Social Gatherings with Friends and Family: More than three times a week  . Attends Religious Services: More than 4 times per year  . Active Member of Clubs or Organizations: Yes  . Attends Archivist Meetings: More than 4 times per year  . Marital Status: Separated    Tobacco Counseling Counseling given: Not Answered   Clinical Intake:  Pre-visit preparation completed: Yes  Pain : No/denies pain      BMI - recorded: 23.59 Nutritional Risks: None Diabetes: No  How often do you need to have someone help you when you read instructions, pamphlets, or other written materials from your doctor or pharmacy?: 1 - Never  Diabetic? no  Interpreter Needed?: No  Information entered by :: Naydelin Ziegler, LPN   Activities of Daily Living In your present state of health, do you have any difficulty performing the following activities: 01/05/2021 09/17/2020  Hearing? Y N  Vision? N N  Difficulty concentrating or making decisions? Y N  Walking or climbing stairs? N Y  Dressing or bathing? N Y  Doing errands, shopping? N Y  Conservation officer, nature and eating ? N -  Using the Toilet? N -  In the past six months, have you accidently leaked urine? N -  Do you have problems with loss of bowel control? N -  Managing your Medications? N -  Managing your Finances? N -  Housekeeping or managing your Housekeeping? N -  Some recent data might be hidden    Patient Care Team: Janora Norlander, DO as PCP - General (Family Medicine) Alexis Frock, MD as Consulting Physician (Urology)  Indicate any recent Medical Services you may have received from other than [facility] providers in the past year (date may be approximate).     Assessment:   This is a routine wellness examination for [name] Page.  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 Increase physical activity    . LIFESTYLE - DECREASE FALLS RISK      Depression Screen PHQ 2/9 Scores 01/05/2021 12/31/2020 10/31/2020 10/15/2020 08/12/2020 07/31/2020 07/18/2020  PHQ - 2 Score 0 0 0 0 1 4 5   PHQ- 9 Score - - 0 2 10 7 15   Exception Documentation - - - - Medical reason - -    Fall Risk Fall Risk  01/05/2021 12/31/2020 10/31/2020 10/15/2020 08/12/2020  Falls in the past  year? 0 0 0 1 0  Number falls in past yr: 0 - - 0 -  Injury with Fall? 0 - - 0 -  Risk for fall due to : Medication side effect - - - -  Follow up - - - - -    [place]:  Any stairs in or around the home? Yes  If so, are there any without handrails? No  Home free of loose throw rugs in walkways, pet beds, electrical cords, etc? Yes  Adequate lighting in your home to reduce risk of falls? Yes   ASSISTIVE DEVICES UTILIZED TO PREVENT FALLS:  Life alert? No  Use of a cane, walker or w/c? No  Grab bars in the bathroom? Yes  Shower chair or bench in shower? Yes  Elevated toilet seat or a handicapped toilet? Yes   TIMED UP AND GO:  Was the test performed? No . Telephonic visit  Cognitive Function: Normal cognitive status assessed by direct observation by this Nurse Health Advisor. No abnormalities found. She declined memory test today, says her brain surgeon told her memory should be back to normal in the next 6 months.  MMSE - Mini Mental State Exam 08/23/2018  Orientation to time 5  Orientation to Place 5  Registration 3  Attention/ Calculation 5  Recall 2  Language- name 2 objects 2  Language- repeat 1  Language- follow 3 step command 3  Language- read & follow direction 1  Write a sentence 1  Copy design 1  Total score 29     6CIT Screen 09/11/2019  What Year? 0 points  What month? 0 points  What time? 0 points  Count back from 20 0 points  Months in reverse 0 points  Repeat phrase 0 points  Total Score 0    Immunizations Immunization History   Administered Date(s) Administered  . Fluad Quad(high Dose 65+) 06/27/2019, 07/18/2020  . Hepatitis B 04/21/1994  . Influenza, High Dose Seasonal PF 08/08/2015, 07/07/2018  . Influenza,inj,Quad PF,6+ Mos 08/11/2013  . Influenza-Unspecified 06/05/2014  . Moderna SARS-COV2 Booster Vaccination 09/16/2020  . Moderna Sars-Covid-2 Vaccination 10/13/2019, 11/15/2019  . Pneumococcal Conjugate-13 10/30/2015  . Pneumococcal Polysaccharide-23 08/14/2018    TDAP status: Up to date  Flu Vaccine status: Up to date  Pneumococcal vaccine status: Up to date  Covid-19 vaccine status: Completed vaccines  Qualifies for Shingles Vaccine? Yes   Zostavax completed Yes   Shingrix Completed?: No.    Education has been provided regarding the importance of this vaccine. Patient has been advised to call insurance company to determine out of pocket expense if they have not yet received this vaccine. Advised may also receive vaccine at local pharmacy or Health Dept. Verbalized acceptance and understanding.  Screening Tests Health Maintenance  Topic Date Due  . COLONOSCOPY (Pts 45-83yrs Insurance coverage will need to be confirmed)  09/21/2019  . Hepatitis C Screening  08/12/2021 (Originally [birth year])  . MAMMOGRAM  08/21/2021 (Originally 11/08/2020)  . TETANUS/TDAP  12/31/2021 (Originally 10/22/2019)  . COVID-19 Vaccine (4 - Booster for Moderna series) 03/17/2021  . INFLUENZA VACCINE  04/20/2021  . DEXA SCAN  10/09/2021  . PNA vac Low Risk Adult  Completed  . HPV VACCINES  Aged Out    Health Maintenance  Health Maintenance Due  Topic Date Due  . COLONOSCOPY (Pts 45-3yrs Insurance coverage will need to be confirmed)  09/21/2019    Colorectal cancer screening: Type of screening: Colonoscopy. Completed 2011. Repeat every 10  years  Mammogram status: Completed 11/08/2018. Repeat every year  Bone Density status: Completed 10/10/2019. Results reflect: Bone density results: OSTEOPOROSIS. Repeat every 2  years.  Lung Cancer Screening: (Low Dose CT Chest recommended if Age 17-80 years, 30 pack-year currently smoking OR have quit w/in 15years.) does not qualify.   Additional Screening:  Hepatitis C Screening: does qualify; needs this drawn with next routine labs  Vision Screening: Recommended annual ophthalmology exams for early detection of glaucoma and other disorders of the eye. Is the patient up to date with their annual eye exam?  No  Who is the provider or what is the name of the office in which the patient attends annual eye exams? Dr Marin Comment in [place] If pt is not established with a provider, would they like to be referred to a provider to establish care? No .   Dental Screening: Recommended annual dental exams for proper oral hygiene  Community Resource Referral / Chronic Care Management: CRR required this visit?  No   CCM required this visit?  No      Plan:     I have personally reviewed and noted the following in the patient's chart:   . Medical and social history . Use of alcohol, tobacco or illicit drugs  . Current medications and supplements . Functional ability and status . Nutritional status . Physical activity . Advanced directives . List of other physicians . Hospitalizations, surgeries, and ER visits in previous 12 months . Vitals . Screenings to include cognitive, depression, and falls . Referrals and appointments  In addition, I have reviewed and discussed with patient certain preventive protocols, quality metrics, and best practice recommendations. A written personalized care plan for preventive services as well as general preventive health recommendations were provided to patient.     Sandrea Hammond, LPN   12/27/8117   Nurse Notes: She hasn't been released from surgeon since having brain tumor removed - after she's released, she will schedule mammogram and possibly colonoscopy. Also needs Tdap and Shingrix and wants to wait on these. Needs Hep C screening  at next visit.

## 2021-01-05 NOTE — Patient Instructions (Signed)
[name] , Thank you for taking time to come for your Medicare Wellness Visit. I appreciate your ongoing commitment to your health goals. Please review the following plan we discussed and let me know if I can assist you in the future.   Screening recommendations/referrals: Colonoscopy: Done 2011- Due for repeat* Mammogram: Done 11/08/2018 - Due for repeat* Bone Density: Done 10/10/2019 - Repeat every 2 years Recommended yearly ophthalmology/optometry visit for glaucoma screening and checkup Recommended yearly dental visit for hygiene and checkup  Vaccinations: Influenza vaccine: Done 07/15/2020 - Repeat annually Pneumococcal vaccine: Done 10/30/2015 & 08/14/2018 Tdap vaccine: Due Shingles vaccine: Shingrix discussed. Please contact your pharmacy for coverage information.    Covid-19: Done 10/13/19, 11/15/19, & 12/282021  Conditions/risks identified: Continue 30 minutes of physical activity daily, eat plenty of fruits and vegetables and drink 6-8 glasses of water each day.  Next appointment: Follow up in one year for your annual wellness visit    Preventive Care 65 Years and Older, Female Preventive care refers to lifestyle choices and visits with your health care provider that can promote health and wellness. What does preventive care include?  A yearly physical exam. This is also called an annual well check.  Dental exams once or twice a year.  Routine eye exams. Ask your health care provider how often you should have your eyes checked.  Personal lifestyle choices, including:  Daily care of your teeth and gums.  Regular physical activity.  Eating a healthy diet.  Avoiding tobacco and drug use.  Limiting alcohol use.  Practicing safe sex.  Taking low-dose aspirin every day.  Taking vitamin and mineral supplements as recommended by your health care provider. What happens during an annual well check? The services and screenings done by your health care provider during your  annual well check will depend on your age, overall health, lifestyle risk factors, and family history of disease. Counseling  Your health care provider may ask you questions about your:  Alcohol use.  Tobacco use.  Drug use.  Emotional well-being.  Home and relationship well-being.  Sexual activity.  Eating habits.  History of falls.  Memory and ability to understand (cognition).  Work and work Statistician.  Reproductive health. Screening  You may have the following tests or measurements:  Height, weight, and BMI.  Blood pressure.  Lipid and cholesterol levels. These may be checked every 5 years, or more frequently if you are over 60 years old.  Skin check.  Lung cancer screening. You may have this screening every year starting at age 50 if you have a 30-pack-year history of smoking and currently smoke or have quit within the past 15 years.  Fecal occult blood test (FOBT) of the stool. You may have this test every year starting at age 100.  Flexible sigmoidoscopy or colonoscopy. You may have a sigmoidoscopy every 5 years or a colonoscopy every 10 years starting at age 30.  Hepatitis C blood test.  Hepatitis B blood test.  Sexually transmitted disease (STD) testing.  Diabetes screening. This is done by checking your blood sugar (glucose) after you have not eaten for a while (fasting). You may have this done every 1-3 years.  Bone density scan. This is done to screen for osteoporosis. You may have this done starting at age 84.  Mammogram. This may be done every 1-2 years. Talk to your health care provider about how often you should have regular mammograms. Talk with your health care provider about your test results, treatment options,  and if necessary, the need for more tests. Vaccines  Your health care provider may recommend certain vaccines, such as:  Influenza vaccine. This is recommended every year.  Tetanus, diphtheria, and acellular pertussis (Tdap, Td)  vaccine. You may need a Td booster every 10 years.  Zoster vaccine. You may need this after age 55.  Pneumococcal 13-valent conjugate (PCV13) vaccine. One dose is recommended after age 36.  Pneumococcal polysaccharide (PPSV23) vaccine. One dose is recommended after age 8. Talk to your health care provider about which screenings and vaccines you need and how often you need them. This information is not intended to replace advice given to you by your health care provider. Make sure you discuss any questions you have with your health care provider. Document Released: 10/03/2015 Document Revised: 05/26/2016 Document Reviewed: 07/08/2015 Elsevier Interactive Patient Education  2017 [employer] Prevention in the Home Falls can cause injuries. They can happen to people of all ages. There are many things you can do to make your home safe and to help prevent falls. What can I do on the outside of my home?  Regularly fix the edges of walkways and driveways and fix any cracks.  Remove anything that might make you trip as you walk through a door, such as a raised step or threshold.  Trim any bushes or trees on the path to your home.  Use bright outdoor lighting.  Clear any walking paths of anything that might make someone trip, such as rocks or tools.  Regularly check to see if handrails are loose or broken. Make sure that both sides of any steps have handrails.  Any raised decks and porches should have guardrails on the edges.  Have any leaves, snow, or ice cleared regularly.  Use sand or salt on walking paths during winter.  Clean up any spills in your garage right away. This includes oil or grease spills. What can I do in the bathroom?  Use night lights.  Install grab bars by the toilet and in the tub and shower. Do not use towel bars as grab bars.  Use non-skid mats or decals in the tub or shower.  If you need to sit down in the shower, use a plastic, non-slip  stool.  Keep the floor dry. Clean up any water that spills on the floor as soon as it happens.  Remove soap buildup in the tub or shower regularly.  Attach bath mats securely with double-sided non-slip rug tape.  Do not have throw rugs and other things on the floor that can make you trip. What can I do in the bedroom?  Use night lights.  Make sure that you have a light by your bed that is easy to reach.  Do not use any sheets or blankets that are too big for your bed. They should not hang down onto the floor.  Have a firm chair that has side arms. You can use this for support while you get dressed.  Do not have throw rugs and other things on the floor that can make you trip. What can I do in the kitchen?  Clean up any spills right away.  Avoid walking on wet floors.  Keep items that you use a lot in easy-to-reach places.  If you need to reach something above you, use a strong step stool that has a grab bar.  Keep electrical cords out of the way.  Do not use floor polish or wax that makes floors slippery. If  you must use wax, use non-skid floor wax.  Do not have throw rugs and other things on the floor that can make you trip. What can I do with my stairs?  Do not leave any items on the stairs.  Make sure that there are handrails on both sides of the stairs and use them. Fix handrails that are broken or loose. Make sure that handrails are as long as the stairways.  Check any carpeting to make sure that it is firmly attached to the stairs. Fix any carpet that is loose or worn.  Avoid having throw rugs at the top or bottom of the stairs. If you do have throw rugs, attach them to the floor with carpet tape.  Make sure that you have a light switch at the top of the stairs and the bottom of the stairs. If you do not have them, ask someone to add them for you. What else can I do to help prevent falls?  Wear shoes that:  Do not have high heels.  Have rubber bottoms.  Are  comfortable and fit you well.  Are closed at the toe. Do not wear sandals.  If you use a stepladder:  Make sure that it is fully opened. Do not climb a closed stepladder.  Make sure that both sides of the stepladder are locked into place.  Ask someone to hold it for you, if possible.  Clearly mark and make sure that you can see:  Any grab bars or handrails.  First and last steps.  Where the edge of each step is.  Use tools that help you move around (mobility aids) if they are needed. These include:  Canes.  Walkers.  Scooters.  Crutches.  Turn on the lights when you go into a dark area. Replace any light bulbs as soon as they burn out.  Set up your furniture so you have a clear path. Avoid moving your furniture around.  If any of your floors are uneven, fix them.  If there are any pets around you, be aware of where they are.  Review your medicines with your doctor. Some medicines can make you feel dizzy. This can increase your chance of falling. Ask your doctor what other things that you can do to help prevent falls. This information is not intended to replace advice given to you by your health care provider. Make sure you discuss any questions you have with your health care provider. Document Released: 07/03/2009 Document Revised: 02/12/2016 Document Reviewed: 10/11/2014 Elsevier Interactive Patient Education  2017 [employer].

## 2021-01-07 NOTE — Progress Notes (Signed)
Patient aware of recommendation.  

## 2021-01-08 ENCOUNTER — Other Ambulatory Visit: Payer: Self-pay | Admitting: Neurosurgery

## 2021-01-08 DIAGNOSIS — D329 Benign neoplasm of meninges, unspecified: Secondary | ICD-10-CM

## 2021-02-05 DIAGNOSIS — C44519 Basal cell carcinoma of skin of other part of trunk: Secondary | ICD-10-CM | POA: Diagnosis not present

## 2021-02-05 DIAGNOSIS — L570 Actinic keratosis: Secondary | ICD-10-CM | POA: Diagnosis not present

## 2021-02-19 ENCOUNTER — Ambulatory Visit
Admission: RE | Admit: 2021-02-19 | Discharge: 2021-02-19 | Disposition: A | Discharge status: Home / Self Care | Payer: Medicare Other | Source: Ambulatory Visit | Attending: Neurosurgery | Admitting: Neurosurgery

## 2021-02-19 ENCOUNTER — Other Ambulatory Visit: Payer: Self-pay

## 2021-02-19 DIAGNOSIS — D329 Benign neoplasm of meninges, unspecified: Secondary | ICD-10-CM

## 2021-02-19 DIAGNOSIS — M6250 Muscle wasting and atrophy, not elsewhere classified, unspecified site: Secondary | ICD-10-CM | POA: Diagnosis not present

## 2021-02-19 IMAGING — MR MR HEAD WO/W CM
13 series · 48 of 48 positions shown · IV contrast (multihance)
Comparison: MRI head [DATE]

CLINICAL DATA: History of meningioma resection.

EXAM:
MRI HEAD WITHOUT AND WITH CONTRAST
TECHNIQUE: Multiplanar, multiecho pulse sequences of the brain and surrounding
structures were obtained without and with intravenous contrast.
CONTRAST:  11mL MULTIHANCE GADOBENATE DIMEGLUMINE 529 MG/ML IV SOLN

[Series 2: T1 · sagittal · 5.0mm · 0.45mm/px · 2 of 25 slices shown]
[im 1/25]
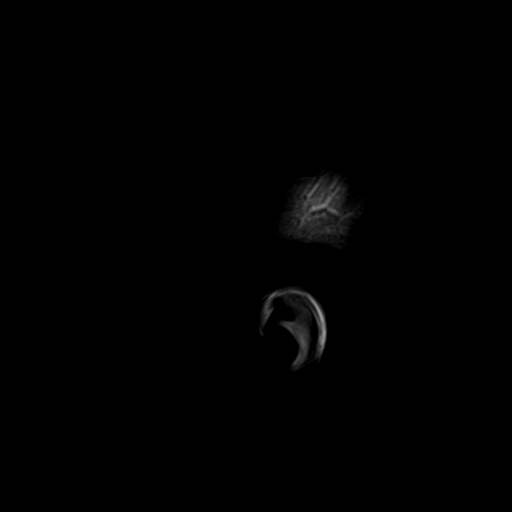
[im 25/25]
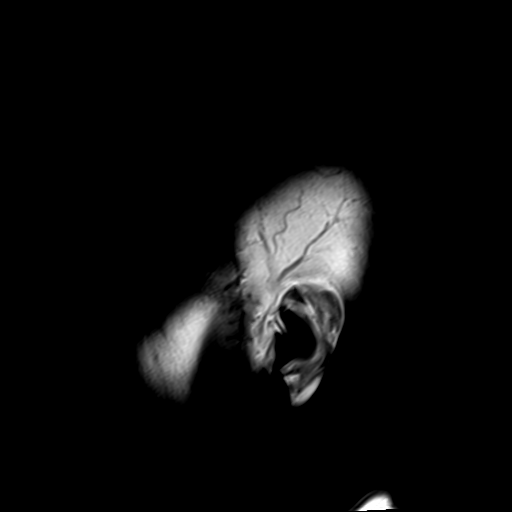

[Series 3: ax ep2d_diff_3 · axial · 3.0mm · 1.80mm/px · z∈[-51,+111]mm · 6 of 106 slices shown]
[im 1/106]
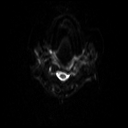
[im 22/106]
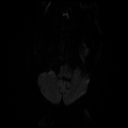
[im 43/106]
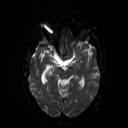
[im 64/106]
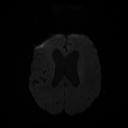
[im 85/106]
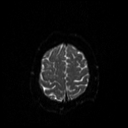
[im 106/106]
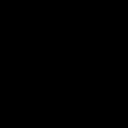

[Series 4: ax ep2d_diff_3_adc · axial · 3.0mm · 1.80mm/px · z∈[-51,+111]mm · 3 of 55 slices shown]
[im 1/55]
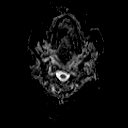
[im 28/55]
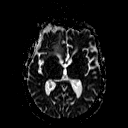
[im 55/55]
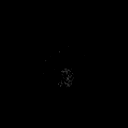

[Series 5: cor ep2d_diff · coronal · 5.0mm · 1.77mm/px · 3 of 59 slices shown]
[im 1/59]
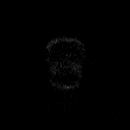
[im 30/59]
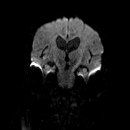
[im 59/59]
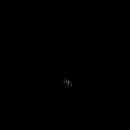

[Series 6: cor ep2d_diff_adc · coronal · 5.0mm · 1.77mm/px · 2 of 30 slices shown]
[im 1/30]
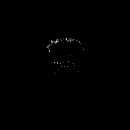
[im 30/30]
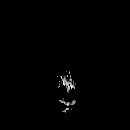

[Series 8: swi_images · axial · 2.0mm · 0.98mm/px · z∈[-49,+109]mm · 5 of 80 slices shown]
[im 1/80]
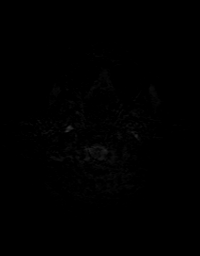
[im 20/80]
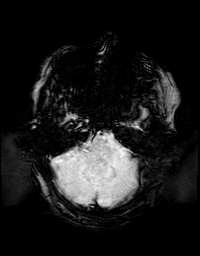
[im 40/80]
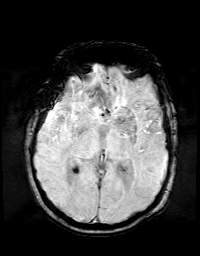
[im 60/80]
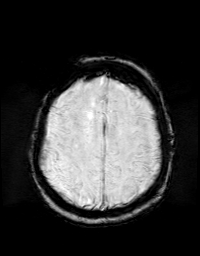
[im 80/80]
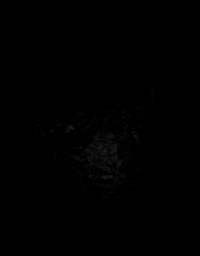

[Series 9: FLAIR · axial · 3.0mm · 0.43mm/px · z∈[-47,+105]mm · 2 of 40 slices shown]
[im 1/40]
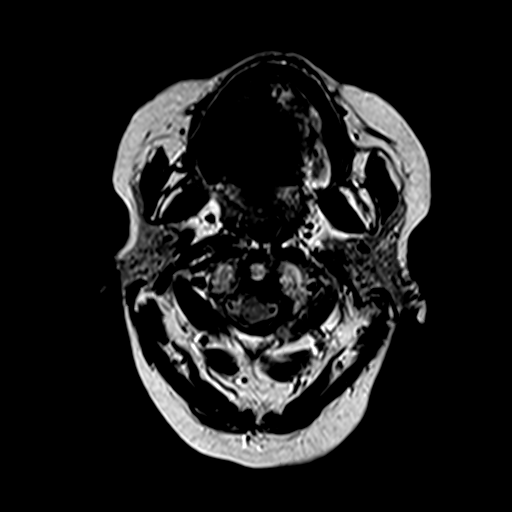
[im 40/40]
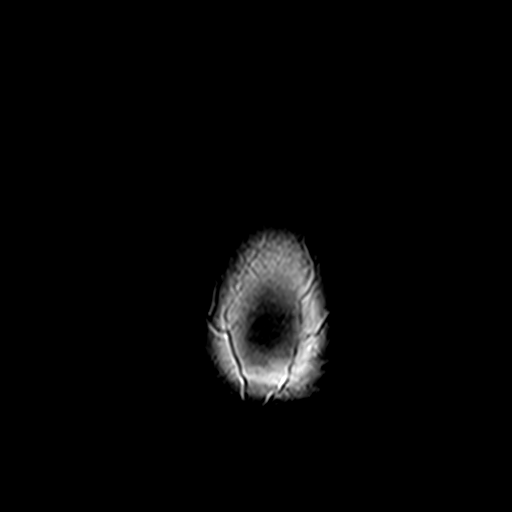

[Series 10: T2 · axial · 5.0mm · 0.65mm/px · z∈[-54,+114]mm · 2 of 29 slices shown (1 of 2)]
[im 1/29]
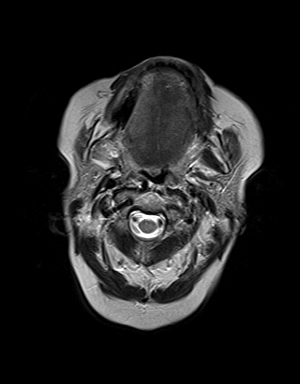
[im 29/29]
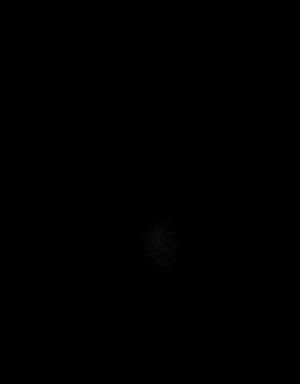

[Series 11: t1_mpr_tra · axial · 1.0mm · 0.72mm/px · z∈[-50,+109]mm · 9 of 160 slices shown]
[im 1/160]
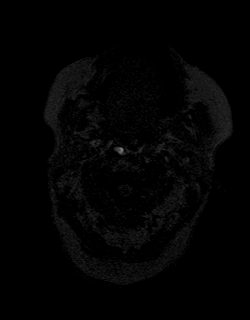
[im 20/160]
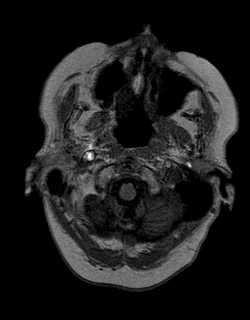
[im 40/160]
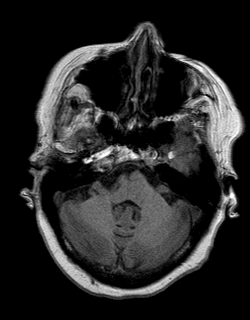
[im 60/160]
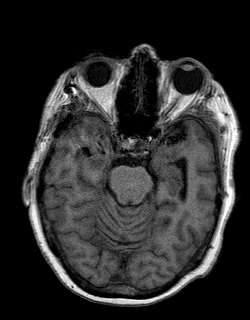
[im 80/160]
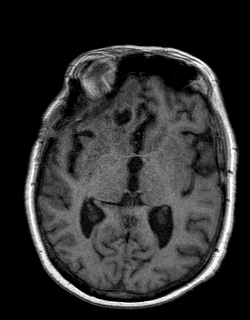
[im 100/160]
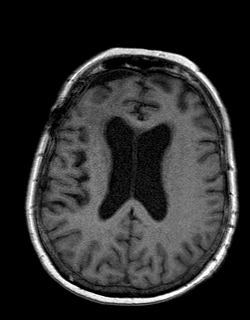
[im 120/160]
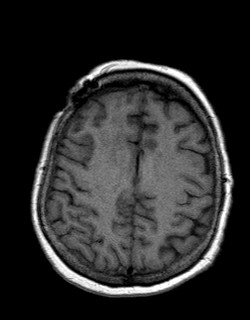
[im 140/160]
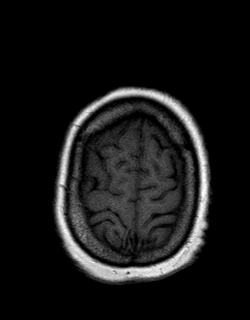
[im 160/160]
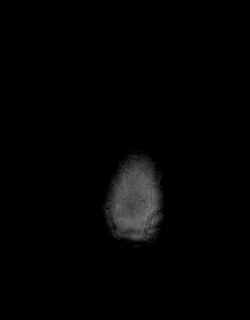

[Series 12: T2 · coronal · 5.0mm · 0.45mm/px · 2 of 31 slices shown (2 of 2)]
[im 1/31]
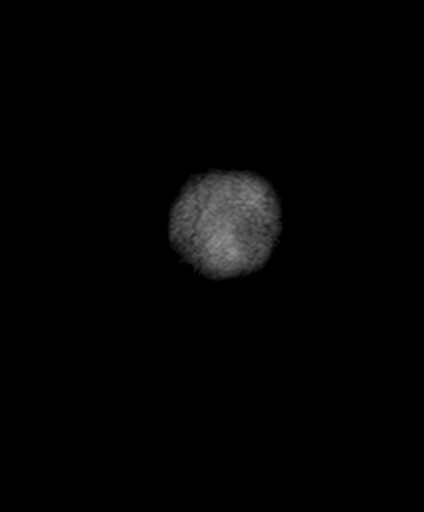
[im 31/31]
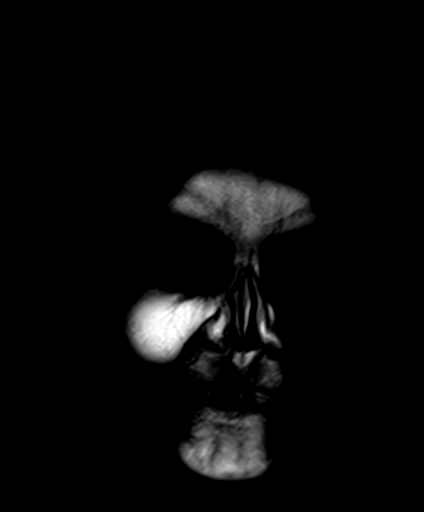

[Series 13: T1 post-contrast · coronal · 5.0mm · 0.72mm/px · 2 of 26 slices shown]
[im 1/26]
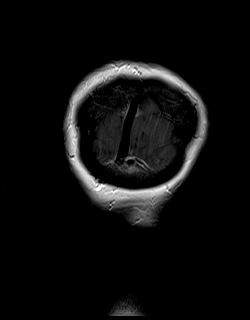
[im 26/26]
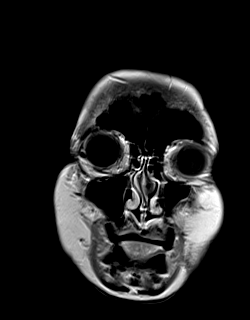

[Series 14: post t1_mpr_tra · axial · 1.0mm · 0.72mm/px · z∈[-59,+100]mm · 9 of 160 slices shown]
[im 1/160]
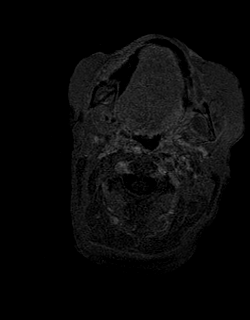
[im 20/160]
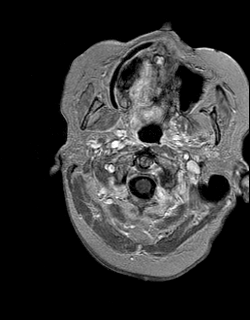
[im 40/160]
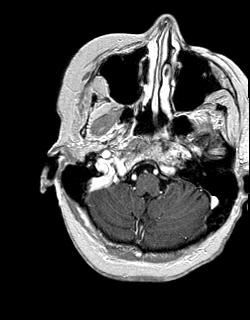
[im 60/160]
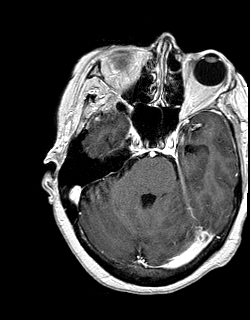
[im 80/160]
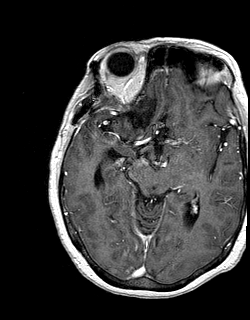
[im 100/160]
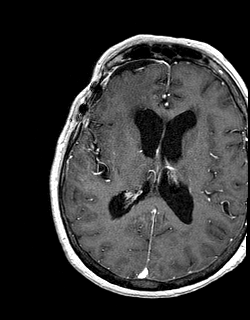
[im 120/160]
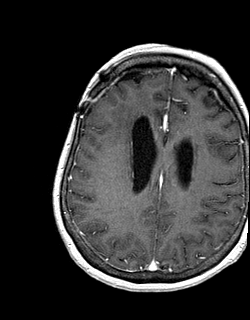
[im 140/160]
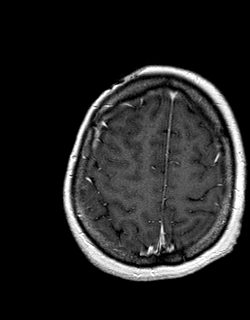
[im 160/160]
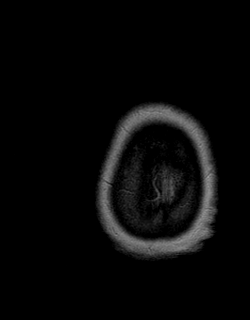

[Series 15: t1_se_sag post · sagittal · 5.0mm · 0.45mm/px · 1 of 25 slices shown]
[im 1/25]
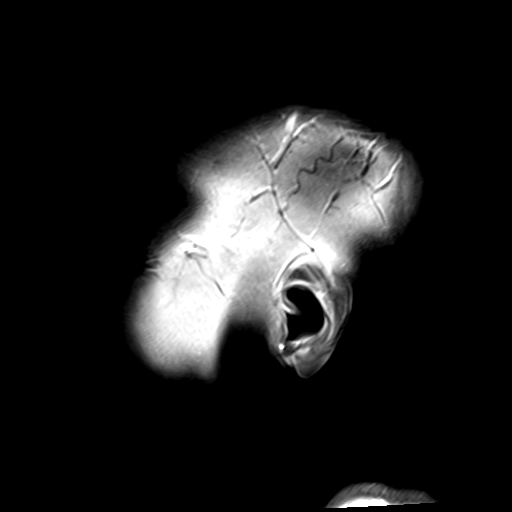

[48 of 48 positions shown; findings below may reference images not displayed]

FINDINGS: Brain: Right frontal craniotomy for resection of sphenoid wing
meningioma. No recurrent or residual enhancing mass is identified.
Postsurgical encephalomalacia right inferior frontal lobe. Marked
improvement in edema in the right frontal lobe. There remains a mild
amount of white matter hyperintensity in the right frontal white
matter. 7 mm right frontal subdural fluid collection slightly
improved from the prior study. No significant blood products at the
surgical site. Mild dural thickening enhancement in the right
frontal lobe due to recent surgery.

Negative for acute infarct. Generalized atrophy. Negative for
hydrocephalus.

Vascular: Normal arterial flow voids

Skull and upper cervical spine: Right frontal craniotomy

Sinuses/Orbits: Paranasal sinuses clear.  Normal orbit

Other: None
IMPRESSION: Postop resection of sphenoid wing meningioma on the right. No
recurrent or residual tumor identified. Postop encephalomalacia
right inferior frontal lobe. Marked improvement in white matter
hyperintensity in the right frontal lobe. Right frontal extra-axial
fluid collection with interval improvement from the postoperative
MRI.

## 2021-02-19 MED ORDER — GADOBENATE DIMEGLUMINE 529 MG/ML IV SOLN
11.0000 mL | Freq: Once | INTRAVENOUS | Status: AC | PRN
  Administered 2021-02-19: 11 mL via INTRAVENOUS

## 2021-03-03 DIAGNOSIS — C44619 Basal cell carcinoma of skin of left upper limb, including shoulder: Secondary | ICD-10-CM | POA: Diagnosis not present

## 2021-03-03 DIAGNOSIS — C44629 Squamous cell carcinoma of skin of left upper limb, including shoulder: Secondary | ICD-10-CM | POA: Diagnosis not present

## 2021-03-03 DIAGNOSIS — D485 Neoplasm of uncertain behavior of skin: Secondary | ICD-10-CM | POA: Diagnosis not present

## 2021-03-16 DIAGNOSIS — D329 Benign neoplasm of meninges, unspecified: Secondary | ICD-10-CM | POA: Diagnosis not present

## 2021-03-16 DIAGNOSIS — I10 Essential (primary) hypertension: Secondary | ICD-10-CM | POA: Diagnosis not present

## 2021-03-24 ENCOUNTER — Other Ambulatory Visit: Payer: Self-pay | Admitting: Family Medicine

## 2021-03-24 DIAGNOSIS — E782 Mixed hyperlipidemia: Secondary | ICD-10-CM

## 2021-03-25 ENCOUNTER — Other Ambulatory Visit: Payer: Self-pay | Admitting: Family Medicine

## 2021-03-25 DIAGNOSIS — R251 Tremor, unspecified: Secondary | ICD-10-CM

## 2021-03-30 ENCOUNTER — Other Ambulatory Visit: Payer: Self-pay | Admitting: Family Medicine

## 2021-04-07 DIAGNOSIS — Z20822 Contact with and (suspected) exposure to covid-19: Secondary | ICD-10-CM | POA: Diagnosis not present

## 2021-04-13 ENCOUNTER — Encounter: Payer: Self-pay | Admitting: Family Medicine

## 2021-04-13 ENCOUNTER — Other Ambulatory Visit: Payer: Self-pay

## 2021-04-13 ENCOUNTER — Ambulatory Visit (INDEPENDENT_AMBULATORY_CARE_PROVIDER_SITE_OTHER): Payer: Medicare Other | Admitting: Family Medicine

## 2021-04-13 VITALS — BP 116/64 | HR 63 | Temp 97.6°F | Ht 62.0 in | Wt 119.0 lb

## 2021-04-13 DIAGNOSIS — D500 Iron deficiency anemia secondary to blood loss (chronic): Secondary | ICD-10-CM | POA: Diagnosis not present

## 2021-04-13 DIAGNOSIS — Z8603 Personal history of neoplasm of uncertain behavior: Secondary | ICD-10-CM

## 2021-04-13 DIAGNOSIS — E782 Mixed hyperlipidemia: Secondary | ICD-10-CM | POA: Diagnosis not present

## 2021-04-13 DIAGNOSIS — Z9889 Other specified postprocedural states: Secondary | ICD-10-CM

## 2021-04-13 DIAGNOSIS — I10 Essential (primary) hypertension: Secondary | ICD-10-CM

## 2021-04-13 DIAGNOSIS — R251 Tremor, unspecified: Secondary | ICD-10-CM

## 2021-04-13 NOTE — Patient Instructions (Signed)
You had labs performed today.  You will be contacted with the results of the labs once they are available, usually in the next 3 business days for routine lab work.  If you have an active my chart account, they will be released to your MyChart.  If you prefer to have these labs released to you via telephone, please let us know.  If you had a pap smear or biopsy performed, expect to be contacted in about 7-10 days.   TAPER:  Propranolol:  Week 1: 1 tablet twice daily x1 week Week 2: 1 tablet ONCE daily x1 week Week 3: 1 tablet EVER OTHER day x1 week. Then stop  Hydralazine: 1 tablet twice daily x1 week 1 tablet once daily x1 week.  Then STOP.  Monitor Blood pressure:  Goal less than 140/90.  Call me if you are having consistent readings above this.  We can add something to use once daily.

## 2021-04-13 NOTE — Progress Notes (Signed)
Subjective: CC: Checkup PCP: [name] Norlander, DO EH:[id number] P Zelaya is a 70 y.o. female, who is accompanied today's visit by her sister.  She is presenting to clinic today for:  1.  Hypertension with hyperlipidemia Patient reports that blood pressures have been under excellent control, typically below 120 over 70s.  She is compliant with hydralazine 50 mg 3 times daily, which was initiated after she was hospitalized for intracranial mass.  She is also treated with propranolol 10 mg 3 times daily for tremor.  She does note that she has not seen a great deal of improvement with the propranolol and would like to know if she could taper off of this.  Compliant with Lipitor 20 mg.  She wonders if in fact she needs this still because she has had quite a bit of weight loss since that surgery for her brain mass.  She reports a decent appetite but admits that she does not eat nearly as much as she used to.  2.  History of brain mass status post resection Patient had an MRI recently with her specialist and he "got a good checkup".  She is not expected to be seen again until June 2023.  Her Keppra for seizure prophylaxis was discontinued.   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   Urgency of urination    Wears glasses     Current Outpatient Medications:    atorvastatin (LIPITOR) 20 MG tablet, TAKE 1 TABLET BY MOUTH EVERY DAY, Disp: 90 tablet, Rfl: 1   Calcium Carbonate-Vit D-Min (CALCIUM 1200 PO), Take 1 tablet by mouth daily., Disp: , Rfl:    ERIVEDGE 150 MG capsule, Take by mouth., Disp: , Rfl:    escitalopram  (LEXAPRO) 10 MG tablet, Take 1 tablet (10 mg total) by mouth daily., Disp: 90 tablet, Rfl: 1   ferrous sulfate 325 (65 FE) MG tablet, Take 1 tablet (325 mg total) by mouth daily with breakfast., Disp: 30 tablet, Rfl: 3   fluticasone (CUTIVATE) 0.005 % ointment, Apply topically., Disp: , Rfl:    Glycerin-Hypromellose-PEG 400 0.2-0.2-1 % SOLN, Apply 1 drop to eye 2 (two) times daily as needed (dry eyes). , Disp: , Rfl:    hydrALAZINE (APRESOLINE) 50 MG tablet, TAKE 1 TABLET BY MOUTH EVERY 8 HOURS, Disp: 270 tablet, Rfl: 0   hydrocortisone 2.5 % ointment, SMARTSIG:Sparingly Topical Twice Daily (Patient not taking: Reported on 01/05/2021), Disp: , Rfl:    ketoconazole (NIZORAL) 2 % cream, SMARTSIG:1 Application Topical 1 to 2 Times Daily (Patient not taking: Reported on 01/05/2021), Disp: , Rfl:    levETIRAcetam (KEPPRA) 100 MG/ML solution, Take 5 mLs (500 mg total) by mouth 2 (two) times daily., Disp: 473 mL, Rfl: 12   loratadine (CLARITIN) 10 MG tablet, Take 10 mg by mouth daily as needed for allergies., Disp: , Rfl:    Multiple Vitamin (MULTIVITAMIN WITH MINERALS) TABS tablet, Take 1 tablet by mouth daily., Disp: , Rfl:    propranolol (INDERAL) 10  MG tablet, TAKE 1.5 TABLETS (15 MG TOTAL) BY MOUTH 3 (THREE) TIMES DAILY., Disp: 405 tablet, Rfl: 0 Social History   Socioeconomic History   Marital status: Legally Separated    Spouse name: Not on file   Number of children: 1   Years of education: 12   Highest education level: Not on file  Occupational History   Occupation: retired  Tobacco Use   Smoking status: Never   Smokeless tobacco: Never  Vaping Use   Vaping Use: Never used  Substance and Sexual Activity   Alcohol use: No   Drug use: No   Sexual activity: Not Currently  Other Topics Concern   Not on file  Social History Narrative   Patient resides locally.  She is currently separated from her spouse.  She is living in a mobile home near her daughter and 2 grandchildren and her father.    Social Determinants of Health   Financial Resource Strain: Low Risk    Difficulty of Paying Living Expenses: Not hard at all  Food Insecurity: No Food Insecurity   Worried About Charity fundraiser in the Last Year: Never true   [place] in the Last Year: Never true  Transportation Needs: No Transportation Needs   Lack of Transportation (Medical): No   Lack of Transportation (Non-Medical): No  Physical Activity: Sufficiently Active   Days of Exercise per Week: 7 days   Minutes of Exercise per Session: 30 min  Stress: No Stress Concern Present   Feeling of Stress : Not at all  Social Connections: Moderately Integrated   Frequency of Communication with Friends and Family: More than three times a week   Frequency of Social Gatherings with Friends and Family: More than three times a week   Attends Religious Services: More than 4 times per year   Active Member of [employer] or Organizations: Yes   Attends Music therapist: More than 4 times per year   Marital Status: Separated  Intimate Partner Violence: Not At Risk   Fear of Current or Ex-Partner: No   Emotionally Abused: No   Physically Abused: No   Sexually Abused: No   Family History  Problem Relation Age of Onset   Cancer Mother        unknown source, stomach and liver    Atrial fibrillation Father    Hyperlipidemia Father    Hypertension Father    Colon polyps Father    Hypertension Sister    Hyperlipidemia Sister    Thyroid disease Sister    Esophageal cancer Neg Hx    Pancreatic cancer Neg Hx     Objective: Office vital signs reviewed. BP 116/64   Pulse 63   Temp 97.6 F (36.4 C)   Ht '5\' 2"'$  (1.575 m)   Wt 119 lb (54 kg)   SpO2 95%   BMI 21.77 kg/m   Physical Examination:  General: Awake, alert, well appearing elderly female, No acute distress HEENT: Normal, sclera white, MMM Cardio: regular rate and rhythm, S1S2 heard, no murmurs appreciated Pulm: clear to auscultation bilaterally, no  wheezes, rhonchi or rales; normal work of breathing on room air Extremities: warm, well perfused, No edema, cyanosis or clubbing; +2 pulses bilaterally Neuro: No significant tremor appreciated during exam  Assessment/ Plan: 70 y.o. female   Tremor  History of resection of meningioma  Essential hypertension - Plan: Basic Metabolic Panel  Hyperlipemia, mixed  Iron deficiency anemia due to chronic blood loss - Plan: CBC,  Ferritin, Iron  Tremor has not really been responsive to propranolol.  Because of the class of medication I did advise a gradual taper so as to not precipitate any anginal symptoms.  Advised to reduce to 10 mg twice daily for a week then 10 mg daily for a week then 10 mg every other day for a week then stop  She will continue to follow-up with specialist yearly for MRI as prescribed.  Check renal function.  Blood pressure is under excellent control but perhaps a little too low for this patient's age.  I think that tapering off the propranolol is reasonable.  I am fine with her trying to taper off of hydralazine as well.  Advised for blood pressure recheck in the next couple of weeks.  She is to continue monitoring blood pressures at home.  As long as her blood pressures stay below 140/90, I see no need to continue antihypertensives.  However, if an antihypertensive is needed we will simplify her regimen and place her on Norvasc 5 mg daily.  She was amenable to this plan  Additionally, we may trial off of the Lipitor and recheck a fasting lipid panel in 3 months.  Would like to hold off on this for a little bit as we taper off of her hypertensive medication.  However, given her weight loss I think it is reasonable to reevaluate need for this medication  We will recheck her hemoglobin, iron level and ferritin level given history of iron deficiency anemia in the setting of chronic blood loss.  Last hemoglobin was within normal range  No orders of the defined types were placed in  this encounter.  No orders of the defined types were placed in this encounter.    [name] Norlander, DO [place] [phone]

## 2021-04-14 LAB — BASIC METABOLIC PANEL
BUN/Creatinine Ratio: 16 (ref 12–28)
BUN: 18 mg/dL (ref 8–27)
CO2: 25 mmol/L (ref 20–29)
Calcium: 9.9 mg/dL (ref 8.7–10.3)
Chloride: 104 mmol/L (ref 96–106)
Creatinine, Ser: 1.11 mg/dL — ABNORMAL HIGH (ref 0.57–1.00)
Glucose: 101 mg/dL — ABNORMAL HIGH (ref 65–99)
Potassium: 4.5 mmol/L (ref 3.5–5.2)
Sodium: 144 mmol/L (ref 134–144)
eGFR: 53 mL/min/{1.73_m2} — ABNORMAL LOW (ref >59)

## 2021-04-14 LAB — CBC
Hematocrit: 36.7 % (ref 34.0–46.6)
Hemoglobin: 11.8 g/dL (ref 11.1–15.9)
MCH: 27.2 pg (ref 26.6–33.0)
MCHC: 32.2 g/dL (ref 31.5–35.7)
MCV: 85 fL (ref 79–97)
Platelets: 213 10*3/uL (ref 150–450)
RBC: 4.34 x10E6/uL (ref 3.77–5.28)
RDW: 13.8 % (ref 11.7–15.4)
WBC: 4.7 10*3/uL (ref 3.4–10.8)

## 2021-04-14 LAB — FERRITIN: Ferritin: 49 ng/mL (ref 15–150)

## 2021-04-14 LAB — IRON: Iron: 61 ug/dL (ref 27–139)

## 2021-04-27 DIAGNOSIS — H40033 Anatomical narrow angle, bilateral: Secondary | ICD-10-CM | POA: Diagnosis not present

## 2021-04-27 DIAGNOSIS — H2513 Age-related nuclear cataract, bilateral: Secondary | ICD-10-CM | POA: Diagnosis not present

## 2021-05-06 ENCOUNTER — Other Ambulatory Visit: Payer: Self-pay

## 2021-05-06 ENCOUNTER — Ambulatory Visit (INDEPENDENT_AMBULATORY_CARE_PROVIDER_SITE_OTHER): Payer: Medicare Other

## 2021-05-06 ENCOUNTER — Encounter: Payer: Self-pay | Admitting: Family Medicine

## 2021-05-06 ENCOUNTER — Ambulatory Visit (INDEPENDENT_AMBULATORY_CARE_PROVIDER_SITE_OTHER): Payer: Medicare Other | Admitting: Family Medicine

## 2021-05-06 VITALS — BP 121/77 | HR 65 | Temp 98.0°F | Ht 62.0 in | Wt 121.0 lb

## 2021-05-06 DIAGNOSIS — M25511 Pain in right shoulder: Secondary | ICD-10-CM

## 2021-05-06 DIAGNOSIS — S42291A Other displaced fracture of upper end of right humerus, initial encounter for closed fracture: Secondary | ICD-10-CM | POA: Diagnosis not present

## 2021-05-06 IMAGING — DX DG SHOULDER 2+V*R*
3 series · 3 of 3 positions shown · non-contrast
Comparison: None.

CLINICAL DATA: Right shoulder pain.

EXAM:
RIGHT SHOULDER - 2+ VIEW

[shoulder ap (1 of 2)]
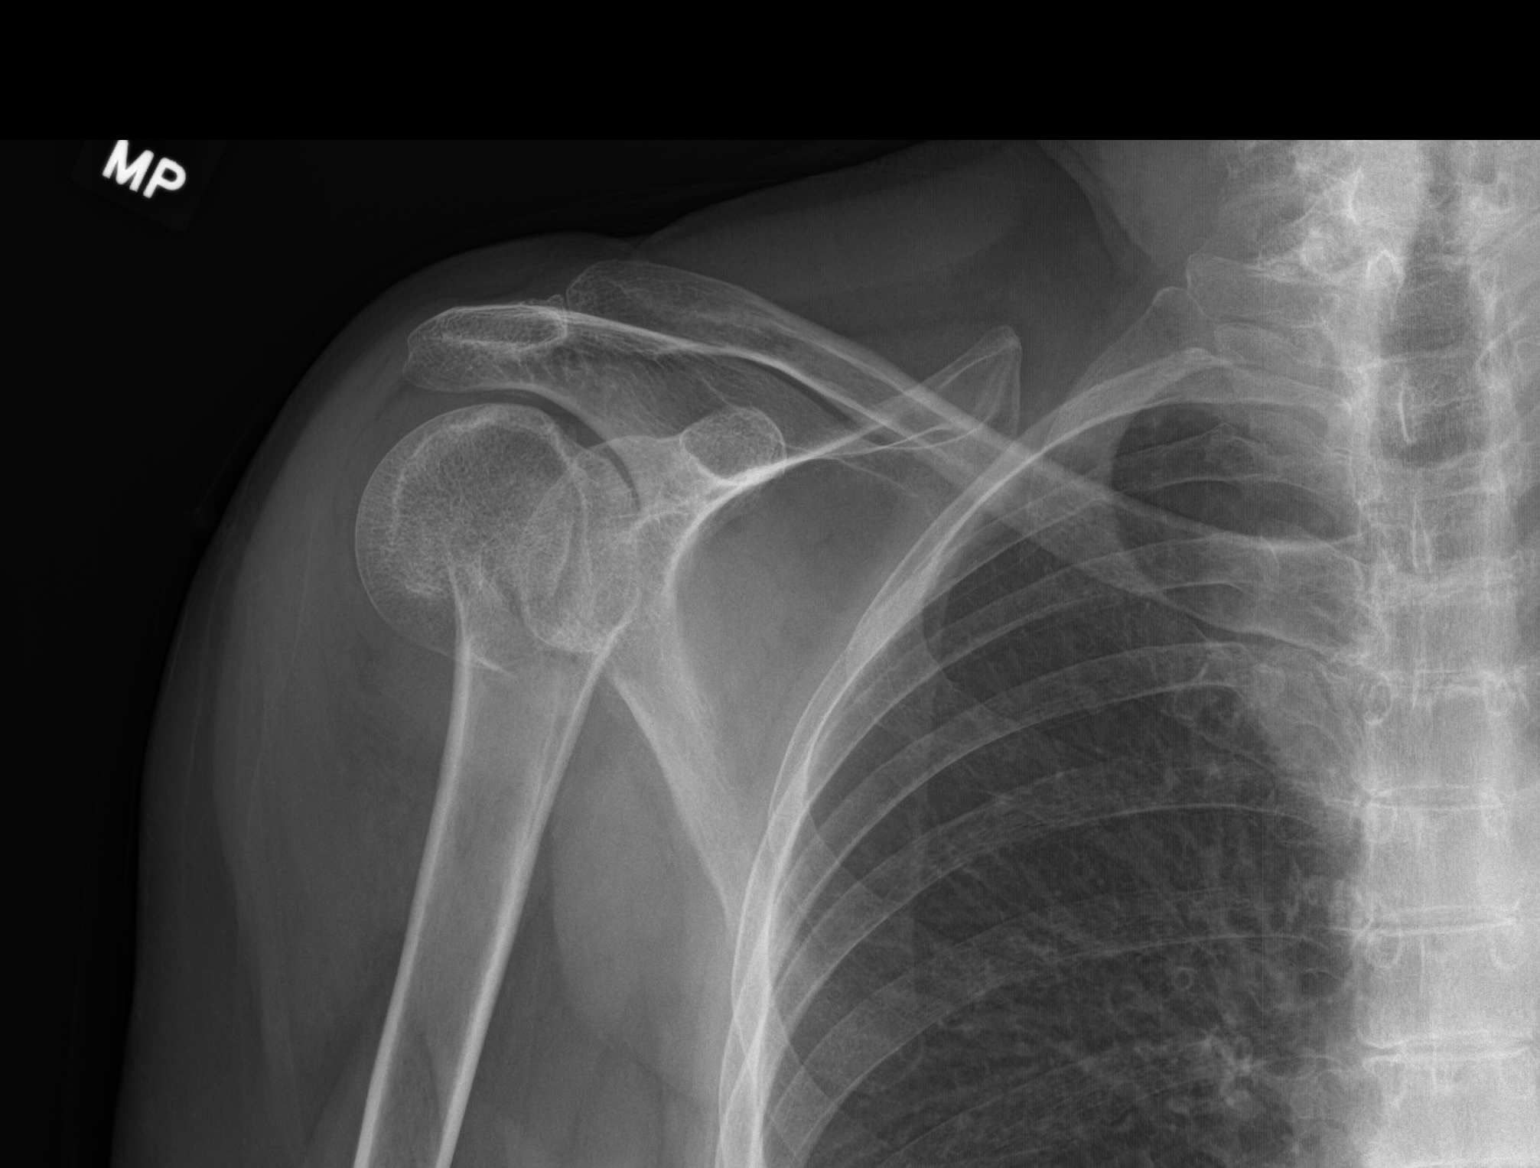

[shoulder obl]
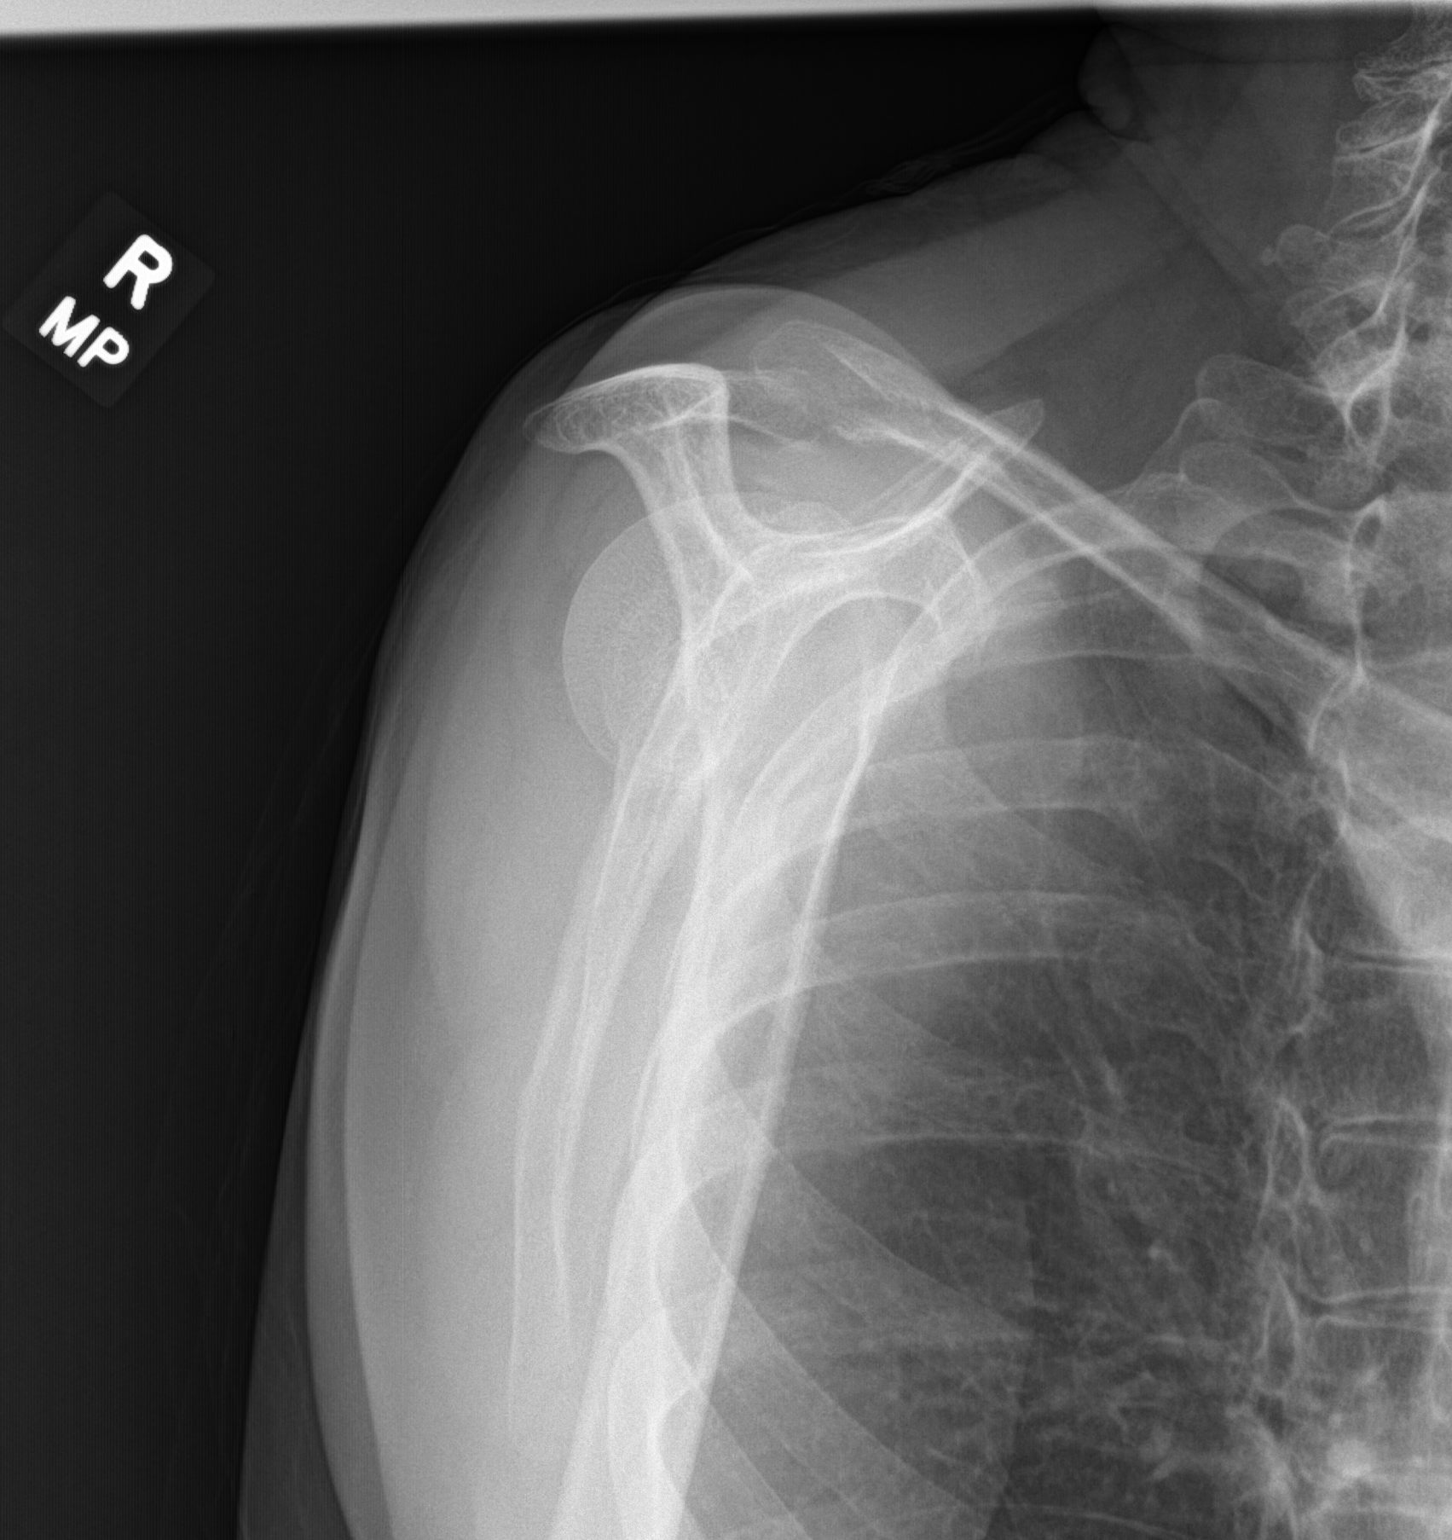

[shoulder ap (2 of 2)]
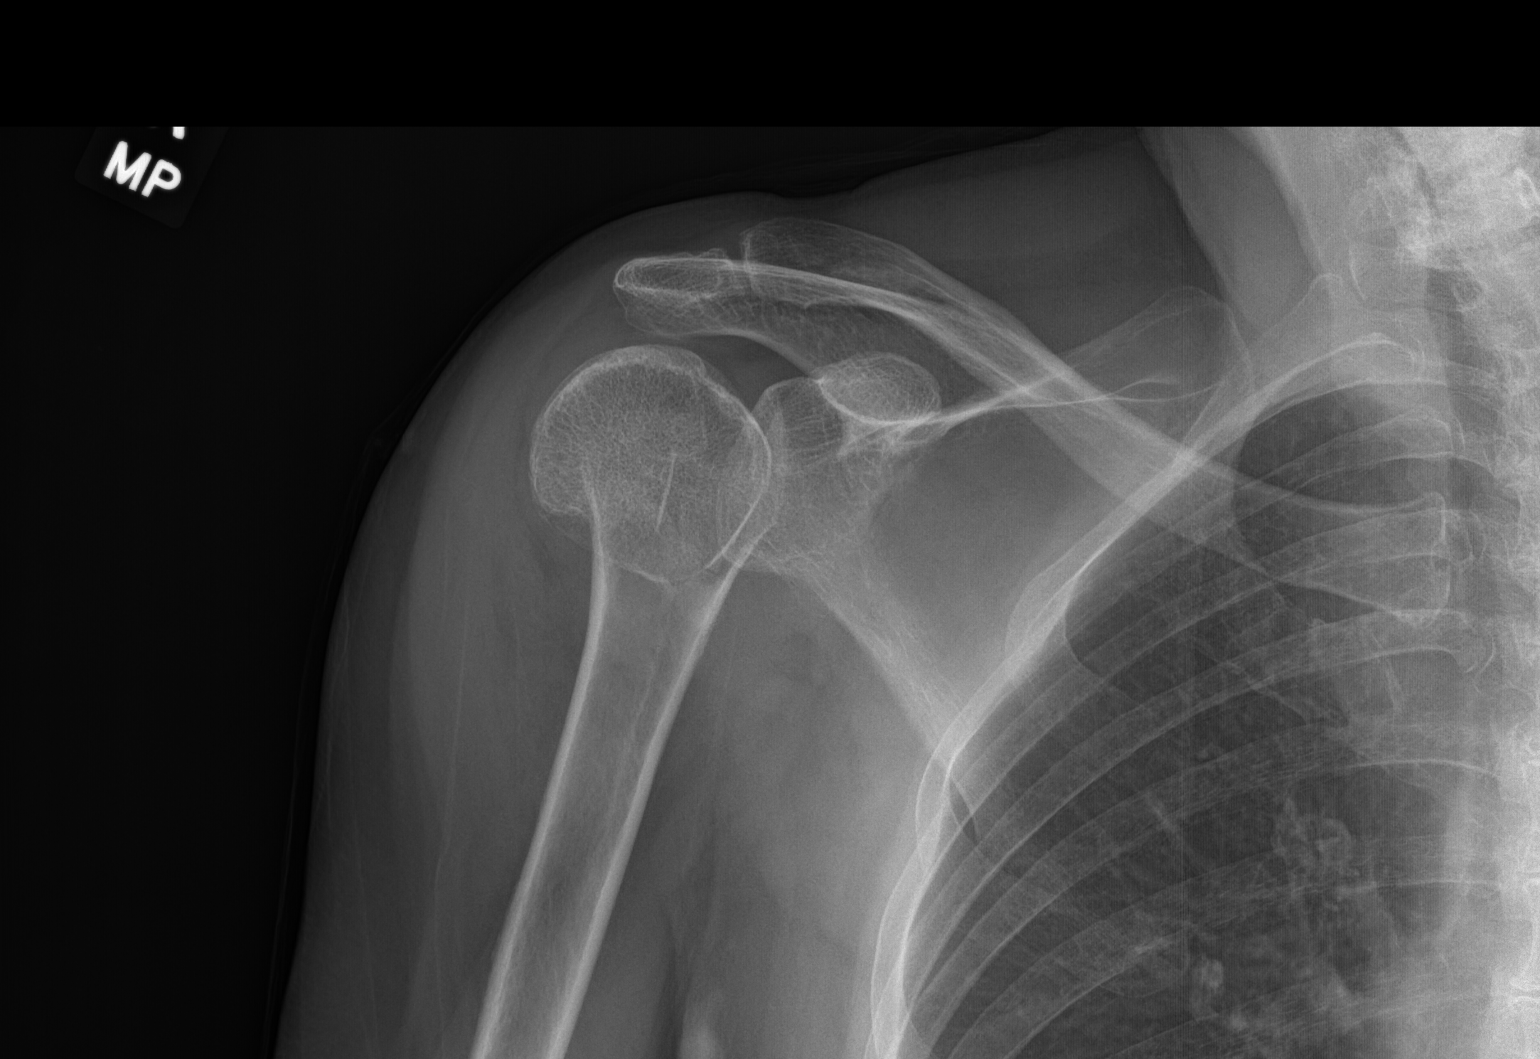

[3 of 3 positions shown; findings below may reference images not displayed]

FINDINGS: There is a displaced fracture of the humeral neck. Probable
associated greater tuberosity fracture. The AC joint is intact. The
scapula is intact. The visualized right lung is grossly clear and
the visualized right ribs are intact.
IMPRESSION: Humeral head and neck fractures.

## 2021-05-06 MED ORDER — CYCLOBENZAPRINE HCL 5 MG PO TABS: 5.0000 mg | ORAL_TABLET | Freq: Three times a day (TID) | ORAL | 0 refills | Status: DC | PRN

## 2021-05-06 MED ORDER — ACETAMINOPHEN 325 MG PO TABS: 650.0000 mg | ORAL_TABLET | Freq: Once | ORAL | Status: DC

## 2021-05-06 MED ORDER — ACETAMINOPHEN 500 MG PO TABS: 1000.0000 mg | ORAL_TABLET | Freq: Once | ORAL | Status: DC

## 2021-05-06 NOTE — Patient Instructions (Signed)
Dr. Amedeo Kinsman, ortho, Tuesday 05/12/2021 at 8:30 am.

## 2021-05-06 NOTE — Progress Notes (Signed)
Subjective:  Patient ID: [name] Page, female    DOB: July 31, 1951, 70 y.o.   MRN: [id number]  Patient Care Team: Janora Norlander, DO as PCP - General (Family Medicine) Alexis Frock, MD as Consulting Physician (Urology)   Chief Complaint:  right shoulder pain, fall   HPI: [name] Page is a 70 y.o. female presenting on 05/06/2021 for right shoulder pain, fall   Pt presents today for right shoulder pain after fall 2 hours ago. States she was walking down the stairs and tripped landing on shoulder. She denies hitting head or LOC. She reports pain and decreased movement since incident. Pain 8/10 and sharp to cramping.    Relevant past medical, surgical, family, and social history reviewed and updated as indicated.  Allergies and medications reviewed and updated. Data reviewed: Chart in Epic.   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   Urgency of urination    Wears glasses     Past Surgical History:  Procedure Laterality Date   APPLICATION OF CRANIAL NAVIGATION Right 09/11/2020   Procedure: APPLICATION OF CRANIAL NAVIGATION;  Surgeon: Vallarie Mare, MD;  Location: [place];  Service: Neurosurgery;  Laterality: Right;   CRANIOTOMY Right 09/11/2020   Procedure: Right Pterional Craniotomy for resection of anterior skull base mass with Brain Lab;  Surgeon: Vallarie Mare, MD;  Location: [place];  Service: Neurosurgery;  Laterality: Right;   CYSTOSCOPY WITH RETROGRADE PYELOGRAM, URETEROSCOPY AND STENT PLACEMENT Bilateral 08/11/2013   Procedure: CYSTOSCOPY WITH RETROGRADE PYELOGRAM, URETEROSCOPY AND STENT PLACEMENT;  Surgeon: Alexis Frock, MD;  Location[facility];  Service: Urology;  Laterality: Bilateral;   CYSTOSCOPY WITH URETEROSCOPY AND STENT PLACEMENT Bilateral 08/29/2013   Procedure: CYSTOSCOPY WITH BILATERAL URETEROSCOPY AND STENT REPLACEMENTS, stone extraction;  Surgeon: Alexis Frock, MD;  Location: [facility];  Service: Urology;  Laterality: Bilateral;   CYSTOSCOPY/RETROGRADE/URETEROSCOPY/STONE EXTRACTION WITH BASKET Bilateral 01/31/2016   Procedure: CYSTOSCOPY/BILATERAL RETROGRADE PYELOGRAMS/BILATERAL URETEROSCOPY/BILATERAL STONE EXTRACTION WITH BASKET/RIGHT URETERAL STENT PLACEMENT;  Surgeon: Raynelle Bring, MD;  Location[facility];  Service: Urology;  Laterality: Bilateral;   HEMIARTHROPLASTY SHOULDER FRACTURE Left 1992   HOLMIUM LASER APPLICATION Bilateral 86/76/1951   Procedure: HOLMIUM LASER APPLICATION;  Surgeon: Alexis Frock, MD;  Location: [facility];  Service: Urology;  Laterality: Bilateral;    Social History   Socioeconomic History   Marital status: Legally Separated    Spouse name: Not on file   Number of children: 1   Years of education: 12   Highest education level: Not on file  Occupational History   Occupation: retired  Tobacco Use   Smoking status: Never   Smokeless tobacco: Never  Vaping Use   Vaping Use: Never used  Substance and Sexual Activity   Alcohol use: No   Drug use: No   Sexual activity: Not Currently  Other Topics Concern   Not on file  Social History Narrative   Patient resides locally.  She is currently separated from her spouse.  She is living in a mobile  home near her daughter and 2 grandchildren and her father.   Social Determinants of Health   Financial Resource Strain: Low Risk    Difficulty of Paying Living Expenses: Not hard at all  Food Insecurity: No Food Insecurity   Worried About Charity fundraiser in the Last Year: Never true   [place] in the Last Year: Never true  Transportation Needs: No Transportation Needs    Lack of Transportation (Medical): No   Lack of Transportation (Non-Medical): No  Physical Activity: Sufficiently Active   Days of Exercise per Week: 7 days   Minutes of Exercise per Session: 30 min  Stress: No Stress Concern Present   Feeling of Stress : Not at all  Social Connections: Moderately Integrated   Frequency of Communication with Friends and Family: More than three times a week   Frequency of Social Gatherings with Friends and Family: More than three times a week   Attends Religious Services: More than 4 times per year   Active Member of [employer] or Organizations: Yes   Attends Music therapist: More than 4 times per year   Marital Status: Separated  Intimate Partner Violence: Not At Risk   Fear of Current or Ex-Partner: No   Emotionally Abused: No   Physically Abused: No   Sexually Abused: No    Outpatient Encounter Medications as of 05/06/2021  Medication Sig   atorvastatin (LIPITOR) 20 MG tablet TAKE 1 TABLET BY MOUTH EVERY DAY   Calcium Carbonate-Vit D-Min (CALCIUM 1200 PO) Take 1 tablet by mouth daily.   cyclobenzaprine (FLEXERIL) 5 MG tablet Take 1 tablet (5 mg total) by mouth 3 (three) times daily as needed for muscle spasms.   ERIVEDGE 150 MG capsule Take by mouth.   escitalopram (LEXAPRO) 10 MG tablet Take 1 tablet (10 mg total) by mouth daily.   ferrous sulfate 325 (65 FE) MG tablet Take 1 tablet (325 mg total) by mouth daily with breakfast.   Glycerin-Hypromellose-PEG 400 0.2-0.2-1 % SOLN Apply 1 drop to eye 2 (two) times daily as needed (dry eyes).    hydrALAZINE (APRESOLINE) 50 MG tablet TAKE 1 TABLET BY MOUTH EVERY 8 HOURS   loratadine (CLARITIN) 10 MG tablet Take 10 mg by mouth daily as needed for allergies.   Multiple Vitamin (MULTIVITAMIN WITH MINERALS) TABS tablet Take 1 tablet by mouth daily.   propranolol (INDERAL) 10 MG tablet TAKE 1.5 TABLETS (15 MG TOTAL) BY MOUTH 3 (THREE) TIMES DAILY.   Facility-Administered Encounter Medications as of  05/06/2021  Medication   acetaminophen (TYLENOL) tablet 1,000 mg   [DISCONTINUED] acetaminophen (TYLENOL) tablet 650 mg    Allergies  Allergen Reactions   Sulfa Antibiotics Rash    Review of Systems  Constitutional:  Negative for activity change, appetite change, chills, fatigue and fever.  HENT: Negative.    Eyes: Negative.   Respiratory:  Negative for cough, chest tightness and shortness of breath.   Cardiovascular:  Negative for chest pain, palpitations and leg swelling.  Gastrointestinal:  Negative for blood in stool, constipation, diarrhea, nausea and vomiting.  Endocrine: Negative.   Genitourinary:  Negative for dysuria, frequency and urgency.  Musculoskeletal:  Positive for arthralgias, joint swelling and myalgias. Negative for back pain, gait problem, neck pain and neck stiffness.  Skin: Negative.   Allergic/Immunologic: Negative.   Neurological:  Negative for dizziness and headaches.  Hematological: Negative.   Psychiatric/Behavioral:  Negative for confusion, hallucinations, sleep disturbance and suicidal ideas.   All other systems  reviewed and are negative.      Objective:  BP 121/77   Pulse 65   Temp 98 F (36.7 C)   Ht _0  (1.575 m)   Wt 121 lb (54.9 kg)   SpO2 97%   BMI 22.13 kg/m    Wt Readings from Last 3 Encounters:  05/06/21 121 lb (54.9 kg)  04/13/21 119 lb (54 kg)  01/05/21 129 lb (58.5 kg)    Physical Exam Vitals and nursing note reviewed.  Constitutional:      General: She is not in acute distress.    Appearance: Normal appearance. She is well-developed, well-groomed and normal weight. She is not ill-appearing, toxic-appearing or diaphoretic.  HENT:     Head: Normocephalic and atraumatic.     Jaw: There is normal jaw occlusion.     Right Ear: Hearing normal.     Left Ear: Hearing normal.     Nose: Nose normal.     Mouth/Throat:     Lips: Pink.     Mouth: Mucous membranes are moist.     Pharynx: Oropharynx is clear. Uvula midline.   Eyes:     General: Lids are normal.     Extraocular Movements: Extraocular movements intact.     Conjunctiva/sclera: Conjunctivae normal.     Pupils: Pupils are equal, round, and reactive to light.  Neck:     Thyroid: No thyroid mass, thyromegaly or thyroid tenderness.     Vascular: No carotid bruit or JVD.     Trachea: Trachea and phonation normal.  Cardiovascular:     Rate and Rhythm: Normal rate and regular rhythm.     Chest Wall: PMI is not displaced.     Pulses: Normal pulses.     Heart sounds: Normal heart sounds. No murmur heard.   No friction rub. No gallop.  Pulmonary:     Effort: Pulmonary effort is normal. No respiratory distress.     Breath sounds: Normal breath sounds. No wheezing.  Abdominal:     General: Bowel sounds are normal. There is no distension or abdominal bruit.     Palpations: Abdomen is soft. There is no hepatomegaly or splenomegaly.     Tenderness: There is no abdominal tenderness. There is no right CVA tenderness or left CVA tenderness.     Hernia: No hernia is present.  Musculoskeletal:     Right shoulder: Swelling, deformity, tenderness, bony tenderness and crepitus present. No effusion or laceration. Decreased range of motion. Decreased strength. Normal pulse.     Left shoulder: Normal.     Right upper arm: Swelling, deformity, tenderness and bony tenderness present. No edema or lacerations.     Right elbow: Normal.     Cervical back: Normal, normal range of motion and neck supple.     Right lower leg: No edema.     Left lower leg: No edema.  Lymphadenopathy:     Cervical: No cervical adenopathy.  Skin:    General: Skin is warm and dry.     Capillary Refill: Capillary refill takes less than 2 seconds.     Coloration: Skin is not cyanotic, jaundiced or pale.     Findings: No rash.  Neurological:     General: No focal deficit present.     Mental Status: She is alert and oriented to person, place, and time.     Cranial Nerves: Cranial nerves are  intact. No cranial nerve deficit.     Sensory: Sensation is intact. No sensory deficit.  Motor: Motor function is intact. No weakness.     Coordination: Coordination is intact. Coordination normal.     Gait: Gait is intact. Gait normal.     Deep Tendon Reflexes: Reflexes are normal and symmetric. Reflexes normal.  Psychiatric:        Attention and Perception: Attention and perception normal.        Mood and Affect: Mood and affect normal.        Speech: Speech normal.        Behavior: Behavior normal. Behavior is cooperative.        Thought Content: Thought content normal.        Cognition and Memory: Cognition and memory normal.        Judgment: Judgment normal.    Results for orders placed or performed in visit on 04/13/21  CBC  Result Value Ref Range   WBC 4.7 3.4 - 10.8 x10E3/uL   RBC 4.34 3.77 - 5.28 x10E6/uL   Hemoglobin 11.8 11.1 - 15.9 g/dL   Hematocrit 36.7 34.0 - 46.6 %   MCV 85 79 - 97 fL   MCH 27.2 26.6 - 33.0 pg   MCHC 32.2 31.5 - 35.7 g/dL   RDW 13.8 11.7 - 15.4 %   Platelets 213 150 - 450 x10E3/uL  Ferritin  Result Value Ref Range   Ferritin 49 15 - 150 ng/mL  Iron  Result Value Ref Range   Iron 61 27 - 139 ug/dL  Basic Metabolic Panel  Result Value Ref Range   Glucose 101 (H) 65 - 99 mg/dL   BUN 18 8 - 27 mg/dL   Creatinine, Ser 1.11 (H) 0.57 - 1.00 mg/dL   eGFR 53 (L) >59 mL/min/1.73   BUN/Creatinine Ratio 16 12 - 28   Sodium 144 134 - 144 mmol/L   Potassium 4.5 3.5 - 5.2 mmol/L   Chloride 104 96 - 106 mmol/L   CO2 25 20 - 29 mmol/L   Calcium 9.9 8.7 - 10.3 mg/dL     X-Ray: right shoulder: humeral head fracture, impacted, concerning for anterior dislocation. Preliminary x-ray reading by Monia Pouch, FNP-C, [facility].  Spoke with Dr. Amedeo Kinsman, ortho, he reviewed the images and states shoulder is not dislocated. He will see pt in the office on Tuesday. Advised sling, tylenol and muscle relaxer.   Pertinent labs & imaging results that were available  during my care of the patient were reviewed by me and considered in my medical decision making.  Assessment & Plan:  [name] Page was seen today for right shoulder pain, fall.  Diagnoses and all orders for this visit:  Acute pain of right shoulder Imaging as noted, acute right humeral head fracture. Tylenol given in office. Spoke with ortho, Dr. Amedeo Kinsman. He reviewed films and will see pt in office on Tuesday. Sling, tylenol, and muscle relaxer. RICE discussed. Report any new or worsening symptoms.  -     DG Shoulder Right; Future -     acetaminophen (TYLENOL) tablet 1,000 mg -     cyclobenzaprine (FLEXERIL) 5 MG tablet; Take 1 tablet (5 mg total) by mouth 3 (three) times daily as needed for muscle spasms.  Humeral head fracture, right, closed, initial encounter Imaging as noted, acute right humeral head fracture. Tylenol given in office. Spoke with ortho, Dr. Amedeo Kinsman. He reviewed films and will see pt in office on Tuesday. Sling, tylenol, and muscle relaxer. RICE discussed. Report any new or worsening symptoms.  -     cyclobenzaprine (FLEXERIL) 5 MG  tablet; Take 1 tablet (5 mg total) by mouth 3 (three) times daily as needed for muscle spasms.     Continue all other maintenance medications.  Follow up plan: Return if symptoms worsen or fail to improve.   Continue healthy lifestyle choices, including diet (rich in fruits, vegetables, and lean proteins, and low in salt and simple carbohydrates) and exercise (at least 30 minutes of moderate physical activity daily).  Educational handout given for RICE  The above assessment and management plan was discussed with the patient. The patient verbalized understanding of and has agreed to the management plan. Patient is aware to call the clinic if they develop any new symptoms or if symptoms persist or worsen. Patient is aware when to return to the clinic for a follow-up visit. Patient educated on when it is appropriate to go to the emergency department.    Monia Pouch, FNP-C [facility] [phone]

## 2021-05-07 ENCOUNTER — Telehealth: Payer: Self-pay | Admitting: Orthopedic Surgery

## 2021-05-07 NOTE — Telephone Encounter (Signed)
Patient called and wants to know if she can be seen sooner.  We do not have any open new patient spots for tomorrow.    Please call the patient back and advise   Her number is [phone]

## 2021-05-08 ENCOUNTER — Encounter: Payer: Self-pay | Admitting: Orthopedic Surgery

## 2021-05-08 ENCOUNTER — Other Ambulatory Visit: Payer: Self-pay

## 2021-05-08 ENCOUNTER — Ambulatory Visit (INDEPENDENT_AMBULATORY_CARE_PROVIDER_SITE_OTHER): Payer: Medicare Other | Admitting: Orthopedic Surgery

## 2021-05-08 VITALS — BP 174/84 | HR 83 | Ht 62.0 in | Wt 121.2 lb

## 2021-05-08 DIAGNOSIS — S42201A Unspecified fracture of upper end of right humerus, initial encounter for closed fracture: Secondary | ICD-10-CM

## 2021-05-08 DIAGNOSIS — W108XXA Fall (on) (from) other stairs and steps, initial encounter: Secondary | ICD-10-CM

## 2021-05-08 NOTE — Progress Notes (Signed)
New Patient Visit  Assessment: [name] Page is a 70 y.o. female with the following: 1. Closed fracture of proximal end of right humerus, unspecified fracture morphology, initial encounter  Plan: Patient sustained a right proximal humerus fracture, through the surgical neck, with some impaction.  The shoulder is not dislocated.  She appears comfortable in clinic today.  I think it is reasonable to continue to monitor her shoulder, and as the bone heals, her pain will continue to improve.  Once we are comfortable with the bone healing, assuming it does not displace further, we can allow her to start regaining her range of motion.  This was discussed in great detail today in clinic.  I do think that she will heal this, and will have improved function.  She would not have normal shoulder moving forward.  If the x-ray worsens at subsequent visits, we have discussed proceeding with a surgical intervention.  However, it is important to note that she had a brain tumor removed earlier this year, and is still recovering.  As such, I am not certain that she would be a candidate at this time for a significant surgical procedure.  This was discussed with the patient, and she is in agreement.  We will see her in a couple weeks, for repeat evaluation, including new x-rays.   Follow-up: Return in about 10 days (around 05/18/2021).  Subjective:  Chief Complaint  Patient presents with   Humerus FX    DOI 05/06/21-pt fell    History of Present Illness: [name] Page is a 70 y.o. female who presents for evaluation of right shoulder pain.  2 days ago, she lost her balance coming down some stairs.  She landed on her right side.  She noted immediate pain in her right shoulder.  She presented to her primary care provider, and x-rays demonstrated a proximal humerus fracture.  Since then, she is remained in the sling.  She is taking Tylenol.  She has not started taking Flexeril, as she lives alone, and is concerned  about sedation.  No numbness or tingling.  She has been recovering from brain surgery, to remove the tumor earlier this year.  She just recently started driving again, approximately 1 month ago.  She reports that her recent MRI demonstrates no recurrence.  She is no longer taking medications for seizures.  She is not taking any blood thinners.   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   sees Dr Allyson Sabal   Cancer [facility])    melanoma   Dysuria    Frequency of urination    Hematuria    History of kidney stones    Meningioma ([place]) 09/04/2020   Osteoporosis    Renal calculus, bilateral    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   Urgency of urination    Wears glasses     Past Surgical History:  Procedure Laterality Date   APPLICATION OF CRANIAL NAVIGATION Right 09/11/2020   Procedure: APPLICATION OF CRANIAL NAVIGATION;  Surgeon: Vallarie Mare, MD;  Location: [place];  Service: Neurosurgery;  Laterality: Right;   CRANIOTOMY Right 09/11/2020   Procedure: Right Pterional Craniotomy for resection of anterior skull base  mass with Brain Lab;  Surgeon: Vallarie Mare, MD;  Location: [place];  Service: Neurosurgery;  Laterality: Right;   CYSTOSCOPY WITH RETROGRADE PYELOGRAM, URETEROSCOPY AND STENT PLACEMENT Bilateral 08/11/2013   Procedure: CYSTOSCOPY WITH RETROGRADE PYELOGRAM, URETEROSCOPY AND STENT PLACEMENT;  Surgeon: Alexis Frock, MD;  [facility];  Service: Urology;  Laterality: Bilateral;   CYSTOSCOPY WITH URETEROSCOPY AND STENT PLACEMENT Bilateral 08/29/2013   Procedure: CYSTOSCOPY WITH BILATERAL URETEROSCOPY AND STENT REPLACEMENTS, stone extraction;  Surgeon: Alexis Frock, MD;  Location: [facility];  Service: Urology;  Laterality: Bilateral;   CYSTOSCOPY/RETROGRADE/URETEROSCOPY/STONE EXTRACTION WITH BASKET Bilateral 01/31/2016   Procedure: CYSTOSCOPY/BILATERAL RETROGRADE PYELOGRAMS/BILATERAL URETEROSCOPY/BILATERAL STONE EXTRACTION WITH BASKET/RIGHT URETERAL STENT PLACEMENT;  Surgeon: Raynelle Bring, MD;  [facility];  Service: Urology;  Laterality: Bilateral;   HEMIARTHROPLASTY SHOULDER FRACTURE Left 1992   HOLMIUM LASER APPLICATION Bilateral [id number]   Procedure: HOLMIUM LASER APPLICATION;  Surgeon: Alexis Frock, MD;  Location: [facility];  Service: Urology;  Laterality: Bilateral;    Family History  Problem Relation Age of Onset   Cancer Mother        unknown source, stomach and liver    Atrial fibrillation Father    Hyperlipidemia Father    Hypertension Father    Colon polyps Father    Hypertension Sister    Hyperlipidemia Sister    Thyroid disease Sister    Esophageal cancer Neg Hx    Pancreatic cancer Neg Hx    Social History   Tobacco Use   Smoking status: Never   Smokeless tobacco: Never  Vaping Use   Vaping Use: Never used  Substance Use Topics   Alcohol use: No   Drug use: No    Allergies  Allergen Reactions   Sulfa Antibiotics Rash    Current Meds  Medication Sig   atorvastatin (LIPITOR) 20 MG tablet TAKE 1 TABLET BY MOUTH EVERY DAY   Calcium Carbonate-Vit D-Min (CALCIUM 1200 PO) Take 1 tablet by mouth daily.   cyclobenzaprine (FLEXERIL) 5 MG tablet Take 1 tablet (5 mg total) by mouth 3 (three) times daily as needed for muscle spasms.   ERIVEDGE 150 MG capsule Take 150 mg by mouth daily.   escitalopram (LEXAPRO) 10 MG tablet Take 1 tablet (10 mg total) by mouth daily.   Glycerin-Hypromellose-PEG 400 0.2-0.2-1 % SOLN Apply 1 drop to eye 2 (two) times daily as needed (dry eyes).    loratadine (CLARITIN) 10 MG tablet Take 10 mg by mouth daily as needed for allergies.   Multiple Vitamin (MULTIVITAMIN WITH MINERALS) TABS  tablet Take 1 tablet by mouth daily.   Current Facility-Administered Medications for the 05/08/21 encounter (Office Visit) with Mordecai Rasmussen, MD  Medication   acetaminophen (TYLENOL) tablet 1,000 mg    Objective: BP (!) 174/84   Pulse 83   Ht '5\' 2"'$  (1.575 m)   Wt 121 lb 3.2 oz (55 kg)   BMI 22.17 kg/m   Physical Exam:  General: Elderly female., Alert and oriented., and No acute distress. Gait: Normal gait.  Evaluation the right shoulder demonstrates swelling and bruising.  Sensation is intact over the axillary patch.  Sensation intact to the right hand.  Active motion intact in the AIN/PIN/U nerve distribution.  Her arm is in a sling appropriately.  Fingers are warm and well-perfused.  2+ radial pulse.   IMAGING: I personally reviewed images previously obtained in clinic  X-ray of the right shoulder was previously obtained in clinic.  No shoulder dislocation.  There is a fracture of the surgical neck of the right proximal humerus, with some impaction.  Does not appear to be significant displacement of the greater or lesser tuberosities.  No obvious fracture lines are noted.  New Medications:  No orders of the defined types were placed in this encounter.     Mordecai Rasmussen, MD  05/08/2021 12:59 PM

## 2021-05-12 ENCOUNTER — Ambulatory Visit: Payer: Medicare Other | Admitting: Orthopedic Surgery

## 2021-05-19 DIAGNOSIS — C44619 Basal cell carcinoma of skin of left upper limb, including shoulder: Secondary | ICD-10-CM | POA: Diagnosis not present

## 2021-05-20 ENCOUNTER — Ambulatory Visit: Payer: Medicare Other

## 2021-05-20 ENCOUNTER — Ambulatory Visit (INDEPENDENT_AMBULATORY_CARE_PROVIDER_SITE_OTHER): Payer: Medicare Other | Admitting: Orthopedic Surgery

## 2021-05-20 ENCOUNTER — Encounter: Payer: Self-pay | Admitting: Orthopedic Surgery

## 2021-05-20 ENCOUNTER — Other Ambulatory Visit: Payer: Self-pay

## 2021-05-20 DIAGNOSIS — S42201D Unspecified fracture of upper end of right humerus, subsequent encounter for fracture with routine healing: Secondary | ICD-10-CM

## 2021-05-20 DIAGNOSIS — S42201A Unspecified fracture of upper end of right humerus, initial encounter for closed fracture: Secondary | ICD-10-CM

## 2021-05-20 NOTE — Patient Instructions (Signed)
  Stay in sling, ok to come out of sling for gentle range of motion of the elbow and wrist

## 2021-05-20 NOTE — Progress Notes (Signed)
Orthopaedic Clinic Return  Assessment: [name] Page is a 70 y.o. female with the following: Right proximal humerus fracture; stable, continue nonoperative management  Plan: Sling was replaced Stay in sling, ok to come out of sling for gentle range of motion of the elbow and wrist Medications as needed Follow up in 2 weeks    Follow-up: Return in about 2 weeks (around 06/03/2021).   Subjective:  Chief Complaint  Patient presents with   Shoulder Injury    Right shoulder fracture/ improving 05/06/21    History of Present Illness: [name] Page is a 70 y.o. female who returns to clinic for repeat evaluation of her right shoulder.  She sustained a proximal humerus fracture after a fall 2 weeks ago.  Pain is improving.  She is wearing sling appropriately.  She is sleeping in a recliner, which is helpful.  No numbness or tingling.  Occasional tylenol and ibuprofen.     Review of Systems: No fevers or chills No numbness or tingling No chest pain No shortness of breath No bowel or bladder dysfunction No GI distress No headaches   Objective: There were no vitals taken for this visit.  Physical Exam:  Alert and oriented, no acute distress.   Persistent ecchymosis and swelling over the anterior upper arm Sensation intact in axillary nerve distribution. Sensation intact to right hand. Active motion in AIN/PIN/U nerve distribution. Fingers are warm and well perfused.  2+ radial pulse  IMAGING: I personally ordered and reviewed the following images:  XR of the right shoulder obtained in clinic today.  Proximal humerus fracture with impaction of the articular fragment.  Mild posterior displacement as seen on the scapular Y view.  Joint is reduced.  Minimal interval displacement compared to previous XR.  Impression:  right proximal humerus fracture in stable alignment   Mordecai Rasmussen, MD 05/20/2021 8:39 AM

## 2021-06-03 ENCOUNTER — Ambulatory Visit (INDEPENDENT_AMBULATORY_CARE_PROVIDER_SITE_OTHER): Payer: Medicare Other | Admitting: Orthopedic Surgery

## 2021-06-03 ENCOUNTER — Other Ambulatory Visit: Payer: Self-pay

## 2021-06-03 ENCOUNTER — Ambulatory Visit: Payer: Medicare Other

## 2021-06-03 DIAGNOSIS — S42201D Unspecified fracture of upper end of right humerus, subsequent encounter for fracture with routine healing: Secondary | ICD-10-CM

## 2021-06-03 NOTE — Patient Instructions (Signed)
Ok to come out of the sling  Work of range of motion of the elbow and the wrist.   Ok to let the right arm hand at your side.

## 2021-06-04 ENCOUNTER — Encounter: Payer: Self-pay | Admitting: Orthopedic Surgery

## 2021-06-04 NOTE — Progress Notes (Signed)
Orthopaedic Clinic Return  Assessment: [name] Page is a 70 y.o. female with the following: Right proximal humerus fracture; stable, continue nonoperative management  Plan: Stay in sling, ok to come out of sling for gentle range of motion of the elbow and wrist Okay to remove sling when at home.  Continue to use the sling when ambulating, or out of the house. Medications as needed Follow up in 2 weeks  Follow-up: Return in about 2 weeks (around 06/17/2021).   Subjective:  Chief Complaint  Patient presents with   Shoulder Pain    Right//DOI 05/06/21//follow up    History of Present Illness: [name] Page is a 70 y.o. female who returns to clinic for repeat evaluation of her right shoulder.  She sustained a right proximal humerus fracture approximately 1 month ago.  She continues to improve.  Pain is improving.  She is still limited with her range of motion, and has not been using her right arm.  She has been started to come out of the sling more frequently.  Occasional over-the-counter pain medications.  Review of Systems: No fevers or chills No numbness or tingling No chest pain No shortness of breath No bowel or bladder dysfunction No GI distress No headaches   Objective: There were no vitals taken for this visit.  Physical Exam:  Alert and oriented, no acute distress.   Persistent ecchymosis and swelling over the anterior upper arm; considerably improved compared to last visit. Sensation intact in axillary nerve distribution. Sensation intact to right hand. Active motion in AIN/PIN/U nerve distribution. Fingers are warm and well perfused.  2+ radial pulse  IMAGING: I personally ordered and reviewed the following images:  X-ray of the right shoulder was obtained in clinic today compared to previous x-rays.  Overall, there has been no interval displacement compared to previous x-rays.  She has an anatomic neck fracture, with some impaction.  Slight posterior  displacement as indicated on the scapular Y view.  Joint remains reduced.  Impression: Right proximal humerus fracture, in stable alignment.   Mordecai Rasmussen, MD 06/04/2021 8:58 AM

## 2021-06-05 ENCOUNTER — Other Ambulatory Visit: Payer: Self-pay | Admitting: Family Medicine

## 2021-06-16 ENCOUNTER — Other Ambulatory Visit: Payer: Self-pay | Admitting: Family Medicine

## 2021-06-16 DIAGNOSIS — L570 Actinic keratosis: Secondary | ICD-10-CM | POA: Diagnosis not present

## 2021-06-16 DIAGNOSIS — C4491 Basal cell carcinoma of skin, unspecified: Secondary | ICD-10-CM | POA: Diagnosis not present

## 2021-06-16 DIAGNOSIS — L821 Other seborrheic keratosis: Secondary | ICD-10-CM | POA: Diagnosis not present

## 2021-06-16 DIAGNOSIS — Z85828 Personal history of other malignant neoplasm of skin: Secondary | ICD-10-CM | POA: Diagnosis not present

## 2021-06-16 DIAGNOSIS — C44529 Squamous cell carcinoma of skin of other part of trunk: Secondary | ICD-10-CM | POA: Diagnosis not present

## 2021-06-16 DIAGNOSIS — D485 Neoplasm of uncertain behavior of skin: Secondary | ICD-10-CM | POA: Diagnosis not present

## 2021-06-16 DIAGNOSIS — Z08 Encounter for follow-up examination after completed treatment for malignant neoplasm: Secondary | ICD-10-CM | POA: Diagnosis not present

## 2021-06-16 DIAGNOSIS — R251 Tremor, unspecified: Secondary | ICD-10-CM

## 2021-06-16 DIAGNOSIS — L905 Scar conditions and fibrosis of skin: Secondary | ICD-10-CM | POA: Diagnosis not present

## 2021-06-17 ENCOUNTER — Ambulatory Visit (INDEPENDENT_AMBULATORY_CARE_PROVIDER_SITE_OTHER): Payer: Medicare Other | Admitting: Orthopedic Surgery

## 2021-06-17 ENCOUNTER — Other Ambulatory Visit: Payer: Self-pay

## 2021-06-17 ENCOUNTER — Ambulatory Visit (INDEPENDENT_AMBULATORY_CARE_PROVIDER_SITE_OTHER): Payer: Medicare Other

## 2021-06-17 ENCOUNTER — Encounter: Payer: Self-pay | Admitting: Orthopedic Surgery

## 2021-06-17 VITALS — BP 153/73 | HR 75 | Ht 62.0 in | Wt 114.4 lb

## 2021-06-17 DIAGNOSIS — S42201D Unspecified fracture of upper end of right humerus, subsequent encounter for fracture with routine healing: Secondary | ICD-10-CM

## 2021-06-17 NOTE — Progress Notes (Signed)
Orthopaedic Clinic Return  Assessment: [name] Page is a 70 y.o. female with the following: Right proximal humerus fracture; stable, continue nonoperative management  Plan: XR stable Minimal pain at rest.  Some discomfort with ROM Ok to stay out of the sling Advance activities. She would like to work with PT, referral placed today Follow up in 6 weeks  Follow-up: Return in about 6 weeks (around 07/29/2021).   Subjective:  Chief Complaint  Patient presents with   Arm Injury    Followup Right proximal humerus fracture, DOI 05/06/21.  Doing ok, pain med at night sometimes, o/w good.  Wearing sling.    History of Present Illness: [name] Page is a 70 y.o. female who returns to clinic for repeat evaluation of her right shoulder.  She sustained a right proximal humerus fracture approximately 6 weeks ago.  Occasional pains in shoulder.  She is coming out of the sling more frequently.  No numbness or tingling.  She is able to move her shoulder more, with only tightness.   Review of Systems: No fevers or chills No numbness or tingling No chest pain No shortness of breath No bowel or bladder dysfunction No GI distress No headaches   Objective: BP (!) 153/73   Pulse 75   Ht 5\' 2"  (1.575 m)   Wt 114 lb 6.4 oz (51.9 kg)   BMI 20.92 kg/m   Physical Exam:  Alert and oriented, no acute distress.   Some residual ecchymosis in upper arm Sensation intact in axillary nerve distribution. Sensation intact to right hand. Active motion in AIN/PIN/U nerve distribution. Fingers are warm and well perfused.  2+ radial pulse Tolerates passive forward flexion to 90, abduction to 80  IMAGING: I personally ordered and reviewed the following images:  X-ray of the right shoulder was obtained in clinic today compared to previous x-rays.  No interval displacement of the fracture.  Humeral head remains reduced. Shaft is impacted into the head, with apparent loss of subacromial space.   Interval consolidation.   Impression: Right proximal humerus fracture, in stable alignment.   [name] Rasmussen, MD 06/17/2021 10:46 PM

## 2021-06-17 NOTE — Patient Instructions (Signed)
PT referral, start to work on ROM and strengthening

## 2021-06-18 DIAGNOSIS — D485 Neoplasm of uncertain behavior of skin: Secondary | ICD-10-CM | POA: Diagnosis not present

## 2021-06-25 ENCOUNTER — Other Ambulatory Visit: Payer: Self-pay

## 2021-06-25 ENCOUNTER — Encounter: Payer: Self-pay | Admitting: Physical Therapy

## 2021-06-25 ENCOUNTER — Ambulatory Visit: Payer: Medicare Other | Attending: Orthopedic Surgery | Admitting: Physical Therapy

## 2021-06-25 DIAGNOSIS — M25611 Stiffness of right shoulder, not elsewhere classified: Secondary | ICD-10-CM | POA: Diagnosis not present

## 2021-06-25 DIAGNOSIS — M6281 Muscle weakness (generalized): Secondary | ICD-10-CM | POA: Insufficient documentation

## 2021-06-25 DIAGNOSIS — M25511 Pain in right shoulder: Secondary | ICD-10-CM | POA: Diagnosis not present

## 2021-06-25 NOTE — Therapy (Signed)
[name] Page, [place], [address] Phone: [phone]   Fax:  [phone]  Physical Therapy Evaluation  Patient Details  Name: [name] Page MRN: [id number] Date of Birth: 16-Feb-1951 Referring Provider (PT): Larena Glassman MD   Encounter Date: 06/25/2021   PT End of Session - 06/25/21 1719     Visit Number 1    Number of Visits 12    Date for PT Re-Evaluation 09/23/21    Authorization Type FOTO AT LEAST EVERY 5TH VISIT.  PROGRESS NOTE AT 10TH VISIT.  KX MODIFIER AFTER 15 VISITS.    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   sees Dr Allyson Sabal   Cancer [facility])    melanoma   Dysuria    Frequency of urination    Hematuria    History of kidney stones    Meningioma ([place]) 09/04/2020   Osteoporosis    Renal calculus, bilateral    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   Urgency of urination    Wears glasses     Past Surgical History:  Procedure Laterality Date   APPLICATION OF CRANIAL NAVIGATION Right 09/11/2020   Procedure: APPLICATION OF CRANIAL NAVIGATION;  Surgeon: Vallarie Mare, MD;  Location: [place];  Service: Neurosurgery;  Laterality: Right;   CRANIOTOMY Right 09/11/2020   Procedure: Right Pterional Craniotomy for resection of anterior skull base mass with Brain Lab;  Surgeon: Vallarie Mare, MD;  Location: [place];  Service: Neurosurgery;  Laterality: Right;   CYSTOSCOPY WITH RETROGRADE PYELOGRAM, URETEROSCOPY AND STENT PLACEMENT Bilateral 08/11/2013   Procedure: CYSTOSCOPY WITH RETROGRADE PYELOGRAM, URETEROSCOPY AND STENT PLACEMENT;  Surgeon: Alexis Frock, MD;  Location: [facility];  Service: Urology;   Laterality: Bilateral;   CYSTOSCOPY WITH URETEROSCOPY AND STENT PLACEMENT Bilateral 08/29/2013   Procedure: CYSTOSCOPY WITH BILATERAL URETEROSCOPY AND STENT REPLACEMENTS, stone extraction;  Surgeon: Alexis Frock, MD;  [facility]  Service: Urology;  Laterality: Bilateral;   CYSTOSCOPY/RETROGRADE/URETEROSCOPY/STONE EXTRACTION WITH BASKET Bilateral 01/31/2016   Procedure: CYSTOSCOPY/BILATERAL RETROGRADE PYELOGRAMS/BILATERAL URETEROSCOPY/BILATERAL STONE EXTRACTION WITH BASKET/RIGHT URETERAL STENT PLACEMENT;  Surgeon: Raynelle Bring, MD;  Location[facility];  Service: Urology;  Laterality: Bilateral;   HEMIARTHROPLASTY SHOULDER FRACTURE Left 1992   HOLMIUM LASER APPLICATION Bilateral 32/95/1884   Procedure: HOLMIUM LASER APPLICATION;  Surgeon: Alexis Frock, MD;  Location: [facility];  Service: Urology;  Laterality: Bilateral;    There were no vitals filed for this visit.    Subjective Assessment - 06/25/21 1722     Subjective COVID-19 screen performed prior to patient entering clinic.  The patient presents to the clinic today s/p fall (down steps) on 05/06/21 resulting in a closed fracture of the proximal right humeus.  She is now out of her sling.  Her resting pain-level is about a 3 today and increases with right shoulder movement.  Her hope is to regain enough motion and function to avoid surgery.  Her most recent x-ray revealed the following:  No interval displacement of the fracture.  Humeral head remains reduced. Shaft is impacted into the  head, with apparent loss of subacromial space.  Interval consolidation.    Pertinent History Left TSA, OP, OA, Basal CA, s/p craniotomy.    Patient Stated Goals Try to avoid surgery.    Currently in Pain? Yes    Pain Score 3     Pain Location Shoulder    Pain Orientation Right    Pain Descriptors / Indicators Sore    Pain Type Acute pain    Pain Onset More than a month ago    Pain Frequency Constant     Aggravating Factors  See above.    Pain Relieving Factors See above.                [facility] PT Assessment - 06/25/21 0001       Assessment   Medical Diagnosis Closed fracture of proximal end of right humerus.    Referring Provider (PT) Larena Glassman MD    Onset Date/Surgical Date 05/06/21      Precautions   Precaution Comments OP.  No left UE weight bearing.      Balance Screen   Has the patient fallen in the past 6 months Yes    How many times? 2.    Has the patient had a decrease in activity level because of a fear of falling?  Yes    Is the patient reluctant to leave their home because of a fear of falling?  No      Home Environment   Living Environment Private residence      Prior Function   Level of Independence Independent      Observation/Other Assessments   Observations Ecchymosis over right forearm.      ROM / Strength   AROM / PROM / Strength PROM      PROM   Overall PROM Comments In supine:  Gentle P/AROM right shoulder flexion to 75 degree sand Er to 30 degrees.      Palpation   Palpation comment Minimal tenderness over right humeral head and proximal humerus.                        Objective measurements completed on examination: See above findings.                     PT Long Term Goals - 06/25/21 1745       PT LONG TERM GOAL #1   Title Independent with a HEP.    Time 6    Period Weeks    Status New      PT LONG TERM GOAL #2   Title Active right shoulder flexion to 135 degrees so the patient can easily reach overhead.    Time 6    Period Weeks    Status New      PT LONG TERM GOAL #3   Title Active ER to 70 degrees+ to allow for easily donning/doffing of apparel.    Time 6    Period Weeks    Status New      PT LONG TERM GOAL #4   Title Perform ADL's with pain not > 2-3/10.    Time 6    Period Weeks    Status New                    Plan - 06/25/21 1735     Clinical Impression Statement  The patient presents to OPPT s/p right closed proximal humeral fracture resulting from falling  down steps on 05/06/21.  She is now out of the sling.  As expected she has a loss of right shoulder range of motion.  She reports only mild palpable tenderness over her right humeral head and proximal humeral region.  She was instructed in a home exercise program today and did well with with these exercises today.  She hopes to increase her range of motion and function and avoid surgery. Patient will benefit from skilled physical therapy intervention to address pain and deficits.    Personal Factors and Comorbidities Comorbidity 1;Comorbidity 2;Other    Comorbidities Left TSA, OP, OA, Basal CA, s/p craniotomy.    Examination-Activity Limitations Other    Examination-Participation Restrictions Other;Laundry    Stability/Clinical Decision Making Stable/Uncomplicated    Clinical Decision Making Low    Rehab Potential Good    PT Frequency 2x / week    PT Duration 6 weeks    PT Treatment/Interventions ADLs/Self Care Home Management;Cryotherapy;Electrical Stimulation;Moist Heat;Therapeutic activities;Therapeutic exercise;Manual techniques;Patient/family education    PT Next Visit Plan Review HEP.  Begin with gentle right shoulder P/AROM.  Progress to seated UE Ranger.    Consulted and Agree with Plan of Care Patient             Patient will benefit from skilled therapeutic intervention in order to improve the following deficits and impairments:  Pain, Decreased activity tolerance, Decreased range of motion  Visit Diagnosis: Acute pain of right shoulder - Plan: PT plan of care cert/re-cert  Stiffness of right shoulder, not elsewhere classified - Plan: PT plan of care cert/re-cert     Problem List Patient Active Problem List   Diagnosis Date Noted   History of resection of meningioma 11/02/2020   Peripheral edema    Hypoalbuminemia due to protein-calorie malnutrition (HCC)    Transaminitis     Tremor    Seizure prophylaxis    Iron deficiency anemia    CKD (chronic kidney disease), stage III ([place]) 09/10/2020   Vasogenic Cerebral edema ([place]) 09/04/2020   Cancer of skin, squamous cell 09/04/2020   Nephrolithiasis 09/04/2020   Essential hypertension 11/01/2015   Osteoporosis 10/30/2015    Djuna Frechette, [place], PT 06/25/2021, 5:47 PM  [facility] [address] [place], [place], [address] Phone: [phone]   Fax:  [phone]  Name: MARGARETHA MAHAN MRN: [id number] Date of Birth: November 15, 1950

## 2021-06-25 NOTE — Patient Instructions (Signed)
[facility] EXERCISE PROGRAM Created by [place] Ailyn Gladd Oct 6th, 2022 View at [email or web address] using code: [email or web address] Total 4 Page 1 of 2 PENDULUM CIRCLES Shift your body weight in circles to allow your injured arm to swing in circles freely. Your injured arm should be fully relaxed. Duration 2 Minutes Complete 1 Set Perform 4 Times a Day PENDULUM FORWARD BACK - CODMAN Shift your body weight forward then back to allow your injured arm to swing forward and back freely. Your injured arm should be fully relaxed. Complete 1 Set Perform 1 Times a Day WAND EXTERNAL ROTATION - SUPINE ER Lie on your back holding a cane or wand with both hands. On the affected side, place a small rolled up towel or pillow under your elbow. Maintain approx. 90 degree bend at the elbow with your arm approximately 30-45 degrees away from your side. GENTLE. PAIN-FREE Use your other arm to pull the wand/cane to rotate the affected arm back into a stretch. Hold and then return to starting position and then repeat. Repeat 10 Times Hold 15 Seconds Complete 1 Set Perform 4 Times a Day Closed chain flexion stretch Step 1: Stand facing table/countertop with both hands on table/countertop. Step 2: Keep hands on table/countertop and step back away from table/countertop to stretch the shoulders. DO NOT WEIGHT BEAR THROUGH AFFECTED UPPER EXTREMITY. GENTLE AND PAIN-FREE. Repeat 10 Times Hold 10 Seconds Complete 1 Set Perform 4 Times a Day Powered

## 2021-06-29 ENCOUNTER — Ambulatory Visit: Payer: Medicare Other | Admitting: Physical Therapy

## 2021-06-29 ENCOUNTER — Encounter: Payer: Self-pay | Admitting: Physical Therapy

## 2021-06-29 ENCOUNTER — Other Ambulatory Visit: Payer: Self-pay

## 2021-06-29 DIAGNOSIS — M6281 Muscle weakness (generalized): Secondary | ICD-10-CM | POA: Diagnosis not present

## 2021-06-29 DIAGNOSIS — M25611 Stiffness of right shoulder, not elsewhere classified: Secondary | ICD-10-CM

## 2021-06-29 DIAGNOSIS — M25511 Pain in right shoulder: Secondary | ICD-10-CM

## 2021-06-29 NOTE — Therapy (Signed)
[facility] [place], [place], [address] Phone: [phone]   Fax:  [phone]  Physical Therapy Treatment  Patient Details  Name: BIONCA MCKEY MRN: [id number] Date of Birth: [birth year] Referring Provider (PT): Larena Glassman MD   Encounter Date: 06/29/2021   PT End of Session - 06/29/21 1347     Visit Number 2    Number of Visits 12    Date for PT Re-Evaluation 09/23/21    Authorization Type FOTO AT LEAST EVERY 5TH VISIT.  PROGRESS NOTE AT 10TH VISIT.  KX MODIFIER AFTER 15 VISITS.    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   sees Dr Allyson Sabal   Cancer [facility])    melanoma   Dysuria    Frequency of urination    Hematuria    History of kidney stones    Meningioma ([place]) 09/04/2020   Osteoporosis    Renal calculus, bilateral    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   Urgency of urination    Wears glasses     Past Surgical History:  Procedure Laterality Date   APPLICATION OF CRANIAL NAVIGATION Right 09/11/2020   Procedure: APPLICATION OF CRANIAL NAVIGATION;  Surgeon: Vallarie Mare, MD;  Location: [place];  Service: Neurosurgery;  Laterality: Right;   CRANIOTOMY Right 09/11/2020   Procedure: Right Pterional Craniotomy for resection of anterior skull base mass with Brain Lab;  Surgeon: Vallarie Mare, MD;  Location: [place];  Service: Neurosurgery;  Laterality: Right;   CYSTOSCOPY WITH RETROGRADE PYELOGRAM, URETEROSCOPY AND STENT PLACEMENT Bilateral 08/11/2013   Procedure: CYSTOSCOPY WITH RETROGRADE PYELOGRAM, URETEROSCOPY AND STENT PLACEMENT;  Surgeon: Alexis Frock, MD;  [facility];  Service: Urology;   Laterality: Bilateral;   CYSTOSCOPY WITH URETEROSCOPY AND STENT PLACEMENT Bilateral 08/29/2013   Procedure: CYSTOSCOPY WITH BILATERAL URETEROSCOPY AND STENT REPLACEMENTS, stone extraction;  Surgeon: Alexis Frock, MD;  Location: [facility];  Service: Urology;  Laterality: Bilateral;   CYSTOSCOPY/RETROGRADE/URETEROSCOPY/STONE EXTRACTION WITH BASKET Bilateral 01/31/2016   Procedure: CYSTOSCOPY/BILATERAL RETROGRADE PYELOGRAMS/BILATERAL URETEROSCOPY/BILATERAL STONE EXTRACTION WITH BASKET/RIGHT URETERAL STENT PLACEMENT;  Surgeon: Raynelle Bring, MD;  Location[facility];  Service: Urology;  Laterality: Bilateral;   HEMIARTHROPLASTY SHOULDER FRACTURE Left 1992   HOLMIUM LASER APPLICATION Bilateral 65/11/5463   Procedure: HOLMIUM LASER APPLICATION;  Surgeon: Alexis Frock, MD;  Location[facility];  Service: Urology;  Laterality: Bilateral;    There were no vitals filed for this visit.   Subjective Assessment - 06/29/21 1347     Subjective COVID-19 screen performed prior to patient entering clinic. Reports only soreness.    Pertinent History Left TSA, OP, OA, Basal CA, s/p craniotomy.    Patient Stated Goals Try to avoid surgery.    Currently in Pain? Yes    Pain Score 3     Pain Location Shoulder    Pain Orientation Right    Pain Descriptors / Indicators Sore    Pain Type Acute pain    Pain Onset More than a month ago    Pain Frequency Constant  [facility] PT Assessment - 06/29/21 0001       Assessment   Medical Diagnosis Closed fracture of proximal end of right humerus.    Referring Provider (PT) Larena Glassman MD    Onset Date/Surgical Date 05/06/21      Precautions   Precaution Comments OP.  No left UE weight bearing.                           [facility] Adult PT Treatment/Exercise - 06/29/21 0001       Modalities   Modalities Electrical Stimulation;Moist Heat      Moist Heat Therapy   Number Minutes Moist Heat 15  Minutes    Moist Heat Location Shoulder      Electrical Stimulation   Electrical Stimulation Location R shoulder    Electrical Stimulation Action Pre-Mod    Electrical Stimulation Parameters 80-150 hz x15 min    Electrical Stimulation Goals Pain      Manual Therapy   Manual Therapy Passive ROM    Passive ROM P/AAROM of R shoulder into flex, ER, IR with gentle holds at end range                          PT Long Term Goals - 06/25/21 1745       PT LONG TERM GOAL #1   Title Independent with a HEP.    Time 6    Period Weeks    Status New      PT LONG TERM GOAL #2   Title Active right shoulder flexion to 135 degrees so the patient can easily reach overhead.    Time 6    Period Weeks    Status New      PT LONG TERM GOAL #3   Title Active ER to 70 degrees+ to allow for easily donning/doffing of apparel.    Time 6    Period Weeks    Status New      PT LONG TERM GOAL #4   Title Perform ADL's with pain not > 2-3/10.    Time 6    Period Weeks    Status New                   Plan - 06/29/21 1437     Clinical Impression Statement Patient presented in clinic with minimal soreness of R shoulder. Patient happy to be out of her sling and did some house cleaning yesterday. Intermittant facial grimacing noted during P/AROM of R shoulder especially at end range. Firm end feels and smooth arc of motion noted during P/AROM of R shoulder. Normal modalities response noted following removal of the modalities.    Personal Factors and Comorbidities Comorbidity 1;Comorbidity 2;Other    Comorbidities Left TSA, OP, OA, Basal CA, s/p craniotomy.    Examination-Activity Limitations Other    Examination-Participation Restrictions Other;Laundry    Stability/Clinical Decision Making Stable/Uncomplicated    Rehab Potential Good    PT Frequency 2x / week    PT Duration 6 weeks    PT Treatment/Interventions ADLs/Self Care Home Management;Cryotherapy;Electrical  Stimulation;Moist Heat;Therapeutic activities;Therapeutic exercise;Manual techniques;Patient/family education    PT Next Visit Plan Review HEP.  Begin with gentle right shoulder P/AROM.  Progress to seated UE Ranger.    Consulted and Agree with Plan of Care Patient             Patient will benefit from skilled therapeutic intervention in  order to improve the following deficits and impairments:  Pain, Decreased activity tolerance, Decreased range of motion  Visit Diagnosis: Acute pain of right shoulder  Stiffness of right shoulder, not elsewhere classified  Muscle weakness (generalized)     Problem List Patient Active Problem List   Diagnosis Date Noted   History of resection of meningioma 11/02/2020   Peripheral edema    Hypoalbuminemia due to protein-calorie malnutrition (HCC)    Transaminitis    Tremor    Seizure prophylaxis    Iron deficiency anemia    CKD (chronic kidney disease), stage III ([place]) 09/10/2020   Vasogenic Cerebral edema ([place]) 09/04/2020   Cancer of skin, squamous cell 09/04/2020   Nephrolithiasis 09/04/2020   Essential hypertension 11/01/2015   Osteoporosis 10/30/2015    Standley Brooking, PTA 06/29/2021, 2:43 PM  [facility] [address] [place], [place], [address] Phone: [phone]   Fax:  [phone]  Name: HONORE WIPPERFURTH MRN: [id number] Date of Birth: 1950-11-27

## 2021-06-30 ENCOUNTER — Other Ambulatory Visit: Payer: Self-pay | Admitting: Family Medicine

## 2021-07-01 ENCOUNTER — Other Ambulatory Visit: Payer: Self-pay

## 2021-07-01 ENCOUNTER — Encounter: Payer: Self-pay | Admitting: Physical Therapy

## 2021-07-01 ENCOUNTER — Ambulatory Visit: Payer: Medicare Other | Admitting: Physical Therapy

## 2021-07-01 DIAGNOSIS — M6281 Muscle weakness (generalized): Secondary | ICD-10-CM | POA: Diagnosis not present

## 2021-07-01 DIAGNOSIS — M25611 Stiffness of right shoulder, not elsewhere classified: Secondary | ICD-10-CM

## 2021-07-01 DIAGNOSIS — M25511 Pain in right shoulder: Secondary | ICD-10-CM

## 2021-07-01 NOTE — Therapy (Signed)
[facility] [place], [place], [address] Phone: [phone]   Fax:  [phone]  Physical Therapy Treatment  Patient Details  Name: [name] Page MRN: [id number] Date of Birth: 12-16-1950 Referring Provider (PT): Larena Glassman MD   Encounter Date: 07/01/2021   PT End of Session - 07/01/21 1506     Visit Number 3    Number of Visits 12    Date for PT Re-Evaluation 09/23/21    Authorization Type FOTO AT LEAST EVERY 5TH VISIT.  PROGRESS NOTE AT 10TH VISIT.  KX MODIFIER AFTER 15 VISITS.    PT Start Time 0145    PT Stop Time 0240    PT Time Calculation (min) 55 min    Activity Tolerance Patient tolerated treatment well    Behavior During Therapy WFL for tasks assessed/performed             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   sees Dr Allyson Sabal   Cancer [facility])    melanoma   Dysuria    Frequency of urination    Hematuria    History of kidney stones    Meningioma ([place]) 09/04/2020   Osteoporosis    Renal calculus, bilateral    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   Urgency of urination    Wears glasses     Past Surgical History:  Procedure Laterality Date   APPLICATION OF CRANIAL NAVIGATION Right 09/11/2020   Procedure: APPLICATION OF CRANIAL NAVIGATION;  Surgeon: Vallarie Mare, MD;  Location: [place];  Service: Neurosurgery;  Laterality: Right;   CRANIOTOMY Right 09/11/2020   Procedure: Right Pterional Craniotomy for resection of anterior skull base mass with Brain Lab;  Surgeon: Vallarie Mare, MD;  Location: [place];  Service: Neurosurgery;  Laterality: Right;   CYSTOSCOPY WITH RETROGRADE PYELOGRAM, URETEROSCOPY AND STENT PLACEMENT Bilateral 08/11/2013   Procedure: CYSTOSCOPY WITH RETROGRADE PYELOGRAM, URETEROSCOPY AND STENT PLACEMENT;  Surgeon: Alexis Frock, MD;  Location: [facility];  Service: Urology;   Laterality: Bilateral;   CYSTOSCOPY WITH URETEROSCOPY AND STENT PLACEMENT Bilateral 08/29/2013   Procedure: CYSTOSCOPY WITH BILATERAL URETEROSCOPY AND STENT REPLACEMENTS, stone extraction;  Surgeon: Alexis Frock, MD;  Location: [facility];  Service: Urology;  Laterality: Bilateral;   CYSTOSCOPY/RETROGRADE/URETEROSCOPY/STONE EXTRACTION WITH BASKET Bilateral 01/31/2016   Procedure: CYSTOSCOPY/BILATERAL RETROGRADE PYELOGRAMS/BILATERAL URETEROSCOPY/BILATERAL STONE EXTRACTION WITH BASKET/RIGHT URETERAL STENT PLACEMENT;  Surgeon: Raynelle Bring, MD;  Location[facility];  Service: Urology;  Laterality: Bilateral;   HEMIARTHROPLASTY SHOULDER FRACTURE Left 1992   HOLMIUM LASER APPLICATION Bilateral 75/44/9201   Procedure: HOLMIUM LASER APPLICATION;  Surgeon: Alexis Frock, MD;  Location: [facility];  Service: Urology;  Laterality: Bilateral;    There were no vitals filed for this visit.   Subjective Assessment - 07/01/21 1504     Subjective COVID-19 screen performed prior to patient entering clinic.  No new complaints.    Pertinent History Left TSA, OP, OA, Basal CA, s/p craniotomy.    Patient Stated Goals Try to avoid surgery.    Currently in Pain? Yes    Pain Score 3     Pain Orientation Right    Pain Descriptors / Indicators Sore    Pain Type Acute pain    Pain Onset More than a month ago  [facility] Adult PT Treatment/Exercise - 07/01/21 0001       Modalities   Modalities Electrical Stimulation;Moist Heat      Moist Heat Therapy   Number Minutes Moist Heat 20 Minutes    Moist Heat Location --   Right shoulder     Electrical Stimulation   Electrical Stimulation Location Right shoulder    Electrical Stimulation Action Pre-mod.    Electrical Stimulation Parameters 80-150 Hz. x 20 minutes.    Electrical Stimulation Goals Pain      Manual Therapy   Manual Therapy Passive ROM    Passive ROM P/AROM x 25 minutes  with gentle low load long duration stretching technique utilized.                          PT Long Term Goals - 06/25/21 1745       PT LONG TERM GOAL #1   Title Independent with a HEP.    Time 6    Period Weeks    Status New      PT LONG TERM GOAL #2   Title Active right shoulder flexion to 135 degrees so the patient can easily reach overhead.    Time 6    Period Weeks    Status New      PT LONG TERM GOAL #3   Title Active ER to 70 degrees+ to allow for easily donning/doffing of apparel.    Time 6    Period Weeks    Status New      PT LONG TERM GOAL #4   Title Perform ADL's with pain not > 2-3/10.    Time 6    Period Weeks    Status New                   Plan - 07/01/21 1514     Clinical Impression Statement The patient is doing very well.  She is tolerated gentle right shoulder P/AROM wihtout complaint.  Her ER has already improved significantly.    Personal Factors and Comorbidities Comorbidity 1;Comorbidity 2;Other    Comorbidities Left TSA, OP, OA, Basal CA, s/p craniotomy.    Examination-Activity Limitations Other    Stability/Clinical Decision Making Stable/Uncomplicated    Rehab Potential Good    PT Frequency 2x / week    PT Duration 6 weeks    PT Treatment/Interventions ADLs/Self Care Home Management;Cryotherapy;Electrical Stimulation;Moist Heat;Therapeutic activities;Therapeutic exercise;Manual techniques;Patient/family education    PT Next Visit Plan Review HEP.  Begin with gentle right shoulder P/AROM.  Progress to seated UE Ranger.  Okay to begin supine cane exercises.    Consulted and Agree with Plan of Care Patient             Patient will benefit from skilled therapeutic intervention in order to improve the following deficits and impairments:  Pain, Decreased activity tolerance, Decreased range of motion  Visit Diagnosis: Acute pain of right shoulder  Stiffness of right shoulder, not elsewhere  classified     Problem List Patient Active Problem List   Diagnosis Date Noted   History of resection of meningioma 11/02/2020   Peripheral edema    Hypoalbuminemia due to protein-calorie malnutrition (HCC)    Transaminitis    Tremor    Seizure prophylaxis    Iron deficiency anemia    CKD (chronic kidney disease), stage III ([place]) 09/10/2020   Vasogenic Cerebral edema ([place]) 09/04/2020   Cancer of skin, squamous cell 09/04/2020   Nephrolithiasis  09/04/2020   Essential hypertension 11/01/2015   Osteoporosis 10/30/2015    Deann Mclaine, [place], PT 07/01/2021, 3:17 PM  [facility] [place], [place], [address] Phone: [phone]   Fax:  [phone]  Name: [name] Page MRN: [id number] Date of Birth: [birth year]

## 2021-07-02 DIAGNOSIS — Z85828 Personal history of other malignant neoplasm of skin: Secondary | ICD-10-CM | POA: Diagnosis not present

## 2021-07-02 DIAGNOSIS — Z08 Encounter for follow-up examination after completed treatment for malignant neoplasm: Secondary | ICD-10-CM | POA: Diagnosis not present

## 2021-07-02 DIAGNOSIS — Z86007 Personal history of in-situ neoplasm of skin: Secondary | ICD-10-CM | POA: Diagnosis not present

## 2021-07-03 DIAGNOSIS — D485 Neoplasm of uncertain behavior of skin: Secondary | ICD-10-CM | POA: Diagnosis not present

## 2021-07-03 DIAGNOSIS — L570 Actinic keratosis: Secondary | ICD-10-CM | POA: Diagnosis not present

## 2021-07-06 ENCOUNTER — Encounter: Payer: Self-pay | Admitting: Physical Therapy

## 2021-07-06 ENCOUNTER — Other Ambulatory Visit: Payer: Self-pay

## 2021-07-06 ENCOUNTER — Ambulatory Visit: Payer: Medicare Other | Admitting: Physical Therapy

## 2021-07-06 DIAGNOSIS — M6281 Muscle weakness (generalized): Secondary | ICD-10-CM | POA: Diagnosis not present

## 2021-07-06 DIAGNOSIS — M25511 Pain in right shoulder: Secondary | ICD-10-CM

## 2021-07-06 DIAGNOSIS — M25611 Stiffness of right shoulder, not elsewhere classified: Secondary | ICD-10-CM

## 2021-07-06 NOTE — Therapy (Signed)
[facility] [place], [place], [address] Phone: [phone]   Fax:  [phone]  Physical Therapy Treatment  Patient Details  Name: [name] Page MRN: [id number] Date of Birth: September 04, 1951 Referring Provider (PT): Larena Glassman MD   Encounter Date: 07/06/2021   PT End of Session - 07/06/21 1422     Visit Number 4    Number of Visits 12    Date for PT Re-Evaluation 09/23/21    Authorization Type FOTO AT LEAST EVERY 5TH VISIT.  PROGRESS NOTE AT 10TH VISIT.  KX MODIFIER AFTER 15 VISITS.    PT Start Time 0145    PT Stop Time 0238    PT Time Calculation (min) 53 min    Activity Tolerance Patient tolerated treatment well    Behavior During Therapy WFL for tasks assessed/performed             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   sees Dr Allyson Sabal   Cancer [facility])    melanoma   Dysuria    Frequency of urination    Hematuria    History of kidney stones    Meningioma ([place]) 09/04/2020   Osteoporosis    Renal calculus, bilateral    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   Urgency of urination    Wears glasses     Past Surgical History:  Procedure Laterality Date   APPLICATION OF CRANIAL NAVIGATION Right 09/11/2020   Procedure: APPLICATION OF CRANIAL NAVIGATION;  Surgeon: Vallarie Mare, MD;  Location: [place];  Service: Neurosurgery;  Laterality: Right;   CRANIOTOMY Right 09/11/2020   Procedure: Right Pterional Craniotomy for resection of anterior skull base mass with Brain Lab;  Surgeon: Vallarie Mare, MD;  Location: [place];  Service: Neurosurgery;  Laterality: Right;   CYSTOSCOPY WITH RETROGRADE PYELOGRAM, URETEROSCOPY AND STENT PLACEMENT Bilateral 08/11/2013   Procedure: CYSTOSCOPY WITH RETROGRADE PYELOGRAM, URETEROSCOPY AND STENT PLACEMENT;  Surgeon: Alexis Frock, MD;  [facility];  Service: Urology;   Laterality: Bilateral;   CYSTOSCOPY WITH URETEROSCOPY AND STENT PLACEMENT Bilateral 08/29/2013   Procedure: CYSTOSCOPY WITH BILATERAL URETEROSCOPY AND STENT REPLACEMENTS, stone extraction;  Surgeon: Alexis Frock, MD;  Location: [facility];  Service: Urology;  Laterality: Bilateral;   CYSTOSCOPY/RETROGRADE/URETEROSCOPY/STONE EXTRACTION WITH BASKET Bilateral 01/31/2016   Procedure: CYSTOSCOPY/BILATERAL RETROGRADE PYELOGRAMS/BILATERAL URETEROSCOPY/BILATERAL STONE EXTRACTION WITH BASKET/RIGHT URETERAL STENT PLACEMENT;  Surgeon: Raynelle Bring, MD;  [facility];  Service: Urology;  Laterality: Bilateral;   HEMIARTHROPLASTY SHOULDER FRACTURE Left 1992   HOLMIUM LASER APPLICATION Bilateral 50/53/9767   Procedure: HOLMIUM LASER APPLICATION;  Surgeon: Alexis Frock, MD;  Location: [facility];  Service: Urology;  Laterality: Bilateral;    There were no vitals filed for this visit.   Subjective Assessment - 07/06/21 1422     Subjective COVID-19 screen performed prior to patient entering clinic.  Doing well.  Doing home exercises.    Pertinent History Left TSA, OP, OA, Basal CA, s/p craniotomy.    Patient Stated Goals Try to avoid surgery.    Currently in Pain? Yes    Pain Score 3     Pain Location Shoulder    Pain Orientation Right    Pain Descriptors / Indicators Sore    Pain Type Acute pain    Pain Onset More than a month ago  [facility] Adult PT Treatment/Exercise - 07/06/21 0001       Modalities   Modalities Electrical Stimulation;Moist Heat      Moist Heat Therapy   Number Minutes Moist Heat 20 Minutes    Moist Heat Location --   RT shoulder.     Acupuncturist Location RT shoulder.    Electrical Stimulation Action Pre-mod.    Electrical Stimulation Parameters 80-150 Hz. x 20 minutes.    Electrical Stimulation Goals Pain      Manual Therapy   Manual Therapy Passive  ROM    Passive ROM In supine:  P/AROM x 23 minutes to patient's right shoulder.                          PT Long Term Goals - 06/25/21 1745       PT LONG TERM GOAL #1   Title Independent with a HEP.    Time 6    Period Weeks    Status New      PT LONG TERM GOAL #2   Title Active right shoulder flexion to 135 degrees so the patient can easily reach overhead.    Time 6    Period Weeks    Status New      PT LONG TERM GOAL #3   Title Active ER to 70 degrees+ to allow for easily donning/doffing of apparel.    Time 6    Period Weeks    Status New      PT LONG TERM GOAL #4   Title Perform ADL's with pain not > 2-3/10.    Time 6    Period Weeks    Status New                   Plan - 07/06/21 1424     Clinical Impression Statement Patient pleased with her progress thus far.  She states she has been compliant to her HEP.  Plan to begin AAROM (ie:  Seated UE ranger).    Personal Factors and Comorbidities Comorbidity 1;Comorbidity 2;Other    Comorbidities Left TSA, OP, OA, Basal CA, s/p craniotomy.    Examination-Activity Limitations Other    Examination-Participation Restrictions Other;Laundry    Stability/Clinical Decision Making Stable/Uncomplicated    Rehab Potential Good    PT Frequency 2x / week    PT Duration 6 weeks    PT Treatment/Interventions ADLs/Self Care Home Management;Cryotherapy;Electrical Stimulation;Moist Heat;Therapeutic activities;Therapeutic exercise;Manual techniques;Patient/family education    PT Next Visit Plan Review HEP.  Begin with gentle right shoulder P/AROM.  Progress to seated UE Ranger.  Okay to begin supine cane exercises.    Consulted and Agree with Plan of Care Patient             Patient will benefit from skilled therapeutic intervention in order to improve the following deficits and impairments:  Pain, Decreased activity tolerance, Decreased range of motion  Visit Diagnosis: Acute pain of right  shoulder  Stiffness of right shoulder, not elsewhere classified     Problem List Patient Active Problem List   Diagnosis Date Noted   History of resection of meningioma 11/02/2020   Peripheral edema    Hypoalbuminemia due to protein-calorie malnutrition (HCC)    Transaminitis    Tremor    Seizure prophylaxis    Iron deficiency anemia    CKD (chronic kidney disease), stage III ([place]) 09/10/2020   Vasogenic Cerebral edema ([place]) 09/04/2020   Cancer  of skin, squamous cell 09/04/2020   Nephrolithiasis 09/04/2020   Essential hypertension 11/01/2015   Osteoporosis 10/30/2015    Kameron Glazebrook, [place], PT 07/06/2021, 3:19 PM  [facility] [address] [place], [place], [address] Phone: [phone]   Fax:  [phone]  Name: ALMEE PELPHREY MRN: [id number] Date of Birth: 1951/08/04

## 2021-07-08 ENCOUNTER — Other Ambulatory Visit: Payer: Self-pay

## 2021-07-08 ENCOUNTER — Ambulatory Visit: Payer: Medicare Other

## 2021-07-08 DIAGNOSIS — M6281 Muscle weakness (generalized): Secondary | ICD-10-CM | POA: Diagnosis not present

## 2021-07-08 DIAGNOSIS — M25611 Stiffness of right shoulder, not elsewhere classified: Secondary | ICD-10-CM

## 2021-07-08 DIAGNOSIS — M25511 Pain in right shoulder: Secondary | ICD-10-CM | POA: Diagnosis not present

## 2021-07-08 NOTE — Therapy (Signed)
[facility] [place], [place], [address] Phone: [phone]   Fax:  [phone]  Physical Therapy Treatment  Patient Details  Name: [name] Page MRN: [id number] Date of Birth: 20-Jul-1951 Referring Provider (PT): Larena Glassman MD   Encounter Date: 07/08/2021   PT End of Session - 07/08/21 1257     Visit Number 5    Number of Visits 12    Date for PT Re-Evaluation 09/23/21    Authorization Type FOTO AT LEAST EVERY 5TH VISIT.  PROGRESS NOTE AT 10TH VISIT.  KX MODIFIER AFTER 15 VISITS.    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   sees Dr Allyson Sabal   Cancer [facility])    melanoma   Dysuria    Frequency of urination    Hematuria    History of kidney stones    Meningioma ([place]) 09/04/2020   Osteoporosis    Renal calculus, bilateral    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   Urgency of urination    Wears glasses     Past Surgical History:  Procedure Laterality Date   APPLICATION OF CRANIAL NAVIGATION Right 09/11/2020   Procedure: APPLICATION OF CRANIAL NAVIGATION;  Surgeon: Vallarie Mare, MD;  Location: [place];  Service: Neurosurgery;  Laterality: Right;   CRANIOTOMY Right 09/11/2020   Procedure: Right Pterional Craniotomy for resection of anterior skull base mass with Brain Lab;  Surgeon: Vallarie Mare, MD;  Location: [place];  Service: Neurosurgery;  Laterality: Right;   CYSTOSCOPY WITH RETROGRADE PYELOGRAM, URETEROSCOPY AND STENT PLACEMENT Bilateral 08/11/2013   Procedure: CYSTOSCOPY WITH RETROGRADE PYELOGRAM, URETEROSCOPY AND STENT PLACEMENT;  Surgeon: Alexis Frock, MD;  [facility];  Service: Urology;   Laterality: Bilateral;   CYSTOSCOPY WITH URETEROSCOPY AND STENT PLACEMENT Bilateral 08/29/2013   Procedure: CYSTOSCOPY WITH BILATERAL URETEROSCOPY AND STENT REPLACEMENTS, stone extraction;  Surgeon: Alexis Frock, MD;  Location: [facility];  Service: Urology;  Laterality: Bilateral;   CYSTOSCOPY/RETROGRADE/URETEROSCOPY/STONE EXTRACTION WITH BASKET Bilateral 01/31/2016   Procedure: CYSTOSCOPY/BILATERAL RETROGRADE PYELOGRAMS/BILATERAL URETEROSCOPY/BILATERAL STONE EXTRACTION WITH BASKET/RIGHT URETERAL STENT PLACEMENT;  Surgeon: Raynelle Bring, MD;  Location[facility];  Service: Urology;  Laterality: Bilateral;   HEMIARTHROPLASTY SHOULDER FRACTURE Left 1992   HOLMIUM LASER APPLICATION Bilateral 05/03/4817   Procedure: HOLMIUM LASER APPLICATION;  Surgeon: Alexis Frock, MD;  Location: [facility];  Service: Urology;  Laterality: Bilateral;    There were no vitals filed for this visit.   Subjective Assessment - 07/08/21 1257     Subjective COVID-19 screen performed prior to patient entering clinic. Pt reports soreness to right shoulder soreness upon arrival, but no real pain.    Pertinent History Left TSA, OP, OA, Basal CA, s/p craniotomy.    Patient Stated Goals Try to avoid surgery.    Currently in Pain? Yes    Pain Score 2     Pain Location Shoulder    Pain Orientation Right    Pain Descriptors / Indicators Sore    Pain Type Acute pain    Pain Onset More than a month  ago                [facility] PT Assessment - 07/08/21 0001       Observation/Other Assessments   Focus on Therapeutic Outcomes (FOTO)  39%   5th visit, R shoulder, 07/08/21                          [facility] Adult PT Treatment/Exercise - 07/08/21 0001       Exercises   Exercises Shoulder      Shoulder Exercises: Supine   Flexion AAROM;20 reps   1 set x 10 reps, R shoulder ROM limited by L shoulder     Shoulder Exercises: Standing   Flexion AAROM;Left;10 reps   2 sets,  ball on wall, cues to avoid compensatory shoulder movements     Shoulder Exercises: ROM/Strengthening   Ranger Seated   Forward/Backward, Left/Right, CW/CCW circles x 3 mins each     Modalities   Modalities Electrical Stimulation;Moist Heat      Moist Heat Therapy   Moist Heat Location Shoulder   Right     Electrical Stimulation   Electrical Stimulation Location R shoulder    Electrical Stimulation Action Pre-mod    Electrical Stimulation Parameters 80-150 hz x 20 min    Electrical Stimulation Goals Pain      Manual Therapy   Manual Therapy Passive ROM    Passive ROM In supine: P/AAROM of right shoulder into flex, ER, IR, abduction                          PT Long Term Goals - 06/25/21 1745       PT LONG TERM GOAL #1   Title Independent with a HEP.    Time 6    Period Weeks    Status New      PT LONG TERM GOAL #2   Title Active right shoulder flexion to 135 degrees so the patient can easily reach overhead.    Time 6    Period Weeks    Status New      PT LONG TERM GOAL #3   Title Active ER to 70 degrees+ to allow for easily donning/doffing of apparel.    Time 6    Period Weeks    Status New      PT LONG TERM GOAL #4   Title Perform ADL's with pain not > 2-3/10.    Time 6    Period Weeks    Status New                   Plan - 07/08/21 1257     Clinical Impression Statement Pt tolerated addition of UE Ranger while seated well without increase in pain and soreness.  Pt states that she has been performing her HEP as instructed.  Added towel shoulder flexion on wall to HEP today.  Pt unable to utilize can for AAROM in supine due to lack of ROM in left shoulder.  Pt reported 1/10 right shoulder pain at completion of today's treatment session.  Pt would benefit with continuation of current plan of care to address residual limitation and assist with returning to PLOF.    Personal Factors and Comorbidities Comorbidity 1;Comorbidity 2;Other     Comorbidities Left TSA, OP, OA, Basal CA, s/p craniotomy.    Examination-Activity Limitations Other    Examination-Participation Restrictions Other;Laundry    Stability/Clinical Decision Making Stable/Uncomplicated  Rehab Potential Good    PT Frequency 2x / week    PT Duration 6 weeks    PT Treatment/Interventions ADLs/Self Care Home Management;Cryotherapy;Electrical Stimulation;Moist Heat;Therapeutic activities;Therapeutic exercise;Manual techniques;Patient/family education    PT Next Visit Plan Review HEP.  Begin with gentle right shoulder P/AROM.  Progress to seated UE Ranger.  Okay to begin supine cane exercises.    Consulted and Agree with Plan of Care Patient             Patient will benefit from skilled therapeutic intervention in order to improve the following deficits and impairments:  Pain, Decreased activity tolerance, Decreased range of motion  Visit Diagnosis: Acute pain of right shoulder  Stiffness of right shoulder, not elsewhere classified     Problem List Patient Active Problem List   Diagnosis Date Noted   History of resection of meningioma 11/02/2020   Peripheral edema    Hypoalbuminemia due to protein-calorie malnutrition (HCC)    Transaminitis    Tremor    Seizure prophylaxis    Iron deficiency anemia    CKD (chronic kidney disease), stage III ([place]) 09/10/2020   Vasogenic Cerebral edema ([place]) 09/04/2020   Cancer of skin, squamous cell 09/04/2020   Nephrolithiasis 09/04/2020   Essential hypertension 11/01/2015   Osteoporosis 10/30/2015    Kathrynn Ducking, PTA 07/08/2021, 1:55 PM  [facility] [address] [place], [place], [address] Phone: [phone]   Fax:  [phone]  Name: [name] Page MRN: [id number] Date of Birth: 05-10-1951

## 2021-07-13 ENCOUNTER — Other Ambulatory Visit: Payer: Self-pay

## 2021-07-13 ENCOUNTER — Ambulatory Visit: Payer: Medicare Other | Admitting: Physical Therapy

## 2021-07-13 ENCOUNTER — Encounter: Payer: Self-pay | Admitting: Physical Therapy

## 2021-07-13 DIAGNOSIS — M25511 Pain in right shoulder: Secondary | ICD-10-CM | POA: Diagnosis not present

## 2021-07-13 DIAGNOSIS — M25611 Stiffness of right shoulder, not elsewhere classified: Secondary | ICD-10-CM | POA: Diagnosis not present

## 2021-07-13 DIAGNOSIS — M6281 Muscle weakness (generalized): Secondary | ICD-10-CM | POA: Diagnosis not present

## 2021-07-13 NOTE — Therapy (Signed)
[facility] [place], [place], [address] Phone: [phone]   Fax:  [phone]  Physical Therapy Treatment  Patient Details  Name: [name] Page MRN: [id number] Date of Birth: March 06, 1951 Referring Provider (PT): Larena Glassman MD   Encounter Date: 07/13/2021   PT End of Session - 07/13/21 1419     Visit Number 6    Number of Visits 12    Date for PT Re-Evaluation 09/23/21    Authorization Type FOTO AT LEAST EVERY 5TH VISIT.  PROGRESS NOTE AT 10TH VISIT.  KX MODIFIER AFTER 15 VISITS.    PT Start Time 1350    PT Stop Time 1428    PT Time Calculation (min) 38 min    Activity Tolerance Patient tolerated treatment well    Behavior During Therapy WFL for tasks assessed/performed             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   sees Dr Allyson Sabal   Cancer [facility])    melanoma   Dysuria    Frequency of urination    Hematuria    History of kidney stones    Meningioma ([place]) 09/04/2020   Osteoporosis    Renal calculus, bilateral    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   Urgency of urination    Wears glasses     Past Surgical History:  Procedure Laterality Date   APPLICATION OF CRANIAL NAVIGATION Right 09/11/2020   Procedure: APPLICATION OF CRANIAL NAVIGATION;  Surgeon: Vallarie Mare, MD;  Location: [place];  Service: Neurosurgery;  Laterality: Right;   CRANIOTOMY Right 09/11/2020   Procedure: Right Pterional Craniotomy for resection of anterior skull base mass with Brain Lab;  Surgeon: Vallarie Mare, MD;  Location: [place];  Service: Neurosurgery;  Laterality: Right;   CYSTOSCOPY WITH RETROGRADE PYELOGRAM, URETEROSCOPY AND STENT PLACEMENT Bilateral 08/11/2013   Procedure: CYSTOSCOPY WITH RETROGRADE PYELOGRAM, URETEROSCOPY AND STENT PLACEMENT;  Surgeon: Alexis Frock, MD;  [facility];  Service: Urology;   Laterality: Bilateral;   CYSTOSCOPY WITH URETEROSCOPY AND STENT PLACEMENT Bilateral 08/29/2013   Procedure: CYSTOSCOPY WITH BILATERAL URETEROSCOPY AND STENT REPLACEMENTS, stone extraction;  Surgeon: Alexis Frock, MD;  Location: [facility];  Service: Urology;  Laterality: Bilateral;   CYSTOSCOPY/RETROGRADE/URETEROSCOPY/STONE EXTRACTION WITH BASKET Bilateral 01/31/2016   Procedure: CYSTOSCOPY/BILATERAL RETROGRADE PYELOGRAMS/BILATERAL URETEROSCOPY/BILATERAL STONE EXTRACTION WITH BASKET/RIGHT URETERAL STENT PLACEMENT;  Surgeon: Raynelle Bring, MD;  [facility];  Service: Urology;  Laterality: Bilateral;   HEMIARTHROPLASTY SHOULDER FRACTURE Left 1992   HOLMIUM LASER APPLICATION Bilateral 25/85/2778   Procedure: HOLMIUM LASER APPLICATION;  Surgeon: Alexis Frock, MD;  Location: [facility];  Service: Urology;  Laterality: Bilateral;    There were no vitals filed for this visit.   Subjective Assessment - 07/13/21 1418     Subjective COVID-19 screen performed prior to patient entering clinic.    Pertinent History Left TSA, OP, OA, Basal CA, s/p craniotomy.    Patient Stated Goals Try to avoid surgery.    Currently in Pain? Yes    Pain Score 2     Pain Location Shoulder    Pain Orientation Right    Pain Descriptors / Indicators Sore    Pain Type Acute pain    Pain Onset More than a month ago    Pain Frequency Constant  [facility] PT Assessment - 07/13/21 0001       Assessment   Medical Diagnosis Closed fracture of proximal end of right humerus.    Referring Provider (PT) Larena Glassman MD    Onset Date/Surgical Date 05/06/21    Next MD Visit 07/29/2021      Precautions   Precaution Comments OP.  No left UE weight bearing.                           [facility] Adult PT Treatment/Exercise - 07/13/21 0001       Modalities   Modalities Electrical Stimulation;Moist Heat      Moist Heat Therapy   Number Minutes Moist Heat 10  Minutes    Moist Heat Location Shoulder      Electrical Stimulation   Electrical Stimulation Location R shoulder    Electrical Stimulation Action Pre-Mod    Electrical Stimulation Parameters 80-150 hz x10 min    Electrical Stimulation Goals Pain      Manual Therapy   Manual Therapy Passive ROM    Passive ROM In supine: P/AAROM of right shoulder into flex, ER, IR,                          PT Long Term Goals - 07/13/21 1421       PT LONG TERM GOAL #1   Title Independent with a HEP.    Time 6    Period Weeks    Status Achieved      PT LONG TERM GOAL #2   Title Active right shoulder flexion to 135 degrees so the patient can easily reach overhead.    Time 6    Period Weeks    Status On-going      PT LONG TERM GOAL #3   Title Active ER to 70 degrees+ to allow for easily donning/doffing of apparel.    Time 6    Period Weeks    Status On-going      PT LONG TERM GOAL #4   Title Perform ADL's with pain not > 2-3/10.    Time 6    Period Weeks    Status On-going                   Plan - 07/13/21 1425     Clinical Impression Statement Patient presented in clinic with reports of minimal shoulder pain. Patient able to tolerate P/AAROM of R shoulder into flex, ER/IR with gentle holds at end range. Firm end feels and smooth arc of motion noted during P/AAROM of R shoulder. Patient states that she used her RUE when getting items while she was at the grocery store. Patient reports compliance with HEP. Normal modalities response noted following removal of the modalities.    Personal Factors and Comorbidities Comorbidity 1;Comorbidity 2;Other    Comorbidities Left TSA, OP, OA, Basal CA, s/p craniotomy.    Examination-Activity Limitations Other    Examination-Participation Restrictions Other;Laundry    Stability/Clinical Decision Making Stable/Uncomplicated    Rehab Potential Good    PT Frequency 2x / week    PT Duration 6 weeks    PT Treatment/Interventions  ADLs/Self Care Home Management;Cryotherapy;Electrical Stimulation;Moist Heat;Therapeutic activities;Therapeutic exercise;Manual techniques;Patient/family education    PT Next Visit Plan Review HEP.  Begin with gentle right shoulder P/AROM.  Progress to seated UE Ranger.  Okay to begin supine cane exercises.    Consulted and Agree with Plan of  Care Patient             Patient will benefit from skilled therapeutic intervention in order to improve the following deficits and impairments:  Pain, Decreased activity tolerance, Decreased range of motion  Visit Diagnosis: Acute pain of right shoulder  Stiffness of right shoulder, not elsewhere classified     Problem List Patient Active Problem List   Diagnosis Date Noted   History of resection of meningioma 11/02/2020   Peripheral edema    Hypoalbuminemia due to protein-calorie malnutrition (HCC)    Transaminitis    Tremor    Seizure prophylaxis    Iron deficiency anemia    CKD (chronic kidney disease), stage III ([place]) 09/10/2020   Vasogenic Cerebral edema ([place]) 09/04/2020   Cancer of skin, squamous cell 09/04/2020   Nephrolithiasis 09/04/2020   Essential hypertension 11/01/2015   Osteoporosis 10/30/2015    Standley Brooking, PTA 07/13/2021, 2:45 PM  [facility] [address] [place], [place], [address] Phone: [phone]   Fax:  [phone]  Name: [name] Page MRN: [id number] Date of Birth: 01-Jun-1951

## 2021-07-15 ENCOUNTER — Other Ambulatory Visit: Payer: Self-pay

## 2021-07-15 ENCOUNTER — Ambulatory Visit: Payer: Medicare Other

## 2021-07-15 DIAGNOSIS — M25511 Pain in right shoulder: Secondary | ICD-10-CM

## 2021-07-15 DIAGNOSIS — M6281 Muscle weakness (generalized): Secondary | ICD-10-CM | POA: Diagnosis not present

## 2021-07-15 DIAGNOSIS — M25611 Stiffness of right shoulder, not elsewhere classified: Secondary | ICD-10-CM

## 2021-07-15 NOTE — Therapy (Signed)
[facility] [place], [place], [address] Phone: [phone]   Fax:  [phone]  Physical Therapy Treatment  Patient Details  Name: [name] Page MRN: [id number] Date of Birth: [birth year] Referring Provider (PT): Larena Glassman MD   Encounter Date: 07/15/2021   PT End of Session - 07/15/21 1351     Visit Number 7    Number of Visits 12    Date for PT Re-Evaluation 09/23/21    Authorization Type FOTO AT LEAST EVERY 5TH VISIT.  PROGRESS NOTE AT 10TH VISIT.  KX MODIFIER AFTER 15 VISITS.    PT Start Time 1348    PT Stop Time 1438    PT Time Calculation (min) 50 min    Activity Tolerance Patient tolerated treatment well    Behavior During Therapy WFL for tasks assessed/performed             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   sees Dr Allyson Sabal   Cancer [facility])    melanoma   Dysuria    Frequency of urination    Hematuria    History of kidney stones    Meningioma ([place]) 09/04/2020   Osteoporosis    Renal calculus, bilateral    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   Urgency of urination    Wears glasses     Past Surgical History:  Procedure Laterality Date   APPLICATION OF CRANIAL NAVIGATION Right 09/11/2020   Procedure: APPLICATION OF CRANIAL NAVIGATION;  Surgeon: Vallarie Mare, MD;  Location: [place];  Service: Neurosurgery;  Laterality: Right;   CRANIOTOMY Right 09/11/2020   Procedure: Right Pterional Craniotomy for resection of anterior skull base mass with Brain Lab;  Surgeon: Vallarie Mare, MD;  Location: [place];  Service: Neurosurgery;  Laterality: Right;   CYSTOSCOPY WITH RETROGRADE PYELOGRAM, URETEROSCOPY AND STENT PLACEMENT Bilateral 08/11/2013   Procedure: CYSTOSCOPY WITH RETROGRADE PYELOGRAM, URETEROSCOPY AND STENT PLACEMENT;  Surgeon: Alexis Frock, MD;  [facility];  Service: Urology;   Laterality: Bilateral;   CYSTOSCOPY WITH URETEROSCOPY AND STENT PLACEMENT Bilateral 08/29/2013   Procedure: CYSTOSCOPY WITH BILATERAL URETEROSCOPY AND STENT REPLACEMENTS, stone extraction;  Surgeon: Alexis Frock, MD;  Location: [facility];  Service: Urology;  Laterality: Bilateral;   CYSTOSCOPY/RETROGRADE/URETEROSCOPY/STONE EXTRACTION WITH BASKET Bilateral 01/31/2016   Procedure: CYSTOSCOPY/BILATERAL RETROGRADE PYELOGRAMS/BILATERAL URETEROSCOPY/BILATERAL STONE EXTRACTION WITH BASKET/RIGHT URETERAL STENT PLACEMENT;  Surgeon: Raynelle Bring, MD;  [facility];  Service: Urology;  Laterality: Bilateral;   HEMIARTHROPLASTY SHOULDER FRACTURE Left 1992   HOLMIUM LASER APPLICATION Bilateral 37/90/2409   Procedure: HOLMIUM LASER APPLICATION;  Surgeon: Alexis Frock, MD;  Location: [facility];  Service: Urology;  Laterality: Bilateral;    There were no vitals filed for this visit.   Subjective Assessment - 07/15/21 1349     Subjective COVID-19 screen performed prior to patient entering clinic.  Pt reported mild R shoulder soreness upon arriving for today's treatment session.    Pertinent History Left TSA, OP, OA, Basal CA, s/p craniotomy.    Patient Stated Goals Try to avoid surgery.    Currently in Pain? Yes    Pain Score 2     Pain Location Shoulder    Pain Orientation Right    Pain Descriptors / Indicators Sore    Pain Onset More than a month ago  [facility] PT Assessment - 07/15/21 0001       ROM / Strength   AROM / PROM / Strength PROM      PROM   Overall PROM Comments In supine, genter P/AROM flexion 120 degrees and 70 degrees of ER                           [facility] Adult PT Treatment/Exercise - 07/15/21 0001       Shoulder Exercises: Standing   Other Standing Exercises Wall ladder x 10   Flex, cues to avoid compensatory movements of right shoulder   Other Standing Exercises Wall wash x 2 mins      Shoulder  Exercises: ROM/Strengthening   Ranger Seated   Forward/Backward, Left/Right, CW/CCW circles x 3 mins each     Modalities   Modalities Electrical Stimulation;Moist Heat      Moist Heat Therapy   Number Minutes Moist Heat 15 Minutes    Moist Heat Location Shoulder      Electrical Stimulation   Electrical Stimulation Location R shoulder    Electrical Stimulation Action Pre-Mod    Electrical Stimulation Parameters 80-150 Hz x 15 mins    Electrical Stimulation Goals Pain      Manual Therapy   Manual Therapy Passive ROM    Passive ROM Supine, PROM/AAROM into flex, ER, IR                          PT Long Term Goals - 07/13/21 1421       PT LONG TERM GOAL #1   Title Independent with a HEP.    Time 6    Period Weeks    Status Achieved      PT LONG TERM GOAL #2   Title Active right shoulder flexion to 135 degrees so the patient can easily reach overhead.    Time 6    Period Weeks    Status On-going      PT LONG TERM GOAL #3   Title Active ER to 70 degrees+ to allow for easily donning/doffing of apparel.    Time 6    Period Weeks    Status On-going      PT LONG TERM GOAL #4   Title Perform ADL's with pain not > 2-3/10.    Time 6    Period Weeks    Status On-going                   Plan - 07/15/21 1351     Clinical Impression Statement Pt reporting minimal pain right shoulder pain, 2/10, upon arriving for today's treamtent session.  Pt able to tolerate addition of wall ladder well, with min cues to avoid compensatory movements with right shoulder.  Pt is able to use her RUE to vacuum at home without issue.  Pt reports performing HEP as instructed.  Pt reported 0/10 R shoulder pain at completion of today's treatment session.  Pt would benefit from continuation of current POC to address limitations and assist with return to PLOF.    Personal Factors and Comorbidities Comorbidity 1;Comorbidity 2;Other    Comorbidities Left TSA, OP, OA, Basal CA, s/p  craniotomy.    Examination-Activity Limitations Other    Examination-Participation Restrictions Other;Laundry    Stability/Clinical Decision Making Stable/Uncomplicated    Rehab Potential Good    PT Frequency 2x / week    PT Duration 6 weeks  PT Treatment/Interventions ADLs/Self Care Home Management;Cryotherapy;Electrical Stimulation;Moist Heat;Therapeutic activities;Therapeutic exercise;Manual techniques;Patient/family education    PT Next Visit Plan Review HEP.  Begin with gentle right shoulder P/AROM.  Progress to seated UE Ranger.  Okay to begin supine cane exercises.    Consulted and Agree with Plan of Care Patient             Patient will benefit from skilled therapeutic intervention in order to improve the following deficits and impairments:  Pain, Decreased activity tolerance, Decreased range of motion  Visit Diagnosis: Acute pain of right shoulder  Stiffness of right shoulder, not elsewhere classified     Problem List Patient Active Problem List   Diagnosis Date Noted   History of resection of meningioma 11/02/2020   Peripheral edema    Hypoalbuminemia due to protein-calorie malnutrition (HCC)    Transaminitis    Tremor    Seizure prophylaxis    Iron deficiency anemia    CKD (chronic kidney disease), stage III ([place]) 09/10/2020   Vasogenic Cerebral edema ([place]) 09/04/2020   Cancer of skin, squamous cell 09/04/2020   Nephrolithiasis 09/04/2020   Essential hypertension 11/01/2015   Osteoporosis 10/30/2015    Kathrynn Ducking, PTA 07/15/2021, 2:46 PM  [facility] [address] [place], [place], [address] Phone: [phone]   Fax:  [phone]  Name: [name] Page MRN: [id number] Date of Birth: [birth year]

## 2021-07-20 ENCOUNTER — Ambulatory Visit (INDEPENDENT_AMBULATORY_CARE_PROVIDER_SITE_OTHER): Payer: Medicare Other | Admitting: Family Medicine

## 2021-07-20 ENCOUNTER — Other Ambulatory Visit: Payer: Self-pay | Admitting: Family Medicine

## 2021-07-20 ENCOUNTER — Encounter: Payer: Self-pay | Admitting: Physical Therapy

## 2021-07-20 ENCOUNTER — Ambulatory Visit: Payer: Medicare Other | Admitting: Physical Therapy

## 2021-07-20 ENCOUNTER — Other Ambulatory Visit: Payer: Self-pay

## 2021-07-20 VITALS — BP 134/73 | HR 70 | Temp 97.7°F | Ht 62.0 in | Wt 114.8 lb

## 2021-07-20 DIAGNOSIS — M25611 Stiffness of right shoulder, not elsewhere classified: Secondary | ICD-10-CM

## 2021-07-20 DIAGNOSIS — M810 Age-related osteoporosis without current pathological fracture: Secondary | ICD-10-CM

## 2021-07-20 DIAGNOSIS — I10 Essential (primary) hypertension: Secondary | ICD-10-CM | POA: Diagnosis not present

## 2021-07-20 DIAGNOSIS — Z23 Encounter for immunization: Secondary | ICD-10-CM

## 2021-07-20 DIAGNOSIS — M6281 Muscle weakness (generalized): Secondary | ICD-10-CM | POA: Diagnosis not present

## 2021-07-20 DIAGNOSIS — M25511 Pain in right shoulder: Secondary | ICD-10-CM | POA: Diagnosis not present

## 2021-07-20 DIAGNOSIS — E782 Mixed hyperlipidemia: Secondary | ICD-10-CM

## 2021-07-20 DIAGNOSIS — R251 Tremor, unspecified: Secondary | ICD-10-CM | POA: Diagnosis not present

## 2021-07-20 MED ORDER — RISEDRONATE SODIUM 150 MG PO TABS
150.0000 mg | ORAL_TABLET | ORAL | 4 refills | Status: DC
Start: 2021-07-20 — End: 2021-07-29

## 2021-07-20 NOTE — Therapy (Signed)
[facility] [place], [place], [address] Phone: [phone]   Fax:  [phone]  Physical Therapy Treatment  Patient Details  Name: [name] Page MRN: [id number] Date of Birth: 08-Aug-1951 Referring Provider (PT): Larena Glassman MD   Encounter Date: 07/20/2021   PT End of Session - 07/20/21 1347     Visit Number 8    Number of Visits 12    Date for PT Re-Evaluation 09/23/21    Authorization Type FOTO AT LEAST EVERY 5TH VISIT.  PROGRESS NOTE AT 10TH VISIT.  KX MODIFIER AFTER 15 VISITS.    PT Start Time 1345    PT Stop Time 1428    PT Time Calculation (min) 43 min    Activity Tolerance Patient tolerated treatment well    Behavior During Therapy WFL for tasks assessed/performed             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   sees Dr Allyson Sabal   Cancer [facility])    melanoma   Dysuria    Frequency of urination    Hematuria    History of kidney stones    Meningioma ([place]) 09/04/2020   Osteoporosis    Renal calculus, bilateral    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   Urgency of urination    Wears glasses     Past Surgical History:  Procedure Laterality Date   APPLICATION OF CRANIAL NAVIGATION Right 09/11/2020   Procedure: APPLICATION OF CRANIAL NAVIGATION;  Surgeon: Vallarie Mare, MD;  Location: [place];  Service: Neurosurgery;  Laterality: Right;   CRANIOTOMY Right 09/11/2020   Procedure: Right Pterional Craniotomy for resection of anterior skull base mass with Brain Lab;  Surgeon: Vallarie Mare, MD;  Location: [place];  Service: Neurosurgery;  Laterality: Right;   CYSTOSCOPY WITH RETROGRADE PYELOGRAM, URETEROSCOPY AND STENT PLACEMENT Bilateral 08/11/2013   Procedure: CYSTOSCOPY WITH RETROGRADE PYELOGRAM, URETEROSCOPY AND STENT PLACEMENT;  Surgeon: Alexis Frock, MD;  [facility];  Service: Urology;   Laterality: Bilateral;   CYSTOSCOPY WITH URETEROSCOPY AND STENT PLACEMENT Bilateral 08/29/2013   Procedure: CYSTOSCOPY WITH BILATERAL URETEROSCOPY AND STENT REPLACEMENTS, stone extraction;  Surgeon: Alexis Frock, MD;  Location: [facility];  Service: Urology;  Laterality: Bilateral;   CYSTOSCOPY/RETROGRADE/URETEROSCOPY/STONE EXTRACTION WITH BASKET Bilateral 01/31/2016   Procedure: CYSTOSCOPY/BILATERAL RETROGRADE PYELOGRAMS/BILATERAL URETEROSCOPY/BILATERAL STONE EXTRACTION WITH BASKET/RIGHT URETERAL STENT PLACEMENT;  Surgeon: Raynelle Bring, MD;  [facility];  Service: Urology;  Laterality: Bilateral;   HEMIARTHROPLASTY SHOULDER FRACTURE Left 1992   HOLMIUM LASER APPLICATION Bilateral 00/17/4944   Procedure: HOLMIUM LASER APPLICATION;  Surgeon: Alexis Frock, MD;  Location: [facility];  Service: Urology;  Laterality: Bilateral;    There were no vitals filed for this visit.   Subjective Assessment - 07/20/21 1347     Subjective COVID-19 screen performed prior to patient entering clinic. Reports R shoulder has been doing "good."    Pertinent History Left TSA, OP, OA, Basal CA, s/p craniotomy.    Patient Stated Goals Try to avoid surgery.    Currently in Pain? No/denies                [facility] PT Assessment - 07/20/21 0001       Assessment   Medical Diagnosis Closed fracture of proximal end of right humerus.    Referring Provider (PT) Larena Glassman MD  Onset Date/Surgical Date 05/06/21    Next MD Visit 07/29/2021      Precautions   Precaution Comments OP.  No left UE weight bearing.      ROM / Strength   AROM / PROM / Strength AROM      AROM   Overall AROM  Within functional limits for tasks performed    AROM Assessment Site Shoulder    Right/Left Shoulder Right    Right Shoulder External Rotation 71 Degrees                           [facility] Adult PT Treatment/Exercise - 07/20/21 0001       Shoulder Exercises: Supine    Protraction AAROM;Both;15 reps    External Rotation AAROM;Right;20 reps    Flexion AAROM;Both;15 reps      Shoulder Exercises: Standing   Other Standing Exercises RUE bolster slide into flexion on high plinth/ two pillows under x20 reps      Shoulder Exercises: Pulleys   Flexion 5 minutes      Shoulder Exercises: ROM/Strengthening   Ranger Seated; flex/ext x2 min, circles x2 min      Modalities   Modalities Electrical Stimulation;Moist Heat      Moist Heat Therapy   Number Minutes Moist Heat 10 Minutes    Moist Heat Location Shoulder      Electrical Stimulation   Electrical Stimulation Location R shoulder    Electrical Stimulation Action Pre-Mod    Electrical Stimulation Parameters 80-150 hz x10 min    Electrical Stimulation Goals Pain      Manual Therapy   Manual Therapy Passive ROM    Passive ROM Supine, PROM/AAROM into flex, ER, IR                          PT Long Term Goals - 07/20/21 1438       PT LONG TERM GOAL #1   Title Independent with a HEP.    Time 6    Period Weeks    Status Achieved      PT LONG TERM GOAL #2   Title Active right shoulder flexion to 135 degrees so the patient can easily reach overhead.    Time 6    Period Weeks    Status On-going      PT LONG TERM GOAL #3   Title Active ER to 70 degrees+ to allow for easily donning/doffing of apparel.    Time 6    Period Weeks    Status Achieved      PT LONG TERM GOAL #4   Title Perform ADL's with pain not > 2-3/10.    Time 6    Period Weeks    Status On-going                   Plan - 07/20/21 1424     Clinical Impression Statement Patient presented in clinic with no pain but minimal soreness. Patient progressed through more light AROM exercises in antigravity. More difficulty via chronic L shoulder debility from previous injury and surgery. Intermittant facial grimacing noted with PROM of R shoulder especially at end range flexion. Firm end feels and smooth arc of  motion noted during PROM of R shoulder. Normal modalities response noted following removal of the modalities.    Personal Factors and Comorbidities Comorbidity 1;Comorbidity 2;Other    Comorbidities Left TSA, OP, OA, Basal CA, s/p craniotomy.  Examination-Activity Limitations Other    Examination-Participation Restrictions Other;Laundry    Stability/Clinical Decision Making Stable/Uncomplicated    Rehab Potential Good    PT Frequency 2x / week    PT Duration 6 weeks    PT Treatment/Interventions ADLs/Self Care Home Management;Cryotherapy;Electrical Stimulation;Moist Heat;Therapeutic activities;Therapeutic exercise;Manual techniques;Patient/family education    PT Next Visit Plan Review HEP.  Begin with gentle right shoulder P/AROM.  Progress to seated UE Ranger.  Okay to begin supine cane exercises.    Consulted and Agree with Plan of Care Patient             Patient will benefit from skilled therapeutic intervention in order to improve the following deficits and impairments:  Pain, Decreased activity tolerance, Decreased range of motion  Visit Diagnosis: Acute pain of right shoulder  Stiffness of right shoulder, not elsewhere classified  Muscle weakness (generalized)     Problem List Patient Active Problem List   Diagnosis Date Noted   History of resection of meningioma 11/02/2020   Peripheral edema    Hypoalbuminemia due to protein-calorie malnutrition (HCC)    Transaminitis    Tremor    Seizure prophylaxis    Iron deficiency anemia    CKD (chronic kidney disease), stage III ([place]) 09/10/2020   Vasogenic Cerebral edema ([place]) 09/04/2020   Cancer of skin, squamous cell 09/04/2020   Nephrolithiasis 09/04/2020   Essential hypertension 11/01/2015   Osteoporosis 10/30/2015    Standley Brooking, PTA 07/20/2021, 2:38 PM  [facility] [address] [place], [place], [address] Phone: [phone]   Fax:  [phone]  Name:  [name] Page MRN: [id number] Date of Birth: [birth year]

## 2021-07-20 NOTE — Progress Notes (Signed)
Subjective: CC: Hypertension PCP: [name] Norlander, DO [name] Page:UXNATF [name] Page is a 70 y.o. female presenting to clinic today for:  1.  Hypertension Patient here for recheck of hypertension now that she is been off of medication for a month.  She seems to be doing relatively well.  No reports of chest pain, shortness of breath.  2.  Tremor Continues to struggle with tremor.  Beta-blocker was not helpful and therefore that was discontinued.  She has not seen anybody except for her neurosurgeon.  Has not reached out to him about this again.  3.  Osteoporosis Previously treated with bisphosphonate.  She is been off since January.  Wants know she can restart this.  Would look to go to once monthly if possible.  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   sees Dr [name] Page   Cancer [name])    melanoma   Dysuria    Frequency of urination    Hematuria    History of kidney stones    Meningioma ([place]) 09/04/2020   Osteoporosis    Renal calculus, bilateral    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   Urgency of urination    Wears glasses     Current Outpatient Medications:    acetaminophen (TYLENOL) 500 MG tablet, Take 500 mg by mouth every 6 (six) hours as needed., Disp: , Rfl:    atorvastatin (LIPITOR) 20 MG tablet, TAKE 1 TABLET BY MOUTH EVERY DAY, Disp: 90 tablet, Rfl: 1   Calcium Carbonate-Vit D-Min (CALCIUM 1200 PO), Take 1 tablet by mouth daily., Disp: , Rfl:    cetirizine (ZYRTEC) 10 MG tablet, Take 10 mg by mouth daily., Disp: , Rfl:    cyclobenzaprine (FLEXERIL) 5 MG tablet, Take 1 tablet (5 mg total) by mouth 3 (three) times daily as needed for muscle spasms., Disp: 30 tablet, Rfl: 0   ERIVEDGE 150 MG capsule, Take 150 mg by mouth daily., Disp: , Rfl:    escitalopram (LEXAPRO) 10 MG tablet, TAKE 1 TABLET BY MOUTH  EVERY DAY, Disp: 90 tablet, Rfl: 1   Glycerin-Hypromellose-PEG 400 0.2-0.2-1 % SOLN, Apply 1 drop to eye 2 (two) times daily as needed (dry eyes). , Disp: , Rfl:    ibuprofen (ADVIL) 200 MG tablet, Take 200 mg by mouth every 6 (six) hours as needed., Disp: , Rfl:    Multiple Vitamin (MULTIVITAMIN WITH MINERALS) TABS tablet, Take 1 tablet by mouth daily., Disp: , Rfl:  Social History   Socioeconomic History   Marital status: Divorced    Spouse name: Not on file   Number of children: 1   Years of education: 12   Highest education level: Not on file  Occupational History   Occupation: retired  Tobacco Use   Smoking status: Never   Smokeless tobacco: Never  Vaping Use   Vaping Use: Never used  Substance and Sexual Activity   Alcohol use: No   Drug use: No   Sexual activity: Not Currently  Other Topics Concern   Not on file  Social History Narrative   Patient resides locally.  She is currently separated from her spouse.  She is living in a mobile home near her daughter and 2 grandchildren and her father.   Social Determinants of Radio broadcast assistant  Strain: Low Risk    Difficulty of Paying Living Expenses: Not hard at all  Food Insecurity: No Food Insecurity   Worried About Charity fundraiser in the Last Year: Never true   Ran Out of Food in the Last Year: Never true  Transportation Needs: No Transportation Needs   Lack of Transportation (Medical): No   Lack of Transportation (Non-Medical): No  Physical Activity: Sufficiently Active   Days of Exercise per Week: 7 days   Minutes of Exercise per Session: 30 min  Stress: No Stress Concern Present   Feeling of Stress : Not at all  Social Connections: Moderately Integrated   Frequency of Communication with Friends and Family: More than three times a week   Frequency of Social Gatherings with Friends and Family: More than three times a week   Attends Religious Services: More than 4 times per year   Active Member of [employer]  or Organizations: Yes   Attends Music therapist: More than 4 times per year   Marital Status: Separated  Intimate Partner Violence: Not At Risk   Fear of Current or Ex-Partner: No   Emotionally Abused: No   Physically Abused: No   Sexually Abused: No   Family History  Problem Relation Age of Onset   Cancer Mother        unknown source, stomach and liver    Atrial fibrillation Father    Hyperlipidemia Father    Hypertension Father    Colon polyps Father    Hypertension Sister    Hyperlipidemia Sister    Thyroid disease Sister    Esophageal cancer Neg Hx    Pancreatic cancer Neg Hx     Objective: Office vital signs reviewed. BP 134/73   Pulse 70   Temp 97.7 F (36.5 C)   Ht '5\' 2"'  (1.575 m)   Wt 114 lb 12.8 oz (52.1 kg)   SpO2 96%   BMI 21.00 kg/m   Physical Examination:  General: Awake, alert, well nourished, No acute distress HEENT: Normal; sclera white Cardio: regular rate and rhythm, S1S2 heard, no murmurs appreciated Pulm: clear to auscultation bilaterally, no wheezes, rhonchi or rales; normal work of breathing on room air Neuro: Mild tremor appreciated  Assessment/ Plan: 70 y.o. female   Essential hypertension - Plan: CMP14+EGFR  Need for immunization against influenza - Plan: Flu Vaccine QUAD High Dose(Fluad)  Hyperlipemia, mixed - Plan: CMP14+EGFR, Lipid panel  Tremor  Age-related osteoporosis without current pathological fracture - Plan: CMP14+EGFR, VITAMIN D 25 Hydroxy (Vit-D Deficiency, Fractures), risedronate (ACTONEL) 150 MG tablet  Blood pressure is controlled.  Fine to stay off of antihypertensives  Influenza vaccination administered  Check fasting lipid panel, CMP.  Continue Lipitor  I have reached out to [name] Page specialist with regards to her tremor.  Wonder if she might benefit from primidone since she has failed beta-blocker.  She does have history of meningeal resection for meningioma and unsure if oral medications will  even impact postsurgical tremor  Start Actonel.  Check vitamin D level  Orders Placed This Encounter  Procedures   Flu Vaccine QUAD High Dose(Fluad)   No orders of the defined types were placed in this encounter.    [name] Norlander, DO [place] [phone]

## 2021-07-22 ENCOUNTER — Other Ambulatory Visit: Payer: Self-pay

## 2021-07-22 ENCOUNTER — Ambulatory Visit: Payer: Medicare Other | Attending: Orthopedic Surgery

## 2021-07-22 DIAGNOSIS — M25612 Stiffness of left shoulder, not elsewhere classified: Secondary | ICD-10-CM | POA: Diagnosis not present

## 2021-07-22 DIAGNOSIS — M25511 Pain in right shoulder: Secondary | ICD-10-CM | POA: Diagnosis not present

## 2021-07-22 DIAGNOSIS — M6281 Muscle weakness (generalized): Secondary | ICD-10-CM | POA: Insufficient documentation

## 2021-07-22 DIAGNOSIS — M25611 Stiffness of right shoulder, not elsewhere classified: Secondary | ICD-10-CM | POA: Insufficient documentation

## 2021-07-22 NOTE — Therapy (Signed)
[facility] [place], [place], [address] Phone: [phone]   Fax:  [phone]  Physical Therapy Treatment  Patient Details  Name: [name] Page MRN: [id number] Date of Birth: 07-08-1951 Referring Provider (PT): Larena Glassman MD   Encounter Date: 07/22/2021   PT End of Session - 07/22/21 1355     Visit Number 9    Number of Visits 12    Date for PT Re-Evaluation 09/23/21    Authorization Type FOTO AT LEAST EVERY 5TH VISIT.  PROGRESS NOTE AT 10TH VISIT.  KX MODIFIER AFTER 15 VISITS.    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   sees Dr Allyson Sabal   Cancer [facility])    melanoma   Dysuria    Frequency of urination    Hematuria    History of kidney stones    Meningioma ([place]) 09/04/2020   Osteoporosis    Renal calculus, bilateral    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   Urgency of urination    Wears glasses     Past Surgical History:  Procedure Laterality Date   APPLICATION OF CRANIAL NAVIGATION Right 09/11/2020   Procedure: APPLICATION OF CRANIAL NAVIGATION;  Surgeon: Vallarie Mare, MD;  Location: [place];  Service: Neurosurgery;  Laterality: Right;   CRANIOTOMY Right 09/11/2020   Procedure: Right Pterional Craniotomy for resection of anterior skull base mass with Brain Lab;  Surgeon: Vallarie Mare, MD;  Location: [place];  Service: Neurosurgery;  Laterality: Right;   CYSTOSCOPY WITH RETROGRADE PYELOGRAM, URETEROSCOPY AND STENT PLACEMENT Bilateral 08/11/2013   Procedure: CYSTOSCOPY WITH RETROGRADE PYELOGRAM, URETEROSCOPY AND STENT PLACEMENT;  Surgeon: Alexis Frock, MD;  Location: [facility];  Service: Urology;   Laterality: Bilateral;   CYSTOSCOPY WITH URETEROSCOPY AND STENT PLACEMENT Bilateral 08/29/2013   Procedure: CYSTOSCOPY WITH BILATERAL URETEROSCOPY AND STENT REPLACEMENTS, stone extraction;  Surgeon: Alexis Frock, MD;  [facility];  Service: Urology;  Laterality: Bilateral;   CYSTOSCOPY/RETROGRADE/URETEROSCOPY/STONE EXTRACTION WITH BASKET Bilateral 01/31/2016   Procedure: CYSTOSCOPY/BILATERAL RETROGRADE PYELOGRAMS/BILATERAL URETEROSCOPY/BILATERAL STONE EXTRACTION WITH BASKET/RIGHT URETERAL STENT PLACEMENT;  Surgeon: Raynelle Bring, MD;  Location: [facility];  Service: Urology;  Laterality: Bilateral;   HEMIARTHROPLASTY SHOULDER FRACTURE Left 1992   HOLMIUM LASER APPLICATION Bilateral 12/87/8676   Procedure: HOLMIUM LASER APPLICATION;  Surgeon: Alexis Frock, MD;  Location: [facility];  Service: Urology;  Laterality: Bilateral;    There were no vitals filed for this visit.   Subjective Assessment - 07/22/21 1352     Subjective COVID-19 screen performed prior to patient entering clinic. Reports mild right shoulder soreness from Monday's treatment.    Pertinent History Left TSA, OP, OA, Basal CA, s/p craniotomy.    Patient Stated Goals Try to avoid surgery.    Currently in Pain? No/denies                               [facility] Adult PT Treatment/Exercise - 07/22/21 0001       Shoulder Exercises: Standing  Flexion AAROM;10 reps   Ladder walks; cues to avoid compensatory movements     Shoulder Exercises: Pulleys   Flexion 3 minutes      Shoulder Exercises: ROM/Strengthening   Nustep Lvl 3 x 10 mins    Ranger Seated; flex/ext, left/right, cw/ccw circles x 2 mins each      Modalities   Modalities Electrical Stimulation;Moist Heat      Moist Heat Therapy   Number Minutes Moist Heat 15 Minutes    Moist Heat Location Shoulder      Electrical Stimulation   Electrical Stimulation Location R shoulder    Electrical Stimulation Action  Pre-Mod    Electrical Stimulation Parameters 80-150 Hz x 15 mins    Electrical Stimulation Goals Pain      Manual Therapy   Manual Therapy Passive ROM    Passive ROM Supine, PROM/AAROM into flex, ER, IR                          PT Long Term Goals - 07/20/21 1438       PT LONG TERM GOAL #1   Title Independent with a HEP.    Time 6    Period Weeks    Status Achieved      PT LONG TERM GOAL #2   Title Active right shoulder flexion to 135 degrees so the patient can easily reach overhead.    Time 6    Period Weeks    Status On-going      PT LONG TERM GOAL #3   Title Active ER to 70 degrees+ to allow for easily donning/doffing of apparel.    Time 6    Period Weeks    Status Achieved      PT LONG TERM GOAL #4   Title Perform ADL's with pain not > 2-3/10.    Time 6    Period Weeks    Status On-going                   Plan - 07/22/21 1356     Clinical Impression Statement Pt arrived for today's treatment session reporting mild soreness in right shoulder, but no pain.  Pt able to tolerate addition of Nustep warm-up x 10 mins without issue or complaint.  Reports decreased tightness post Nustep.  Left shoulder debility from previous injury and surgery limits pt more than right.  Slight facial grimacing noted with PROM of right shoulder, especially end range of flexion.  Pt denied any pain at completion of today's treatment session.    Personal Factors and Comorbidities Comorbidity 1;Comorbidity 2;Other    Comorbidities Left TSA, OP, OA, Basal CA, s/p craniotomy.    Examination-Activity Limitations Other    Examination-Participation Restrictions Other;Laundry    Stability/Clinical Decision Making Stable/Uncomplicated    Rehab Potential Good    PT Frequency 2x / week    PT Duration 6 weeks    PT Treatment/Interventions ADLs/Self Care Home Management;Cryotherapy;Electrical Stimulation;Moist Heat;Therapeutic activities;Therapeutic exercise;Manual  techniques;Patient/family education    PT Next Visit Plan Review HEP.  Begin with gentle right shoulder P/AROM.  Progress to seated UE Ranger.  Okay to begin supine cane exercises.    Consulted and Agree with Plan of Care Patient             Patient will benefit from skilled therapeutic intervention in order to improve the following deficits and impairments:  Pain, Decreased activity tolerance, Decreased range of motion  Visit Diagnosis: Acute pain  of right shoulder  Stiffness of right shoulder, not elsewhere classified     Problem List Patient Active Problem List   Diagnosis Date Noted   History of resection of meningioma 11/02/2020   Peripheral edema    Hypoalbuminemia due to protein-calorie malnutrition (HCC)    Transaminitis    Tremor    Seizure prophylaxis    Iron deficiency anemia    CKD (chronic kidney disease), stage III ([place]) 09/10/2020   Vasogenic Cerebral edema ([place]) 09/04/2020   Cancer of skin, squamous cell 09/04/2020   Nephrolithiasis 09/04/2020   Essential hypertension 11/01/2015   Osteoporosis 10/30/2015    Kathrynn Ducking, PTA 07/22/2021, 2:44 PM  [facility] [address] [place], [place], [address] Phone: [phone]   Fax:  [phone]  Name: [name] Page MRN: [id number] Date of Birth: 03-Apr-1951

## 2021-07-27 ENCOUNTER — Other Ambulatory Visit: Payer: Self-pay

## 2021-07-27 ENCOUNTER — Ambulatory Visit: Payer: Medicare Other

## 2021-07-27 DIAGNOSIS — M6281 Muscle weakness (generalized): Secondary | ICD-10-CM | POA: Diagnosis not present

## 2021-07-27 DIAGNOSIS — M25511 Pain in right shoulder: Secondary | ICD-10-CM | POA: Diagnosis not present

## 2021-07-27 DIAGNOSIS — M25612 Stiffness of left shoulder, not elsewhere classified: Secondary | ICD-10-CM | POA: Diagnosis not present

## 2021-07-27 DIAGNOSIS — M25611 Stiffness of right shoulder, not elsewhere classified: Secondary | ICD-10-CM | POA: Diagnosis not present

## 2021-07-27 NOTE — Therapy (Addendum)
[facility] [name] Page, [place], [address] Phone: [phone]   Fax:  [phone]  Physical Therapy Treatment  Patient Details  Name: [name] Page MRN: [id number] Date of Birth: [birth year] Referring Provider (PT): Larena Glassman MD   Encounter Date: 07/27/2021   PT End of Session - 07/27/21 1353     Visit Number 10    Number of Visits 12    Date for PT Re-Evaluation 09/23/21    Authorization Type FOTO AT LEAST EVERY 5TH VISIT.  PROGRESS NOTE AT 10TH VISIT.  KX MODIFIER AFTER 15 VISITS.    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   sees Dr Allyson Sabal   Cancer [facility])    melanoma   Dysuria    Frequency of urination    Hematuria    History of kidney stones    Meningioma ([place]) 09/04/2020   Osteoporosis    Renal calculus, bilateral    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   Urgency of urination    Wears glasses     Past Surgical History:  Procedure Laterality Date   APPLICATION OF CRANIAL NAVIGATION Right 09/11/2020   Procedure: APPLICATION OF CRANIAL NAVIGATION;  Surgeon: Vallarie Mare, MD;  Location: [place];  Service: Neurosurgery;  Laterality: Right;   CRANIOTOMY Right 09/11/2020   Procedure: Right Pterional Craniotomy for resection of anterior skull base mass with Brain Lab;  Surgeon: Vallarie Mare, MD;  Location: [place];  Service: Neurosurgery;  Laterality: Right;   CYSTOSCOPY WITH RETROGRADE PYELOGRAM, URETEROSCOPY AND STENT PLACEMENT Bilateral 08/11/2013   Procedure: CYSTOSCOPY WITH RETROGRADE PYELOGRAM, URETEROSCOPY AND STENT PLACEMENT;  Surgeon: Alexis Frock, MD;  [facility];  Service: Urology;   Laterality: Bilateral;   CYSTOSCOPY WITH URETEROSCOPY AND STENT PLACEMENT Bilateral 08/29/2013   Procedure: CYSTOSCOPY WITH BILATERAL URETEROSCOPY AND STENT REPLACEMENTS, stone extraction;  Surgeon: Alexis Frock, MD;  Location: [facility];  Service: Urology;  Laterality: Bilateral;   CYSTOSCOPY/RETROGRADE/URETEROSCOPY/STONE EXTRACTION WITH BASKET Bilateral 01/31/2016   Procedure: CYSTOSCOPY/BILATERAL RETROGRADE PYELOGRAMS/BILATERAL URETEROSCOPY/BILATERAL STONE EXTRACTION WITH BASKET/RIGHT URETERAL STENT PLACEMENT;  Surgeon: Raynelle Bring, MD;  [facility];  Service: Urology;  Laterality: Bilateral;   HEMIARTHROPLASTY SHOULDER FRACTURE Left 1992   HOLMIUM LASER APPLICATION Bilateral 61/60/7371   Procedure: HOLMIUM LASER APPLICATION;  Surgeon: Alexis Frock, MD;  [facility];  Service: Urology;  Laterality: Bilateral;    There were no vitals filed for this visit.   Subjective Assessment - 07/27/21 1352     Subjective COVID-19 screen performed prior to patient entering clinic. Pt denies any shoulder pain upon arriving for today's treatment session.    Pertinent History Left TSA, OP, OA, Basal CA, s/p craniotomy.    Patient Stated Goals Try to avoid surgery.    Currently in Pain? No/denies                [facility] PT Assessment - 07/27/21 0001       Observation/Other Assessments   Focus on Therapeutic Outcomes (FOTO)  33%   07/27/21 10th visit  [facility] Adult PT Treatment/Exercise - 07/27/21 0001       Exercises   Exercises Shoulder      Shoulder Exercises: Standing   Other Standing Exercises Wall ladder x 10 with hold at top      Shoulder Exercises: Pulleys   Flexion 3 minutes      Shoulder Exercises: ROM/Strengthening   Nustep Lvl 3 x 10 mins      Modalities   Modalities Electrical Stimulation;Moist Heat      Moist Heat Therapy   Number Minutes Moist Heat 15 Minutes    Moist Heat  Location Shoulder      Electrical Stimulation   Electrical Stimulation Location R shoulder    Electrical Stimulation Action Pre-Mod    Electrical Stimulation Parameters 80-150 Hz x 15 mins      Manual Therapy   Manual Therapy Passive ROM    Passive ROM Supine P/AAROM of R shoulder into flex, ER, IR, with gentle holds at end range                          PT Long Term Goals - 07/27/21 1400       PT LONG TERM GOAL #1   Title Independent with a HEP.    Time 6    Period Weeks    Status Achieved      PT LONG TERM GOAL #2   Title Active right shoulder flexion to 135 degrees so the patient can easily reach overhead.    Baseline 07/27/21: 114 degrees of flexion    Time 6    Period Weeks    Status On-going      PT LONG TERM GOAL #3   Title Active ER to 70 degrees+ to allow for easily donning/doffing of apparel.    Time 6    Period Weeks    Status Achieved      PT LONG TERM GOAL #4   Title Perform ADL's with pain not > 2-3/10.    Time 6    Period Weeks    Status Achieved                   Plan - 07/27/21 1353     Clinical Impression Statement Pt arrives for today's treatment session without any pain.  Pt continues to limited by left shoulder debility from previous injury and surgery more so than right shoulder.  Pt has met all goals as of today's treatment except her active flexion goal.  Pt's active shoulder flexion 114 degrees today.  Pt denies any pain at completion of today's treatment session.  Pt would benefit from further shoulder flexion stretching and exercises to increase ROM.    Personal Factors and Comorbidities Comorbidity 1;Comorbidity 2;Other    Comorbidities Left TSA, OP, OA, Basal CA, s/p craniotomy.    Examination-Activity Limitations Other    Examination-Participation Restrictions Other;Laundry    Stability/Clinical Decision Making Stable/Uncomplicated    Rehab Potential Good    PT Frequency 2x / week    PT Duration 6 weeks    PT  Treatment/Interventions ADLs/Self Care Home Management;Cryotherapy;Electrical Stimulation;Moist Heat;Therapeutic activities;Therapeutic exercise;Manual techniques;Patient/family education    PT Next Visit Plan Review HEP.  Begin with gentle right shoulder P/AROM.  Progress to seated UE Ranger.  Okay to begin supine cane exercises.    Consulted and Agree with Plan of Care Patient             Patient will benefit from skilled   therapeutic intervention in order to improve the following deficits and impairments:  Pain, Decreased activity tolerance, Decreased range of motion  Visit Diagnosis: Acute pain of right shoulder  Stiffness of right shoulder, not elsewhere classified     Problem List Patient Active Problem List   Diagnosis Date Noted   History of resection of meningioma 11/02/2020   Peripheral edema    Hypoalbuminemia due to protein-calorie malnutrition (HCC)    Transaminitis    Tremor    Seizure prophylaxis    Iron deficiency anemia    CKD (chronic kidney disease), stage III ([place]) 09/10/2020   Vasogenic Cerebral edema ([place]) 09/04/2020   Cancer of skin, squamous cell 09/04/2020   Nephrolithiasis 09/04/2020   Essential hypertension 11/01/2015   Osteoporosis 10/30/2015    Kathrynn Ducking, PTA 07/27/2021, 2:39 PM  [facility] [address] [place], [place], [address] Phone: [phone]   Fax:  [phone]  Name: [name] Page MRN: [id number] Date of Birth: 1951-05-13   Progress Note Reporting Period 06/25/21 to 07/27/21.  See note below for Objective Data and Assessment of Progress/Goals. Very good progress toward goals.  Her range of motion and functional use of her right shoulder has improved nicely since beginning PT    Mali Applegate MPT

## 2021-07-28 DIAGNOSIS — R82991 Hypocitraturia: Secondary | ICD-10-CM | POA: Diagnosis not present

## 2021-07-29 ENCOUNTER — Encounter: Payer: Self-pay | Admitting: Orthopedic Surgery

## 2021-07-29 ENCOUNTER — Ambulatory Visit: Payer: Medicare Other

## 2021-07-29 ENCOUNTER — Ambulatory Visit (INDEPENDENT_AMBULATORY_CARE_PROVIDER_SITE_OTHER): Payer: Medicare Other | Admitting: Orthopedic Surgery

## 2021-07-29 ENCOUNTER — Other Ambulatory Visit: Payer: Self-pay

## 2021-07-29 ENCOUNTER — Telehealth: Payer: Self-pay | Admitting: Family Medicine

## 2021-07-29 ENCOUNTER — Other Ambulatory Visit: Payer: Self-pay | Admitting: Family Medicine

## 2021-07-29 VITALS — Ht 62.0 in | Wt 114.0 lb

## 2021-07-29 DIAGNOSIS — M25512 Pain in left shoulder: Secondary | ICD-10-CM

## 2021-07-29 DIAGNOSIS — S42201D Unspecified fracture of upper end of right humerus, subsequent encounter for fracture with routine healing: Secondary | ICD-10-CM

## 2021-07-29 DIAGNOSIS — M810 Age-related osteoporosis without current pathological fracture: Secondary | ICD-10-CM

## 2021-07-29 MED ORDER — ALENDRONATE SODIUM 70 MG PO TABS: 70.0000 mg | ORAL_TABLET | ORAL | 4 refills | Status: DC

## 2021-07-29 NOTE — Telephone Encounter (Signed)
Pt was prescribed risedronate (ACTONEL) 150 MG tablet but insurance will not cover, she would like to know if something else can be called in instead. Please call back and advise.

## 2021-07-29 NOTE — Progress Notes (Signed)
Orthopaedic Clinic Return  Assessment: [name] Page is a 70 y.o. female with the following: Right proximal humerus fracture; stable, continue nonoperative management  Plan: Radiographs stable.  Her pain is improving.  She has pretty good range of motion at this time.  She started develop left shoulder pain.  She has a history of left shoulder replacement in the 90s.  She would like to start some physical therapy for her left shoulder as well.  Referral placed today.  If she has issues with her left shoulder, and would like a further work-up, she can contact the clinic for follow-up.  Follow-up as needed.  Follow-up: Return if symptoms worsen or fail to improve.   Subjective:  Chief Complaint  Patient presents with   Fracture    Fx Care Rt shoulder DOI 05/06/21     History of Present Illness: [name] Page is a 70 y.o. female who returns to clinic for repeat evaluation of her right shoulder.  Injury was sustained approximately 3 months ago.  She has been working with physical therapy.  Her pain and range of motion is improving.  She is able to do most things with her right shoulder.  However, she started about pain in her left shoulder.  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  No numbness or tingling.  Review of Systems: No fevers or chills No numbness or tingling No chest pain No shortness of breath No bowel or bladder dysfunction No GI distress No headaches   Objective: Ht 5\' 2"  (1.575 m)   Wt 114 lb (51.7 kg)   BMI 20.85 kg/m   Physical Exam:  Alert and oriented, no acute distress.   Sensation intact in axillary nerve distribution. Sensation intact to right hand. Active motion in AIN/PIN/U nerve distribution. Fingers are warm and well perfused.  2+ radial pulse Forward elevation to 100 degrees. Abduction at her side to 90 degrees.  Internal rotation of lumbar spine. She can get her hand to her  mouth.  Limited motion of the left shoulder.  Pain with passive motion beyond 100 degrees of forward elevation.  IMAGING: I personally ordered and reviewed the following images:  X-rays of the right shoulder were obtained in clinic today, compared to previous x-rays.  There is been no interval displacement.  Overall alignment remains unchanged.  Humeral stem has been impacted into the articular surface.  Glenohumeral joint remains reduced.  No acute injuries.  Impression: Stable right proximal humerus fracture, unchanged from previous x-rays   [name] Rasmussen, MD 07/29/2021 10:12 PM

## 2021-07-29 NOTE — Telephone Encounter (Signed)
Boniva the only other once monthly but it is INFERIOR to her Fosamax so I am sending back in the fosamax.

## 2021-07-30 ENCOUNTER — Ambulatory Visit: Payer: Medicare Other

## 2021-07-30 DIAGNOSIS — M25611 Stiffness of right shoulder, not elsewhere classified: Secondary | ICD-10-CM | POA: Diagnosis not present

## 2021-07-30 DIAGNOSIS — M25612 Stiffness of left shoulder, not elsewhere classified: Secondary | ICD-10-CM | POA: Diagnosis not present

## 2021-07-30 DIAGNOSIS — M6281 Muscle weakness (generalized): Secondary | ICD-10-CM

## 2021-07-30 DIAGNOSIS — M25511 Pain in right shoulder: Secondary | ICD-10-CM

## 2021-07-30 NOTE — Therapy (Signed)
[facility] [place], [place], [address] Phone: [phone]   Fax:  [phone]  Physical Therapy Treatment  Patient Details  Name: [name] Page MRN: [id number] Date of Birth: 1951-08-24 Referring Provider (PT): Larena Glassman MD   Encounter Date: 07/30/2021   PT End of Session - 07/30/21 1353     Visit Number 11    Number of Visits 12    Date for PT Re-Evaluation 09/23/21    Authorization Type FOTO AT LEAST EVERY 5TH VISIT.  PROGRESS NOTE AT 10TH VISIT.  KX MODIFIER AFTER 15 VISITS.    PT Start Time 1346    PT Stop Time 1428    PT Time Calculation (min) 42 min    Activity Tolerance Patient tolerated treatment well    Behavior During Therapy WFL for tasks assessed/performed             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   sees Dr Allyson Sabal   Cancer [facility])    melanoma   Dysuria    Frequency of urination    Hematuria    History of kidney stones    Meningioma ([place]) 09/04/2020   Osteoporosis    Renal calculus, bilateral    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   Urgency of urination    Wears glasses     Past Surgical History:  Procedure Laterality Date   APPLICATION OF CRANIAL NAVIGATION Right 09/11/2020   Procedure: APPLICATION OF CRANIAL NAVIGATION;  Surgeon: Vallarie Mare, MD;  Location: [place];  Service: Neurosurgery;  Laterality: Right;   CRANIOTOMY Right 09/11/2020   Procedure: Right Pterional Craniotomy for resection of anterior skull base mass with Brain Lab;  Surgeon: Vallarie Mare, MD;  Location: [place];  Service: Neurosurgery;  Laterality: Right;   CYSTOSCOPY WITH RETROGRADE PYELOGRAM, URETEROSCOPY AND STENT PLACEMENT Bilateral 08/11/2013   Procedure: CYSTOSCOPY WITH RETROGRADE PYELOGRAM, URETEROSCOPY AND STENT PLACEMENT;  Surgeon: Alexis Frock, MD;  [facility];  Service: Urology;   Laterality: Bilateral;   CYSTOSCOPY WITH URETEROSCOPY AND STENT PLACEMENT Bilateral 08/29/2013   Procedure: CYSTOSCOPY WITH BILATERAL URETEROSCOPY AND STENT REPLACEMENTS, stone extraction;  Surgeon: Alexis Frock, MD;  Location: [facility];  Service: Urology;  Laterality: Bilateral;   CYSTOSCOPY/RETROGRADE/URETEROSCOPY/STONE EXTRACTION WITH BASKET Bilateral 01/31/2016   Procedure: CYSTOSCOPY/BILATERAL RETROGRADE PYELOGRAMS/BILATERAL URETEROSCOPY/BILATERAL STONE EXTRACTION WITH BASKET/RIGHT URETERAL STENT PLACEMENT;  Surgeon: Raynelle Bring, MD;  [facility];  Service: Urology;  Laterality: Bilateral;   HEMIARTHROPLASTY SHOULDER FRACTURE Left 1992   HOLMIUM LASER APPLICATION Bilateral 62/83/1517   Procedure: HOLMIUM LASER APPLICATION;  Surgeon: Alexis Frock, MD;  Location: [facility];  Service: Urology;  Laterality: Bilateral;    There were no vitals filed for this visit.   Subjective Assessment - 07/30/21 1350     Subjective COVID-19 screen performed prior to patient entering clinic. Patient reports that her shoulder feels good today. She notes that her physician is satisfied with her progress. She reports that she is able to do everything that she wants to with her right arm. Her physician told her that they were going to send a referral for her left shoulder.    Pertinent History Left TSA, OP, OA, Basal CA, s/p craniotomy.    Patient Stated Goals Try to avoid surgery.    Currently in Pain? No/denies  [facility] Adult PT Treatment/Exercise - 07/30/21 0001       Shoulder Exercises: Standing   External Rotation Strengthening;Right;20 reps;Theraband    Theraband Level (Shoulder External Rotation) Level 1 (Yellow)    Flexion Strengthening;Right;20 reps;Theraband    Theraband Level (Shoulder Flexion) Level 2 (Red)    Extension Strengthening;Both;20 reps   Green [employer] Strengthening;Right;20 reps;Theraband     Theraband Level (Shoulder Row) Level 2 (Red)    Other Standing Exercises Wall ladder x 20 with hold at top      Shoulder Exercises: Therapy Ball   Flexion Both;25 reps      Shoulder Exercises: ROM/Strengthening   Nustep L5 x 10 minutes    Wall Pushups 20 reps                     PT Education - 07/30/21 1800     Education Details Transitioning to therapy for left UE    Person(s) Educated Patient    Methods Explanation    Comprehension Verbalized understanding                 PT Long Term Goals - 07/30/21 1401       PT LONG TERM GOAL #1   Title Independent with a HEP.    Time 6    Period Weeks    Status Achieved      PT LONG TERM GOAL #2   Title Active right shoulder flexion to 135 degrees so the patient can easily reach overhead.    Baseline 07/27/21: 114 degrees of flexion    Time 6    Period Weeks    Status On-going      PT LONG TERM GOAL #3   Title Active ER to 70 degrees+ to allow for easily donning/doffing of apparel.    Time 6    Period Weeks    Status Achieved      PT LONG TERM GOAL #4   Title Perform ADL's with pain not > 2-3/10.    Time 6    Period Weeks    Status Achieved                   Plan - 07/30/21 1359     Clinical Impression Statement Patient was progressed with multiple new interventions for improved shoulder mobility and strength with resisted external rotation being the most difficult. She required minimal cuing for proper biomechanics with resisted external rotation to prevent compensation from surrounding musculature. She reported feeling a little tired upon the conclusion of treatment. She will be reevaluated at her next visit for her left shoulder following her physicians referral.    Personal Factors and Comorbidities Comorbidity 1;Comorbidity 2;Other    Comorbidities Left TSA, OP, OA, Basal CA, s/p craniotomy.    Examination-Activity Limitations Other    Examination-Participation Restrictions  Other;Laundry    Stability/Clinical Decision Making Stable/Uncomplicated    Rehab Potential Good    PT Frequency 2x / week    PT Duration 6 weeks    PT Treatment/Interventions ADLs/Self Care Home Management;Cryotherapy;Electrical Stimulation;Moist Heat;Therapeutic activities;Therapeutic exercise;Manual techniques;Patient/family education    PT Next Visit Plan Review HEP.  Begin with gentle right shoulder P/AROM.  Progress to seated UE Ranger.  Okay to begin supine cane exercises.    Consulted and Agree with Plan of Care Patient             Patient will benefit from skilled therapeutic intervention in order to improve the following deficits and  impairments:  Pain, Decreased activity tolerance, Decreased range of motion  Visit Diagnosis: Acute pain of right shoulder  Stiffness of right shoulder, not elsewhere classified  Muscle weakness (generalized)     Problem List Patient Active Problem List   Diagnosis Date Noted   History of resection of meningioma 11/02/2020   Peripheral edema    Hypoalbuminemia due to protein-calorie malnutrition (HCC)    Transaminitis    Tremor    Seizure prophylaxis    Iron deficiency anemia    CKD (chronic kidney disease), stage III ([place]) 09/10/2020   Vasogenic Cerebral edema ([place]) 09/04/2020   Cancer of skin, squamous cell 09/04/2020   Nephrolithiasis 09/04/2020   Essential hypertension 11/01/2015   Osteoporosis 10/30/2015    Darlin Coco, PT 07/30/2021, 6:02 PM  [facility] [address] [place], [place], [address] Phone: [phone]   Fax:  [phone]  Name: [name] Page MRN: [id number] Date of Birth: [birth year]

## 2021-08-03 ENCOUNTER — Ambulatory Visit: Payer: Medicare Other

## 2021-08-03 ENCOUNTER — Other Ambulatory Visit: Payer: Self-pay

## 2021-08-03 DIAGNOSIS — M25612 Stiffness of left shoulder, not elsewhere classified: Secondary | ICD-10-CM | POA: Diagnosis not present

## 2021-08-03 DIAGNOSIS — M25611 Stiffness of right shoulder, not elsewhere classified: Secondary | ICD-10-CM | POA: Diagnosis not present

## 2021-08-03 DIAGNOSIS — M6281 Muscle weakness (generalized): Secondary | ICD-10-CM

## 2021-08-03 DIAGNOSIS — M25511 Pain in right shoulder: Secondary | ICD-10-CM | POA: Diagnosis not present

## 2021-08-03 NOTE — Therapy (Signed)
[facility] [place], [place], [address] Phone: [phone]   Fax:  [phone]  Physical Therapy Treatment  Patient Details  Name: [name] Page MRN: [id number] Date of Birth: December 22, 1950 Referring Provider (PT): Larena Glassman MD   Encounter Date: 08/03/2021   PT End of Session - 08/03/21 1306     Visit Number 12    Number of Visits 18    Date for PT Re-Evaluation 09/23/21    Authorization Type FOTO AT LEAST EVERY 5TH VISIT.  PROGRESS NOTE AT 10TH VISIT.  KX MODIFIER AFTER 15 VISITS.    PT Start Time 1300    PT Stop Time 1347    PT Time Calculation (min) 47 min    Activity Tolerance Patient tolerated treatment well    Behavior During Therapy WFL for tasks assessed/performed             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   sees Dr Allyson Sabal   Cancer [facility])    melanoma   Dysuria    Frequency of urination    Hematuria    History of kidney stones    Meningioma ([place]) 09/04/2020   Osteoporosis    Renal calculus, bilateral    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   Urgency of urination    Wears glasses     Past Surgical History:  Procedure Laterality Date   APPLICATION OF CRANIAL NAVIGATION Right 09/11/2020   Procedure: APPLICATION OF CRANIAL NAVIGATION;  Surgeon: Vallarie Mare, MD;  Location: [place];  Service: Neurosurgery;  Laterality: Right;   CRANIOTOMY Right 09/11/2020   Procedure: Right Pterional Craniotomy for resection of anterior skull base mass with Brain Lab;  Surgeon: Vallarie Mare, MD;  Location: [place];  Service: Neurosurgery;  Laterality: Right;   CYSTOSCOPY WITH RETROGRADE PYELOGRAM, URETEROSCOPY AND STENT PLACEMENT Bilateral 08/11/2013   Procedure: CYSTOSCOPY WITH RETROGRADE PYELOGRAM, URETEROSCOPY AND STENT PLACEMENT;  Surgeon: Alexis Frock, MD;  [facility];  Service: Urology;   Laterality: Bilateral;   CYSTOSCOPY WITH URETEROSCOPY AND STENT PLACEMENT Bilateral 08/29/2013   Procedure: CYSTOSCOPY WITH BILATERAL URETEROSCOPY AND STENT REPLACEMENTS, stone extraction;  Surgeon: Alexis Frock, MD;  Location: [facility];  Service: Urology;  Laterality: Bilateral;   CYSTOSCOPY/RETROGRADE/URETEROSCOPY/STONE EXTRACTION WITH BASKET Bilateral 01/31/2016   Procedure: CYSTOSCOPY/BILATERAL RETROGRADE PYELOGRAMS/BILATERAL URETEROSCOPY/BILATERAL STONE EXTRACTION WITH BASKET/RIGHT URETERAL STENT PLACEMENT;  Surgeon: Raynelle Bring, MD;  [facility];  Service: Urology;  Laterality: Bilateral;   HEMIARTHROPLASTY SHOULDER FRACTURE Left 1992   HOLMIUM LASER APPLICATION Bilateral 62/37/6283   Procedure: HOLMIUM LASER APPLICATION;  Surgeon: Alexis Frock, MD;  Location: [facility];  Service: Urology;  Laterality: Bilateral;    There were no vitals filed for this visit.   Subjective Assessment - 08/03/21 1301     Subjective COVID-19 screen performed prior to patient entering clinic. Patient reports that her left shoulder feels stiff, but she is not having any pain or discomfort. She notes that her left shoulder began getting weak last December while she was in the hospital. She notes that it has been slowly getting worse since then. She notes that she had a partial shoulder replacement about 28 years ago. She notes that her arm was moving well prior to this decline within the last year.    Pertinent History Left TSA, OP, OA, Basal CA, s/p  craniotomy.    Patient Stated Goals raise left arm    Currently in Pain? No/denies                [facility] PT Assessment - 08/03/21 0001       Balance Screen   Has the patient fallen in the past 6 months Yes    How many times? 1    Has the patient had a decrease in activity level because of a fear of falling?  Yes    Is the patient reluctant to leave their home because of a fear of falling?  Yes      [place] residence      Prior Function   Level of Independence Independent    Leisure Dancing, walking dog, watch grandchildren play basketball, and go shopping      AROM   Right/Left Shoulder Left;Right    Right Shoulder Flexion 109 Degrees    Right Shoulder ABduction 100 Degrees    Right Shoulder Internal Rotation --   T10   Right Shoulder External Rotation --   spine of scapula   Left Shoulder Flexion 56 Degrees   with significant scapular elevation   Left Shoulder ABduction 51 Degrees   significant scapular elevation   Left Shoulder Internal Rotation --   T10   Left Shoulder External Rotation --   spine of scapula     PROM   PROM Assessment Site Shoulder    Right/Left Shoulder Left    Left Shoulder Flexion 90 Degrees    Left Shoulder ABduction 84 Degrees      Palpation   Palpation comment Mild tenderness to palpation over left deltoid, biceps, and infraspinatus                           [facility] Adult PT Treatment/Exercise - 08/03/21 0001       Shoulder Exercises: Supine   Flexion AAROM;Both;20 reps   2 sets     Shoulder Exercises: Therapy Ball   Flexion Both;25 reps      Shoulder Exercises: ROM/Strengthening   Nustep L5 x 10 minutes      Manual Therapy   Manual Therapy Passive ROM;Soft tissue mobilization;Joint mobilization    Joint Mobilization Left GH grade I-III inferior and posterior    Soft tissue mobilization left                          PT Long Term Goals - 08/03/21 1409       PT LONG TERM GOAL #1   Title Independent with a HEP.    Time 3    Period Weeks    Status New    Target Date 09/04/21      PT LONG TERM GOAL #2   Title Active left shoulder flexion to 80 degrees so the patient can reach forward easily.    Time 3    Period Weeks    Status New    Target Date 09/04/21      PT LONG TERM GOAL #3   Title Patient will be able to demonstrate at least 70 degrees of active left  shoulder abduction for improved function reaching.    Time 3    Period Weeks    Status New    Target Date 09/04/21  Plan - 08/03/21 1316     Clinical Impression Statement Today's reevaluation was completed to include her her left shoulder into her plan of care due to the new referral from her physician. She exhibited significant stiffness and weakness throughout the left shoulder as evidenced by the significant differance between her active and passive range of motion. She exhibited minimal shoulder pain or discomfort with any of today's testing or interventions. Recommend that she continue with her updated plan of care to address her impairments to maximize her functional mobility.    Personal Factors and Comorbidities Comorbidity 1;Comorbidity 2;Other    Comorbidities Left TSA, OP, OA, Basal CA, s/p craniotomy.    Examination-Activity Limitations Other    Examination-Participation Restrictions Other;Laundry    Stability/Clinical Decision Making Stable/Uncomplicated    Rehab Potential Good    PT Frequency 2x / week    PT Duration 6 weeks    PT Treatment/Interventions ADLs/Self Care Home Management;Cryotherapy;Electrical Stimulation;Moist Heat;Therapeutic activities;Therapeutic exercise;Manual techniques;Patient/family education    PT Next Visit Plan Review HEP.  Begin with gentle right shoulder P/AROM.  Progress to seated UE Ranger.  Okay to begin supine cane exercises.    Consulted and Agree with Plan of Care Patient             Patient will benefit from skilled therapeutic intervention in order to improve the following deficits and impairments:  Pain, Decreased activity tolerance, Decreased range of motion  Visit Diagnosis: Stiffness of left shoulder, not elsewhere classified  Muscle weakness (generalized)     Problem List Patient Active Problem List   Diagnosis Date Noted   History of resection of meningioma 11/02/2020   Peripheral edema     Hypoalbuminemia due to protein-calorie malnutrition (HCC)    Transaminitis    Tremor    Seizure prophylaxis    Iron deficiency anemia    CKD (chronic kidney disease), stage III ([place]) 09/10/2020   Vasogenic Cerebral edema ([place]) 09/04/2020   Cancer of skin, squamous cell 09/04/2020   Nephrolithiasis 09/04/2020   Essential hypertension 11/01/2015   Osteoporosis 10/30/2015    Darlin Coco, PT 08/03/2021, 3:24 PM  [facility] [address] [place], [place], [address] Phone: [phone]   Fax:  [phone]  Name: NATIKA GEYER MRN: [id number] Date of Birth: 02-15-1951

## 2021-08-04 DIAGNOSIS — R82998 Other abnormal findings in urine: Secondary | ICD-10-CM | POA: Diagnosis not present

## 2021-08-04 DIAGNOSIS — N281 Cyst of kidney, acquired: Secondary | ICD-10-CM | POA: Diagnosis not present

## 2021-08-04 DIAGNOSIS — N200 Calculus of kidney: Secondary | ICD-10-CM | POA: Diagnosis not present

## 2021-08-05 ENCOUNTER — Ambulatory Visit: Payer: Medicare Other | Admitting: Family Medicine

## 2021-08-06 ENCOUNTER — Ambulatory Visit: Payer: Medicare Other

## 2021-08-06 ENCOUNTER — Other Ambulatory Visit: Payer: Self-pay

## 2021-08-06 DIAGNOSIS — M25611 Stiffness of right shoulder, not elsewhere classified: Secondary | ICD-10-CM | POA: Diagnosis not present

## 2021-08-06 DIAGNOSIS — M25612 Stiffness of left shoulder, not elsewhere classified: Secondary | ICD-10-CM

## 2021-08-06 DIAGNOSIS — M6281 Muscle weakness (generalized): Secondary | ICD-10-CM

## 2021-08-06 DIAGNOSIS — M25511 Pain in right shoulder: Secondary | ICD-10-CM | POA: Diagnosis not present

## 2021-08-06 NOTE — Therapy (Signed)
[facility] [place], [place], [address] Phone: [phone]   Fax:  [phone]  Physical Therapy Treatment  Patient Details  Name: [name] Page MRN: [id number] Date of Birth: Apr 01, 1951 Referring Provider (PT): Larena Glassman MD   Encounter Date: 08/06/2021   PT End of Session - 08/06/21 1305     Visit Number 13    Number of Visits 18    Date for PT Re-Evaluation 09/23/21    Authorization Type FOTO AT LEAST EVERY 5TH VISIT.  PROGRESS NOTE AT 10TH VISIT.  KX MODIFIER AFTER 15 VISITS.    PT Start Time 1301    PT Stop Time 1400    PT Time Calculation (min) 59 min    Activity Tolerance Patient tolerated treatment well    Behavior During Therapy WFL for tasks assessed/performed             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   sees Dr Allyson Sabal   Cancer [facility])    melanoma   Dysuria    Frequency of urination    Hematuria    History of kidney stones    Meningioma ([place]) 09/04/2020   Osteoporosis    Renal calculus, bilateral    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   Urgency of urination    Wears glasses     Past Surgical History:  Procedure Laterality Date   APPLICATION OF CRANIAL NAVIGATION Right 09/11/2020   Procedure: APPLICATION OF CRANIAL NAVIGATION;  Surgeon: Vallarie Mare, MD;  Location: [place];  Service: Neurosurgery;  Laterality: Right;   CRANIOTOMY Right 09/11/2020   Procedure: Right Pterional Craniotomy for resection of anterior skull base mass with Brain Lab;  Surgeon: Vallarie Mare, MD;  Location: [place];  Service: Neurosurgery;  Laterality: Right;   CYSTOSCOPY WITH RETROGRADE PYELOGRAM, URETEROSCOPY AND STENT PLACEMENT Bilateral 08/11/2013   Procedure: CYSTOSCOPY WITH RETROGRADE PYELOGRAM, URETEROSCOPY AND STENT PLACEMENT;  Surgeon: Alexis Frock, MD;  [facility];  Service: Urology;   Laterality: Bilateral;   CYSTOSCOPY WITH URETEROSCOPY AND STENT PLACEMENT Bilateral 08/29/2013   Procedure: CYSTOSCOPY WITH BILATERAL URETEROSCOPY AND STENT REPLACEMENTS, stone extraction;  Surgeon: Alexis Frock, MD;  Location: [facility];  Service: Urology;  Laterality: Bilateral;   CYSTOSCOPY/RETROGRADE/URETEROSCOPY/STONE EXTRACTION WITH BASKET Bilateral 01/31/2016   Procedure: CYSTOSCOPY/BILATERAL RETROGRADE PYELOGRAMS/BILATERAL URETEROSCOPY/BILATERAL STONE EXTRACTION WITH BASKET/RIGHT URETERAL STENT PLACEMENT;  Surgeon: Raynelle Bring, MD;  [facility];  Service: Urology;  Laterality: Bilateral;   HEMIARTHROPLASTY SHOULDER FRACTURE Left 1992   HOLMIUM LASER APPLICATION Bilateral 97/98/9211   Procedure: HOLMIUM LASER APPLICATION;  Surgeon: Alexis Frock, MD;  Location: [facility];  Service: Urology;  Laterality: Bilateral;    There were no vitals filed for this visit.   Subjective Assessment - 08/06/21 1304     Subjective COVID-19 screen performed prior to patient entering clinic. Pt arrives for today's treatment session reporting 4/10 left shoulder pain.    Pertinent History Left TSA, OP, OA, Basal CA, s/p craniotomy.    Patient Stated Goals raise left arm    Currently in Pain? Yes    Pain Score 4     Pain Location Shoulder    Pain Orientation Left  [facility] Adult PT Treatment/Exercise - 08/06/21 0001       Shoulder Exercises: Standing   Other Standing Exercises Wall ladder x 10   cues to avoid compensatory movements     Shoulder Exercises: Pulleys   Flexion 3 minutes      Shoulder Exercises: ROM/Strengthening   Nustep Lvl 5 x 15 mins    Ranger Seated; flex/ext; CW/CCW circles      Modalities   Modalities Electrical Stimulation;Moist Heat      Moist Heat Therapy   Number Minutes Moist Heat 15 Minutes    Moist Heat Location Shoulder      Electrical Stimulation   Electrical Stimulation  Location L shoulder    Electrical Stimulation Action Pre-mod    Electrical Stimulation Parameters 80-150 Hz x 15 mins    Electrical Stimulation Goals Pain      Manual Therapy   Manual Therapy Passive ROM    Passive ROM Supine P/AAROM of left shoulder into flex, ER, and IR with gentle holds at end range                          PT Long Term Goals - 08/03/21 1409       PT LONG TERM GOAL #1   Title Independent with a HEP.    Time 3    Period Weeks    Status New    Target Date 09/04/21      PT LONG TERM GOAL #2   Title Active left shoulder flexion to 80 degrees so the patient can reach forward easily.    Time 3    Period Weeks    Status New    Target Date 09/04/21      PT LONG TERM GOAL #3   Title Patient will be able to demonstrate at least 70 degrees of active left shoulder abduction for improved function reaching.    Time 3    Period Weeks    Status New    Target Date 09/04/21                   Plan - 08/06/21 1305     Clinical Impression Statement Pt arrives for today's treatment session reporting 5/10 left shoulder pain.  Pt is very pleased with the progress she has had on her right shoulder and looks forward to progress with her left shoulder.  Pt currently considering replacing her left shoulder replacement after the first of the year.  Pt reported 3/10 left shoulder pain at completion of today's treamtent session.    Personal Factors and Comorbidities Comorbidity 1;Comorbidity 2;Other    Comorbidities Left TSA, OP, OA, Basal CA, s/p craniotomy.    Examination-Activity Limitations Other    Examination-Participation Restrictions Other;Laundry    Stability/Clinical Decision Making Stable/Uncomplicated    Rehab Potential Good    PT Frequency 2x / week    PT Duration 6 weeks    PT Treatment/Interventions ADLs/Self Care Home Management;Cryotherapy;Electrical Stimulation;Moist Heat;Therapeutic activities;Therapeutic exercise;Manual  techniques;Patient/family education    PT Next Visit Plan Review HEP.  Begin with gentle right shoulder P/AROM.  Progress to seated UE Ranger.  Okay to begin supine cane exercises.    Consulted and Agree with Plan of Care Patient             Patient will benefit from skilled therapeutic intervention in order to improve the following deficits and impairments:  Pain, Decreased activity tolerance, Decreased range of motion  Visit Diagnosis: Stiffness  of left shoulder, not elsewhere classified  Muscle weakness (generalized)  Acute pain of right shoulder  Stiffness of right shoulder, not elsewhere classified     Problem List Patient Active Problem List   Diagnosis Date Noted   History of resection of meningioma 11/02/2020   Peripheral edema    Hypoalbuminemia due to protein-calorie malnutrition (HCC)    Transaminitis    Tremor    Seizure prophylaxis    Iron deficiency anemia    CKD (chronic kidney disease), stage III ([place]) 09/10/2020   Vasogenic Cerebral edema ([place]) 09/04/2020   Cancer of skin, squamous cell 09/04/2020   Nephrolithiasis 09/04/2020   Essential hypertension 11/01/2015   Osteoporosis 10/30/2015    Kathrynn Ducking, PTA 08/06/2021, 2:21 PM  [facility] [address] [place], [place], [address] Phone: [phone]   Fax:  [phone]  Name: [name] Page MRN: [id number] Date of Birth: [birth year]

## 2021-08-11 ENCOUNTER — Other Ambulatory Visit: Payer: Self-pay

## 2021-08-11 ENCOUNTER — Ambulatory Visit: Payer: Medicare Other | Admitting: Physical Therapy

## 2021-08-11 DIAGNOSIS — M25611 Stiffness of right shoulder, not elsewhere classified: Secondary | ICD-10-CM | POA: Diagnosis not present

## 2021-08-11 DIAGNOSIS — M25612 Stiffness of left shoulder, not elsewhere classified: Secondary | ICD-10-CM | POA: Diagnosis not present

## 2021-08-11 DIAGNOSIS — M25511 Pain in right shoulder: Secondary | ICD-10-CM | POA: Diagnosis not present

## 2021-08-11 DIAGNOSIS — M6281 Muscle weakness (generalized): Secondary | ICD-10-CM

## 2021-08-11 NOTE — Therapy (Addendum)
[facility] [place], [place], [address] Phone: [phone]   Fax:  [phone]  Physical Therapy Treatment  Patient Details  Name: [name] Page MRN: [id number] Date of Birth: [birth year] Referring Provider (PT): Larena Glassman MD   Encounter Date: 08/11/2021   PT End of Session - 08/11/21 1310     Visit Number 14    Number of Visits 18    Date for PT Re-Evaluation 09/23/21    Authorization Type FOTO AT LEAST EVERY 5TH VISIT.  PROGRESS NOTE AT 10TH VISIT.  KX MODIFIER AFTER 15 VISITS.    PT Start Time 0100    PT Stop Time 0151    PT Time Calculation (min) 51 min    Activity Tolerance Patient tolerated treatment well    Behavior During Therapy WFL for tasks assessed/performed             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   sees Dr Allyson Sabal   Cancer [facility])    melanoma   Dysuria    Frequency of urination    Hematuria    History of kidney stones    Meningioma ([place]) 09/04/2020   Osteoporosis    Renal calculus, bilateral    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   Urgency of urination    Wears glasses     Past Surgical History:  Procedure Laterality Date   APPLICATION OF CRANIAL NAVIGATION Right 09/11/2020   Procedure: APPLICATION OF CRANIAL NAVIGATION;  Surgeon: Vallarie Mare, MD;  Location: [place];  Service: Neurosurgery;  Laterality: Right;   CRANIOTOMY Right 09/11/2020   Procedure: Right Pterional Craniotomy for resection of anterior skull base mass with Brain Lab;  Surgeon: Vallarie Mare, MD;  Location: [place];  Service: Neurosurgery;  Laterality: Right;   CYSTOSCOPY WITH RETROGRADE PYELOGRAM, URETEROSCOPY AND STENT PLACEMENT Bilateral 08/11/2013   Procedure: CYSTOSCOPY WITH RETROGRADE PYELOGRAM, URETEROSCOPY AND STENT PLACEMENT;  Surgeon: Alexis Frock, MD;  Location: [facility];  Service: Urology;   Laterality: Bilateral;   CYSTOSCOPY WITH URETEROSCOPY AND STENT PLACEMENT Bilateral 08/29/2013   Procedure: CYSTOSCOPY WITH BILATERAL URETEROSCOPY AND STENT REPLACEMENTS, stone extraction;  Surgeon: Alexis Frock, MD;  Location: [facility];  Service: Urology;  Laterality: Bilateral;   CYSTOSCOPY/RETROGRADE/URETEROSCOPY/STONE EXTRACTION WITH BASKET Bilateral 01/31/2016   Procedure: CYSTOSCOPY/BILATERAL RETROGRADE PYELOGRAMS/BILATERAL URETEROSCOPY/BILATERAL STONE EXTRACTION WITH BASKET/RIGHT URETERAL STENT PLACEMENT;  Surgeon: Raynelle Bring, MD;  Location: [facility];  Service: Urology;  Laterality: Bilateral;   HEMIARTHROPLASTY SHOULDER FRACTURE Left 1992   HOLMIUM LASER APPLICATION Bilateral 58/05/9832   Procedure: HOLMIUM LASER APPLICATION;  Surgeon: Alexis Frock, MD;  Location[facility];  Service: Urology;  Laterality: Bilateral;    There were no vitals filed for this visit.   Subjective Assessment - 08/11/21 1304     Subjective COVID-19 screen performed prior to patient entering clinic. Pt reported just soreness in shld today    Pertinent History Left TSA, OP, OA, Basal CA, s/p craniotomy.    Patient Stated Goals raise left arm    Currently in Pain? Yes    Pain Score 4     Pain Location Shoulder    Pain Orientation Left    Pain Descriptors / Indicators Sore    Pain Type Acute pain    Pain Onset More than a month ago    Pain Frequency Intermittent  Aggravating Factors  use of shoulder    Pain Relieving Factors rest                [facility] PT Assessment - 08/11/21 0001       ROM / Strength   AROM / PROM / Strength AROM;PROM      AROM   AROM Assessment Site Shoulder    Right/Left Shoulder Left    Left Shoulder Flexion 60 Degrees    Left Shoulder ABduction 40 Degrees      PROM   PROM Assessment Site Shoulder    Right/Left Shoulder Left    Left Shoulder Flexion 95 Degrees    Left Shoulder ABduction 90 Degrees                            [facility] Adult PT Treatment/Exercise - 08/11/21 0001       Shoulder Exercises: Seated   Retraction Strengthening;Both;10 reps   3 sec hold     Shoulder Exercises: Pulleys   Flexion Other (comment)   flexion and scap/abd x 20mn each     Shoulder Exercises: ROM/Strengthening   Nustep Lvl 5 x 15 mins      Shoulder Exercises: Isometric Strengthening   Flexion 5X5"    Extension 5X5"    External Rotation 5X5"    Internal Rotation 5X5"    ABduction 5X5"      Moist Heat Therapy   Number Minutes Moist Heat 15 Minutes    Moist Heat Location Shoulder      Electrical Stimulation   Electrical Stimulation Location L shoulder    Electrical Stimulation Action premod    Electrical Stimulation Parameters 80-'150hz'  x171m    Electrical Stimulation Goals Other (comment)   soreness                    PT Education - 08/11/21 1325     Education Details HEP for isometrics and verbal instruction for scap retraction 10 reps 3 sec holds    Person(s) Educated Patient    Methods Explanation;Demonstration;Handout    Comprehension Verbalized understanding;Returned demonstration                 PT Long Term Goals - 08/11/21 1309       PT LONG TERM GOAL #1   Title Independent with a HEP.    Time 3    Period Weeks    Status On-going      PT LONG TERM GOAL #2   Title Active left shoulder flexion to 80 degrees so the patient can reach forward easily.    Baseline AROM 60 degrees 08/11/21    Time 3    Period Weeks    Status On-going      PT LONG TERM GOAL #3   Title Patient will be able to demonstrate at least 70 degrees of active left shoulder abduction for improved function reaching.    Baseline AROM 40 degrees 08/11/21    Time 3    Period Weeks    Status On-going      PT LONG TERM GOAL #4   Title Perform ADL's with pain not > 2-3/10.    Time 6    Period Weeks    Status Achieved                   Plan - 08/11/21 1326      Clinical Impression Statement Patient arrived with ongoing soreness in shoulder.  Pt continues to have difficulty with overhead reachning. Today PROM is WNL and able to start isometrics for strengtheing. HEP provided. Goals progressing today.    Personal Factors and Comorbidities Comorbidity 1;Comorbidity 2;Other    Comorbidities Left TSA, OP, OA, Basal CA, s/p craniotomy.    Examination-Activity Limitations Other    Examination-Participation Restrictions Other;Laundry    Stability/Clinical Decision Making Stable/Uncomplicated    Rehab Potential Good    PT Frequency 2x / week    PT Duration 6 weeks    PT Treatment/Interventions ADLs/Self Care Home Management;Cryotherapy;Electrical Stimulation;Moist Heat;Therapeutic activities;Therapeutic exercise;Manual techniques;Patient/family education    PT Next Visit Plan cont  with gentle right shoulder P/AROM.  Progress to seated UE Ranger.  Okay to begin supine cane exercises.    Consulted and Agree with Plan of Care Patient             Patient will benefit from skilled therapeutic intervention in order to improve the following deficits and impairments:  Pain, Decreased activity tolerance, Decreased range of motion  Visit Diagnosis: Stiffness of left shoulder, not elsewhere classified  Muscle weakness (generalized)     Problem List Patient Active Problem List   Diagnosis Date Noted   History of resection of meningioma 11/02/2020   Peripheral edema    Hypoalbuminemia due to protein-calorie malnutrition (HCC)    Transaminitis    Tremor    Seizure prophylaxis    Iron deficiency anemia    CKD (chronic kidney disease), stage III ([place]) 09/10/2020   Vasogenic Cerebral edema ([place]) 09/04/2020   Cancer of skin, squamous cell 09/04/2020   Nephrolithiasis 09/04/2020   Essential hypertension 11/01/2015   Osteoporosis 10/30/2015    Alyus Mofield P, PTA 08/11/2021, 1:52 PM  [facility] [address] [place], [place], [address] Phone: [phone]   Fax:  [phone]  Name: [name] Page MRN: [id number] Date of Birth: Nov 03, 1950   PHYSICAL THERAPY DISCHARGE SUMMARY  Visits from Start of Care: 14.  Current functional level related to goals / functional outcomes: See above.   Remaining deficits: See goal section.   Education / Equipment: HEP.   Patient agrees to discharge. Patient goals were partially met. Patient is being discharged due to being pleased with the current functional level.     Mali Applegate MPT

## 2021-08-11 NOTE — Patient Instructions (Signed)
Strengthening: Isometric Flexion   Using wall for resistance, press right fist into ball using light pressure. Hold __5__ seconds. Repeat __5__ times per set. Do __1__ sets per session. Do _1___ sessions per day.   Strengthening: Isometric Extension   Using wall for resistance, press back of left arm into ball using light pressure. Hold _5___ seconds. Repeat __5__ times per set. Do __1__ sets per session. Do __1__ sessions per day.   Strengthening: Isometric External Rotation   Using wall to provide resistance, and keeping right arm at side, press back of hand into ball using light pressure. Hold __5__ seconds. Repeat __5__ times per set. Do _1___ sets per session. Do __1__ sessions per day.   Strengthening: Isometric Internal Rotation   Using door frame for resistance, press palm of right hand into ball using light pressure. Keep elbow in at side. Hold _5___ seconds. Repeat __5_ times per set. Do _1__ sets per session. Do __1_ sessions per day.

## 2021-08-17 ENCOUNTER — Ambulatory Visit: Payer: Medicare Other

## 2021-08-27 DIAGNOSIS — D045 Carcinoma in situ of skin of trunk: Secondary | ICD-10-CM | POA: Diagnosis not present

## 2021-09-10 DIAGNOSIS — R229 Localized swelling, mass and lump, unspecified: Secondary | ICD-10-CM | POA: Diagnosis not present

## 2021-09-10 DIAGNOSIS — C44629 Squamous cell carcinoma of skin of left upper limb, including shoulder: Secondary | ICD-10-CM | POA: Diagnosis not present

## 2021-09-10 DIAGNOSIS — D225 Melanocytic nevi of trunk: Secondary | ICD-10-CM | POA: Diagnosis not present

## 2021-09-10 DIAGNOSIS — Z85828 Personal history of other malignant neoplasm of skin: Secondary | ICD-10-CM | POA: Diagnosis not present

## 2021-09-10 DIAGNOSIS — Z08 Encounter for follow-up examination after completed treatment for malignant neoplasm: Secondary | ICD-10-CM | POA: Diagnosis not present

## 2021-09-10 DIAGNOSIS — C44622 Squamous cell carcinoma of skin of right upper limb, including shoulder: Secondary | ICD-10-CM | POA: Diagnosis not present

## 2021-09-10 DIAGNOSIS — L814 Other melanin hyperpigmentation: Secondary | ICD-10-CM | POA: Diagnosis not present

## 2021-09-10 DIAGNOSIS — D485 Neoplasm of uncertain behavior of skin: Secondary | ICD-10-CM | POA: Diagnosis not present

## 2021-09-23 DIAGNOSIS — D485 Neoplasm of uncertain behavior of skin: Secondary | ICD-10-CM | POA: Diagnosis not present

## 2021-09-25 ENCOUNTER — Other Ambulatory Visit: Payer: Medicare Other

## 2021-09-25 DIAGNOSIS — E782 Mixed hyperlipidemia: Secondary | ICD-10-CM | POA: Diagnosis not present

## 2021-09-25 DIAGNOSIS — I10 Essential (primary) hypertension: Secondary | ICD-10-CM | POA: Diagnosis not present

## 2021-09-25 DIAGNOSIS — M810 Age-related osteoporosis without current pathological fracture: Secondary | ICD-10-CM | POA: Diagnosis not present

## 2021-09-26 LAB — CMP14+EGFR
ALT: 11 IU/L (ref 0–32)
AST: 19 IU/L (ref 0–40)
Albumin/Globulin Ratio: 2 (ref 1.2–2.2)
Albumin: 4 g/dL (ref 3.7–4.7)
Alkaline Phosphatase: 121 IU/L (ref 44–121)
BUN/Creatinine Ratio: 22 (ref 12–28)
BUN: 22 mg/dL (ref 8–27)
Bilirubin Total: 0.4 mg/dL (ref 0.0–1.2)
CO2: 29 mmol/L (ref 20–29)
Calcium: 9.4 mg/dL (ref 8.7–10.3)
Chloride: 107 mmol/L — ABNORMAL HIGH (ref 96–106)
Creatinine, Ser: 0.98 mg/dL (ref 0.57–1.00)
Globulin, Total: 2 g/dL (ref 1.5–4.5)
Glucose: 84 mg/dL (ref 70–99)
Potassium: 4.7 mmol/L (ref 3.5–5.2)
Sodium: 145 mmol/L — ABNORMAL HIGH (ref 134–144)
Total Protein: 6 g/dL (ref 6.0–8.5)
eGFR: 62 mL/min/{1.73_m2} (ref >59)

## 2021-09-26 LAB — LIPID PANEL
Chol/HDL Ratio: 2.4 ratio (ref 0.0–4.4)
Cholesterol, Total: 153 mg/dL (ref 100–199)
HDL: 65 mg/dL (ref >39)
LDL Chol Calc (NIH): 71 mg/dL (ref 0–99)
Triglycerides: 93 mg/dL (ref 0–149)
VLDL Cholesterol Cal: 17 mg/dL (ref 5–40)

## 2021-09-26 LAB — VITAMIN D 25 HYDROXY (VIT D DEFICIENCY, FRACTURES): Vit D, 25-Hydroxy: 42.3 ng/mL (ref 30.0–100.0)

## 2021-09-28 NOTE — Progress Notes (Signed)
PT R/C about labs

## 2021-09-29 ENCOUNTER — Other Ambulatory Visit: Payer: Self-pay | Admitting: Family Medicine

## 2021-09-29 DIAGNOSIS — E782 Mixed hyperlipidemia: Secondary | ICD-10-CM

## 2021-09-29 MED ORDER — ROSUVASTATIN CALCIUM 10 MG PO TABS: 10.0000 mg | ORAL_TABLET | Freq: Every day | ORAL | 3 refills | Status: DC

## 2021-10-14 DIAGNOSIS — D0359 Melanoma in situ of other part of trunk: Secondary | ICD-10-CM | POA: Diagnosis not present

## 2021-10-14 DIAGNOSIS — L905 Scar conditions and fibrosis of skin: Secondary | ICD-10-CM | POA: Diagnosis not present

## 2021-10-28 DIAGNOSIS — C44622 Squamous cell carcinoma of skin of right upper limb, including shoulder: Secondary | ICD-10-CM | POA: Diagnosis not present

## 2021-11-07 DIAGNOSIS — Z20822 Contact with and (suspected) exposure to covid-19: Secondary | ICD-10-CM | POA: Diagnosis not present

## 2021-11-23 DIAGNOSIS — M9903 Segmental and somatic dysfunction of lumbar region: Secondary | ICD-10-CM | POA: Diagnosis not present

## 2021-11-23 DIAGNOSIS — M9901 Segmental and somatic dysfunction of cervical region: Secondary | ICD-10-CM | POA: Diagnosis not present

## 2021-11-23 DIAGNOSIS — M9902 Segmental and somatic dysfunction of thoracic region: Secondary | ICD-10-CM | POA: Diagnosis not present

## 2021-11-23 DIAGNOSIS — M5412 Radiculopathy, cervical region: Secondary | ICD-10-CM | POA: Diagnosis not present

## 2021-11-25 DIAGNOSIS — M9901 Segmental and somatic dysfunction of cervical region: Secondary | ICD-10-CM | POA: Diagnosis not present

## 2021-11-25 DIAGNOSIS — M9902 Segmental and somatic dysfunction of thoracic region: Secondary | ICD-10-CM | POA: Diagnosis not present

## 2021-11-25 DIAGNOSIS — M9903 Segmental and somatic dysfunction of lumbar region: Secondary | ICD-10-CM | POA: Diagnosis not present

## 2021-11-25 DIAGNOSIS — M5412 Radiculopathy, cervical region: Secondary | ICD-10-CM | POA: Diagnosis not present

## 2021-11-26 DIAGNOSIS — M5412 Radiculopathy, cervical region: Secondary | ICD-10-CM | POA: Diagnosis not present

## 2021-11-26 DIAGNOSIS — M9903 Segmental and somatic dysfunction of lumbar region: Secondary | ICD-10-CM | POA: Diagnosis not present

## 2021-11-26 DIAGNOSIS — M9901 Segmental and somatic dysfunction of cervical region: Secondary | ICD-10-CM | POA: Diagnosis not present

## 2021-11-26 DIAGNOSIS — M9902 Segmental and somatic dysfunction of thoracic region: Secondary | ICD-10-CM | POA: Diagnosis not present

## 2021-11-27 ENCOUNTER — Other Ambulatory Visit: Payer: Self-pay | Admitting: Family Medicine

## 2021-12-01 DIAGNOSIS — M9901 Segmental and somatic dysfunction of cervical region: Secondary | ICD-10-CM | POA: Diagnosis not present

## 2021-12-01 DIAGNOSIS — M9902 Segmental and somatic dysfunction of thoracic region: Secondary | ICD-10-CM | POA: Diagnosis not present

## 2021-12-01 DIAGNOSIS — M9903 Segmental and somatic dysfunction of lumbar region: Secondary | ICD-10-CM | POA: Diagnosis not present

## 2021-12-01 DIAGNOSIS — M5412 Radiculopathy, cervical region: Secondary | ICD-10-CM | POA: Diagnosis not present

## 2021-12-02 DIAGNOSIS — M9903 Segmental and somatic dysfunction of lumbar region: Secondary | ICD-10-CM | POA: Diagnosis not present

## 2021-12-02 DIAGNOSIS — M5412 Radiculopathy, cervical region: Secondary | ICD-10-CM | POA: Diagnosis not present

## 2021-12-02 DIAGNOSIS — M9901 Segmental and somatic dysfunction of cervical region: Secondary | ICD-10-CM | POA: Diagnosis not present

## 2021-12-02 DIAGNOSIS — M9902 Segmental and somatic dysfunction of thoracic region: Secondary | ICD-10-CM | POA: Diagnosis not present

## 2021-12-03 DIAGNOSIS — M9902 Segmental and somatic dysfunction of thoracic region: Secondary | ICD-10-CM | POA: Diagnosis not present

## 2021-12-03 DIAGNOSIS — M9901 Segmental and somatic dysfunction of cervical region: Secondary | ICD-10-CM | POA: Diagnosis not present

## 2021-12-03 DIAGNOSIS — M9903 Segmental and somatic dysfunction of lumbar region: Secondary | ICD-10-CM | POA: Diagnosis not present

## 2021-12-03 DIAGNOSIS — M5412 Radiculopathy, cervical region: Secondary | ICD-10-CM | POA: Diagnosis not present

## 2021-12-07 DIAGNOSIS — M5412 Radiculopathy, cervical region: Secondary | ICD-10-CM | POA: Diagnosis not present

## 2021-12-07 DIAGNOSIS — M9901 Segmental and somatic dysfunction of cervical region: Secondary | ICD-10-CM | POA: Diagnosis not present

## 2021-12-07 DIAGNOSIS — M9902 Segmental and somatic dysfunction of thoracic region: Secondary | ICD-10-CM | POA: Diagnosis not present

## 2021-12-07 DIAGNOSIS — M9903 Segmental and somatic dysfunction of lumbar region: Secondary | ICD-10-CM | POA: Diagnosis not present

## 2021-12-10 DIAGNOSIS — M5412 Radiculopathy, cervical region: Secondary | ICD-10-CM | POA: Diagnosis not present

## 2021-12-10 DIAGNOSIS — M9903 Segmental and somatic dysfunction of lumbar region: Secondary | ICD-10-CM | POA: Diagnosis not present

## 2021-12-10 DIAGNOSIS — M9901 Segmental and somatic dysfunction of cervical region: Secondary | ICD-10-CM | POA: Diagnosis not present

## 2021-12-10 DIAGNOSIS — M9902 Segmental and somatic dysfunction of thoracic region: Secondary | ICD-10-CM | POA: Diagnosis not present

## 2021-12-14 DIAGNOSIS — M9902 Segmental and somatic dysfunction of thoracic region: Secondary | ICD-10-CM | POA: Diagnosis not present

## 2021-12-14 DIAGNOSIS — M9903 Segmental and somatic dysfunction of lumbar region: Secondary | ICD-10-CM | POA: Diagnosis not present

## 2021-12-14 DIAGNOSIS — M9901 Segmental and somatic dysfunction of cervical region: Secondary | ICD-10-CM | POA: Diagnosis not present

## 2021-12-14 DIAGNOSIS — M5412 Radiculopathy, cervical region: Secondary | ICD-10-CM | POA: Diagnosis not present

## 2021-12-17 DIAGNOSIS — M9903 Segmental and somatic dysfunction of lumbar region: Secondary | ICD-10-CM | POA: Diagnosis not present

## 2021-12-17 DIAGNOSIS — M9901 Segmental and somatic dysfunction of cervical region: Secondary | ICD-10-CM | POA: Diagnosis not present

## 2021-12-17 DIAGNOSIS — M9902 Segmental and somatic dysfunction of thoracic region: Secondary | ICD-10-CM | POA: Diagnosis not present

## 2021-12-17 DIAGNOSIS — M5412 Radiculopathy, cervical region: Secondary | ICD-10-CM | POA: Diagnosis not present

## 2021-12-21 DIAGNOSIS — M9901 Segmental and somatic dysfunction of cervical region: Secondary | ICD-10-CM | POA: Diagnosis not present

## 2021-12-21 DIAGNOSIS — M5412 Radiculopathy, cervical region: Secondary | ICD-10-CM | POA: Diagnosis not present

## 2021-12-21 DIAGNOSIS — M9903 Segmental and somatic dysfunction of lumbar region: Secondary | ICD-10-CM | POA: Diagnosis not present

## 2021-12-21 DIAGNOSIS — M9902 Segmental and somatic dysfunction of thoracic region: Secondary | ICD-10-CM | POA: Diagnosis not present

## 2021-12-23 ENCOUNTER — Other Ambulatory Visit: Payer: Self-pay | Admitting: Family Medicine

## 2021-12-24 DIAGNOSIS — M5412 Radiculopathy, cervical region: Secondary | ICD-10-CM | POA: Diagnosis not present

## 2021-12-24 DIAGNOSIS — M9902 Segmental and somatic dysfunction of thoracic region: Secondary | ICD-10-CM | POA: Diagnosis not present

## 2021-12-24 DIAGNOSIS — M9903 Segmental and somatic dysfunction of lumbar region: Secondary | ICD-10-CM | POA: Diagnosis not present

## 2021-12-24 DIAGNOSIS — M9901 Segmental and somatic dysfunction of cervical region: Secondary | ICD-10-CM | POA: Diagnosis not present

## 2021-12-28 DIAGNOSIS — L578 Other skin changes due to chronic exposure to nonionizing radiation: Secondary | ICD-10-CM | POA: Diagnosis not present

## 2021-12-28 DIAGNOSIS — C44622 Squamous cell carcinoma of skin of right upper limb, including shoulder: Secondary | ICD-10-CM | POA: Diagnosis not present

## 2021-12-28 DIAGNOSIS — L570 Actinic keratosis: Secondary | ICD-10-CM | POA: Diagnosis not present

## 2021-12-28 DIAGNOSIS — R229 Localized swelling, mass and lump, unspecified: Secondary | ICD-10-CM | POA: Diagnosis not present

## 2021-12-28 DIAGNOSIS — C44729 Squamous cell carcinoma of skin of left lower limb, including hip: Secondary | ICD-10-CM | POA: Diagnosis not present

## 2021-12-28 DIAGNOSIS — Z08 Encounter for follow-up examination after completed treatment for malignant neoplasm: Secondary | ICD-10-CM | POA: Diagnosis not present

## 2021-12-28 DIAGNOSIS — Z85828 Personal history of other malignant neoplasm of skin: Secondary | ICD-10-CM | POA: Diagnosis not present

## 2021-12-28 DIAGNOSIS — L821 Other seborrheic keratosis: Secondary | ICD-10-CM | POA: Diagnosis not present

## 2021-12-28 DIAGNOSIS — D485 Neoplasm of uncertain behavior of skin: Secondary | ICD-10-CM | POA: Diagnosis not present

## 2021-12-28 DIAGNOSIS — C44629 Squamous cell carcinoma of skin of left upper limb, including shoulder: Secondary | ICD-10-CM | POA: Diagnosis not present

## 2021-12-30 DIAGNOSIS — M9903 Segmental and somatic dysfunction of lumbar region: Secondary | ICD-10-CM | POA: Diagnosis not present

## 2021-12-30 DIAGNOSIS — M9902 Segmental and somatic dysfunction of thoracic region: Secondary | ICD-10-CM | POA: Diagnosis not present

## 2021-12-30 DIAGNOSIS — M5412 Radiculopathy, cervical region: Secondary | ICD-10-CM | POA: Diagnosis not present

## 2021-12-30 DIAGNOSIS — M9901 Segmental and somatic dysfunction of cervical region: Secondary | ICD-10-CM | POA: Diagnosis not present

## 2022-01-06 ENCOUNTER — Ambulatory Visit (INDEPENDENT_AMBULATORY_CARE_PROVIDER_SITE_OTHER): Payer: Medicare Other

## 2022-01-06 VITALS — Wt 115.0 lb

## 2022-01-06 DIAGNOSIS — Z780 Asymptomatic menopausal state: Secondary | ICD-10-CM | POA: Diagnosis not present

## 2022-01-06 DIAGNOSIS — Z1231 Encounter for screening mammogram for malignant neoplasm of breast: Secondary | ICD-10-CM

## 2022-01-06 DIAGNOSIS — Z0000 Encounter for general adult medical examination without abnormal findings: Secondary | ICD-10-CM

## 2022-01-06 NOTE — Progress Notes (Signed)
? ?Subjective:  ? [name] Page is a 71 y.o. female who presents for Medicare Annual (Subsequent) preventive examination. ? ?Virtual Visit via Telephone Note ? ?I connected with  [name] on 01/06/22 at  3:30 PM EDT by telephone and verified that I am speaking with the correct person using two identifiers. ? ?Location: ?Patient: Home ?Provider: [facility] ?Persons participating in the virtual visit: patient/Nurse Health Advisor ?  ?I discussed the limitations, risks, security and privacy concerns of performing an evaluation and management service by telephone and the availability of in person appointments. The patient expressed understanding and agreed to proceed. ? ?Interactive audio and video telecommunications were attempted between this nurse and patient, however failed, due to patient having technical difficulties OR patient did not have access to video capability.  We continued and completed visit with audio only. ? ?Some vital signs may be absent or patient reported.  ? ?[name], LPN  ? ?Review of Systems    ? ?Cardiac Risk Factors include: advanced age (>39mn, >>40women);dyslipidemia;hypertension ? ?   ?Objective:  ?  ?Today's Vitals  ? 01/06/22 1528  ?Weight: 115 lb (52.2 kg)  ?PainSc: 7   ? ?Body mass index is 21.03 kg/m?. ? ? ?  01/06/2022  ?  3:41 PM 06/25/2021  ?  5:19 PM 10/01/2020  ?  2:33 PM 09/17/2020  ?  4:14 PM 09/17/2020  ?  3:50 PM 09/11/2020  ?  5:00 PM 09/07/2020  ?  6:00 AM  ?Advanced Directives  ?Does Patient Have a Medical Advance Directive? Yes Yes Yes Yes Yes No Yes  ?[place]   Living will Living will;Healthcare Power of Attorney Living will Living will  ?Does patient want to make changes to medical advance directive?    No - Patient declined  No - Patient declined No - Patient declined  ?Copy of [place]? Yes - validated most recent copy scanned in chart (See row information)    No - copy  requested    ? ? ?Current Medications (verified) ?Outpatient Encounter Medications as of 01/06/2022  ?Medication Sig  ? acetaminophen (TYLENOL) 500 MG tablet Take 500 mg by mouth every 6 (six) hours as needed.  ? alendronate (FOSAMAX) 70 MG tablet Take 1 tablet (70 mg total) by mouth every 7 (seven) days. Take with a full glass of water on an empty stomach.  ? Calcium Carbonate-Vit D-Min (CALCIUM 1200 PO) Take 1 tablet by mouth daily.  ? cyclobenzaprine (FLEXERIL) 5 MG tablet Take 1 tablet (5 mg total) by mouth 3 (three) times daily as needed for muscle spasms.  ? escitalopram (LEXAPRO) 10 MG tablet Take 1 tablet (10 mg total) by mouth daily.  ? fluorouracil (EFUDEX) 5 % cream Apply topically 2 (two) times daily.  ? Glycerin-Hypromellose-PEG 400 0.2-0.2-1 % SOLN Apply 1 drop to eye 2 (two) times daily as needed (dry eyes).   ? ibuprofen (ADVIL) 200 MG tablet Take 200 mg by mouth every 6 (six) hours as needed.  ? Multiple Vitamin (MULTIVITAMIN WITH MINERALS) TABS tablet Take 1 tablet by mouth daily.  ? rosuvastatin (CRESTOR) 10 MG tablet Take 1 tablet (10 mg total) by mouth daily.  ? [DISCONTINUED] cetirizine (ZYRTEC) 10 MG tablet Take 10 mg by mouth daily.  ? [DISCONTINUED] ERIVEDGE 150 MG capsule Take 150 mg by mouth daily.  ? [DISCONTINUED] indapamide (LOZOL) 2.5 MG tablet Take 2.5 mg by mouth daily. (Patient not taking:  Reported on 01/06/2022)  ? ?No facility-administered encounter medications on file as of 01/06/2022.  ? ? ?Allergies (verified) ?Sulfa antibiotics  ? ?History: ?Past Medical History:  ?Diagnosis Date  ? Arthritis   ? Basal cell carcinoma   ? 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 sees Dr Allyson Sabal  ? Cancer [facility])   ? melanoma  ? Dysuria   ? Frequency of urination   ? Hematuria   ? History of kidney stones   ? Meningioma ([place]) 09/04/2020  ? Osteoporosis   ? Renal calculus, bilateral   ? 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 Urgency of urination   ? Wears glasses   ? ?Past Surgical  History:  ?Procedure Laterality Date  ? APPLICATION OF CRANIAL NAVIGATION Right 09/11/2020  ? Procedure: APPLICATION OF CRANIAL NAVIGATION;  Surgeon: Vallarie Mare, MD;  Location: [place];  Service: Neurosurgery;  Laterality: Right;  ? CRANIOTOMY Right 09/11/2020  ? Procedure: Right Pterional Craniotomy for resection of anterior skull base mass with Brain Lab;  Surgeon: Vallarie Mare, MD;  Location: [place];  Service: Neurosurgery;  Laterality: Right;  ? CYSTOSCOPY WITH RETROGRADE PYELOGRAM, URETEROSCOPY AND STENT PLACEMENT Bilateral 08/11/2013  ? Procedure: CYSTOSCOPY WITH RETROGRADE PYELOGRAM, URETEROSCOPY AND STENT PLACEMENT;  Surgeon: Alexis Frock, MD;  Location: [facility];  Service: Urology;  Laterality: Bilateral;  ? CYSTOSCOPY WITH URETEROSCOPY AND STENT PLACEMENT Bilateral 08/29/2013  ? Procedure: CYSTOSCOPY WITH BILATERAL URETEROSCOPY AND STENT REPLACEMENTS, stone extraction;  Surgeon: Alexis Frock, MD;  Location: [facility];  Service: Urology;  Laterality: Bilateral;  ? CYSTOSCOPY/RETROGRADE/URETEROSCOPY/STONE EXTRACTION WITH BASKET Bilateral 01/31/2016  ? Procedure: CYSTOSCOPY/BILATERAL RETROGRADE PYELOGRAMS/BILATERAL URETEROSCOPY/BILATERAL STONE EXTRACTION WITH BASKET/RIGHT URETERAL STENT PLACEMENT;  Surgeon: Raynelle Bring, MD;  Location: [facility];  Service: Urology;  Laterality: Bilateral;  ? HEMIARTHROPLASTY SHOULDER FRACTURE Left 1992  ? HOLMIUM LASER APPLICATION Bilateral 51/10/5850  ? Procedure: HOLMIUM LASER APPLICATION;  Surgeon: Alexis Frock, MD;  Location: [facility];  Service: Urology;  Laterality: Bilateral;  ? ?Family History  ?Problem Relation Age of Onset  ? Cancer Mother   ?     unknown source, stomach and liver   ? Atrial fibrillation Father   ? Hyperlipidemia Father   ? Hypertension Father   ? Colon polyps Father   ? Hypertension Sister   ? Hyperlipidemia Sister   ? Thyroid disease Sister   ? Esophageal cancer Neg Hx   ? Pancreatic cancer Neg Hx    ? ?Social History  ? ?Socioeconomic History  ? Marital status: Divorced  ?  Spouse name: Not on file  ? Number of children: 1  ? Years of education: 72  ? Highest education level: Not on file  ?Occupational History  ? Occupation: retired  ?Tobacco Use  ? Smoking status: Never  ? Smokeless tobacco: Never  ?Vaping Use  ? Vaping Use: Never used  ?Substance and Sexual Activity  ? Alcohol use: No  ? Drug use: No  ? Sexual activity: Not Currently  ?Other Topics Concern  ? Not on file  ?Social History Narrative  ? Patient resides locally.  She is currently separated from her spouse.  She is living in a mobile home near her daughter and 2 grandchildren and her father.  ? ?Social Determinants of Health  ? ?Financial Resource Strain: Low Risk   ? Difficulty of Paying Living Expenses: Not hard at all  ?Food Insecurity: No Food Insecurity  ? Worried About Running  Out of Food in the Last Year: Never true  ? Ran Out of Food in the Last Year: Never true  ?Transportation Needs: No Transportation Needs  ? Lack of Transportation (Medical): No  ? Lack of Transportation (Non-Medical): No  ?Physical Activity: Sufficiently Active  ? Days of Exercise per Week: 5 days  ? Minutes of Exercise per Session: 30 min  ?Stress: No Stress Concern Present  ? Feeling of Stress : Not at all  ?Social Connections: Moderately Integrated  ? Frequency of Communication with Friends and Family: More than three times a week  ? Frequency of Social Gatherings with Friends and Family: More than three times a week  ? Attends Religious Services: More than 4 times per year  ? Active Member of Clubs or Organizations: Yes  ? Attends Archivist Meetings: More than 4 times per year  ? Marital Status: Separated  ? ? ?Tobacco Counseling ?Counseling given: Not Answered ? ? ?Clinical Intake: ? ?Pre-visit preparation completed: Yes ? ?Pain : 0-10 ?Pain Score: 7  ?Pain Type: Chronic pain ?Pain Location: Head ?Pain Orientation: Right ?Pain Descriptors / Indicators:  Aching, Sharp ?Pain Onset: More than a month ago ?Pain Frequency: Intermittent ? ?  ? ?BMI - recorded: 21.03 ?Nutritional Status: BMI of 19-24  Normal ?Nutritional Risks: None ?Diabetes: No ? ?How often do yo

## 2022-01-06 NOTE — Patient Instructions (Signed)
Ms. Redditt , ?Thank you for taking time to come for your Medicare Wellness Visit. I appreciate your ongoing commitment to your health goals. Please review the following plan we discussed and let me know if I can assist you in the future.  ? ?Screening recommendations/referrals: ?Colonoscopy: Done 2011 - recommended Repeat in 10 years *discuss with Dr Lajuana Ripple ?Mammogram: Done 11/08/2018 - recommended Repeat annually *ordered today - call [facility] to schedule soon ?Bone Density: Done 10/10/2019 - Repeat every 2 years *get this at your visit Friday ?Recommended yearly ophthalmology/optometry visit for glaucoma screening and checkup ?Recommended yearly dental visit for hygiene and checkup ? ?Vaccinations: ?Influenza vaccine: Done 07/20/2021 - Repeat annually  ?Pneumococcal vaccine: Done 10/30/2015 & 08/14/2018 ?Tdap vaccine: Done 10/21/2009 - Repeat in 10 years *past due ?Shingles vaccine: Due - Shingrix is 2 doses 2-6 months apart and over 90% effective     ?Covid-19:Done 10/13/2019, 11/15/2019, & 09/16/2020 ? ?Advanced directives: in chart ? ?Conditions/risks identified: Aim for 30 minutes of exercise or brisk walking, 6-8 glasses of water, and 5 servings of fruits and vegetables each day.  ? ?Next appointment: Follow up in one year for your annual wellness visit  ? ? ?Preventive Care 32 Years and Older, Female ?Preventive care refers to lifestyle choices and visits with your health care provider that can promote health and wellness. ?What does preventive care include? ?A yearly physical exam. This is also called an annual well check. ?Dental exams once or twice a year. ?Routine eye exams. Ask your health care provider how often you should have your eyes checked. ?Personal lifestyle choices, including: ?Daily care of your teeth and gums. ?Regular physical activity. ?Eating a healthy diet. ?Avoiding tobacco and drug use. ?Limiting alcohol use. ?Practicing safe sex. ?Taking low-dose aspirin every day. ?Taking vitamin and mineral  supplements as recommended by your health care provider. ?What happens during an annual well check? ?The services and screenings done by your health care provider during your annual well check will depend on your age, overall health, lifestyle risk factors, and family history of disease. ?Counseling  ?Your health care provider may ask you questions about your: ?Alcohol use. ?Tobacco use. ?Drug use. ?Emotional well-being. ?Home and relationship well-being. ?Sexual activity. ?Eating habits. ?History of falls. ?Memory and ability to understand (cognition). ?Work and work Statistician. ?Reproductive health. ?Screening  ?You may have the following tests or measurements: ?Height, weight, and BMI. ?Blood pressure. ?Lipid and cholesterol levels. These may be checked every 5 years, or more frequently if you are over 28 years old. ?Skin check. ?Lung cancer screening. You may have this screening every year starting at age 39 if you have a 30-pack-year history of smoking and currently smoke or have quit within the past 15 years. ?Fecal occult blood test (FOBT) of the stool. You may have this test every year starting at age 4. ?Flexible sigmoidoscopy or colonoscopy. You may have a sigmoidoscopy every 5 years or a colonoscopy every 10 years starting at age 39. ?Hepatitis C blood test. ?Hepatitis B blood test. ?Sexually transmitted disease (STD) testing. ?Diabetes screening. This is done by checking your blood sugar (glucose) after you have not eaten for a while (fasting). You may have this done every 1-3 years. ?Bone density scan. This is done to screen for osteoporosis. You may have this done starting at age 79. ?Mammogram. This may be done every 1-2 years. Talk to your health care provider about how often you should have regular mammograms. ?Talk with your health care provider about  your test results, treatment options, and if necessary, the need for more tests. ?Vaccines  ?Your health care provider may recommend certain  vaccines, such as: ?Influenza vaccine. This is recommended every year. ?Tetanus, diphtheria, and acellular pertussis (Tdap, Td) vaccine. You may need a Td booster every 10 years. ?Zoster vaccine. You may need this after age 16. ?Pneumococcal 13-valent conjugate (PCV13) vaccine. One dose is recommended after age 66. ?Pneumococcal polysaccharide (PPSV23) vaccine. One dose is recommended after age 63. ?Talk to your health care provider about which screenings and vaccines you need and how often you need them. ?This information is not intended to replace advice given to you by your health care provider. Make sure you discuss any questions you have with your health care provider. ?Document Released: 10/03/2015 Document Revised: 05/26/2016 Document Reviewed: 07/08/2015 ?Elsevier Interactive Patient Education ? 2017 [place]. ? ?Fall Prevention in the Home ?Falls can cause injuries. They can happen to people of all ages. There are many things you can do to make your home safe and to help prevent falls. ?What can I do on the outside of my home? ?Regularly fix the edges of walkways and driveways and fix any cracks. ?Remove anything that might make you trip as you walk through a door, such as a raised step or threshold. ?Trim any bushes or trees on the path to your home. ?Use bright outdoor lighting. ?Clear any walking paths of anything that might make someone trip, such as rocks or tools. ?Regularly check to see if handrails are loose or broken. Make sure that both sides of any steps have handrails. ?Any raised decks and porches should have guardrails on the edges. ?Have any leaves, snow, or ice cleared regularly. ?Use sand or salt on walking paths during winter. ?Clean up any spills in your garage right away. This includes oil or grease spills. ?What can I do in the bathroom? ?Use night lights. ?Install grab bars by the toilet and in the tub and shower. Do not use towel bars as grab bars. ?Use non-skid mats or decals in  the tub or shower. ?If you need to sit down in the shower, use a plastic, non-slip stool. ?Keep the floor dry. Clean up any water that spills on the floor as soon as it happens. ?Remove soap buildup in the tub or shower regularly. ?Attach bath mats securely with double-sided non-slip rug tape. ?Do not have throw rugs and other things on the floor that can make you trip. ?What can I do in the bedroom? ?Use night lights. ?Make sure that you have a light by your bed that is easy to reach. ?Do not use any sheets or blankets that are too big for your bed. They should not hang down onto the floor. ?Have a firm chair that has side arms. You can use this for support while you get dressed. ?Do not have throw rugs and other things on the floor that can make you trip. ?What can I do in the kitchen? ?Clean up any spills right away. ?Avoid walking on wet floors. ?Keep items that you use a lot in easy-to-reach places. ?If you need to reach something above you, use a strong step stool that has a grab bar. ?Keep electrical cords out of the way. ?Do not use floor polish or wax that makes floors slippery. If you must use wax, use non-skid floor wax. ?Do not have throw rugs and other things on the floor that can make you trip. ?What can I do with  my stairs? ?Do not leave any items on the stairs. ?Make sure that there are handrails on both sides of the stairs and use them. Fix handrails that are broken or loose. Make sure that handrails are as long as the stairways. ?Check any carpeting to make sure that it is firmly attached to the stairs. Fix any carpet that is loose or worn. ?Avoid having throw rugs at the top or bottom of the stairs. If you do have throw rugs, attach them to the floor with carpet tape. ?Make sure that you have a light switch at the top of the stairs and the bottom of the stairs. If you do not have them, ask someone to add them for you. ?What else can I do to help prevent falls? ?Wear shoes that: ?Do not have high  heels. ?Have rubber bottoms. ?Are comfortable and fit you well. ?Are closed at the toe. Do not wear sandals. ?If you use a stepladder: ?Make sure that it is fully opened. Do not climb a closed stepladder. ?

## 2022-01-07 DIAGNOSIS — M9902 Segmental and somatic dysfunction of thoracic region: Secondary | ICD-10-CM | POA: Diagnosis not present

## 2022-01-07 DIAGNOSIS — M5412 Radiculopathy, cervical region: Secondary | ICD-10-CM | POA: Diagnosis not present

## 2022-01-07 DIAGNOSIS — M9901 Segmental and somatic dysfunction of cervical region: Secondary | ICD-10-CM | POA: Diagnosis not present

## 2022-01-07 DIAGNOSIS — Z20822 Contact with and (suspected) exposure to covid-19: Secondary | ICD-10-CM | POA: Diagnosis not present

## 2022-01-07 DIAGNOSIS — M9903 Segmental and somatic dysfunction of lumbar region: Secondary | ICD-10-CM | POA: Diagnosis not present

## 2022-01-08 ENCOUNTER — Encounter: Payer: Self-pay | Admitting: Family Medicine

## 2022-01-08 ENCOUNTER — Ambulatory Visit (INDEPENDENT_AMBULATORY_CARE_PROVIDER_SITE_OTHER): Payer: Medicare Other | Admitting: Family Medicine

## 2022-01-08 VITALS — BP 145/76 | HR 64 | Temp 98.5°F | Ht 62.0 in | Wt 116.2 lb

## 2022-01-08 DIAGNOSIS — F32A Depression, unspecified: Secondary | ICD-10-CM | POA: Diagnosis not present

## 2022-01-08 DIAGNOSIS — I10 Essential (primary) hypertension: Secondary | ICD-10-CM

## 2022-01-08 DIAGNOSIS — R251 Tremor, unspecified: Secondary | ICD-10-CM

## 2022-01-08 DIAGNOSIS — M2550 Pain in unspecified joint: Secondary | ICD-10-CM | POA: Diagnosis not present

## 2022-01-08 DIAGNOSIS — C4490 Unspecified malignant neoplasm of skin, unspecified: Secondary | ICD-10-CM

## 2022-01-08 LAB — HEMOGLOBIN, FINGERSTICK: Hemoglobin: 11.5 g/dL (ref 11.1–15.9)

## 2022-01-08 MED ORDER — CELECOXIB 100 MG PO CAPS: 100.0000 mg | ORAL_CAPSULE | Freq: Two times a day (BID) | ORAL | 0 refills | Status: DC | PRN

## 2022-01-08 MED ORDER — ESCITALOPRAM OXALATE 10 MG PO TABS: ORAL_TABLET | ORAL | 0 refills | Status: DC

## 2022-01-08 NOTE — Progress Notes (Signed)
? ?Subjective: ?CC: Follow-up depressive disorder ?PCP: Janora Norlander, DO ?[name] Page is a 71 y.o. female presenting to clinic today for: ? ?1.  Depressive disorder ?Patient reports depressive disorder has been very well controlled and she would like to come off of the Lexapro now.  Asking for instructions on how to wean ? ?2.  Tremor ?Patient has had a tremor in the left hand for some time now.  Her parents had similar.  She is right-hand dominant and it does not currently affect her ability to eat or do other activities but she wanted to make mention of it.  No known family history of Parkinson disease. ? ?3.  Skin cancer ?Patient is status post treatment with chemotherapy and has had multiple skin lesions removed.  She is seeing her dermatologist every 3 months and has a few more lesions to have removed soon. ? ?4.  Polyarthralgia ?Patient with polyarthralgia.  This is refractory to Tylenol arthritis, OTC Advil and various OTC rubs.  Asking for alternatives.  Has had renal impairment in the past but last couple of blood checks were normal.  Never had GI bleeding.  Denies any warm, red or swollen joints but she does have some deformity of the joints. ? ? ?ROS: Per HPI ? ?Allergies  ?Allergen Reactions  ? Sulfa Antibiotics Rash  ? ?Past Medical History:  ?Diagnosis Date  ? Arthritis   ? Basal cell carcinoma   ? 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 sees Dr Allyson Sabal  ? Cancer [facility])   ? melanoma  ? Dysuria   ? Frequency of urination   ? Hematuria   ? History of kidney stones   ? Meningioma ([place]) 09/04/2020  ? Osteoporosis   ? Renal calculus, bilateral   ? 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 Urgency of urination   ? Wears glasses   ? ? ?Current Outpatient Medications:  ?  acetaminophen (TYLENOL) 500 MG tablet, Take 500 mg by mouth every 6 (six) hours as needed., Disp: , Rfl:  ?  alendronate (FOSAMAX) 70 MG tablet, Take 1 tablet (70 mg total) by mouth every 7 (seven) days. Take  with a full glass of water on an empty stomach., Disp: 12 tablet, Rfl: 4 ?  Calcium Carbonate-Vit D-Min (CALCIUM 1200 PO), Take 1 tablet by mouth daily., Disp: , Rfl:  ?  cyclobenzaprine (FLEXERIL) 5 MG tablet, Take 1 tablet (5 mg total) by mouth 3 (three) times daily as needed for muscle spasms., Disp: 30 tablet, Rfl: 0 ?  escitalopram (LEXAPRO) 10 MG tablet, Take 1 tablet (10 mg total) by mouth daily., Disp: 90 tablet, Rfl: 0 ?  Glycerin-Hypromellose-PEG 400 0.2-0.2-1 % SOLN, Apply 1 drop to eye 2 (two) times daily as needed (dry eyes). , Disp: , Rfl:  ?  ibuprofen (ADVIL) 200 MG tablet, Take 200 mg by mouth every 6 (six) hours as needed., Disp: , Rfl:  ?  Multiple Vitamin (MULTIVITAMIN WITH MINERALS) TABS tablet, Take 1 tablet by mouth daily., Disp: , Rfl:  ?  rosuvastatin (CRESTOR) 10 MG tablet, Take 1 tablet (10 mg total) by mouth daily., Disp: 90 tablet, Rfl: 3 ?  fluorouracil (EFUDEX) 5 % cream, Apply topically 2 (two) times daily. (Patient not taking: Reported on 01/08/2022), Disp: , Rfl:  ?Social History  ? ?Socioeconomic History  ? Marital status: Divorced  ?  Spouse name: Not on file  ? Number of children: 1  ? Years of  education: 12  ? Highest education level: Not on file  ?Occupational History  ? Occupation: retired  ?Tobacco Use  ? Smoking status: Never  ? Smokeless tobacco: Never  ?Vaping Use  ? Vaping Use: Never used  ?Substance and Sexual Activity  ? Alcohol use: No  ? Drug use: No  ? Sexual activity: Not Currently  ?Other Topics Concern  ? Not on file  ?Social History Narrative  ? Patient resides locally.  She is currently separated from her spouse.  She is living in a mobile home near her daughter and 2 grandchildren and her father.  ? ?Social Determinants of Health  ? ?Financial Resource Strain: Low Risk   ? Difficulty of Paying Living Expenses: Not hard at all  ?Food Insecurity: No Food Insecurity  ? Worried About Charity fundraiser in the Last Year: Never true  ? Ran Out of Food in the Last  Year: Never true  ?Transportation Needs: No Transportation Needs  ? Lack of Transportation (Medical): No  ? Lack of Transportation (Non-Medical): No  ?Physical Activity: Sufficiently Active  ? Days of Exercise per Week: 5 days  ? Minutes of Exercise per Session: 30 min  ?Stress: No Stress Concern Present  ? Feeling of Stress : Not at all  ?Social Connections: Moderately Integrated  ? Frequency of Communication with Friends and Family: More than three times a week  ? Frequency of Social Gatherings with Friends and Family: More than three times a week  ? Attends Religious Services: More than 4 times per year  ? Active Member of Clubs or Organizations: Yes  ? Attends Archivist Meetings: More than 4 times per year  ? Marital Status: Separated  ?Intimate Partner Violence: Not At Risk  ? Fear of Current or Ex-Partner: No  ? Emotionally Abused: No  ? Physically Abused: No  ? Sexually Abused: No  ? ?Family History  ?Problem Relation Age of Onset  ? Cancer Mother   ?     unknown source, stomach and liver   ? Atrial fibrillation Father   ? Hyperlipidemia Father   ? Hypertension Father   ? Colon polyps Father   ? Hypertension Sister   ? Hyperlipidemia Sister   ? Thyroid disease Sister   ? Esophageal cancer Neg Hx   ? Pancreatic cancer Neg Hx   ? ? ?Objective: ?Office vital signs reviewed. ?BP (!) 145/76   Pulse 64   Temp 98.5 ?F (36.9 ?C)   Ht '5\' 2"'$  (1.575 m)   Wt 116 lb 3.2 oz (52.7 kg)   SpO2 94%   BMI 21.25 kg/m?  ? ?Physical Examination:  ?General: Awake, alert, thin, No acute distress ?HEENT: Sclera white.  Moist mucous membranes.  Post chemo alopecia ?Cardio: regular rate and rhythm, S1S2 heard, no murmurs appreciated ?Pulm: clear to auscultation bilaterally, no wheezes, rhonchi or rales; normal work of breathing on room air ?MSK: Ambulating independently.  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 ?Neuro: Mild tremor of the  left hand noted ?Skin: Several scaly skin lesions.  She has a lesion that is postsurgical in the left forearm and right elbow. ? ?Assessment/ Plan: ?71 y.o. female  ? ?Skin cancer - Plan: Hemoglobin, fingerstick ? ?Essential hypertension - Plan: Basic metabolic panel ? ?Tremor ? ?Polyarthralgia - Plan: celecoxib (CELEBREX) 100 MG capsule ? ?Depressive disorder - Plan: escitalopram (LEXAPRO) 10 MG tablet ? ?Continues to be treated for skin cancer.  Biopsy is expected again soon.  Status post treatment with chemotherapy. ? ?Blood pressure is controlled upon recheck. ? ?We discussed possible treatments for tremor including beta-blockers, primidone.  She seems reluctant to start anything but wanted to make mention of it today.  Sounds like right now is primarily affecting her left hand and there is no family history concerning for Parkinson ? ?Trial of Celebrex.  No apparent contraindications to use.  We discussed avoiding other NSAIDs.  Consuming food when taking and plenty of water.  She has a questionable history of CKD but her last couple of renal checks showed GFR of greater than 60.  Because were utilizing an NSAID, I did recommend that we repeat BMP within 1 week of use to ensure that we are not exacerbating any renal dysfunction.  May need to consider utilization of Motrin ongoing if renal dysfunction noted with Celebrex as this particular NSAID has been found to be a little less nephrotoxic than the others ? ?For her depressive disorder she feels well controlled and wants to come off of medication.  We discussed weaning.  She will take 1/2 tablet daily for 4 weeks then 1/2 tablet every other day for 2 weeks then discontinue the Lexapro ? ?No orders of the defined types were placed in this encounter. ? ?No orders of the defined types were placed in this encounter. ? ? ? ?Janora Norlander, DO ?[place] ?[phone] ? ? ?

## 2022-01-08 NOTE — Patient Instructions (Signed)
1/2 tab lexapro x4 weeks ?Then 1/2 tablet every other day x2 weeks.  ?Then stop ? ?Essential Tremor ?A tremor is trembling or shaking that a person cannot control. Most tremors affect the hands or arms. Tremors can also affect the head, vocal cords, legs, and other parts of the body. Essential tremor is a tremor without a known cause. Usually, it occurs while a person is trying to perform an action. It tends to get worse gradually as a person ages. ?What are the causes? ?The cause of this condition is not known, but it often runs in families. ?What increases the risk? ?You are more likely to develop this condition if: ?You have a family member with essential tremor. ?You are 61 years of age or older. ?What are the signs or symptoms? ?The main sign of a tremor is a rhythmic shaking of certain parts of your body that is uncontrolled and unintentional. You may: ?Have difficulty eating with a spoon or fork. ?Have difficulty writing. ?Nod your head up and down or side to side. ?Have a quivering voice. ?The shaking may: ?Get worse over time. ?Come and go. ?Be more noticeable on one side of your body. ?Get worse due to stress, tiredness (fatigue), caffeine, and extreme heat or cold. ?How is this diagnosed? ?This condition may be diagnosed based on: ?Your symptoms and medical history. ?A physical exam. ?There is no single test to diagnose an essential tremor. However, your health care provider may order tests to rule out other causes of your condition. These may include: ?Blood and urine tests. ?Imaging studies of your brain, such as a CT scan or MRI. ?How is this treated? ?Treatment for essential tremor depends on the severity of the condition. ?Mild tremors may not need treatment if they do not affect your day-to-day life. ?Severe tremors may need to be treated using one or more of the following options: ?Medicines. ?Injections of a substance called botulinum toxin. ?Procedures such as deep brain stimulation (DBS)  implantation or MRI-guided ultrasound treatment. ?Lifestyle changes. ?Occupational or physical therapy. ?Follow these instructions at home: ?Lifestyle ? ?Do not use any products that contain nicotine or tobacco. These products include cigarettes, chewing tobacco, and vaping devices, such as e-cigarettes. If you need help quitting, ask your health care provider. ?Limit your caffeine intake as told by your health care provider. ?Try to get 8 hours of sleep each night. ?Find ways to manage your stress that fit your lifestyle and personality. Consider trying meditation or yoga. ?Try to anticipate stressful situations and allow extra time to manage them. ?If you are struggling emotionally with the effects of your tremor, consider working with a mental health provider. ?General instructions ?Take over-the-counter and prescription medicines only as told by your health care provider. ?Avoid extreme heat and extreme cold. ?Keep all follow-up visits. This is important. Visits may include physical therapy visits. ?Where to find more information ?[employer] of Neurological Disorders and Stroke: [employer]it ?Contact a health care provider if: ?You experience any changes in the location or intensity of your tremors. ?You start having a tremor after starting a new medicine. ?You have a tremor with other symptoms, such as: ?Numbness. ?Tingling. ?Pain. ?Weakness. ?Your tremor gets worse. ?Your tremor interferes with your daily life. ?You feel down, blue, or sad for at least 2 weeks in a row. ?Worrying about your tremor and what other people think about you interferes with your everyday life functions, including relationships, work, or school. ?Summary ?Essential tremor is a tremor without a  known cause. Usually, it occurs when you are trying to perform an action. ?You are more likely to develop this condition if you have a family member with essential tremor. ?The main sign of a tremor is a rhythmic shaking of certain  parts of your body that is uncontrolled and unintentional. ?Treatment for essential tremor depends on the severity of the condition. ?This information is not intended to replace advice given to you by your health care provider. Make sure you discuss any questions you have with your health care provider. ?Document Revised: 06/26/2021 Document Reviewed: 06/26/2021 ?Elsevier Patient Education ? [place]. ? ?

## 2022-01-11 DIAGNOSIS — I872 Venous insufficiency (chronic) (peripheral): Secondary | ICD-10-CM | POA: Diagnosis not present

## 2022-01-11 DIAGNOSIS — C44729 Squamous cell carcinoma of skin of left lower limb, including hip: Secondary | ICD-10-CM | POA: Diagnosis not present

## 2022-01-20 ENCOUNTER — Ambulatory Visit (INDEPENDENT_AMBULATORY_CARE_PROVIDER_SITE_OTHER): Payer: Medicare Other

## 2022-01-20 DIAGNOSIS — Z780 Asymptomatic menopausal state: Secondary | ICD-10-CM

## 2022-01-20 DIAGNOSIS — M810 Age-related osteoporosis without current pathological fracture: Secondary | ICD-10-CM | POA: Diagnosis not present

## 2022-01-23 DIAGNOSIS — Z20822 Contact with and (suspected) exposure to covid-19: Secondary | ICD-10-CM | POA: Diagnosis not present

## 2022-02-04 DIAGNOSIS — M9902 Segmental and somatic dysfunction of thoracic region: Secondary | ICD-10-CM | POA: Diagnosis not present

## 2022-02-04 DIAGNOSIS — M9901 Segmental and somatic dysfunction of cervical region: Secondary | ICD-10-CM | POA: Diagnosis not present

## 2022-02-04 DIAGNOSIS — M5412 Radiculopathy, cervical region: Secondary | ICD-10-CM | POA: Diagnosis not present

## 2022-02-04 DIAGNOSIS — M9903 Segmental and somatic dysfunction of lumbar region: Secondary | ICD-10-CM | POA: Diagnosis not present

## 2022-02-09 ENCOUNTER — Other Ambulatory Visit: Payer: Self-pay | Admitting: Family Medicine

## 2022-02-09 DIAGNOSIS — M2550 Pain in unspecified joint: Secondary | ICD-10-CM

## 2022-02-10 DIAGNOSIS — D0462 Carcinoma in situ of skin of left upper limb, including shoulder: Secondary | ICD-10-CM | POA: Diagnosis not present

## 2022-02-11 DIAGNOSIS — M9902 Segmental and somatic dysfunction of thoracic region: Secondary | ICD-10-CM | POA: Diagnosis not present

## 2022-02-11 DIAGNOSIS — M9901 Segmental and somatic dysfunction of cervical region: Secondary | ICD-10-CM | POA: Diagnosis not present

## 2022-02-11 DIAGNOSIS — M9903 Segmental and somatic dysfunction of lumbar region: Secondary | ICD-10-CM | POA: Diagnosis not present

## 2022-02-11 DIAGNOSIS — M5412 Radiculopathy, cervical region: Secondary | ICD-10-CM | POA: Diagnosis not present

## 2022-02-17 DIAGNOSIS — Z1231 Encounter for screening mammogram for malignant neoplasm of breast: Secondary | ICD-10-CM | POA: Diagnosis not present

## 2022-02-18 DIAGNOSIS — M9903 Segmental and somatic dysfunction of lumbar region: Secondary | ICD-10-CM | POA: Diagnosis not present

## 2022-02-18 DIAGNOSIS — M9902 Segmental and somatic dysfunction of thoracic region: Secondary | ICD-10-CM | POA: Diagnosis not present

## 2022-02-18 DIAGNOSIS — M5412 Radiculopathy, cervical region: Secondary | ICD-10-CM | POA: Diagnosis not present

## 2022-02-18 DIAGNOSIS — M9901 Segmental and somatic dysfunction of cervical region: Secondary | ICD-10-CM | POA: Diagnosis not present

## 2022-02-24 ENCOUNTER — Other Ambulatory Visit: Payer: Self-pay | Admitting: Neurosurgery

## 2022-02-24 DIAGNOSIS — D0461 Carcinoma in situ of skin of right upper limb, including shoulder: Secondary | ICD-10-CM | POA: Diagnosis not present

## 2022-02-24 DIAGNOSIS — L578 Other skin changes due to chronic exposure to nonionizing radiation: Secondary | ICD-10-CM | POA: Diagnosis not present

## 2022-02-24 DIAGNOSIS — D329 Benign neoplasm of meninges, unspecified: Secondary | ICD-10-CM

## 2022-02-24 DIAGNOSIS — L570 Actinic keratosis: Secondary | ICD-10-CM | POA: Diagnosis not present

## 2022-03-04 DIAGNOSIS — M9902 Segmental and somatic dysfunction of thoracic region: Secondary | ICD-10-CM | POA: Diagnosis not present

## 2022-03-04 DIAGNOSIS — M5412 Radiculopathy, cervical region: Secondary | ICD-10-CM | POA: Diagnosis not present

## 2022-03-04 DIAGNOSIS — M9901 Segmental and somatic dysfunction of cervical region: Secondary | ICD-10-CM | POA: Diagnosis not present

## 2022-03-04 DIAGNOSIS — M9903 Segmental and somatic dysfunction of lumbar region: Secondary | ICD-10-CM | POA: Diagnosis not present

## 2022-03-14 ENCOUNTER — Ambulatory Visit
Admission: RE | Admit: 2022-03-14 | Discharge: 2022-03-14 | Disposition: A | Discharge status: Home / Self Care | Payer: Medicare Other | Source: Ambulatory Visit | Attending: Neurosurgery | Admitting: Neurosurgery

## 2022-03-14 DIAGNOSIS — D329 Benign neoplasm of meninges, unspecified: Secondary | ICD-10-CM

## 2022-03-14 DIAGNOSIS — G9389 Other specified disorders of brain: Secondary | ICD-10-CM | POA: Diagnosis not present

## 2022-03-14 DIAGNOSIS — R9082 White matter disease, unspecified: Secondary | ICD-10-CM | POA: Diagnosis not present

## 2022-03-14 MED ORDER — GADOBENATE DIMEGLUMINE 529 MG/ML IV SOLN
10.0000 mL | Freq: Once | INTRAVENOUS | Status: AC | PRN
  Administered 2022-03-14: 10 mL via INTRAVENOUS

## 2022-03-18 DIAGNOSIS — M9903 Segmental and somatic dysfunction of lumbar region: Secondary | ICD-10-CM | POA: Diagnosis not present

## 2022-03-18 DIAGNOSIS — M9901 Segmental and somatic dysfunction of cervical region: Secondary | ICD-10-CM | POA: Diagnosis not present

## 2022-03-18 DIAGNOSIS — M5412 Radiculopathy, cervical region: Secondary | ICD-10-CM | POA: Diagnosis not present

## 2022-03-18 DIAGNOSIS — M9902 Segmental and somatic dysfunction of thoracic region: Secondary | ICD-10-CM | POA: Diagnosis not present

## 2022-03-20 ENCOUNTER — Other Ambulatory Visit: Payer: Self-pay | Admitting: Family Medicine

## 2022-03-20 DIAGNOSIS — F32A Depression, unspecified: Secondary | ICD-10-CM

## 2022-03-22 DIAGNOSIS — D329 Benign neoplasm of meninges, unspecified: Secondary | ICD-10-CM | POA: Diagnosis not present

## 2022-03-22 DIAGNOSIS — M47812 Spondylosis without myelopathy or radiculopathy, cervical region: Secondary | ICD-10-CM | POA: Diagnosis not present

## 2022-03-31 DIAGNOSIS — D225 Melanocytic nevi of trunk: Secondary | ICD-10-CM | POA: Diagnosis not present

## 2022-03-31 DIAGNOSIS — Z08 Encounter for follow-up examination after completed treatment for malignant neoplasm: Secondary | ICD-10-CM | POA: Diagnosis not present

## 2022-03-31 DIAGNOSIS — Z85828 Personal history of other malignant neoplasm of skin: Secondary | ICD-10-CM | POA: Diagnosis not present

## 2022-03-31 DIAGNOSIS — M47812 Spondylosis without myelopathy or radiculopathy, cervical region: Secondary | ICD-10-CM | POA: Diagnosis not present

## 2022-03-31 DIAGNOSIS — R229 Localized swelling, mass and lump, unspecified: Secondary | ICD-10-CM | POA: Diagnosis not present

## 2022-03-31 DIAGNOSIS — C44612 Basal cell carcinoma of skin of right upper limb, including shoulder: Secondary | ICD-10-CM | POA: Diagnosis not present

## 2022-03-31 DIAGNOSIS — C44722 Squamous cell carcinoma of skin of right lower limb, including hip: Secondary | ICD-10-CM | POA: Diagnosis not present

## 2022-03-31 DIAGNOSIS — C44629 Squamous cell carcinoma of skin of left upper limb, including shoulder: Secondary | ICD-10-CM | POA: Diagnosis not present

## 2022-03-31 DIAGNOSIS — L821 Other seborrheic keratosis: Secondary | ICD-10-CM | POA: Diagnosis not present

## 2022-03-31 DIAGNOSIS — D485 Neoplasm of uncertain behavior of skin: Secondary | ICD-10-CM | POA: Diagnosis not present

## 2022-04-05 ENCOUNTER — Encounter: Payer: Self-pay | Admitting: Physical Therapy

## 2022-04-05 ENCOUNTER — Ambulatory Visit: Payer: Medicare Other | Attending: Neurosurgery | Admitting: Physical Therapy

## 2022-04-05 ENCOUNTER — Other Ambulatory Visit: Payer: Self-pay

## 2022-04-05 DIAGNOSIS — R293 Abnormal posture: Secondary | ICD-10-CM | POA: Diagnosis not present

## 2022-04-05 DIAGNOSIS — M542 Cervicalgia: Secondary | ICD-10-CM | POA: Insufficient documentation

## 2022-04-05 NOTE — Therapy (Signed)
OUTPATIENT PHYSICAL THERAPY CERVICAL EVALUATION   Patient Name: [name] Page MRN: [id number] DOB:January 01, 1951, 71 y.o., female Today's Date: 04/05/2022   PT End of Session - 04/05/22 1231     Visit Number 1    Number of Visits 12    Date for PT Re-Evaluation 07/04/22    Authorization Type FOTO AT LEAST EVERY 5TH VISIT.  PROGRESS NOTE AT 10TH VISIT.  KX MODIFIER AFTER 15 VISITS.    PT Start Time 1031    PT Stop Time 1116    PT Time Calculation (min) 45 min             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   sees Dr Allyson Sabal   Cancer [facility])    melanoma   Dysuria    Frequency of urination    Hematuria    History of kidney stones    Meningioma ([place]) 09/04/2020   Osteoporosis    Renal calculus, bilateral    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   Urgency of urination    Wears glasses    Past Surgical History:  Procedure Laterality Date   APPLICATION OF CRANIAL NAVIGATION Right 09/11/2020   Procedure: APPLICATION OF CRANIAL NAVIGATION;  Surgeon: Vallarie Mare, MD;  Location: [place];  Service: Neurosurgery;  Laterality: Right;   CRANIOTOMY Right 09/11/2020   Procedure: Right Pterional Craniotomy for resection of anterior skull base mass with Brain Lab;  Surgeon: Vallarie Mare, MD;  Location: [place];  Service: Neurosurgery;  Laterality: Right;   CYSTOSCOPY WITH RETROGRADE PYELOGRAM, URETEROSCOPY AND STENT PLACEMENT Bilateral 08/11/2013   Procedure: CYSTOSCOPY WITH RETROGRADE PYELOGRAM, URETEROSCOPY AND STENT PLACEMENT;  Surgeon: Alexis Frock, MD;  [facility];  Service: Urology;  Laterality: Bilateral;   CYSTOSCOPY WITH URETEROSCOPY AND STENT PLACEMENT Bilateral 08/29/2013   Procedure: CYSTOSCOPY WITH BILATERAL URETEROSCOPY AND STENT REPLACEMENTS, stone extraction;  Surgeon: Alexis Frock, MD;  Location: [facility];  Service: Urology;  Laterality:  Bilateral;   CYSTOSCOPY/RETROGRADE/URETEROSCOPY/STONE EXTRACTION WITH BASKET Bilateral 01/31/2016   Procedure: CYSTOSCOPY/BILATERAL RETROGRADE PYELOGRAMS/BILATERAL URETEROSCOPY/BILATERAL STONE EXTRACTION WITH BASKET/RIGHT URETERAL STENT PLACEMENT;  Surgeon: Raynelle Bring, MD;  [facility];  Service: Urology;  Laterality: Bilateral;   HEMIARTHROPLASTY SHOULDER FRACTURE Left 1992   HOLMIUM LASER APPLICATION Bilateral 29/47/6546   Procedure: HOLMIUM LASER APPLICATION;  Surgeon: Alexis Frock, MD;  Location: [facility];  Service: Urology;  Laterality: Bilateral;   Patient Active Problem List   Diagnosis Date Noted   History of resection of meningioma 11/02/2020   Peripheral edema    Hypoalbuminemia due to protein-calorie malnutrition (HCC)    Transaminitis    Tremor    Iron deficiency anemia    CKD (chronic kidney disease), stage III ([place]) 09/10/2020   Vasogenic Cerebral edema ([place]) 09/04/2020   Cancer of skin, squamous cell 09/04/2020   Nephrolithiasis 09/04/2020   Essential hypertension 11/01/2015   Osteoporosis 10/30/2015   REFERRING PROVIDER: Duffy Rhody MD  REFERRING DIAG: Cervical spondylosis without myelopathy.  THERAPY DIAG:  Cervicalgia  Abnormal posture  Rationale for Evaluation and Treatment Rehabilitation  ONSET DATE: "2-3 years ago".  SUBJECTIVE:  SUBJECTIVE STATEMENT: Th patient presents to the clinic today with c/o left-sided neck pain over the 2-3 three years.  She has had some Chiropractic treatment that included electrical stimulation and traction in the past.  She rates her pain at a 6/10 but can rise to higher levels with movement.  She has occasional headaches.    PERTINENT HISTORY:  Cancer (resolved), OP, left TSA, OA.  PAIN:  Are you  having pain? Yes: NPRS scale: 6/10 Pain location: Left cervical. Pain description: Ache, sore. Aggravating factors: Movement. Relieving factors: Rest.   PRECAUTIONS: Other: OP.  WEIGHT BEARING RESTRICTIONS No  FALLS:  Has patient fallen in last 6 months? No  LIVING ENVIRONMENT: Lives with: lives with their family and lives alone Lives in: House/apartment Has following equipment at home: None  OCCUPATION: Retired.  PLOF: Independent  PATIENT GOALS Reduce neck pain.  OBJECTIVE:  PATIENT SURVEYS:  FOTO Complete.   COGNITION: Overall cognitive status: Within functional limits for tasks assessed   POSTURE: rounded shoulders and forward head (3 inch forward head of rounded shoulders).  PALPATION: Tender and very taut to palpation over patient upper cervical paraspinal and UT musculature on the left.   CERVICAL ROM:   Active right cervical rotation is 25 degrees and left is 32 degrees.  UPPER EXTREMITY ROM:  Very limited left sholder AROM.  UPPER EXTREMITY MMT:  Right shoulder abduction and bilateral elbow strength is normal.  DTR's:  UE DTR's are normal.  TODAY'S TREATMENT:  HMP and low-level IFC at 80-150 Hz on 40% scan x 20 minutes to patient left UT.   PATIENT EDUCATION:  Education details: Chin tucks and extension. HOME EXERCISE PROGRAM: Chin tucks and extension.  ASSESSMENT:  CLINICAL IMPRESSION: The patient presents to [facility] with chronic left-sided neck pain.  She has very limited active bilateral cervical rotation.  Her posture is poor with a pronounced forward head and rounded shoulder.  Her left cervical musculature/UT is tender and very taut to palpation.  UE DTR's are normal.  Patient will benefit from skilled physical therapy intervention to address pain and deficits.  OBJECTIVE IMPAIRMENTS decreased ROM, increased muscle spasms, postural dysfunction, and pain.   ACTIVITY LIMITATIONS  ADL's.  PARTICIPATION LIMITATIONS: meal prep and yard  work  PERSONAL FACTORS Time since onset of injury/illness/exacerbation are also affecting patient's functional outcome.   REHAB POTENTIAL: Good  CLINICAL DECISION MAKING: Stable/uncomplicated  EVALUATION COMPLEXITY: Low   GOALS: Goals reviewed with patient? Yes  SHORT TERM GOALS: Target date: 04/19/2022   Independent with HEP. Baseline:  Goal status: INITIAL      LONG TERM GOALS: Target date: 05/17/2022  Increase active cervical rotation to 55 degrees+ so patient can turn head more easily while driving.  Goal status: INITIAL  2.  Eliminate headaches. Baseline:  Goal status: INITIAL  3.  Perform ADL's with neck pain not > 3/10. Baseline:  Goal status: INITIAL     PLAN: PT FREQUENCY: 2x/week  PT DURATION: 6 weeks  PLANNED INTERVENTIONS: Therapeutic exercises, Therapeutic activity, Patient/Family education, Self Care, Dry Needling, Electrical stimulation, Cryotherapy, Moist heat, Ultrasound, and Manual therapy  PLAN FOR NEXT SESSION: Modalities and STW/M, postural exercises, chin tucks with extension, towel extensions.   Raford Brissett, [place], PT 04/05/2022, 12:38 PM

## 2022-04-08 ENCOUNTER — Ambulatory Visit: Payer: Medicare Other

## 2022-04-08 DIAGNOSIS — R293 Abnormal posture: Secondary | ICD-10-CM | POA: Diagnosis not present

## 2022-04-08 DIAGNOSIS — M542 Cervicalgia: Secondary | ICD-10-CM

## 2022-04-08 NOTE — Therapy (Signed)
OUTPATIENT PHYSICAL THERAPY CERVICAL EVALUATION   Patient Name: [name] Page MRN: [id number] DOB:1951-06-30, 71 y.o., female Today's Date: 04/08/2022   PT End of Session - 04/08/22 1300     Visit Number 2    Number of Visits 12    Date for PT Re-Evaluation 07/04/22    Authorization Type FOTO AT LEAST EVERY 5TH VISIT.  PROGRESS NOTE AT 10TH VISIT.  KX MODIFIER AFTER 15 VISITS.    PT Start Time 1300    PT Stop Time 1359    PT Time Calculation (min) 59 min    Activity Tolerance Patient tolerated treatment well    Behavior During Therapy WFL for tasks assessed/performed             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   sees Dr Allyson Sabal   Cancer [facility])    melanoma   Dysuria    Frequency of urination    Hematuria    History of kidney stones    Meningioma ([place]) 09/04/2020   Osteoporosis    Renal calculus, bilateral    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   Urgency of urination    Wears glasses    Past Surgical History:  Procedure Laterality Date   APPLICATION OF CRANIAL NAVIGATION Right 09/11/2020   Procedure: APPLICATION OF CRANIAL NAVIGATION;  Surgeon: Vallarie Mare, MD;  Location: [place];  Service: Neurosurgery;  Laterality: Right;   CRANIOTOMY Right 09/11/2020   Procedure: Right Pterional Craniotomy for resection of anterior skull base mass with Brain Lab;  Surgeon: Vallarie Mare, MD;  Location: [place];  Service: Neurosurgery;  Laterality: Right;   CYSTOSCOPY WITH RETROGRADE PYELOGRAM, URETEROSCOPY AND STENT PLACEMENT Bilateral 08/11/2013   Procedure: CYSTOSCOPY WITH RETROGRADE PYELOGRAM, URETEROSCOPY AND STENT PLACEMENT;  Surgeon: Alexis Frock, MD;  [facility];  Service: Urology;  Laterality: Bilateral;   CYSTOSCOPY WITH URETEROSCOPY AND STENT PLACEMENT Bilateral 08/29/2013   Procedure: CYSTOSCOPY WITH BILATERAL URETEROSCOPY AND STENT REPLACEMENTS, stone  extraction;  Surgeon: Alexis Frock, MD;  Location: [facility];  Service: Urology;  Laterality: Bilateral;   CYSTOSCOPY/RETROGRADE/URETEROSCOPY/STONE EXTRACTION WITH BASKET Bilateral 01/31/2016   Procedure: CYSTOSCOPY/BILATERAL RETROGRADE PYELOGRAMS/BILATERAL URETEROSCOPY/BILATERAL STONE EXTRACTION WITH BASKET/RIGHT URETERAL STENT PLACEMENT;  Surgeon: Raynelle Bring, MD;  [facility];  Service: Urology;  Laterality: Bilateral;   HEMIARTHROPLASTY SHOULDER FRACTURE Left 1992   HOLMIUM LASER APPLICATION Bilateral 62/95/2841   Procedure: HOLMIUM LASER APPLICATION;  Surgeon: Alexis Frock, MD;  [facility];  Service: Urology;  Laterality: Bilateral;   Patient Active Problem List   Diagnosis Date Noted   History of resection of meningioma 11/02/2020   Peripheral edema    Hypoalbuminemia due to protein-calorie malnutrition (HCC)    Transaminitis    Tremor    Iron deficiency anemia    CKD (chronic kidney disease), stage III ([place]) 09/10/2020   Vasogenic Cerebral edema ([place]) 09/04/2020   Cancer of skin, squamous cell 09/04/2020   Nephrolithiasis 09/04/2020   Essential hypertension 11/01/2015   Osteoporosis 10/30/2015   REFERRING PROVIDER: Duffy Rhody MD  REFERRING DIAG: Cervical spondylosis without myelopathy.  THERAPY DIAG:  Cervicalgia  Abnormal posture  Rationale for Evaluation and Treatment Rehabilitation  ONSET DATE: "2-3 years ago".  SUBJECTIVE:  SUBJECTIVE STATEMENT: Patient reports that her neck is a little stiff and sore today.   PERTINENT HISTORY:  Cancer (resolved), OP, left TSA, OA.  PAIN:  Are you having pain? Yes: NPRS scale: 5/10 Pain location: Left cervical. Pain description: stiff, sore. Aggravating factors:  Movement. Relieving factors: Rest.   PRECAUTIONS: Other: OP.  WEIGHT BEARING RESTRICTIONS No  FALLS:  Has patient fallen in last 6 months? No  LIVING ENVIRONMENT: Lives with: lives with their family and lives alone Lives in: House/apartment Has following equipment at home: None  OCCUPATION: Retired.  PLOF: Independent  PATIENT GOALS Reduce neck pain.  OBJECTIVE: performed at her initial evaluation on 7/17  PATIENT SURVEYS:  FOTO Complete.   COGNITION: Overall cognitive status: Within functional limits for tasks assessed   POSTURE: rounded shoulders and forward head (3 inch forward head of rounded shoulders).  PALPATION: Tender and very taut to palpation over patient upper cervical paraspinal and UT musculature on the left.   CERVICAL ROM:   Active right cervical rotation is 25 degrees and left is 32 degrees.  UPPER EXTREMITY ROM:  Very limited left sholder AROM.  UPPER EXTREMITY MMT:  Right shoulder abduction and bilateral elbow strength is normal.  DTR's:  UE DTR's are normal.  TODAY'S TREATMENT:                                    7/20 EXERCISE LOG  Exercise Repetitions and Resistance Comments  UBE 120 RPM x 6 minutes   Chin tucks 30 reps    Left UT stretch 4 x 30 seconds (seated and supine)    Seated scapular depression  15 reps  Required tactile and verbal cueing   Self cervical distraction with towel 20 reps w/ 3 second hold        Blank cell = exercise not performed today  Manual Therapy Soft Tissue Mobilization: left upper trapezius, for  Manual Traction: cervical, moderate effectiveness at reducing her familiar pain   Modalities: no redness or adverse reactions to today's modalities  Unattended Estim: left upper trapezius, pre mod at 80-150 Hz , 15 mins, Pain Hot Pack: left upper trapezius, 15 mins, Pain             PATIENT EDUCATION:  Education details: Chin tucks and extension. HOME EXERCISE  PROGRAM: [id number]  ASSESSMENT:  CLINICAL IMPRESSION: Patient was introduced to multiple new interventions for improved cervical mobility and reduced pain. She required minimal cueing with seated scapular depression to facilitate periscapular engagement. She reported no significant pain or discomfort with any of today's interventions. She reported that her neck felt better upon the conclusion of treatment. She continues to require skilled physical therapy to address her remaining impairments to return to her prior level of function.  OBJECTIVE IMPAIRMENTS decreased ROM, increased muscle spasms, postural dysfunction, and pain.   ACTIVITY LIMITATIONS  ADL's.  PARTICIPATION LIMITATIONS: meal prep and yard work  PERSONAL FACTORS Time since onset of injury/illness/exacerbation are also affecting patient's functional outcome.   REHAB POTENTIAL: Good  CLINICAL DECISION MAKING: Stable/uncomplicated  EVALUATION COMPLEXITY: Low   GOALS: Goals reviewed with patient? Yes  SHORT TERM GOALS: Target date: 04/22/2022   Independent with HEP. Baseline:  Goal status: INITIAL      LONG TERM GOALS: Target date: 05/20/2022  Increase active cervical rotation to 55 degrees+ so patient can turn head more easily while driving.  Goal status: INITIAL  2.  Eliminate headaches. Baseline:  Goal status: INITIAL  3.  Perform ADL's with neck pain not > 3/10. Baseline:  Goal status: INITIAL     PLAN: PT FREQUENCY: 2x/week  PT DURATION: 6 weeks  PLANNED INTERVENTIONS: Therapeutic exercises, Therapeutic activity, Patient/Family education, Self Care, Dry Needling, Electrical stimulation, Cryotherapy, Moist heat, Ultrasound, and Manual therapy  PLAN FOR NEXT SESSION: Modalities and STW/M, postural exercises, chin tucks with extension, towel extensions.   Darlin Coco, PT 04/08/2022, 5:24 PM

## 2022-04-12 ENCOUNTER — Ambulatory Visit: Payer: Medicare Other | Admitting: Physical Therapy

## 2022-04-12 ENCOUNTER — Encounter: Payer: Self-pay | Admitting: Physical Therapy

## 2022-04-12 DIAGNOSIS — M542 Cervicalgia: Secondary | ICD-10-CM

## 2022-04-12 DIAGNOSIS — R293 Abnormal posture: Secondary | ICD-10-CM | POA: Diagnosis not present

## 2022-04-12 NOTE — Therapy (Signed)
OUTPATIENT PHYSICAL THERAPY CERVICAL TREATMENT   Patient Name: [name] Page MRN: [id number] DOB:08-Jul-1951, 71 y.o., female Today's Date: 04/12/2022   PT End of Session - 04/12/22 0948     Visit Number 3    Number of Visits 12    Date for PT Re-Evaluation 07/04/22    Authorization Type FOTO AT LEAST EVERY 5TH VISIT.  PROGRESS NOTE AT 10TH VISIT.  KX MODIFIER AFTER 15 VISITS.    PT Start Time [phone]    PT Stop Time 1035    PT Time Calculation (min) 47 min    Activity Tolerance Patient tolerated treatment well    Behavior During Therapy WFL for tasks assessed/performed             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   sees Dr Allyson Sabal   Cancer [facility])    melanoma   Dysuria    Frequency of urination    Hematuria    History of kidney stones    Meningioma ([place]) 09/04/2020   Osteoporosis    Renal calculus, bilateral    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   Urgency of urination    Wears glasses    Past Surgical History:  Procedure Laterality Date   APPLICATION OF CRANIAL NAVIGATION Right 09/11/2020   Procedure: APPLICATION OF CRANIAL NAVIGATION;  Surgeon: Vallarie Mare, MD;  Location: [place];  Service: Neurosurgery;  Laterality: Right;   CRANIOTOMY Right 09/11/2020   Procedure: Right Pterional Craniotomy for resection of anterior skull base mass with Brain Lab;  Surgeon: Vallarie Mare, MD;  Location: [place];  Service: Neurosurgery;  Laterality: Right;   CYSTOSCOPY WITH RETROGRADE PYELOGRAM, URETEROSCOPY AND STENT PLACEMENT Bilateral 08/11/2013   Procedure: CYSTOSCOPY WITH RETROGRADE PYELOGRAM, URETEROSCOPY AND STENT PLACEMENT;  Surgeon: Alexis Frock, MD;  [facility];  Service: Urology;  Laterality: Bilateral;   CYSTOSCOPY WITH URETEROSCOPY AND STENT PLACEMENT Bilateral 08/29/2013   Procedure: CYSTOSCOPY WITH BILATERAL URETEROSCOPY AND STENT REPLACEMENTS, stone  extraction;  Surgeon: Alexis Frock, MD;  Location[facility];  Service: Urology;  Laterality: Bilateral;   CYSTOSCOPY/RETROGRADE/URETEROSCOPY/STONE EXTRACTION WITH BASKET Bilateral 01/31/2016   Procedure: CYSTOSCOPY/BILATERAL RETROGRADE PYELOGRAMS/BILATERAL URETEROSCOPY/BILATERAL STONE EXTRACTION WITH BASKET/RIGHT URETERAL STENT PLACEMENT;  Surgeon: Raynelle Bring, MD;  [facility];  Service: Urology;  Laterality: Bilateral;   HEMIARTHROPLASTY SHOULDER FRACTURE Left 1992   HOLMIUM LASER APPLICATION Bilateral 17/00/1749   Procedure: HOLMIUM LASER APPLICATION;  Surgeon: Alexis Frock, MD;  Location: [facility];  Service: Urology;  Laterality: Bilateral;   Patient Active Problem List   Diagnosis Date Noted   History of resection of meningioma 11/02/2020   Peripheral edema    Hypoalbuminemia due to protein-calorie malnutrition (HCC)    Transaminitis    Tremor    Iron deficiency anemia    CKD (chronic kidney disease), stage III ([place]) 09/10/2020   Vasogenic Cerebral edema ([place]) 09/04/2020   Cancer of skin, squamous cell 09/04/2020   Nephrolithiasis 09/04/2020   Essential hypertension 11/01/2015   Osteoporosis 10/30/2015   REFERRING PROVIDER: Duffy Rhody MD  REFERRING DIAG: Cervical spondylosis without myelopathy.  THERAPY DIAG:  Cervicalgia  Abnormal posture  Rationale for Evaluation and Treatment Rehabilitation  ONSET DATE: "2-3 years ago".  SUBJECTIVE:  SUBJECTIVE STATEMENT: Patient reports that her neck is a little stiff and sore today. UBE made her more sore.  PERTINENT HISTORY:  Cancer (resolved), OP, left TSA, OA.  PAIN:  Are you having pain? Yes: NPRS scale: 5/10 Pain location: Left cervical. Pain description: stiff,  sore. Aggravating factors: Movement. Relieving factors: Rest.   PRECAUTIONS: Other: OP.  WEIGHT BEARING RESTRICTIONS No   OBJECTIVE: performed at her initial evaluation on 7/17  PATIENT SURVEYS:  FOTO Complete.   TODAY'S TREATMENT: Manual Therapy Soft Tissue Mobilization: L UT, to reduce tone and pain/soreness  Modalities  Date:  Unattended Estim: Cervical, Pre-Mod 80-150 hz, 15 mins, Pain and Tone  PATIENT EDUCATION:  Education details: Chin tucks and extension. HOME EXERCISE PROGRAM: [id number]  ASSESSMENT:  CLINICAL IMPRESSION: Patient presented in clinic with reports of more soreness of the L UT that was also excerbated by the UBE during last session. Increased tone palpable throughout L cervical and UT region. Patient observed sitting in chair with forward head and limited cervical rotation bilaterally. Normal stimulation response noted following removal of the modality.  OBJECTIVE IMPAIRMENTS decreased ROM, increased muscle spasms, postural dysfunction, and pain.   ACTIVITY LIMITATIONS  ADL's.  PARTICIPATION LIMITATIONS: meal prep and yard work  PERSONAL FACTORS Time since onset of injury/illness/exacerbation are also affecting patient's functional outcome.   REHAB POTENTIAL: Good  CLINICAL DECISION MAKING: Stable/uncomplicated   GOALS: Goals reviewed with patient? Yes  SHORT TERM GOALS: Target date: 04/22/2022   Independent with HEP. Baseline:  Goal status: INITIAL   LONG TERM GOALS: Target date: 05/20/2022  Increase active cervical rotation to 55 degrees+ so patient can turn head more easily while driving.  Goal status: INITIAL  2.  Eliminate headaches. Baseline:  Goal status: INITIAL  3.  Perform ADL's with neck pain not > 3/10. Baseline:  Goal status: INITIAL     PLAN: PT FREQUENCY: 2x/week  PT DURATION: 6 weeks  PLANNED INTERVENTIONS: Therapeutic exercises, Therapeutic activity, Patient/Family education, Self Care, Dry Needling,  Electrical stimulation, Cryotherapy, Moist heat, Ultrasound, and Manual therapy  PLAN FOR NEXT SESSION: Modalities and STW/M, postural exercises, chin tucks with extension, towel extensions.   Standley Brooking, PTA 04/12/2022, 12:14 PM

## 2022-04-15 ENCOUNTER — Ambulatory Visit: Payer: Medicare Other | Admitting: Physical Therapy

## 2022-04-15 ENCOUNTER — Encounter: Payer: Self-pay | Admitting: Physical Therapy

## 2022-04-15 DIAGNOSIS — M542 Cervicalgia: Secondary | ICD-10-CM

## 2022-04-15 DIAGNOSIS — R293 Abnormal posture: Secondary | ICD-10-CM

## 2022-04-15 NOTE — Therapy (Signed)
OUTPATIENT PHYSICAL THERAPY CERVICAL TREATMENT   Patient Name: [name] Page MRN: [id number] DOB:26-Aug-1951, 71 y.o., female Today's Date: 04/15/2022   PT End of Session - 04/15/22 1346     Visit Number 4    Number of Visits 12    Date for PT Re-Evaluation 07/04/22    Authorization Type FOTO AT LEAST EVERY 5TH VISIT.  PROGRESS NOTE AT 10TH VISIT.  KX MODIFIER AFTER 15 VISITS.    PT Start Time 1346    PT Stop Time 1428    PT Time Calculation (min) 42 min    Activity Tolerance Patient tolerated treatment well    Behavior During Therapy WFL for tasks assessed/performed             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   sees Dr Allyson Sabal   Cancer [facility])    melanoma   Dysuria    Frequency of urination    Hematuria    History of kidney stones    Meningioma ([place]) 09/04/2020   Osteoporosis    Renal calculus, bilateral    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   Urgency of urination    Wears glasses    Past Surgical History:  Procedure Laterality Date   APPLICATION OF CRANIAL NAVIGATION Right 09/11/2020   Procedure: APPLICATION OF CRANIAL NAVIGATION;  Surgeon: Vallarie Mare, MD;  Location: [place];  Service: Neurosurgery;  Laterality: Right;   CRANIOTOMY Right 09/11/2020   Procedure: Right Pterional Craniotomy for resection of anterior skull base mass with Brain Lab;  Surgeon: Vallarie Mare, MD;  Location: [place];  Service: Neurosurgery;  Laterality: Right;   CYSTOSCOPY WITH RETROGRADE PYELOGRAM, URETEROSCOPY AND STENT PLACEMENT Bilateral 08/11/2013   Procedure: CYSTOSCOPY WITH RETROGRADE PYELOGRAM, URETEROSCOPY AND STENT PLACEMENT;  Surgeon: Alexis Frock, MD;  [facility];  Service: Urology;  Laterality: Bilateral;   CYSTOSCOPY WITH URETEROSCOPY AND STENT PLACEMENT Bilateral 08/29/2013   Procedure: CYSTOSCOPY WITH BILATERAL URETEROSCOPY AND STENT REPLACEMENTS, stone  extraction;  Surgeon: Alexis Frock, MD;  Location: [facility];  Service: Urology;  Laterality: Bilateral;   CYSTOSCOPY/RETROGRADE/URETEROSCOPY/STONE EXTRACTION WITH BASKET Bilateral 01/31/2016   Procedure: CYSTOSCOPY/BILATERAL RETROGRADE PYELOGRAMS/BILATERAL URETEROSCOPY/BILATERAL STONE EXTRACTION WITH BASKET/RIGHT URETERAL STENT PLACEMENT;  Surgeon: Raynelle Bring, MD;  [facility];  Service: Urology;  Laterality: Bilateral;   HEMIARTHROPLASTY SHOULDER FRACTURE Left 1992   HOLMIUM LASER APPLICATION Bilateral 76/73/4193   Procedure: HOLMIUM LASER APPLICATION;  Surgeon: Alexis Frock, MD;  Location: [facility];  Service: Urology;  Laterality: Bilateral;   Patient Active Problem List   Diagnosis Date Noted   History of resection of meningioma 11/02/2020   Peripheral edema    Hypoalbuminemia due to protein-calorie malnutrition (HCC)    Transaminitis    Tremor    Iron deficiency anemia    CKD (chronic kidney disease), stage III ([place]) 09/10/2020   Vasogenic Cerebral edema ([place]) 09/04/2020   Cancer of skin, squamous cell 09/04/2020   Nephrolithiasis 09/04/2020   Essential hypertension 11/01/2015   Osteoporosis 10/30/2015   REFERRING PROVIDER: Duffy Rhody MD  REFERRING DIAG: Cervical spondylosis without myelopathy.  THERAPY DIAG:  Cervicalgia  Abnormal posture  Rationale for Evaluation and Treatment Rehabilitation  ONSET DATE: "2-3 years ago".  SUBJECTIVE:  SUBJECTIVE STATEMENT: Been better since last week. Went dancing this week. Just some soreness today.  PERTINENT HISTORY:  Cancer (resolved), OP, left TSA, OA.  PAIN:  Are you having pain? Yes: NPRS scale: 4/10 Pain location: Left cervical. Pain description: stiff, sore. Aggravating  factors: Movement. Relieving factors: Rest.   PRECAUTIONS: Other: OP.  WEIGHT BEARING RESTRICTIONS No   OBJECTIVE:  PATIENT SURVEYS:  FOTO Complete.   TODAY'S TREATMENT: Manual Therapy Soft Tissue Mobilization: L UT and cervical paraspinals, to reduce tone and pain/soreness  Modalities  Date: 04/15/2022 Unattended Estim: Cervical, Pre-Mod 80-150 hz, 15 mins, Pain and Tone  PATIENT EDUCATION:  Education details: Chin tucks and extension. HOME EXERCISE PROGRAM: [id number]  ASSESSMENT:  CLINICAL IMPRESSION: Patient presented in clinic with increased soreness in the L UT region. Patient presented with increased muscle tone of the L UT especially. Forward head and forward shoulders noted in sitting posture. Patient experiencing better pain control since starting manual therapy per patient report. Normal modalities response noted following removal of the modalities.  OBJECTIVE IMPAIRMENTS decreased ROM, increased muscle spasms, postural dysfunction, and pain.   ACTIVITY LIMITATIONS  ADL's.  PARTICIPATION LIMITATIONS: meal prep and yard work  PERSONAL FACTORS Time since onset of injury/illness/exacerbation are also affecting patient's functional outcome.   REHAB POTENTIAL: Good  CLINICAL DECISION MAKING: Stable/uncomplicated   GOALS: Goals reviewed with patient? Yes  SHORT TERM GOALS: Target date: 04/22/2022   Independent with HEP. Baseline:  Goal status: INITIAL   LONG TERM GOALS: Target date: 05/20/2022  Increase active cervical rotation to 55 degrees+ so patient can turn head more easily while driving.  Goal status: INITIAL  2.  Eliminate headaches. Baseline:  Goal status: INITIAL  3.  Perform ADL's with neck pain not > 3/10. Baseline:  Goal status: INITIAL     PLAN: PT FREQUENCY: 2x/week  PT DURATION: 6 weeks  PLANNED INTERVENTIONS: Therapeutic exercises, Therapeutic activity, Patient/Family education, Self Care, Dry Needling, Electrical  stimulation, Cryotherapy, Moist heat, Ultrasound, and Manual therapy  PLAN FOR NEXT SESSION: Modalities and STW/M, postural exercises, chin tucks with extension, towel extensions.   Standley Brooking, PTA 04/15/2022, 2:32 PM

## 2022-04-20 ENCOUNTER — Ambulatory Visit: Payer: Medicare Other | Attending: Neurosurgery

## 2022-04-20 DIAGNOSIS — R293 Abnormal posture: Secondary | ICD-10-CM | POA: Insufficient documentation

## 2022-04-20 DIAGNOSIS — M542 Cervicalgia: Secondary | ICD-10-CM | POA: Insufficient documentation

## 2022-04-20 NOTE — Therapy (Signed)
OUTPATIENT PHYSICAL THERAPY CERVICAL TREATMENT   Patient Name: [name] Page MRN: [id number] DOB:January 13, 1951, 71 y.o., female Today's Date: 04/20/2022   PT End of Session - 04/20/22 1042     Visit Number 5    Number of Visits 12    Date for PT Re-Evaluation 07/04/22    Authorization Type FOTO AT LEAST EVERY 5TH VISIT.  PROGRESS NOTE AT 10TH VISIT.  KX MODIFIER AFTER 15 VISITS.    PT Start Time 1030    PT Stop Time 1131    PT Time Calculation (min) 61 min    Activity Tolerance Patient tolerated treatment well    Behavior During Therapy WFL for tasks assessed/performed             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   sees Dr Allyson Sabal   Cancer [facility])    melanoma   Dysuria    Frequency of urination    Hematuria    History of kidney stones    Meningioma ([place]) 09/04/2020   Osteoporosis    Renal calculus, bilateral    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   Urgency of urination    Wears glasses    Past Surgical History:  Procedure Laterality Date   APPLICATION OF CRANIAL NAVIGATION Right 09/11/2020   Procedure: APPLICATION OF CRANIAL NAVIGATION;  Surgeon: Vallarie Mare, MD;  Location: [place];  Service: Neurosurgery;  Laterality: Right;   CRANIOTOMY Right 09/11/2020   Procedure: Right Pterional Craniotomy for resection of anterior skull base mass with Brain Lab;  Surgeon: Vallarie Mare, MD;  Location: [place];  Service: Neurosurgery;  Laterality: Right;   CYSTOSCOPY WITH RETROGRADE PYELOGRAM, URETEROSCOPY AND STENT PLACEMENT Bilateral 08/11/2013   Procedure: CYSTOSCOPY WITH RETROGRADE PYELOGRAM, URETEROSCOPY AND STENT PLACEMENT;  Surgeon: Alexis Frock, MD;  [facility];  Service: Urology;  Laterality: Bilateral;   CYSTOSCOPY WITH URETEROSCOPY AND STENT PLACEMENT Bilateral 08/29/2013   Procedure: CYSTOSCOPY WITH BILATERAL URETEROSCOPY AND STENT REPLACEMENTS, stone  extraction;  Surgeon: Alexis Frock, MD;  Location[facility];  Service: Urology;  Laterality: Bilateral;   CYSTOSCOPY/RETROGRADE/URETEROSCOPY/STONE EXTRACTION WITH BASKET Bilateral 01/31/2016   Procedure: CYSTOSCOPY/BILATERAL RETROGRADE PYELOGRAMS/BILATERAL URETEROSCOPY/BILATERAL STONE EXTRACTION WITH BASKET/RIGHT URETERAL STENT PLACEMENT;  Surgeon: Raynelle Bring, MD;  [facility];  Service: Urology;  Laterality: Bilateral;   HEMIARTHROPLASTY SHOULDER FRACTURE Left 1992   HOLMIUM LASER APPLICATION Bilateral 09/38/1829   Procedure: HOLMIUM LASER APPLICATION;  Surgeon: Alexis Frock, MD;  Location: [facility];  Service: Urology;  Laterality: Bilateral;   Patient Active Problem List   Diagnosis Date Noted   History of resection of meningioma 11/02/2020   Peripheral edema    Hypoalbuminemia due to protein-calorie malnutrition (HCC)    Transaminitis    Tremor    Iron deficiency anemia    CKD (chronic kidney disease), stage III ([place]) 09/10/2020   Vasogenic Cerebral edema ([place]) 09/04/2020   Cancer of skin, squamous cell 09/04/2020   Nephrolithiasis 09/04/2020   Essential hypertension 11/01/2015   Osteoporosis 10/30/2015   REFERRING PROVIDER: Duffy Rhody MD  REFERRING DIAG: Cervical spondylosis without myelopathy.  THERAPY DIAG:  Cervicalgia  Abnormal posture  Rationale for Evaluation and Treatment Rehabilitation  ONSET DATE: "2-3 years ago".  SUBJECTIVE:  SUBJECTIVE STATEMENT: Pt reports slight low back pain today, but not enough to give it a number.  Plans on going dancing tonight.   PERTINENT HISTORY:  Cancer (resolved), OP, left TSA, OA.  PAIN:  Are you having pain? Yes: NPRS scale: no number given/10 Pain location: low  back  PRECAUTIONS: Other: OP.  WEIGHT BEARING RESTRICTIONS No   OBJECTIVE:  PATIENT SURVEYS:  FOTO Complete.   TODAY'S TREATMENT:                                    EXERCISE LOG  Exercise Repetitions and Resistance Comments  Nustep Lvl 3 - 4 x  20 mins   Chin tucks 20 reps   Lateral flexion Bil, 20 reps   Rows Bil, yellow x 20 reps   Extension Bil, yellow x 20 reps    Blank cell = exercise not performed today   Manual Therapy Soft Tissue Mobilization: L UT and cervical paraspinals, to reduce tone and pain/soreness  Modalities  Date: 04/20/2022 Unattended Estim: Cervical, Pre-Mod 80-150 hz, 15 mins, Pain and Tone  PATIENT EDUCATION:  Education details: Chin tucks and extension. HOME EXERCISE PROGRAM: [id number]  ASSESSMENT:  CLINICAL IMPRESSION: Pt arrives for today's treatment session slight low back pain, but did not give it a number.  Pt able to tolerate Nustep today for warm-up without issue.  Pt instructed in seated neck exercises with min cues required for proper technique.  Pt also able to tolerate introduction to seated resisted rows and extension with yellow tband. These exercises added to pt's HEP.  STW/M performed to left UT and cervical paraspinals to decrease pain and tone.  Normal responses to estim and MH noted upon removal.  Pt denied any pain at completion of today's treatment session.  OBJECTIVE IMPAIRMENTS decreased ROM, increased muscle spasms, postural dysfunction, and pain.   ACTIVITY LIMITATIONS  ADL's.  PARTICIPATION LIMITATIONS: meal prep and yard work  PERSONAL FACTORS Time since onset of injury/illness/exacerbation are also affecting patient's functional outcome.   REHAB POTENTIAL: Good  CLINICAL DECISION MAKING: Stable/uncomplicated   GOALS: Goals reviewed with patient? Yes  SHORT TERM GOALS: Target date: 04/22/2022   Independent with HEP. Baseline:  Goal status: INITIAL   LONG TERM GOALS: Target date: 05/20/2022  Increase active  cervical rotation to 55 degrees+ so patient can turn head more easily while driving.  Goal status: INITIAL  2.  Eliminate headaches. Baseline:  Goal status: INITIAL  3.  Perform ADL's with neck pain not > 3/10. Baseline:  Goal status: INITIAL     PLAN: PT FREQUENCY: 2x/week  PT DURATION: 6 weeks  PLANNED INTERVENTIONS: Therapeutic exercises, Therapeutic activity, Patient/Family education, Self Care, Dry Needling, Electrical stimulation, Cryotherapy, Moist heat, Ultrasound, and Manual therapy  PLAN FOR NEXT SESSION: Modalities and STW/M, postural exercises, chin tucks with extension, towel extensions.   Kathrynn Ducking, PTA 04/20/2022, 11:33 AM

## 2022-04-22 ENCOUNTER — Ambulatory Visit: Payer: Medicare Other | Admitting: Physical Therapy

## 2022-04-22 ENCOUNTER — Encounter: Payer: Self-pay | Admitting: Physical Therapy

## 2022-04-22 DIAGNOSIS — M542 Cervicalgia: Secondary | ICD-10-CM | POA: Diagnosis not present

## 2022-04-22 DIAGNOSIS — R293 Abnormal posture: Secondary | ICD-10-CM | POA: Diagnosis not present

## 2022-04-22 NOTE — Therapy (Signed)
OUTPATIENT PHYSICAL THERAPY CERVICAL TREATMENT   Patient Name: [name] Page MRN: [id number] DOB:1951/08/21, 71 y.o., female Today's Date: 04/22/2022   PT End of Session - 04/22/22 1108     Visit Number 6    Number of Visits 12    Date for PT Re-Evaluation 07/04/22    Authorization Type FOTO AT LEAST EVERY 5TH VISIT.  PROGRESS NOTE AT 10TH VISIT.  KX MODIFIER AFTER 15 VISITS.    PT Start Time [phone]    PT Stop Time 1039    PT Time Calculation (min) 51 min    Activity Tolerance Patient tolerated treatment well    Behavior During Therapy WFL for tasks assessed/performed             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   sees Dr Allyson Sabal   Cancer [facility])    melanoma   Dysuria    Frequency of urination    Hematuria    History of kidney stones    Meningioma ([place]) 09/04/2020   Osteoporosis    Renal calculus, bilateral    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   Urgency of urination    Wears glasses    Past Surgical History:  Procedure Laterality Date   APPLICATION OF CRANIAL NAVIGATION Right 09/11/2020   Procedure: APPLICATION OF CRANIAL NAVIGATION;  Surgeon: Vallarie Mare, MD;  Location: [place];  Service: Neurosurgery;  Laterality: Right;   CRANIOTOMY Right 09/11/2020   Procedure: Right Pterional Craniotomy for resection of anterior skull base mass with Brain Lab;  Surgeon: Vallarie Mare, MD;  Location: [place];  Service: Neurosurgery;  Laterality: Right;   CYSTOSCOPY WITH RETROGRADE PYELOGRAM, URETEROSCOPY AND STENT PLACEMENT Bilateral 08/11/2013   Procedure: CYSTOSCOPY WITH RETROGRADE PYELOGRAM, URETEROSCOPY AND STENT PLACEMENT;  Surgeon: Alexis Frock, MD;  [facility];  Service: Urology;  Laterality: Bilateral;   CYSTOSCOPY WITH URETEROSCOPY AND STENT PLACEMENT Bilateral 08/29/2013   Procedure: CYSTOSCOPY WITH BILATERAL URETEROSCOPY AND STENT REPLACEMENTS, stone  extraction;  Surgeon: Alexis Frock, MD;  [facility];  Service: Urology;  Laterality: Bilateral;   CYSTOSCOPY/RETROGRADE/URETEROSCOPY/STONE EXTRACTION WITH BASKET Bilateral 01/31/2016   Procedure: CYSTOSCOPY/BILATERAL RETROGRADE PYELOGRAMS/BILATERAL URETEROSCOPY/BILATERAL STONE EXTRACTION WITH BASKET/RIGHT URETERAL STENT PLACEMENT;  Surgeon: Raynelle Bring, MD;  [facility];  Service: Urology;  Laterality: Bilateral;   HEMIARTHROPLASTY SHOULDER FRACTURE Left 1992   HOLMIUM LASER APPLICATION Bilateral 12/45/8099   Procedure: HOLMIUM LASER APPLICATION;  Surgeon: Alexis Frock, MD;  Location: [facility];  Service: Urology;  Laterality: Bilateral;   Patient Active Problem List   Diagnosis Date Noted   History of resection of meningioma 11/02/2020   Peripheral edema    Hypoalbuminemia due to protein-calorie malnutrition (HCC)    Transaminitis    Tremor    Iron deficiency anemia    CKD (chronic kidney disease), stage III ([place]) 09/10/2020   Vasogenic Cerebral edema ([place]) 09/04/2020   Cancer of skin, squamous cell 09/04/2020   Nephrolithiasis 09/04/2020   Essential hypertension 11/01/2015   Osteoporosis 10/30/2015   REFERRING PROVIDER: Duffy Rhody MD  REFERRING DIAG: Cervical spondylosis without myelopathy.  THERAPY DIAG:  Cervicalgia  Abnormal posture  Rationale for Evaluation and Treatment Rehabilitation  ONSET DATE: "2-3 years ago".  SUBJECTIVE:  SUBJECTIVE STATEMENT: No new complaints.  PERTINENT HISTORY:  Cancer (resolved), OP, left TSA, OA.  PAIN:  Are you having pain? Yes: NPRS scale: no number given/10 Pain location: low back  PRECAUTIONS: Other: OP.  WEIGHT BEARING RESTRICTIONS No   OBJECTIVE:  PATIENT SURVEYS:   FOTO Complete.   TODAY'S TREATMENT:                                    EXERCISE LOG 04/22/22  Exercise Repetitions and Resistance Comments  UBE 120 RPM's x 6 minutes.                    Blank cell = exercise not performed today   Manual Therapy Soft Tissue Mobilization: L UT and cervical paraspinals, to reduce tone and pain/soreness  x 9 minutes with ischemic release technique utilized.  Modalities  Combo e'atim/US at 1.50 W/CM2 x 8 minutes to patient's left UT.  IFC at 80-150 Hz on 40% scan x 20 minutes.   PATIENT EDUCATION:  [facility]   DRY NEEDLING CONSENT FORM   Trigger point dry needling is a physical therapy approach to treat Myofascial Pain and Dysfunction.  Dry Needling (DN) is a valuable and effective way to deactivate myofascial trigger points (muscle knots/pain). It is skilled intervention that uses a thin filiform needle to penetrate the skin and stimulate underlying myofascial trigger points, muscular, and connective tissues for the management of neuromusculoskeletal pain and movement impairments.  A local twitch response (LTR) will be elicited.  This can sometimes feel like a deep ache in the muscle during the procedure. Multiple trigger points in multiple muscles can be treated during each treatment.  No medication of any kind is injected.   As with any medical treatment and procedure, there are possible adverse events.  While significant adverse events are uncommon, they do sometimes occur and must be considered prior to giving consent.  Dry needling often causes a "post needling soreness".  There can be an increase in pain from a couple of hours to 2-3 days, followed by an improvement in the overall pain state. Any time a needle is used there is a risk of infection.  However, we are using new, sterile, and disposable needles; infections are extremely rare. There is a possibility that you may bleed or bruise.  You may feel tired  and some nausea following treatment. There is a rare possibility of a pneumothorax (air in the chest cavity). Allergic reaction to nickel in the stainless steel needle. If a nerve is touched, it may cause paresthesia (a prickling/shock sensation) which is usually brief, but may continue for a couple of days.  Following treatment stay hydrated.  Continue regular activities but not too vigorous initially after treatment for 24-48 hours.  Dry Needling is best when combined with other physical therapy interventions such as strengthening, stretching and other therapeutic modalities.   PLEASE ANSWER THE FOLLOWING QUESTIONS:  Do you have a lack of sensation?   Y/N  Do you have a phobia or fear of needles  Y/N  Are you pregnant?    Y/N If yes:  How many weeks? __________ Do you have any implanted devices?  Y/N If yes:  Pacemaker/Spinal Cord Stimulator/Deep Brain Stimulator/Insulin Pump/Other: ________________ Do you have any implants?  Y/N If yes: Breast/Facial/Pecs/Buttocks/Calves/Hip  Replacement/ Knee Replacement/Other: _________ Do you take any blood thinners?   Y/N If yes: Coumadin (  Warfarin)/Other: ___________________ Do you have a bleeding disorder?   Y/N If yes: What kind: _________________________________ Do you take any immunosuppressants?  Y/N If yes:   What kind: _________________________________ Do you take anti-inflammatories?   Y/N If yes: What kind: Advil/Aspirin/Other: ________________ Have you ever been diagnosed with Scoliosis? Y/N Have you had back surgery?   Y/N If yes:  Laminectomy/Fusion/Other: ___________________   I have read, or had read to me, the above.  I have had the opportunity to ask any questions.  All of my questions have been answered to my satisfaction and I understand the risks involved with dry needling.  I consent to examination and treatment at [facility], including dry needling, of any and all of my involved and affected  muscles.     Signature: __________________________________     Date:___________________________________________________      ASSESSMENT:  CLINICAL IMPRESSION: The patient with increased tone over left UT and levator scap muscles.  She did very well with STW/M including ischemic release technique and felt better after treatment.  We discussed dry needling and a consent form was provided to the patient.     GOALS: Goals reviewed with patient? Yes  SHORT TERM GOALS: Target date: 04/22/2022   Independent with HEP. Baseline:  Goal status: INITIAL   LONG TERM GOALS: Target date: 05/20/2022  Increase active cervical rotation to 55 degrees+ so patient can turn head more easily while driving.  Goal status: INITIAL  2.  Eliminate headaches. Baseline:  Goal status: INITIAL  3.  Perform ADL's with neck pain not > 3/10. Baseline:  Goal status: INITIAL     PLAN: PT FREQUENCY: 2x/week  PT DURATION: 6 weeks  PLANNED INTERVENTIONS: Therapeutic exercises, Therapeutic activity, Patient/Family education, Self Care, Dry Needling, Electrical stimulation, Cryotherapy, Moist heat, Ultrasound, and Manual therapy  PLAN FOR NEXT SESSION: Modalities and STW/M, postural exercises, chin tucks with extension, towel extensions.   Palma Buster, [place], PT 04/22/2022, 11:15 AM

## 2022-04-26 DIAGNOSIS — C44722 Squamous cell carcinoma of skin of right lower limb, including hip: Secondary | ICD-10-CM | POA: Diagnosis not present

## 2022-04-27 ENCOUNTER — Ambulatory Visit: Payer: Medicare Other | Admitting: Physical Therapy

## 2022-04-27 ENCOUNTER — Encounter: Payer: Self-pay | Admitting: Physical Therapy

## 2022-04-27 DIAGNOSIS — R293 Abnormal posture: Secondary | ICD-10-CM | POA: Diagnosis not present

## 2022-04-27 DIAGNOSIS — M542 Cervicalgia: Secondary | ICD-10-CM | POA: Diagnosis not present

## 2022-04-27 NOTE — Therapy (Signed)
OUTPATIENT PHYSICAL THERAPY CERVICAL TREATMENT   Patient Name: [name] Page MRN: [id number] DOB:November 13, 1950, 71 y.o., female Today's Date: 04/27/2022   PT End of Session - 04/27/22 1120     Visit Number 7    Number of Visits 12    Date for PT Re-Evaluation 07/04/22    Authorization Type FOTO AT LEAST EVERY 5TH VISIT.  PROGRESS NOTE AT 10TH VISIT.  KX MODIFIER AFTER 15 VISITS.    PT Start Time 1120    PT Stop Time 1202    PT Time Calculation (min) 42 min    Activity Tolerance Patient tolerated treatment well    Behavior During Therapy WFL for tasks assessed/performed             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   sees Dr Allyson Sabal   Cancer [facility])    melanoma   Dysuria    Frequency of urination    Hematuria    History of kidney stones    Meningioma ([place]) 09/04/2020   Osteoporosis    Renal calculus, bilateral    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   Urgency of urination    Wears glasses    Past Surgical History:  Procedure Laterality Date   APPLICATION OF CRANIAL NAVIGATION Right 09/11/2020   Procedure: APPLICATION OF CRANIAL NAVIGATION;  Surgeon: Vallarie Mare, MD;  Location: [place];  Service: Neurosurgery;  Laterality: Right;   CRANIOTOMY Right 09/11/2020   Procedure: Right Pterional Craniotomy for resection of anterior skull base mass with Brain Lab;  Surgeon: Vallarie Mare, MD;  Location: [place];  Service: Neurosurgery;  Laterality: Right;   CYSTOSCOPY WITH RETROGRADE PYELOGRAM, URETEROSCOPY AND STENT PLACEMENT Bilateral 08/11/2013   Procedure: CYSTOSCOPY WITH RETROGRADE PYELOGRAM, URETEROSCOPY AND STENT PLACEMENT;  Surgeon: Alexis Frock, MD;  [facility];  Service: Urology;  Laterality: Bilateral;   CYSTOSCOPY WITH URETEROSCOPY AND STENT PLACEMENT Bilateral 08/29/2013   Procedure: CYSTOSCOPY WITH BILATERAL URETEROSCOPY AND STENT REPLACEMENTS, stone  extraction;  Surgeon: Alexis Frock, MD;  Location: [facility];  Service: Urology;  Laterality: Bilateral;   CYSTOSCOPY/RETROGRADE/URETEROSCOPY/STONE EXTRACTION WITH BASKET Bilateral 01/31/2016   Procedure: CYSTOSCOPY/BILATERAL RETROGRADE PYELOGRAMS/BILATERAL URETEROSCOPY/BILATERAL STONE EXTRACTION WITH BASKET/RIGHT URETERAL STENT PLACEMENT;  Surgeon: Raynelle Bring, MD;  [facility];  Service: Urology;  Laterality: Bilateral;   HEMIARTHROPLASTY SHOULDER FRACTURE Left 1992   HOLMIUM LASER APPLICATION Bilateral 25/42/7062   Procedure: HOLMIUM LASER APPLICATION;  Surgeon: Alexis Frock, MD;  Location: [facility];  Service: Urology;  Laterality: Bilateral;   Patient Active Problem List   Diagnosis Date Noted   History of resection of meningioma 11/02/2020   Peripheral edema    Hypoalbuminemia due to protein-calorie malnutrition (HCC)    Transaminitis    Tremor    Iron deficiency anemia    CKD (chronic kidney disease), stage III ([place]) 09/10/2020   Vasogenic Cerebral edema ([place]) 09/04/2020   Cancer of skin, squamous cell 09/04/2020   Nephrolithiasis 09/04/2020   Essential hypertension 11/01/2015   Osteoporosis 10/30/2015   REFERRING PROVIDER: Duffy Rhody MD  REFERRING DIAG: Cervical spondylosis without myelopathy.  THERAPY DIAG:  Cervicalgia  Abnormal posture  Rationale for Evaluation and Treatment Rehabilitation  ONSET DATE: "2-3 years ago".  SUBJECTIVE:  SUBJECTIVE STATEMENT: Had a great weekend and no pain but continued tightness. Some improved cervical rotation.  PERTINENT HISTORY:  Cancer (resolved), OP, left TSA, OA.  PAIN:  Are you having pain? Yes: NPRS scale: 0/10 Pain location: low back  PRECAUTIONS: Other: OP.  WEIGHT BEARING  RESTRICTIONS No   OBJECTIVE:  PATIENT SURVEYS:  FOTO Complete.   TODAY'S TREATMENT:  Manual Therapy Soft Tissue Mobilization: L UT and cervical paraspinals, to reduce tone and pain/soreness    Modalities  Date:  Unattended Estim: Cervical, Pre-Mod 80-150 hz, 15 mins, Tone Combo: Cervical, sym. biphasic, 10 mins, Tone    ASSESSMENT:  CLINICAL IMPRESSION: Patient presented in clinic with reports of no pain and some tightness but has noticed an improved cervical rotation. Continued palpable tone in L UT and cervical paraspinals. Patient did receive DN handout but forgot to bring in paper. Normal modalities response noted following removal of the modalities. No complaints following end of treatment.   GOALS: Goals reviewed with patient? Yes  SHORT TERM GOALS: Target date: 04/22/2022   Independent with HEP. Baseline:  Goal status: INITIAL   LONG TERM GOALS: Target date: 05/20/2022  Increase active cervical rotation to 55 degrees+ so patient can turn head more easily while driving.  Goal status: INITIAL  2.  Eliminate headaches. Baseline:  Goal status: INITIAL  3.  Perform ADL's with neck pain not > 3/10. Baseline:  Goal status: INITIAL     PLAN: PT FREQUENCY: 2x/week  PT DURATION: 6 weeks  PLANNED INTERVENTIONS: Therapeutic exercises, Therapeutic activity, Patient/Family education, Self Care, Dry Needling, Electrical stimulation, Cryotherapy, Moist heat, Ultrasound, and Manual therapy  PLAN FOR NEXT SESSION: Modalities and STW/M, postural exercises, chin tucks with extension, towel extensions.   Standley Brooking, PTA 04/27/2022, 12:06 PM

## 2022-04-29 ENCOUNTER — Encounter: Payer: Self-pay | Admitting: Physical Therapy

## 2022-04-29 ENCOUNTER — Ambulatory Visit: Payer: Medicare Other | Admitting: Physical Therapy

## 2022-04-29 DIAGNOSIS — R293 Abnormal posture: Secondary | ICD-10-CM | POA: Diagnosis not present

## 2022-04-29 DIAGNOSIS — M542 Cervicalgia: Secondary | ICD-10-CM | POA: Diagnosis not present

## 2022-04-29 NOTE — Therapy (Addendum)
OUTPATIENT PHYSICAL THERAPY CERVICAL TREATMENT   Patient Name: [name] Page MRN: [id number] DOB:08-Mar-1951, 71 y.o., female Today's Date: 04/29/2022   PT End of Session - 04/29/22 1111     Visit Number 8    Number of Visits 12    Date for PT Re-Evaluation 07/04/22    Authorization Type FOTO AT LEAST EVERY 5TH VISIT.  PROGRESS NOTE AT 10TH VISIT.  KX MODIFIER AFTER 15 VISITS.    PT Start Time 1118    PT Stop Time 1158    PT Time Calculation (min) 40 min    Activity Tolerance Patient tolerated treatment well    Behavior During Therapy WFL for tasks assessed/performed             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   sees Dr Allyson Sabal   Cancer [facility])    melanoma   Dysuria    Frequency of urination    Hematuria    History of kidney stones    Meningioma ([place]) 09/04/2020   Osteoporosis    Renal calculus, bilateral    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   Urgency of urination    Wears glasses    Past Surgical History:  Procedure Laterality Date   APPLICATION OF CRANIAL NAVIGATION Right 09/11/2020   Procedure: APPLICATION OF CRANIAL NAVIGATION;  Surgeon: Vallarie Mare, MD;  Location: [place];  Service: Neurosurgery;  Laterality: Right;   CRANIOTOMY Right 09/11/2020   Procedure: Right Pterional Craniotomy for resection of anterior skull base mass with Brain Lab;  Surgeon: Vallarie Mare, MD;  Location: [place];  Service: Neurosurgery;  Laterality: Right;   CYSTOSCOPY WITH RETROGRADE PYELOGRAM, URETEROSCOPY AND STENT PLACEMENT Bilateral 08/11/2013   Procedure: CYSTOSCOPY WITH RETROGRADE PYELOGRAM, URETEROSCOPY AND STENT PLACEMENT;  Surgeon: Alexis Frock, MD;  [facility];  Service: Urology;  Laterality: Bilateral;   CYSTOSCOPY WITH URETEROSCOPY AND STENT PLACEMENT Bilateral 08/29/2013   Procedure: CYSTOSCOPY WITH BILATERAL URETEROSCOPY AND STENT REPLACEMENTS, stone  extraction;  Surgeon: Alexis Frock, MD;  Location: [facility];  Service: Urology;  Laterality: Bilateral;   CYSTOSCOPY/RETROGRADE/URETEROSCOPY/STONE EXTRACTION WITH BASKET Bilateral 01/31/2016   Procedure: CYSTOSCOPY/BILATERAL RETROGRADE PYELOGRAMS/BILATERAL URETEROSCOPY/BILATERAL STONE EXTRACTION WITH BASKET/RIGHT URETERAL STENT PLACEMENT;  Surgeon: Raynelle Bring, MD;  [facility];  Service: Urology;  Laterality: Bilateral;   HEMIARTHROPLASTY SHOULDER FRACTURE Left 1992   HOLMIUM LASER APPLICATION Bilateral 38/46/6599   Procedure: HOLMIUM LASER APPLICATION;  Surgeon: Alexis Frock, MD;  Location: [facility];  Service: Urology;  Laterality: Bilateral;   Patient Active Problem List   Diagnosis Date Noted   History of resection of meningioma 11/02/2020   Peripheral edema    Hypoalbuminemia due to protein-calorie malnutrition (HCC)    Transaminitis    Tremor    Iron deficiency anemia    CKD (chronic kidney disease), stage III ([place]) 09/10/2020   Vasogenic Cerebral edema ([place]) 09/04/2020   Cancer of skin, squamous cell 09/04/2020   Nephrolithiasis 09/04/2020   Essential hypertension 11/01/2015   Osteoporosis 10/30/2015   REFERRING PROVIDER: Duffy Rhody MD  REFERRING DIAG: Cervical spondylosis without myelopathy.  THERAPY DIAG:  Cervicalgia  Abnormal posture  Rationale for Evaluation and Treatment Rehabilitation  ONSET DATE: "2-3 years ago".  SUBJECTIVE:  SUBJECTIVE STATEMENT: Reports more ache from weather she thinks.  PERTINENT HISTORY:  Cancer (resolved), OP, left TSA, OA. PAIN:  Are you having pain? No   PRECAUTIONS: Other: OP.  WEIGHT BEARING RESTRICTIONS No   OBJECTIVE:  PATIENT SURVEYS:  FOTO Complete.   TODAY'S  TREATMENT:  Manual Therapy Soft Tissue Mobilization: L UT and cervical paraspinals, to reduce tone and pain/soreness    Modalities  Date: 04/29/2022 Unattended Estim: Cervical, Pre-Mod 80-150 hz, 15 mins, Tone Combo: Cervical, sym. biphasic, 10 mins, Tone    ASSESSMENT:  CLINICAL IMPRESSION: Patient continues to report relief overall but having more issues due to weather. Patient continues to have mod tone of the L UT and slight limitation of cervical ROM. Normal modalities response noted following removal of the modalities.   GOALS: Goals reviewed with patient? Yes  SHORT TERM GOALS: Target date: 04/22/2022   Independent with HEP. Baseline:  Goal status: On-going   LONG TERM GOALS: Target date: 05/20/2022  Increase active cervical rotation to 55 degrees+ so patient can turn head more easily while driving.  Goal status: On-going  2.  Eliminate headaches. Baseline:  Goal status: On-going  3.  Perform ADL's with neck pain not > 3/10. Baseline:  Goal status: On-going     PLAN: PT FREQUENCY: 2x/week  PT DURATION: 6 weeks  PLANNED INTERVENTIONS: Therapeutic exercises, Therapeutic activity, Patient/Family education, Self Care, Dry Needling, Electrical stimulation, Cryotherapy, Moist heat, Ultrasound, and Manual therapy  PLAN FOR NEXT SESSION: Modalities and STW/M, postural exercises, chin tucks with extension, towel extensions.   Standley Brooking, PTA 04/29/2022, 12:11 PM

## 2022-05-03 ENCOUNTER — Encounter: Payer: Self-pay | Admitting: Physical Therapy

## 2022-05-03 ENCOUNTER — Ambulatory Visit: Payer: Medicare Other | Admitting: Physical Therapy

## 2022-05-03 DIAGNOSIS — R293 Abnormal posture: Secondary | ICD-10-CM | POA: Diagnosis not present

## 2022-05-03 DIAGNOSIS — M542 Cervicalgia: Secondary | ICD-10-CM

## 2022-05-03 NOTE — Therapy (Addendum)
OUTPATIENT PHYSICAL THERAPY CERVICAL TREATMENT   Patient Name: [name] Page MRN: [id number] DOB:Aug 29, 1951, 71 y.o., female Today's Date: 05/03/2022   PT End of Session - 05/03/22 1349     Visit Number 9    Number of Visits 12    Date for PT Re-Evaluation 07/04/22    Authorization Type FOTO AT LEAST EVERY 5TH VISIT.  PROGRESS NOTE AT 10TH VISIT.  KX MODIFIER AFTER 15 VISITS.    PT Start Time 0100    PT Stop Time 0156    PT Time Calculation (min) 56 min    Activity Tolerance Patient tolerated treatment well    Behavior During Therapy WFL for tasks assessed/performed             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   sees Dr Allyson Sabal   Cancer [facility])    melanoma   Dysuria    Frequency of urination    Hematuria    History of kidney stones    Meningioma ([place]) 09/04/2020   Osteoporosis    Renal calculus, bilateral    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   Urgency of urination    Wears glasses    Past Surgical History:  Procedure Laterality Date   APPLICATION OF CRANIAL NAVIGATION Right 09/11/2020   Procedure: APPLICATION OF CRANIAL NAVIGATION;  Surgeon: Vallarie Mare, MD;  Location: [place];  Service: Neurosurgery;  Laterality: Right;   CRANIOTOMY Right 09/11/2020   Procedure: Right Pterional Craniotomy for resection of anterior skull base mass with Brain Lab;  Surgeon: Vallarie Mare, MD;  Location: [place];  Service: Neurosurgery;  Laterality: Right;   CYSTOSCOPY WITH RETROGRADE PYELOGRAM, URETEROSCOPY AND STENT PLACEMENT Bilateral 08/11/2013   Procedure: CYSTOSCOPY WITH RETROGRADE PYELOGRAM, URETEROSCOPY AND STENT PLACEMENT;  Surgeon: Alexis Frock, MD;  [facility];  Service: Urology;  Laterality: Bilateral;   CYSTOSCOPY WITH URETEROSCOPY AND STENT PLACEMENT Bilateral 08/29/2013   Procedure: CYSTOSCOPY WITH BILATERAL URETEROSCOPY AND STENT REPLACEMENTS, stone  extraction;  Surgeon: Alexis Frock, MD;  Location: [facility];  Service: Urology;  Laterality: Bilateral;   CYSTOSCOPY/RETROGRADE/URETEROSCOPY/STONE EXTRACTION WITH BASKET Bilateral 01/31/2016   Procedure: CYSTOSCOPY/BILATERAL RETROGRADE PYELOGRAMS/BILATERAL URETEROSCOPY/BILATERAL STONE EXTRACTION WITH BASKET/RIGHT URETERAL STENT PLACEMENT;  Surgeon: Raynelle Bring, MD;  [facility];  Service: Urology;  Laterality: Bilateral;   HEMIARTHROPLASTY SHOULDER FRACTURE Left 1992   HOLMIUM LASER APPLICATION Bilateral 48/54/6270   Procedure: HOLMIUM LASER APPLICATION;  Surgeon: Alexis Frock, MD;  Location: [facility];  Service: Urology;  Laterality: Bilateral;   Patient Active Problem List   Diagnosis Date Noted   History of resection of meningioma 11/02/2020   Peripheral edema    Hypoalbuminemia due to protein-calorie malnutrition (HCC)    Transaminitis    Tremor    Iron deficiency anemia    CKD (chronic kidney disease), stage III ([place]) 09/10/2020   Vasogenic Cerebral edema ([place]) 09/04/2020   Cancer of skin, squamous cell 09/04/2020   Nephrolithiasis 09/04/2020   Essential hypertension 11/01/2015   Osteoporosis 10/30/2015   REFERRING PROVIDER: Duffy Rhody MD  REFERRING DIAG: Cervical spondylosis without myelopathy.  THERAPY DIAG:  Cervicalgia  Abnormal posture  Rationale for Evaluation and Treatment Rehabilitation  ONSET DATE: "2-3 years ago".  SUBJECTIVE:  SUBJECTIVE STATEMENT: Doing very well.  Pain at a 1. PERTINENT HISTORY:  Cancer (resolved), OP, left TSA, OA. PAIN:  Are you having pain? No   PRECAUTIONS: Other: OP.  WEIGHT BEARING RESTRICTIONS No   OBJECTIVE:  PATIENT SURVEYS:  FOTO Complete.   TODAY'S TREATMENT:  Manual  Therapy Soft Tissue Mobilization: L UT and cervical paraspinals, to reduce tone and pain/soreness  x 11 minutes.  Modalities  Date: 05/03/2022 Unattended Estim: Cervical, Pre-Mod 80-150 hz, 20 mins, Tone Combo: Cervical, sym. biphasic, 12 mins, Tone    ASSESSMENT:  CLINICAL IMPRESSION: Patient very pleased with her progress.  Low pain-level upon presentation to the clinic today.  Pain-free after treatment.  GOALS: Goals reviewed with patient? Yes  SHORT TERM GOALS: Target date: 04/22/2022   Independent with HEP. Baseline:  Goal status: On-going   LONG TERM GOALS: Target date: 05/20/2022  Increase active cervical rotation to 55 degrees+ so patient can turn head more easily while driving.  Goal status: On-going  2.  Eliminate headaches. Baseline:  Goal status: On-going  3.  Perform ADL's with neck pain not > 3/10. Baseline:  Goal status: On-going     PLAN: PT FREQUENCY: 2x/week  PT DURATION: 6 weeks  PLANNED INTERVENTIONS: Therapeutic exercises, Therapeutic activity, Patient/Family education, Self Care, Dry Needling, Electrical stimulation, Cryotherapy, Moist heat, Ultrasound, and Manual therapy  PLAN FOR NEXT SESSION: Modalities and STW/M, postural exercises, chin tucks with extension, towel extensions.   Jovonne Wilton, [place], PT 05/03/2022, 1:57 PM

## 2022-05-06 ENCOUNTER — Encounter: Payer: Self-pay | Admitting: Physical Therapy

## 2022-05-06 ENCOUNTER — Ambulatory Visit: Payer: Medicare Other | Admitting: Physical Therapy

## 2022-05-06 DIAGNOSIS — M542 Cervicalgia: Secondary | ICD-10-CM | POA: Diagnosis not present

## 2022-05-06 DIAGNOSIS — R293 Abnormal posture: Secondary | ICD-10-CM | POA: Diagnosis not present

## 2022-05-06 NOTE — Therapy (Signed)
OUTPATIENT PHYSICAL THERAPY CERVICAL TREATMENT   Patient Name: [name] Page MRN: [id number] DOB:1951/09/02, 71 y.o., female Today's Date: 05/06/2022   PT End of Session - 05/06/22 1141     Visit Number 10    Number of Visits 12    Date for PT Re-Evaluation 07/04/22    Authorization Type FOTO AT LEAST EVERY 5TH VISIT.  PROGRESS NOTE AT 10TH VISIT.  KX MODIFIER AFTER 15 VISITS.    PT Start Time 1109    PT Stop Time 1159    PT Time Calculation (min) 50 min    Activity Tolerance Patient tolerated treatment well    Behavior During Therapy WFL for tasks assessed/performed             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   sees Dr Allyson Sabal   Cancer [facility])    melanoma   Dysuria    Frequency of urination    Hematuria    History of kidney stones    Meningioma ([place]) 09/04/2020   Osteoporosis    Renal calculus, bilateral    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   Urgency of urination    Wears glasses    Past Surgical History:  Procedure Laterality Date   APPLICATION OF CRANIAL NAVIGATION Right 09/11/2020   Procedure: APPLICATION OF CRANIAL NAVIGATION;  Surgeon: Vallarie Mare, MD;  Location: [place];  Service: Neurosurgery;  Laterality: Right;   CRANIOTOMY Right 09/11/2020   Procedure: Right Pterional Craniotomy for resection of anterior skull base mass with Brain Lab;  Surgeon: Vallarie Mare, MD;  Location: [place];  Service: Neurosurgery;  Laterality: Right;   CYSTOSCOPY WITH RETROGRADE PYELOGRAM, URETEROSCOPY AND STENT PLACEMENT Bilateral 08/11/2013   Procedure: CYSTOSCOPY WITH RETROGRADE PYELOGRAM, URETEROSCOPY AND STENT PLACEMENT;  Surgeon: Alexis Frock, MD;  [facility];  Service: Urology;  Laterality: Bilateral;   CYSTOSCOPY WITH URETEROSCOPY AND STENT PLACEMENT Bilateral 08/29/2013   Procedure: CYSTOSCOPY WITH BILATERAL URETEROSCOPY AND STENT REPLACEMENTS, stone  extraction;  Surgeon: Alexis Frock, MD;  Location: [facility];  Service: Urology;  Laterality: Bilateral;   CYSTOSCOPY/RETROGRADE/URETEROSCOPY/STONE EXTRACTION WITH BASKET Bilateral 01/31/2016   Procedure: CYSTOSCOPY/BILATERAL RETROGRADE PYELOGRAMS/BILATERAL URETEROSCOPY/BILATERAL STONE EXTRACTION WITH BASKET/RIGHT URETERAL STENT PLACEMENT;  Surgeon: Raynelle Bring, MD;  [facility];  Service: Urology;  Laterality: Bilateral;   HEMIARTHROPLASTY SHOULDER FRACTURE Left 1992   HOLMIUM LASER APPLICATION Bilateral 82/42/3536   Procedure: HOLMIUM LASER APPLICATION;  Surgeon: Alexis Frock, MD;  Location: [facility];  Service: Urology;  Laterality: Bilateral;   Patient Active Problem List   Diagnosis Date Noted   History of resection of meningioma 11/02/2020   Peripheral edema    Hypoalbuminemia due to protein-calorie malnutrition (HCC)    Transaminitis    Tremor    Iron deficiency anemia    CKD (chronic kidney disease), stage III ([place]) 09/10/2020   Vasogenic Cerebral edema ([place]) 09/04/2020   Cancer of skin, squamous cell 09/04/2020   Nephrolithiasis 09/04/2020   Essential hypertension 11/01/2015   Osteoporosis 10/30/2015   REFERRING PROVIDER: Duffy Rhody MD  REFERRING DIAG: Cervical spondylosis without myelopathy.  THERAPY DIAG:  Cervicalgia  Abnormal posture  Rationale for Evaluation and Treatment Rehabilitation  ONSET DATE: "2-3 years ago".  SUBJECTIVE:  SUBJECTIVE STATEMENT: Doing very well.  Pain at a 1-2. PERTINENT HISTORY:  Cancer (resolved), OP, left TSA, OA. PAIN:  Are you having pain? No   PRECAUTIONS: Other: OP.  WEIGHT BEARING RESTRICTIONS No   OBJECTIVE:  PATIENT SURVEYS:  FOTO Complete.   TODAY'S TREATMENT:  Manual  Therapy Soft Tissue Mobilization: L UT and cervical paraspinals, to reduce tone and pain/soreness  x 11 minutes including ischemic release technique to her left Romie Levee and UT  Modalities  Date: 04/29/2022 Unattended Estim: Cervical, Pre-Mod 80-150 hz, 20 mins, Tone Combo: Cervical, sym. biphasic, 12 mins, Tone    ASSESSMENT:  CLINICAL IMPRESSION: Patient very pleased with her progress.  Low pain-level upon presentation to the clinic today.  Pain-free after treatment with a significant improvement in active cervical range of motion:  Left is 43 degrees and right is 32 degrees.  GOALS: Goals reviewed with patient? Yes  SHORT TERM GOALS: Target date: 04/22/2022   Independent with HEP. Baseline:  Goal status: On-going   LONG TERM GOALS: Target date: 05/20/2022  Increase active cervical rotation to 55 degrees+ so patient can turn head more easily while driving.  Goal status: On-going  2.  Eliminate headaches. Baseline:  Goal status: On-going  3.  Perform ADL's with neck pain not > 3/10. Baseline:  Goal status: On-going     PLAN: PT FREQUENCY: 2x/week  PT DURATION: 6 weeks  PLANNED INTERVENTIONS: Therapeutic exercises, Therapeutic activity, Patient/Family education, Self Care, Dry Needling, Electrical stimulation, Cryotherapy, Moist heat, Ultrasound, and Manual therapy  PLAN FOR NEXT SESSION: Modalities and STW/M, postural exercises, chin tucks with extension, towel extensions.   Progress Note Reporting Period  04/05/22 to 05/06/22.  See note below for Objective Data and Assessment of Progress/Goals. Patient is making very good progress toward goals and has demonstrated good improve in her active cervical range of motion.    Librado Guandique, [place], PT 05/06/2022, 11:59 AM

## 2022-05-10 DIAGNOSIS — C44612 Basal cell carcinoma of skin of right upper limb, including shoulder: Secondary | ICD-10-CM | POA: Diagnosis not present

## 2022-05-10 DIAGNOSIS — Z48817 Encounter for surgical aftercare following surgery on the skin and subcutaneous tissue: Secondary | ICD-10-CM | POA: Diagnosis not present

## 2022-05-10 DIAGNOSIS — Z4801 Encounter for change or removal of surgical wound dressing: Secondary | ICD-10-CM | POA: Diagnosis not present

## 2022-05-11 ENCOUNTER — Ambulatory Visit: Payer: Medicare Other | Admitting: Physical Therapy

## 2022-05-11 DIAGNOSIS — R293 Abnormal posture: Secondary | ICD-10-CM | POA: Diagnosis not present

## 2022-05-11 DIAGNOSIS — M542 Cervicalgia: Secondary | ICD-10-CM | POA: Diagnosis not present

## 2022-05-11 NOTE — Therapy (Addendum)
OUTPATIENT PHYSICAL THERAPY CERVICAL TREATMENT   Patient Name: [name] Page MRN: [id number] DOB:16-Dec-1950, 71 y.o., female Today's Date: 05/11/2022   PT End of Session - 05/11/22 1203     Visit Number 11    Number of Visits 12    Date for PT Re-Evaluation 07/04/22    Authorization Type FOTO AT LEAST EVERY 5TH VISIT.  PROGRESS NOTE AT 10TH VISIT.  KX MODIFIER AFTER 15 VISITS.    PT Start Time 1115    PT Stop Time 1204    PT Time Calculation (min) 49 min             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   sees Dr Allyson Sabal   Cancer [facility])    melanoma   Dysuria    Frequency of urination    Hematuria    History of kidney stones    Meningioma ([place]) 09/04/2020   Osteoporosis    Renal calculus, bilateral    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   Urgency of urination    Wears glasses    Past Surgical History:  Procedure Laterality Date   APPLICATION OF CRANIAL NAVIGATION Right 09/11/2020   Procedure: APPLICATION OF CRANIAL NAVIGATION;  Surgeon: Vallarie Mare, MD;  Location: [place];  Service: Neurosurgery;  Laterality: Right;   CRANIOTOMY Right 09/11/2020   Procedure: Right Pterional Craniotomy for resection of anterior skull base mass with Brain Lab;  Surgeon: Vallarie Mare, MD;  Location: [place];  Service: Neurosurgery;  Laterality: Right;   CYSTOSCOPY WITH RETROGRADE PYELOGRAM, URETEROSCOPY AND STENT PLACEMENT Bilateral 08/11/2013   Procedure: CYSTOSCOPY WITH RETROGRADE PYELOGRAM, URETEROSCOPY AND STENT PLACEMENT;  Surgeon: Alexis Frock, MD;  [facility];  Service: Urology;  Laterality: Bilateral;   CYSTOSCOPY WITH URETEROSCOPY AND STENT PLACEMENT Bilateral 08/29/2013   Procedure: CYSTOSCOPY WITH BILATERAL URETEROSCOPY AND STENT REPLACEMENTS, stone extraction;  Surgeon: Alexis Frock, MD;  Location: [facility];  Service: Urology;  Laterality:  Bilateral;   CYSTOSCOPY/RETROGRADE/URETEROSCOPY/STONE EXTRACTION WITH BASKET Bilateral 01/31/2016   Procedure: CYSTOSCOPY/BILATERAL RETROGRADE PYELOGRAMS/BILATERAL URETEROSCOPY/BILATERAL STONE EXTRACTION WITH BASKET/RIGHT URETERAL STENT PLACEMENT;  Surgeon: Raynelle Bring, MD;  [facility];  Service: Urology;  Laterality: Bilateral;   HEMIARTHROPLASTY SHOULDER FRACTURE Left 1992   HOLMIUM LASER APPLICATION Bilateral 56/38/9373   Procedure: HOLMIUM LASER APPLICATION;  Surgeon: Alexis Frock, MD;  Location: [facility];  Service: Urology;  Laterality: Bilateral;   Patient Active Problem List   Diagnosis Date Noted   History of resection of meningioma 11/02/2020   Peripheral edema    Hypoalbuminemia due to protein-calorie malnutrition (HCC)    Transaminitis    Tremor    Iron deficiency anemia    CKD (chronic kidney disease), stage III ([place]) 09/10/2020   Vasogenic Cerebral edema ([place]) 09/04/2020   Cancer of skin, squamous cell 09/04/2020   Nephrolithiasis 09/04/2020   Essential hypertension 11/01/2015   Osteoporosis 10/30/2015   REFERRING PROVIDER: Duffy Rhody MD  REFERRING DIAG: Cervical spondylosis without myelopathy.  THERAPY DIAG:  Cervicalgia  Abnormal posture  Rationale for Evaluation and Treatment Rehabilitation  ONSET DATE: "2-3 years ago".  SUBJECTIVE:  SUBJECTIVE STATEMENT: Pain has been staying low. 1-2/10. PERTINENT HISTORY:  Cancer (resolved), OP, left TSA, OA. PAIN:  Are you having pain? No   PRECAUTIONS: Other: OP.  WEIGHT BEARING RESTRICTIONS No   OBJECTIVE:  PATIENT SURVEYS:  FOTO Complete.   TODAY'S TREATMENT:  Manual Therapy Soft Tissue Mobilization: L UT and cervical paraspinals, to reduce tone and pain/soreness  x 11 minutes  including ischemic release technique to her left Romie Levee and UT  Modalities  Date: 05/11/2022 Unattended Estim: Cervical, Pre-Mod 80-150 hz, 15 mins, Tone Combo: Cervical, sym. biphasic, 12 mins, Tone    ASSESSMENT:  CLINICAL IMPRESSION: Patient very pleased with her progress.  Continued low pain-level upon presentation to the clinic today.  Tone is lev scap responded very well to STW/M inclusing ischemic release technique.  GOALS: Goals reviewed with patient? Yes  SHORT TERM GOALS: Target date: 04/22/2022   Independent with HEP. Baseline:  Goal status: On-going   LONG TERM GOALS: Target date: 05/20/2022  Increase active cervical rotation to 55 degrees+ so patient can turn head more easily while driving.  Goal status: On-going  2.  Eliminate headaches. Baseline:  Goal status: On-going  3.  Perform ADL's with neck pain not > 3/10. Baseline:  Goal status: On-going     PLAN: PT FREQUENCY: 2x/week  PT DURATION: 6 weeks  PLANNED INTERVENTIONS: Therapeutic exercises, Therapeutic activity, Patient/Family education, Self Care, Dry Needling, Electrical stimulation, Cryotherapy, Moist heat, Ultrasound, and Manual therapy  PLAN FOR NEXT SESSION: Modalities and STW/M, postural exercises, chin tucks with extension, towel extensions.     Lya Holben, [place], PT 05/11/2022, 12:07 PM

## 2022-05-14 ENCOUNTER — Ambulatory Visit: Payer: Medicare Other | Admitting: Physical Therapy

## 2022-05-14 ENCOUNTER — Encounter: Payer: Self-pay | Admitting: Physical Therapy

## 2022-05-14 DIAGNOSIS — M542 Cervicalgia: Secondary | ICD-10-CM | POA: Diagnosis not present

## 2022-05-14 DIAGNOSIS — R293 Abnormal posture: Secondary | ICD-10-CM

## 2022-05-14 NOTE — Therapy (Signed)
OUTPATIENT PHYSICAL THERAPY CERVICAL TREATMENT   Patient Name: [name] Page MRN: [id number] DOB:1951/08/26, 71 y.o., female Today's Date: 05/14/2022   PT End of Session - 05/14/22 1224     Visit Number 12    Number of Visits 16    Date for PT Re-Evaluation 07/04/22    Authorization Type FOTO AT LEAST EVERY 5TH VISIT.  PROGRESS NOTE AT 10TH VISIT.  KX MODIFIER AFTER 15 VISITS.    PT Start Time 1115    PT Stop Time 1207    PT Time Calculation (min) 52 min    Activity Tolerance Patient tolerated treatment well    Behavior During Therapy WFL for tasks assessed/performed             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   sees Dr Allyson Sabal   Cancer [facility])    melanoma   Dysuria    Frequency of urination    Hematuria    History of kidney stones    Meningioma ([place]) 09/04/2020   Osteoporosis    Renal calculus, bilateral    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   Urgency of urination    Wears glasses    Past Surgical History:  Procedure Laterality Date   APPLICATION OF CRANIAL NAVIGATION Right 09/11/2020   Procedure: APPLICATION OF CRANIAL NAVIGATION;  Surgeon: Vallarie Mare, MD;  Location: [place];  Service: Neurosurgery;  Laterality: Right;   CRANIOTOMY Right 09/11/2020   Procedure: Right Pterional Craniotomy for resection of anterior skull base mass with Brain Lab;  Surgeon: Vallarie Mare, MD;  Location: [place];  Service: Neurosurgery;  Laterality: Right;   CYSTOSCOPY WITH RETROGRADE PYELOGRAM, URETEROSCOPY AND STENT PLACEMENT Bilateral 08/11/2013   Procedure: CYSTOSCOPY WITH RETROGRADE PYELOGRAM, URETEROSCOPY AND STENT PLACEMENT;  Surgeon: Alexis Frock, MD;  [facility];  Service: Urology;  Laterality: Bilateral;   CYSTOSCOPY WITH URETEROSCOPY AND STENT PLACEMENT Bilateral 08/29/2013   Procedure: CYSTOSCOPY WITH BILATERAL URETEROSCOPY AND STENT REPLACEMENTS, stone  extraction;  Surgeon: Alexis Frock, MD;  Location: [facility];  Service: Urology;  Laterality: Bilateral;   CYSTOSCOPY/RETROGRADE/URETEROSCOPY/STONE EXTRACTION WITH BASKET Bilateral 01/31/2016   Procedure: CYSTOSCOPY/BILATERAL RETROGRADE PYELOGRAMS/BILATERAL URETEROSCOPY/BILATERAL STONE EXTRACTION WITH BASKET/RIGHT URETERAL STENT PLACEMENT;  Surgeon: Raynelle Bring, MD;  [facility];  Service: Urology;  Laterality: Bilateral;   HEMIARTHROPLASTY SHOULDER FRACTURE Left 1992   HOLMIUM LASER APPLICATION Bilateral 70/48/8891   Procedure: HOLMIUM LASER APPLICATION;  Surgeon: Alexis Frock, MD;  Location: [facility];  Service: Urology;  Laterality: Bilateral;   Patient Active Problem List   Diagnosis Date Noted   History of resection of meningioma 11/02/2020   Peripheral edema    Hypoalbuminemia due to protein-calorie malnutrition (HCC)    Transaminitis    Tremor    Iron deficiency anemia    CKD (chronic kidney disease), stage III ([place]) 09/10/2020   Vasogenic Cerebral edema ([place]) 09/04/2020   Cancer of skin, squamous cell 09/04/2020   Nephrolithiasis 09/04/2020   Essential hypertension 11/01/2015   Osteoporosis 10/30/2015   REFERRING PROVIDER: Duffy Rhody MD  REFERRING DIAG: Cervical spondylosis without myelopathy.  THERAPY DIAG:  Cervicalgia  Abnormal posture  Rationale for Evaluation and Treatment Rehabilitation  ONSET DATE: "2-3 years ago".  SUBJECTIVE:  SUBJECTIVE STATEMENT: Pain has been staying low. 1-2/10. PERTINENT HISTORY:  Cancer (resolved), OP, left TSA, OA. PAIN:  Are you having pain? No   PRECAUTIONS: Other: OP.  WEIGHT BEARING RESTRICTIONS No   OBJECTIVE:  PATIENT SURVEYS:  FOTO Complete.   TODAY'S TREATMENT:  Manual  Therapy Soft Tissue Mobilization: L UT and cervical paraspinals, to reduce tone and pain/soreness  x 11 minutes including ischemic release technique to her left Romie Levee and UT  Modalities  Date: 04/29/2022            Combo e'stim/US at 1.50 W/CM2 at 3.3 mHz on 20% (small sound head) x 12 minutes to patient's left cervical musculature/UT f/b STW/M x 11 minutes including ischemic release technique to decrease tone.  ASSESSMENT:  CLINICAL IMPRESSION: Patient has made excellent progress and would like to continue for a few more visits.  Normal modality response following removal of modality.  No pain complaints after treatment today.   GOALS: Goals reviewed with patient? Yes  SHORT TERM GOALS: Target date: 04/22/2022   Independent with HEP. Baseline:  Goal status: On-going   LONG TERM GOALS: Target date: 05/20/2022  Increase active cervical rotation to 55 degrees+ so patient can turn head more easily while driving.  Goal status: On-going  2.  Eliminate headaches. Baseline:  Goal status: On-going  3.  Perform ADL's with neck pain not > 3/10. Baseline:  Goal status: On-going     PLAN: PT FREQUENCY: 2x/week  PT DURATION: 6 weeks  PLANNED INTERVENTIONS: Therapeutic exercises, Therapeutic activity, Patient/Family education, Self Care, Dry Needling, Electrical stimulation, Cryotherapy, Moist heat, Ultrasound, and Manual therapy  PLAN FOR NEXT SESSION: Modalities and STW/M, postural exercises, chin tucks with extension, towel extensions.     Lainie Daubert, [place], PT 05/14/2022, 12:26 PM

## 2022-05-19 ENCOUNTER — Ambulatory Visit: Payer: Medicare Other | Admitting: Physical Therapy

## 2022-05-19 DIAGNOSIS — M542 Cervicalgia: Secondary | ICD-10-CM | POA: Diagnosis not present

## 2022-05-19 DIAGNOSIS — R293 Abnormal posture: Secondary | ICD-10-CM | POA: Diagnosis not present

## 2022-05-19 NOTE — Therapy (Signed)
OUTPATIENT PHYSICAL THERAPY CERVICAL TREATMENT   Patient Name: [name] Page MRN: [id number] DOB:[birth year], 71 y.o., female Today's Date: 05/19/2022   PT End of Session - 05/19/22 1158     Visit Number 13    Number of Visits 16    Date for PT Re-Evaluation 07/04/22    Authorization Type FOTO AT LEAST EVERY 5TH VISIT.  PROGRESS NOTE AT 10TH VISIT.  KX MODIFIER AFTER 15 VISITS.    PT Start Time 1115    PT Stop Time 1202    PT Time Calculation (min) 47 min    Activity Tolerance Patient tolerated treatment well    Behavior During Therapy WFL for tasks assessed/performed             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   sees Dr Allyson Sabal   Cancer [facility])    melanoma   Dysuria    Frequency of urination    Hematuria    History of kidney stones    Meningioma ([place]) 09/04/2020   Osteoporosis    Renal calculus, bilateral    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   Urgency of urination    Wears glasses    Past Surgical History:  Procedure Laterality Date   APPLICATION OF CRANIAL NAVIGATION Right 09/11/2020   Procedure: APPLICATION OF CRANIAL NAVIGATION;  Surgeon: Vallarie Mare, MD;  Location: [place];  Service: Neurosurgery;  Laterality: Right;   CRANIOTOMY Right 09/11/2020   Procedure: Right Pterional Craniotomy for resection of anterior skull base mass with Brain Lab;  Surgeon: Vallarie Mare, MD;  Location: [place];  Service: Neurosurgery;  Laterality: Right;   CYSTOSCOPY WITH RETROGRADE PYELOGRAM, URETEROSCOPY AND STENT PLACEMENT Bilateral 08/11/2013   Procedure: CYSTOSCOPY WITH RETROGRADE PYELOGRAM, URETEROSCOPY AND STENT PLACEMENT;  Surgeon: Alexis Frock, MD;  [facility];  Service: Urology;  Laterality: Bilateral;   CYSTOSCOPY WITH URETEROSCOPY AND STENT PLACEMENT Bilateral 08/29/2013   Procedure: CYSTOSCOPY WITH BILATERAL URETEROSCOPY AND STENT REPLACEMENTS, stone  extraction;  Surgeon: Alexis Frock, MD;  Location: [facility];  Service: Urology;  Laterality: Bilateral;   CYSTOSCOPY/RETROGRADE/URETEROSCOPY/STONE EXTRACTION WITH BASKET Bilateral 01/31/2016   Procedure: CYSTOSCOPY/BILATERAL RETROGRADE PYELOGRAMS/BILATERAL URETEROSCOPY/BILATERAL STONE EXTRACTION WITH BASKET/RIGHT URETERAL STENT PLACEMENT;  Surgeon: Raynelle Bring, MD;  [facility];  Service: Urology;  Laterality: Bilateral;   HEMIARTHROPLASTY SHOULDER FRACTURE Left 1992   HOLMIUM LASER APPLICATION Bilateral 54/00/8676   Procedure: HOLMIUM LASER APPLICATION;  Surgeon: Alexis Frock, MD;  [facility];  Service: Urology;  Laterality: Bilateral;   Patient Active Problem List   Diagnosis Date Noted   History of resection of meningioma 11/02/2020   Peripheral edema    Hypoalbuminemia due to protein-calorie malnutrition (HCC)    Transaminitis    Tremor    Iron deficiency anemia    CKD (chronic kidney disease), stage III ([place]) 09/10/2020   Vasogenic Cerebral edema ([place]) 09/04/2020   Cancer of skin, squamous cell 09/04/2020   Nephrolithiasis 09/04/2020   Essential hypertension 11/01/2015   Osteoporosis 10/30/2015   REFERRING PROVIDER: Duffy Rhody MD  REFERRING DIAG: Cervical spondylosis without myelopathy.  THERAPY DIAG:  No diagnosis found.  Rationale for Evaluation and Treatment Rehabilitation  ONSET DATE: "2-3 years ago".  SUBJECTIVE:  SUBJECTIVE STATEMENT: Pain has been staying low. 1-2/10. PERTINENT HISTORY:  Cancer (resolved), OP, left TSA, OA. PAIN:  Are you having pain? No   PRECAUTIONS: Other: OP.  WEIGHT BEARING RESTRICTIONS No   OBJECTIVE:  PATIENT SURVEYS:  FOTO Complete.   TODAY'S TREATMENT:  05/19/22.  Manual  Therapy Soft Tissue Mobilization: L UT and cervical paraspinals, to reduce tone and pain/soreness  x 11 minutes including ischemic release technique to her left Romie Levee and UT  Modalities  Date: 05/19/2022            Combo e'stim/US at 1.50 W/CM2 at 3.3 mHz on 20% (small sound head) x 12 minutes to patient's left cervical musculature/UT f/b IFC x 15 minutes. ASSESSMENT:  CLINICAL IMPRESSION: Patient very pleased with her progress stating she feels at least "70% better" now.  She did very well with treatment today with normal modality response following removal of modality.  GOALS: Goals reviewed with patient? Yes  SHORT TERM GOALS: Target date: 04/22/2022   Independent with HEP. Baseline:  Goal status: On-going   LONG TERM GOALS: Target date: 05/20/2022  Increase active cervical rotation to 55 degrees+ so patient can turn head more easily while driving.  Goal status: On-going  2.  Eliminate headaches. Baseline:  Goal status: On-going  3.  Perform ADL's with neck pain not > 3/10. Baseline:  Goal status: On-going     PLAN: PT FREQUENCY: 2x/week  PT DURATION: 6 weeks  PLANNED INTERVENTIONS: Therapeutic exercises, Therapeutic activity, Patient/Family education, Self Care, Dry Needling, Electrical stimulation, Cryotherapy, Moist heat, Ultrasound, and Manual therapy  PLAN FOR NEXT SESSION: Modalities and STW/M, postural exercises, chin tucks with extension, towel extensions.     Luke Falero, [place], PT 05/19/2022, 12:06 PM

## 2022-05-26 ENCOUNTER — Encounter: Payer: Self-pay | Admitting: Physical Therapy

## 2022-05-26 ENCOUNTER — Ambulatory Visit: Payer: Medicare Other | Attending: Neurosurgery | Admitting: Physical Therapy

## 2022-05-26 DIAGNOSIS — M542 Cervicalgia: Secondary | ICD-10-CM | POA: Diagnosis not present

## 2022-05-26 DIAGNOSIS — R293 Abnormal posture: Secondary | ICD-10-CM | POA: Insufficient documentation

## 2022-05-26 NOTE — Therapy (Signed)
OUTPATIENT PHYSICAL THERAPY CERVICAL TREATMENT   Patient Name: [name] Page MRN: [id number] DOB:03-24-1951, 71 y.o., female Today's Date: 05/26/2022   PT End of Session - 05/26/22 1117     Visit Number 14    Number of Visits 16    Date for PT Re-Evaluation 07/04/22    Authorization Type FOTO AT LEAST EVERY 5TH VISIT.  PROGRESS NOTE AT 10TH VISIT.  KX MODIFIER AFTER 15 VISITS.    PT Start Time 1119    PT Stop Time 1205    PT Time Calculation (min) 46 min    Activity Tolerance Patient tolerated treatment well    Behavior During Therapy WFL for tasks assessed/performed             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   sees Dr Allyson Sabal   Cancer [facility])    melanoma   Dysuria    Frequency of urination    Hematuria    History of kidney stones    Meningioma ([place]) 09/04/2020   Osteoporosis    Renal calculus, bilateral    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   Urgency of urination    Wears glasses    Past Surgical History:  Procedure Laterality Date   APPLICATION OF CRANIAL NAVIGATION Right 09/11/2020   Procedure: APPLICATION OF CRANIAL NAVIGATION;  Surgeon: Vallarie Mare, MD;  Location: [place];  Service: Neurosurgery;  Laterality: Right;   CRANIOTOMY Right 09/11/2020   Procedure: Right Pterional Craniotomy for resection of anterior skull base mass with Brain Lab;  Surgeon: Vallarie Mare, MD;  Location: [place];  Service: Neurosurgery;  Laterality: Right;   CYSTOSCOPY WITH RETROGRADE PYELOGRAM, URETEROSCOPY AND STENT PLACEMENT Bilateral 08/11/2013   Procedure: CYSTOSCOPY WITH RETROGRADE PYELOGRAM, URETEROSCOPY AND STENT PLACEMENT;  Surgeon: Alexis Frock, MD;  [facility];  Service: Urology;  Laterality: Bilateral;   CYSTOSCOPY WITH URETEROSCOPY AND STENT PLACEMENT Bilateral 08/29/2013   Procedure: CYSTOSCOPY WITH BILATERAL URETEROSCOPY AND STENT REPLACEMENTS, stone  extraction;  Surgeon: Alexis Frock, MD;  Location: [facility];  Service: Urology;  Laterality: Bilateral;   CYSTOSCOPY/RETROGRADE/URETEROSCOPY/STONE EXTRACTION WITH BASKET Bilateral 01/31/2016   Procedure: CYSTOSCOPY/BILATERAL RETROGRADE PYELOGRAMS/BILATERAL URETEROSCOPY/BILATERAL STONE EXTRACTION WITH BASKET/RIGHT URETERAL STENT PLACEMENT;  Surgeon: Raynelle Bring, MD;  [facility];  Service: Urology;  Laterality: Bilateral;   HEMIARTHROPLASTY SHOULDER FRACTURE Left 1992   HOLMIUM LASER APPLICATION Bilateral 69/48/5462   Procedure: HOLMIUM LASER APPLICATION;  Surgeon: Alexis Frock, MD;  Location: [facility];  Service: Urology;  Laterality: Bilateral;   Patient Active Problem List   Diagnosis Date Noted   History of resection of meningioma 11/02/2020   Peripheral edema    Hypoalbuminemia due to protein-calorie malnutrition (HCC)    Transaminitis    Tremor    Iron deficiency anemia    CKD (chronic kidney disease), stage III ([place]) 09/10/2020   Vasogenic Cerebral edema ([place]) 09/04/2020   Cancer of skin, squamous cell 09/04/2020   Nephrolithiasis 09/04/2020   Essential hypertension 11/01/2015   Osteoporosis 10/30/2015   REFERRING PROVIDER: Duffy Rhody MD  REFERRING DIAG: Cervical spondylosis without myelopathy.  THERAPY DIAG:  Cervicalgia  Abnormal posture  Rationale for Evaluation and Treatment Rehabilitation  ONSET DATE: "2-3 years ago".  SUBJECTIVE:  SUBJECTIVE STATEMENT: Pain has had some stiffness and a little headache.  PERTINENT HISTORY:  Cancer (resolved), OP, left TSA, OA. PAIN:  Are you having pain? Yes: NPRS scale: 4/10 Pain location: L neck Pain description: stiff Aggravating factors:   Relieving factors:      PRECAUTIONS:  Other: OP.  WEIGHT BEARING RESTRICTIONS No   OBJECTIVE:  PATIENT SURVEYS:  FOTO Complete.   TODAY'S TREATMENT:  05/19/22. Modalities  Date: 05/26/2022 Unattended Estim: Cervical, IFC, 15 mins, Pain and Tone Combo: Cervical, 1.5w/cm2, 100%, 10 mins, Pain and Tone  Manual Therapy Soft Tissue Mobilization: L UT/cervical paraspinals, to reduce pain and tightness     ASSESSMENT:  CLINICAL IMPRESSION: Patient presented in clinic with reports of headache and L sided tightness. Patient presented with increased tightness of the L cervical paraspinals, UT region. No complaints of soreness and tenderness during manual therapy session. Normal modalities response noted following removal of the modalities.  GOALS: Goals reviewed with patient? Yes  SHORT TERM GOALS: Target date: 04/22/2022   Independent with HEP. Baseline:  Goal status: On-going   LONG TERM GOALS: Target date: 05/20/2022  Increase active cervical rotation to 55 degrees+ so patient can turn head more easily while driving.  Goal status: On-going  2.  Eliminate headaches. Baseline:  Goal status: On-going  3.  Perform ADL's with neck pain not > 3/10. Baseline:  Goal status: On-going     PLAN: PT FREQUENCY: 2x/week  PT DURATION: 6 weeks  PLANNED INTERVENTIONS: Therapeutic exercises, Therapeutic activity, Patient/Family education, Self Care, Dry Needling, Electrical stimulation, Cryotherapy, Moist heat, Ultrasound, and Manual therapy  PLAN FOR NEXT SESSION: Modalities and STW/M, postural exercises, chin tucks with extension, towel extensions.     Standley Brooking, PTA 05/26/2022, 12:09 PM

## 2022-06-02 ENCOUNTER — Ambulatory Visit: Payer: Medicare Other | Admitting: Physical Therapy

## 2022-06-02 ENCOUNTER — Encounter: Payer: Self-pay | Admitting: Physical Therapy

## 2022-06-02 DIAGNOSIS — M542 Cervicalgia: Secondary | ICD-10-CM | POA: Diagnosis not present

## 2022-06-02 DIAGNOSIS — R293 Abnormal posture: Secondary | ICD-10-CM | POA: Diagnosis not present

## 2022-06-02 NOTE — Therapy (Signed)
OUTPATIENT PHYSICAL THERAPY CERVICAL TREATMENT   Patient Name: [name] Page MRN: [id number] DOB:06-05-1951, 71 y.o., female Today's Date: 06/02/2022   PT End of Session - 06/02/22 1113     Visit Number 15    Number of Visits 16    Date for PT Re-Evaluation 07/04/22    Authorization Type FOTO AT LEAST EVERY 5TH VISIT.  PROGRESS NOTE AT 10TH VISIT.  KX MODIFIER AFTER 15 VISITS.    PT Start Time 1119    PT Stop Time 1156    PT Time Calculation (min) 37 min    Activity Tolerance Patient tolerated treatment well    Behavior During Therapy WFL for tasks assessed/performed             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   sees Dr Allyson Sabal   Cancer [facility])    melanoma   Dysuria    Frequency of urination    Hematuria    History of kidney stones    Meningioma ([place]) 09/04/2020   Osteoporosis    Renal calculus, bilateral    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   Urgency of urination    Wears glasses    Past Surgical History:  Procedure Laterality Date   APPLICATION OF CRANIAL NAVIGATION Right 09/11/2020   Procedure: APPLICATION OF CRANIAL NAVIGATION;  Surgeon: Vallarie Mare, MD;  Location: [place];  Service: Neurosurgery;  Laterality: Right;   CRANIOTOMY Right 09/11/2020   Procedure: Right Pterional Craniotomy for resection of anterior skull base mass with Brain Lab;  Surgeon: Vallarie Mare, MD;  Location: [place];  Service: Neurosurgery;  Laterality: Right;   CYSTOSCOPY WITH RETROGRADE PYELOGRAM, URETEROSCOPY AND STENT PLACEMENT Bilateral 08/11/2013   Procedure: CYSTOSCOPY WITH RETROGRADE PYELOGRAM, URETEROSCOPY AND STENT PLACEMENT;  Surgeon: Alexis Frock, MD;  Location: [facility];  Service: Urology;  Laterality: Bilateral;   CYSTOSCOPY WITH URETEROSCOPY AND STENT PLACEMENT Bilateral 08/29/2013   Procedure: CYSTOSCOPY WITH BILATERAL URETEROSCOPY AND STENT REPLACEMENTS, stone  extraction;  Surgeon: Alexis Frock, MD;  Location: [facility];  Service: Urology;  Laterality: Bilateral;   CYSTOSCOPY/RETROGRADE/URETEROSCOPY/STONE EXTRACTION WITH BASKET Bilateral 01/31/2016   Procedure: CYSTOSCOPY/BILATERAL RETROGRADE PYELOGRAMS/BILATERAL URETEROSCOPY/BILATERAL STONE EXTRACTION WITH BASKET/RIGHT URETERAL STENT PLACEMENT;  Surgeon: Raynelle Bring, MD;  Location: [facility];  Service: Urology;  Laterality: Bilateral;   HEMIARTHROPLASTY SHOULDER FRACTURE Left 1992   HOLMIUM LASER APPLICATION Bilateral 07/17/2535   Procedure: HOLMIUM LASER APPLICATION;  Surgeon: Alexis Frock, MD;  Location: [facility];  Service: Urology;  Laterality: Bilateral;   Patient Active Problem List   Diagnosis Date Noted   History of resection of meningioma 11/02/2020   Peripheral edema    Hypoalbuminemia due to protein-calorie malnutrition (HCC)    Transaminitis    Tremor    Iron deficiency anemia    CKD (chronic kidney disease), stage III ([place]) 09/10/2020   Vasogenic Cerebral edema ([place]) 09/04/2020   Cancer of skin, squamous cell 09/04/2020   Nephrolithiasis 09/04/2020   Essential hypertension 11/01/2015   Osteoporosis 10/30/2015   REFERRING PROVIDER: Duffy Rhody MD  REFERRING DIAG: Cervical spondylosis without myelopathy.  THERAPY DIAG:  Cervicalgia  Abnormal posture  Rationale for Evaluation and Treatment Rehabilitation  ONSET DATE: "2-3 years ago".  SUBJECTIVE:  SUBJECTIVE STATEMENT: Pain has had some stiffness which is normal for mornings.  PERTINENT HISTORY:  Cancer (resolved), OP, left TSA, OA. PAIN:  Are you having pain? Reports stiffness.  PRECAUTIONS: Other: OP.  WEIGHT BEARING RESTRICTIONS No   OBJECTIVE:  PATIENT SURVEYS:  FOTO  Complete.   TODAY'S TREATMENT:  05/19/22. Modalities  Date: 05/26/2022 Unattended Estim: Cervical, IFC, 10 mins, Pain and Tone Combo: Cervical, 1.5w/cm2, 100%, 10 mins, Pain and Tone  Manual Therapy Soft Tissue Mobilization: L UT/cervical paraspinals, to reduce pain and tightness     ASSESSMENT:  CLINICAL IMPRESSION: Patient presented in clinic with reports of L cervical stiffness. Patient presented with increased tightness of the L cervical paraspinals, UT region. No complaints of soreness and tenderness during manual therapy session. Normal modalities response noted following removal of the modalities. Patient states that stiffness is normal for the mornings.  GOALS: Goals reviewed with patient? Yes  SHORT TERM GOALS: Target date: 04/22/2022   Independent with HEP. Baseline:  Goal status: On-going   LONG TERM GOALS: Target date: 05/20/2022  Increase active cervical rotation to 55 degrees+ so patient can turn head more easily while driving.  Goal status: On-going  2.  Eliminate headaches. Baseline:  Goal status: On-going  3.  Perform ADL's with neck pain not > 3/10. Baseline:  Goal status: On-going     PLAN: PT FREQUENCY: 2x/week  PT DURATION: 6 weeks  PLANNED INTERVENTIONS: Therapeutic exercises, Therapeutic activity, Patient/Family education, Self Care, Dry Needling, Electrical stimulation, Cryotherapy, Moist heat, Ultrasound, and Manual therapy  PLAN FOR NEXT SESSION: Modalities and STW/M, postural exercises, chin tucks with extension, towel extensions.     Standley Brooking, PTA 06/02/2022, 11:59 AM

## 2022-06-04 DIAGNOSIS — H40033 Anatomical narrow angle, bilateral: Secondary | ICD-10-CM | POA: Diagnosis not present

## 2022-06-04 DIAGNOSIS — H2513 Age-related nuclear cataract, bilateral: Secondary | ICD-10-CM | POA: Diagnosis not present

## 2022-06-09 ENCOUNTER — Ambulatory Visit: Payer: Medicare Other | Admitting: Physical Therapy

## 2022-06-09 ENCOUNTER — Encounter: Payer: Self-pay | Admitting: Physical Therapy

## 2022-06-09 ENCOUNTER — Ambulatory Visit (INDEPENDENT_AMBULATORY_CARE_PROVIDER_SITE_OTHER): Payer: Medicare Other

## 2022-06-09 DIAGNOSIS — R293 Abnormal posture: Secondary | ICD-10-CM

## 2022-06-09 DIAGNOSIS — M542 Cervicalgia: Secondary | ICD-10-CM

## 2022-06-09 DIAGNOSIS — Z23 Encounter for immunization: Secondary | ICD-10-CM

## 2022-06-09 NOTE — Therapy (Signed)
OUTPATIENT PHYSICAL THERAPY CERVICAL TREATMENT   Patient Name: [name] Page MRN: [id number] DOB:[birth year], 71 y.o., female Today's Date: 06/09/2022   PT End of Session - 06/09/22 1157     Visit Number 16    Number of Visits 16    Date for PT Re-Evaluation 07/04/22    Authorization Type FOTO AT LEAST EVERY 5TH VISIT.  PROGRESS NOTE AT 10TH VISIT.  KX MODIFIER AFTER 15 VISITS.    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   Urgency of urination    Wears glasses    Past Surgical History:  Procedure Laterality Date   APPLICATION OF CRANIAL NAVIGATION Right 09/11/2020   Procedure: APPLICATION OF CRANIAL NAVIGATION;  Surgeon: Vallarie Mare, MD;  Location: [place];  Service: Neurosurgery;  Laterality: Right;   CRANIOTOMY Right 09/11/2020   Procedure: Right Pterional Craniotomy for resection of anterior skull base mass with Brain Lab;  Surgeon: Vallarie Mare, MD;  Location: [place];  Service: Neurosurgery;  Laterality: Right;   CYSTOSCOPY WITH RETROGRADE PYELOGRAM, URETEROSCOPY AND STENT PLACEMENT Bilateral 08/11/2013   Procedure: CYSTOSCOPY WITH RETROGRADE PYELOGRAM, URETEROSCOPY AND STENT PLACEMENT;  Surgeon: Alexis Frock, MD;  [facility];  Service: Urology;  Laterality: Bilateral;   CYSTOSCOPY WITH URETEROSCOPY AND STENT PLACEMENT Bilateral 08/29/2013   Procedure: CYSTOSCOPY WITH BILATERAL URETEROSCOPY AND STENT REPLACEMENTS, stone extraction;  Surgeon: Alexis Frock, MD;  Location:  [facility];  Service: Urology;  Laterality: Bilateral;   CYSTOSCOPY/RETROGRADE/URETEROSCOPY/STONE EXTRACTION WITH BASKET Bilateral 01/31/2016   Procedure: CYSTOSCOPY/BILATERAL RETROGRADE PYELOGRAMS/BILATERAL URETEROSCOPY/BILATERAL STONE EXTRACTION WITH BASKET/RIGHT URETERAL STENT PLACEMENT;  Surgeon: Raynelle Bring, MD;  Location[facility];  Service: Urology;  Laterality: Bilateral;   HEMIARTHROPLASTY SHOULDER FRACTURE Left 1992   HOLMIUM LASER APPLICATION Bilateral 93/81/0175   Procedure: HOLMIUM LASER APPLICATION;  Surgeon: Alexis Frock, MD;  Location: [facility];  Service: Urology;  Laterality: Bilateral;   Patient Active Problem List   Diagnosis Date Noted   History of resection of meningioma 11/02/2020   Peripheral edema    Hypoalbuminemia due to protein-calorie malnutrition (HCC)    Transaminitis    Tremor    Iron deficiency anemia    CKD (chronic kidney disease), stage III ([place]) 09/10/2020   Vasogenic Cerebral edema ([place]) 09/04/2020   Cancer of skin, squamous cell 09/04/2020   Nephrolithiasis 09/04/2020   Essential hypertension 11/01/2015   Osteoporosis 10/30/2015   REFERRING PROVIDER: Duffy Rhody MD  REFERRING DIAG: Cervical spondylosis without myelopathy.  THERAPY DIAG:  Cervicalgia  Abnormal posture  Rationale for Evaluation and Treatment Rehabilitation  ONSET DATE: "2-3 years ago".  SUBJECTIVE:  SUBJECTIVE STATEMENT: Pain very low this morning.  Cancer (resolved), OP, left TSA, OA. PAIN:  Are you having pain? Reports stiffness.  PRECAUTIONS: Other: OP.  WEIGHT BEARING RESTRICTIONS No   OBJECTIVE:  PATIENT SURVEYS:  FOTO Complete.   TODAY'S TREATMENT:  06/09/22. Modalities  Date: 06/09/2022 U/S at 3.3 mHz on 10% x 12  minutes f/b STW/M x 12 minutes with ischemic release technique utilized f/b IFC at 80-150 Hz on 40% scan x 20 minutes with HMP.     ASSESSMENT:  CLINICAL IMPRESSION: Patient very pleased with her over progress. GOALS: Goals reviewed with patient? Yes  SHORT TERM GOALS: Target date: 04/22/2022   Independent with HEP. Baseline:  Goal status: On-going   LONG TERM GOALS: Target date: 05/20/2022  Increase active cervical rotation to 55 degrees+ so patient can turn head more easily while driving.  Goal status: 45 degrees bilaterally  2.  Eliminate headaches. Baseline:  Goal status: MET  3.  Perform ADL's with neck pain not > 3/10. Baseline:  Goal status: MET     PLAN: PT FREQUENCY: 2x/week  PT DURATION: 6 weeks  PLANNED INTERVENTIONS: Therapeutic exercises, Therapeutic activity, Patient/Family education, Self Care, Dry Needling, Electrical stimulation, Cryotherapy, Moist heat, Ultrasound, and Manual therapy  PLAN FOR NEXT SESSION: Modalities and STW/M, postural exercises, chin tucks with extension, towel extensions. PHYSICAL THERAPY DISCHARGE SUMMARY  Visits from Start of Care: 16  Current functional level related to goals / functional outcomes: See above.   Remaining deficits: Excellent progress.  Continued loss of cervical range of motion but much improved.     Education / Equipment: HEP.   Patient agrees to discharge. Patient goals were partially met. Patient is being discharged due to being pleased with the current functional level.    Deztinee Lohmeyer, [place], PT 06/09/2022, 11:57 AM

## 2022-06-29 DIAGNOSIS — Z9181 History of falling: Secondary | ICD-10-CM | POA: Diagnosis not present

## 2022-06-29 DIAGNOSIS — W010XXA Fall on same level from slipping, tripping and stumbling without subsequent striking against object, initial encounter: Secondary | ICD-10-CM | POA: Diagnosis not present

## 2022-06-29 DIAGNOSIS — Y939 Activity, unspecified: Secondary | ICD-10-CM | POA: Diagnosis not present

## 2022-06-29 DIAGNOSIS — S01112A Laceration without foreign body of left eyelid and periocular area, initial encounter: Secondary | ICD-10-CM | POA: Diagnosis not present

## 2022-06-29 DIAGNOSIS — S0990XA Unspecified injury of head, initial encounter: Secondary | ICD-10-CM | POA: Diagnosis not present

## 2022-06-29 DIAGNOSIS — Z86011 Personal history of benign neoplasm of the brain: Secondary | ICD-10-CM | POA: Diagnosis not present

## 2022-06-29 DIAGNOSIS — R262 Difficulty in walking, not elsewhere classified: Secondary | ICD-10-CM | POA: Diagnosis not present

## 2022-06-29 DIAGNOSIS — R220 Localized swelling, mass and lump, head: Secondary | ICD-10-CM | POA: Diagnosis not present

## 2022-06-29 DIAGNOSIS — Y92009 Unspecified place in unspecified non-institutional (private) residence as the place of occurrence of the external cause: Secondary | ICD-10-CM | POA: Diagnosis not present

## 2022-07-01 DIAGNOSIS — Z08 Encounter for follow-up examination after completed treatment for malignant neoplasm: Secondary | ICD-10-CM | POA: Diagnosis not present

## 2022-07-01 DIAGNOSIS — L570 Actinic keratosis: Secondary | ICD-10-CM | POA: Diagnosis not present

## 2022-07-01 DIAGNOSIS — L989 Disorder of the skin and subcutaneous tissue, unspecified: Secondary | ICD-10-CM | POA: Diagnosis not present

## 2022-07-01 DIAGNOSIS — R229 Localized swelling, mass and lump, unspecified: Secondary | ICD-10-CM | POA: Diagnosis not present

## 2022-07-01 DIAGNOSIS — D485 Neoplasm of uncertain behavior of skin: Secondary | ICD-10-CM | POA: Diagnosis not present

## 2022-07-01 DIAGNOSIS — C44629 Squamous cell carcinoma of skin of left upper limb, including shoulder: Secondary | ICD-10-CM | POA: Diagnosis not present

## 2022-07-01 DIAGNOSIS — Z85828 Personal history of other malignant neoplasm of skin: Secondary | ICD-10-CM | POA: Diagnosis not present

## 2022-07-01 DIAGNOSIS — L821 Other seborrheic keratosis: Secondary | ICD-10-CM | POA: Diagnosis not present

## 2022-07-01 DIAGNOSIS — C44622 Squamous cell carcinoma of skin of right upper limb, including shoulder: Secondary | ICD-10-CM | POA: Diagnosis not present

## 2022-07-06 ENCOUNTER — Ambulatory Visit: Payer: Medicare Other | Admitting: Family Medicine

## 2022-07-07 DIAGNOSIS — D485 Neoplasm of uncertain behavior of skin: Secondary | ICD-10-CM | POA: Diagnosis not present

## 2022-08-06 DIAGNOSIS — L570 Actinic keratosis: Secondary | ICD-10-CM | POA: Diagnosis not present

## 2022-08-06 DIAGNOSIS — L989 Disorder of the skin and subcutaneous tissue, unspecified: Secondary | ICD-10-CM | POA: Diagnosis not present

## 2022-08-06 DIAGNOSIS — D0461 Carcinoma in situ of skin of right upper limb, including shoulder: Secondary | ICD-10-CM | POA: Diagnosis not present

## 2022-08-20 DIAGNOSIS — L821 Other seborrheic keratosis: Secondary | ICD-10-CM | POA: Diagnosis not present

## 2022-08-20 DIAGNOSIS — C44599 Other specified malignant neoplasm of skin of other part of trunk: Secondary | ICD-10-CM | POA: Diagnosis not present

## 2022-08-20 DIAGNOSIS — D485 Neoplasm of uncertain behavior of skin: Secondary | ICD-10-CM | POA: Diagnosis not present

## 2022-08-29 ENCOUNTER — Other Ambulatory Visit: Payer: Self-pay | Admitting: Family Medicine

## 2022-08-29 DIAGNOSIS — E782 Mixed hyperlipidemia: Secondary | ICD-10-CM

## 2022-08-31 DIAGNOSIS — L089 Local infection of the skin and subcutaneous tissue, unspecified: Secondary | ICD-10-CM | POA: Diagnosis not present

## 2022-09-22 ENCOUNTER — Other Ambulatory Visit: Payer: Self-pay | Admitting: Family Medicine

## 2022-09-22 DIAGNOSIS — E782 Mixed hyperlipidemia: Secondary | ICD-10-CM

## 2022-09-22 NOTE — Telephone Encounter (Signed)
I called pt & made an appt w/Dr G on 11-03-2022 for Med refill.

## 2022-09-22 NOTE — Telephone Encounter (Signed)
[place] NTBS 30 days given 08/30/22

## 2022-09-26 ENCOUNTER — Other Ambulatory Visit: Payer: Self-pay | Admitting: Family Medicine

## 2022-09-26 DIAGNOSIS — E782 Mixed hyperlipidemia: Secondary | ICD-10-CM

## 2022-10-01 DIAGNOSIS — C44622 Squamous cell carcinoma of skin of right upper limb, including shoulder: Secondary | ICD-10-CM | POA: Diagnosis not present

## 2022-10-01 DIAGNOSIS — D485 Neoplasm of uncertain behavior of skin: Secondary | ICD-10-CM | POA: Diagnosis not present

## 2022-10-01 DIAGNOSIS — Z85828 Personal history of other malignant neoplasm of skin: Secondary | ICD-10-CM | POA: Diagnosis not present

## 2022-10-01 DIAGNOSIS — C4442 Squamous cell carcinoma of skin of scalp and neck: Secondary | ICD-10-CM | POA: Diagnosis not present

## 2022-10-01 DIAGNOSIS — Z08 Encounter for follow-up examination after completed treatment for malignant neoplasm: Secondary | ICD-10-CM | POA: Diagnosis not present

## 2022-10-01 DIAGNOSIS — C44629 Squamous cell carcinoma of skin of left upper limb, including shoulder: Secondary | ICD-10-CM | POA: Diagnosis not present

## 2022-10-01 DIAGNOSIS — R229 Localized swelling, mass and lump, unspecified: Secondary | ICD-10-CM | POA: Diagnosis not present

## 2022-10-01 DIAGNOSIS — L821 Other seborrheic keratosis: Secondary | ICD-10-CM | POA: Diagnosis not present

## 2022-10-03 ENCOUNTER — Other Ambulatory Visit: Payer: Self-pay | Admitting: Family Medicine

## 2022-10-03 DIAGNOSIS — M810 Age-related osteoporosis without current pathological fracture: Secondary | ICD-10-CM

## 2022-10-20 DIAGNOSIS — D0461 Carcinoma in situ of skin of right upper limb, including shoulder: Secondary | ICD-10-CM | POA: Diagnosis not present

## 2022-10-22 ENCOUNTER — Other Ambulatory Visit: Payer: Self-pay | Admitting: Family Medicine

## 2022-10-22 DIAGNOSIS — E782 Mixed hyperlipidemia: Secondary | ICD-10-CM

## 2022-11-03 ENCOUNTER — Ambulatory Visit (INDEPENDENT_AMBULATORY_CARE_PROVIDER_SITE_OTHER): Payer: Medicare Other | Admitting: Family Medicine

## 2022-11-03 ENCOUNTER — Encounter: Payer: Self-pay | Admitting: Family Medicine

## 2022-11-03 VITALS — BP 118/81 | HR 65 | Temp 98.7°F | Ht 62.0 in | Wt 136.0 lb

## 2022-11-03 DIAGNOSIS — M810 Age-related osteoporosis without current pathological fracture: Secondary | ICD-10-CM

## 2022-11-03 DIAGNOSIS — E782 Mixed hyperlipidemia: Secondary | ICD-10-CM

## 2022-11-03 DIAGNOSIS — I10 Essential (primary) hypertension: Secondary | ICD-10-CM | POA: Diagnosis not present

## 2022-11-03 DIAGNOSIS — M2550 Pain in unspecified joint: Secondary | ICD-10-CM

## 2022-11-03 MED ORDER — RISEDRONATE SODIUM 150 MG PO TABS: 150.0000 mg | ORAL_TABLET | ORAL | 4 refills | Status: DC

## 2022-11-03 MED ORDER — ROSUVASTATIN CALCIUM 10 MG PO TABS: 10.0000 mg | ORAL_TABLET | Freq: Every day | ORAL | 3 refills | Status: DC

## 2022-11-03 MED ORDER — CYCLOBENZAPRINE HCL 5 MG PO TABS: 5.0000 mg | ORAL_TABLET | Freq: Three times a day (TID) | ORAL | 0 refills | Status: DC | PRN

## 2022-11-03 NOTE — Progress Notes (Signed)
Subjective: CC: Follow-up chronic conditions PCP: Janora Norlander, DO HX:[id number] [name] Page is a 72 y.o. female presenting to clinic today for:  1.  Hypertension with Hyperlipidemia Patient is compliant with Crestor 10 mg daily.  No reports of chest pain, shortness of breath, jaundice or abdominal pain.  She is not on any medications for hypertension but does monitor her blood pressures closely and they are typically 130s over 60s.  2.  Osteoporosis Patient is that she has been off of the Fosamax for a while now because she was having some jaw pain.  She wants to know if there is a once monthly alternative to this medication.  She would be amenable to that.  3.  Polyarthritis Did not feel that Celebrex was especially helpful.  She has resumed use of Advil for 100 mg roughly every other day if needed.  She only takes this once a day if she does take it it does give some relief.  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 TAKE WITH A FULL GLASS OF WATER ON AN EMPTY STOMACH., Disp: 12 tablet, Rfl: 0   Calcium Carbonate-Vit D-Min (CALCIUM 1200 PO), Take 1 tablet by mouth daily., Disp: , Rfl:    celecoxib (CELEBREX) 100 MG  capsule, TAKE 1 CAPSULE (100 MG TOTAL) BY MOUTH 2 (TWO) TIMES DAILY AS NEEDED FOR MODERATE PAIN (TAKE WITH FOOD)., Disp: 60 capsule, Rfl: 1   cyclobenzaprine (FLEXERIL) 5 MG tablet, Take 1 tablet (5 mg total) by mouth 3 (three) times daily as needed for muscle spasms., Disp: 30 tablet, Rfl: 0   escitalopram (LEXAPRO) 10 MG tablet, TAKE 1 TABLET BY MOUTH EVERY DAY (Patient not taking: Reported on 04/05/2022), Disp: 90 tablet, Rfl: 0   fluorouracil (EFUDEX) 5 % cream, Apply topically 2 (two) times daily. (Patient not taking: Reported on 01/08/2022), Disp: , Rfl:    Glycerin-Hypromellose-PEG 400 0.2-0.2-1 % SOLN, Apply 1 drop to eye 2 (two) times daily as needed (dry eyes). , Disp: , Rfl:    Multiple Vitamin (MULTIVITAMIN WITH MINERALS) TABS tablet, Take 1 tablet by mouth daily., Disp: , Rfl:    rosuvastatin (CRESTOR) 10 MG tablet, TAKE 1 TABLET BY MOUTH EVERY DAY, Disp: 90 tablet, Rfl: 0 Social History   Socioeconomic History   Marital status: Divorced    Spouse name: Not on file   Number of children: 1   Years of education: 12   Highest education level: Not on file  Occupational History   Occupation: retired  Tobacco Use   Smoking  status: Never   Smokeless tobacco: Never  Vaping Use   Vaping Use: Never used  Substance and Sexual Activity   Alcohol use: No   Drug use: No   Sexual activity: Not Currently  Other Topics Concern   Not on file  Social History Narrative   Patient resides locally.  She is currently separated from her spouse.  She is living in a mobile home near her daughter and 2 grandchildren and her father.   Social Determinants of Health   Financial Resource Strain: Low Risk  (01/06/2022)   Overall Financial Resource Strain (CARDIA)    Difficulty of Paying Living Expenses: Not hard at all  Food Insecurity: No Food Insecurity (01/06/2022)   Hunger Vital Sign    Worried About Running Out of Food in the Last Year: Never true    Ran Out of Food in the Last Year: Never true   Transportation Needs: No Transportation Needs (01/06/2022)   PRAPARE - Hydrologist (Medical): No    Lack of Transportation (Non-Medical): No  Physical Activity: Sufficiently Active (01/06/2022)   Exercise Vital Sign    Days of Exercise per Week: 5 days    Minutes of Exercise per Session: 30 min  Stress: No Stress Concern Present (01/06/2022)   [place]    Feeling of Stress : Not at all  Social Connections: Moderately Integrated (01/06/2022)   Social Connection and Isolation Panel [NHANES]    Frequency of Communication with Friends and Family: More than three times a week    Frequency of Social Gatherings with Friends and Family: More than three times a week    Attends Religious Services: More than 4 times per year    Active Member of [employer] or Organizations: Yes    Attends Archivist Meetings: More than 4 times per year    Marital Status: Separated  Intimate Partner Violence: Not At Risk (01/06/2022)   Humiliation, Afraid, Rape, and Kick questionnaire    Fear of Current or Ex-Partner: No    Emotionally Abused: No    Physically Abused: No    Sexually Abused: No   Family History  Problem Relation Age of Onset   Cancer Mother        unknown source, stomach and liver    Atrial fibrillation Father    Hyperlipidemia Father    Hypertension Father    Colon polyps Father    Hypertension Sister    Hyperlipidemia Sister    Thyroid disease Sister    Esophageal cancer Neg Hx    Pancreatic cancer Neg Hx     Objective: Office vital signs reviewed. BP (!) 173/81   Pulse 65   Temp 98.7 F (37.1 C)   Ht 5' 2"$  (1.575 m)   Wt 136 lb (61.7 kg)   SpO2 96%   BMI 24.87 kg/m   Physical Examination:  General: Awake, alert, well nourished, No acute distress HEENT: sclera white, MMM Cardio: regular rate and rhythm, S1S2 heard, no murmurs appreciated Pulm: clear to auscultation  bilaterally, no wheezes, rhonchi or rales; normal work of breathing on room air MSK: She has a higher riding left shoulder than the right.  Clear osteoarthritic changes in the neck are appreciated she has decreased range of motion  Assessment/ Plan: 72 y.o. female   Essential hypertension - Plan: CMP14+EGFR, CANCELED: CMP14+EGFR  Age-related osteoporosis without current pathological fracture - Plan: risedronate (ACTONEL) 150 MG tablet, CMP14+EGFR, VITAMIN  D 25 Hydroxy (Vit-D Deficiency, Fractures), CANCELED: CMP14+EGFR, CANCELED: VITAMIN D 25 Hydroxy (Vit-D Deficiency, Fractures)  Hyperlipemia, mixed - Plan: rosuvastatin (CRESTOR) 10 MG tablet, Lipid panel, CANCELED: LDL Cholesterol, Direct  Polyarthralgia - Plan: cyclobenzaprine (FLEXERIL) 5 MG tablet  Blood pressure not at goal.  I would like her to have a blood pressure recheck within the next couple of weeks with nurse.  If persistently elevated, will consider addition of Norvasc  She will come in for fasting labs sometime this week.  Continue statin.  This has been renewed  Check renal function, vitamin D level, calcium level for osteoporosis.  I have switched her from Fosamax to Actonel due to the jaw pain she was experiencing with Fosamax.  We also discussed potential for Prolia use if ongoing symptoms with bisphosphonates  She will follow-up with her specialist with regards to her spinal and shoulder issues.  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  She knows to follow-up with me if she in fact needs to pursue pain medications.  Hopefully we can optimize her with injection therapy alone  No orders of the defined types were placed in this encounter.  No orders of the defined types were placed in this encounter.    Janora Norlander, DO [place] [phone]

## 2022-11-05 DIAGNOSIS — D0461 Carcinoma in situ of skin of right upper limb, including shoulder: Secondary | ICD-10-CM | POA: Diagnosis not present

## 2022-11-10 ENCOUNTER — Other Ambulatory Visit: Payer: Medicare Other

## 2022-11-10 DIAGNOSIS — M810 Age-related osteoporosis without current pathological fracture: Secondary | ICD-10-CM | POA: Diagnosis not present

## 2022-11-10 DIAGNOSIS — I10 Essential (primary) hypertension: Secondary | ICD-10-CM

## 2022-11-10 DIAGNOSIS — E782 Mixed hyperlipidemia: Secondary | ICD-10-CM | POA: Diagnosis not present

## 2022-11-11 LAB — CMP14+EGFR
ALT: 14 IU/L (ref 0–32)
AST: 24 IU/L (ref 0–40)
Albumin/Globulin Ratio: 1.7 (ref 1.2–2.2)
Albumin: 4.5 g/dL (ref 3.8–4.8)
Alkaline Phosphatase: 110 IU/L (ref 44–121)
BUN/Creatinine Ratio: 26 (ref 12–28)
BUN: 23 mg/dL (ref 8–27)
Bilirubin Total: 0.4 mg/dL (ref 0.0–1.2)
CO2: 24 mmol/L (ref 20–29)
Calcium: 9.4 mg/dL (ref 8.7–10.3)
Chloride: 106 mmol/L (ref 96–106)
Creatinine, Ser: 0.88 mg/dL (ref 0.57–1.00)
Globulin, Total: 2.6 g/dL (ref 1.5–4.5)
Glucose: 93 mg/dL (ref 70–99)
Potassium: 4.4 mmol/L (ref 3.5–5.2)
Sodium: 141 mmol/L (ref 134–144)
Total Protein: 7.1 g/dL (ref 6.0–8.5)
eGFR: 70 mL/min/{1.73_m2} (ref >59)

## 2022-11-11 LAB — LIPID PANEL
Chol/HDL Ratio: 2.1 ratio (ref 0.0–4.4)
Cholesterol, Total: 146 mg/dL (ref 100–199)
HDL: 70 mg/dL (ref >39)
LDL Chol Calc (NIH): 61 mg/dL (ref 0–99)
Triglycerides: 76 mg/dL (ref 0–149)
VLDL Cholesterol Cal: 15 mg/dL (ref 5–40)

## 2022-11-11 LAB — VITAMIN D 25 HYDROXY (VIT D DEFICIENCY, FRACTURES): Vit D, 25-Hydroxy: 34.6 ng/mL (ref 30.0–100.0)

## 2022-11-18 ENCOUNTER — Encounter: Payer: Self-pay | Admitting: Radiology

## 2022-11-18 DIAGNOSIS — D044 Carcinoma in situ of skin of scalp and neck: Secondary | ICD-10-CM | POA: Diagnosis not present

## 2022-12-30 DIAGNOSIS — N281 Cyst of kidney, acquired: Secondary | ICD-10-CM | POA: Diagnosis not present

## 2022-12-30 DIAGNOSIS — N200 Calculus of kidney: Secondary | ICD-10-CM | POA: Diagnosis not present

## 2023-01-06 DIAGNOSIS — C4359 Malignant melanoma of other part of trunk: Secondary | ICD-10-CM | POA: Diagnosis not present

## 2023-01-06 DIAGNOSIS — Z85828 Personal history of other malignant neoplasm of skin: Secondary | ICD-10-CM | POA: Diagnosis not present

## 2023-01-06 DIAGNOSIS — C4442 Squamous cell carcinoma of skin of scalp and neck: Secondary | ICD-10-CM | POA: Diagnosis not present

## 2023-01-06 DIAGNOSIS — C44712 Basal cell carcinoma of skin of right lower limb, including hip: Secondary | ICD-10-CM | POA: Diagnosis not present

## 2023-01-06 DIAGNOSIS — L578 Other skin changes due to chronic exposure to nonionizing radiation: Secondary | ICD-10-CM | POA: Diagnosis not present

## 2023-01-06 DIAGNOSIS — L244 Irritant contact dermatitis due to drugs in contact with skin: Secondary | ICD-10-CM | POA: Diagnosis not present

## 2023-01-06 DIAGNOSIS — Z85820 Personal history of malignant melanoma of skin: Secondary | ICD-10-CM | POA: Diagnosis not present

## 2023-01-06 DIAGNOSIS — Z08 Encounter for follow-up examination after completed treatment for malignant neoplasm: Secondary | ICD-10-CM | POA: Diagnosis not present

## 2023-01-06 DIAGNOSIS — R229 Localized swelling, mass and lump, unspecified: Secondary | ICD-10-CM | POA: Diagnosis not present

## 2023-01-06 DIAGNOSIS — D485 Neoplasm of uncertain behavior of skin: Secondary | ICD-10-CM | POA: Diagnosis not present

## 2023-01-06 DIAGNOSIS — L821 Other seborrheic keratosis: Secondary | ICD-10-CM | POA: Diagnosis not present

## 2023-01-06 DIAGNOSIS — C44519 Basal cell carcinoma of skin of other part of trunk: Secondary | ICD-10-CM | POA: Diagnosis not present

## 2023-01-07 ENCOUNTER — Encounter: Payer: Self-pay | Admitting: Family

## 2023-01-07 ENCOUNTER — Telehealth (INDEPENDENT_AMBULATORY_CARE_PROVIDER_SITE_OTHER): Payer: Medicare Other | Admitting: Family

## 2023-01-07 DIAGNOSIS — J208 Acute bronchitis due to other specified organisms: Secondary | ICD-10-CM

## 2023-01-07 DIAGNOSIS — B9689 Other specified bacterial agents as the cause of diseases classified elsewhere: Secondary | ICD-10-CM

## 2023-01-07 MED ORDER — BENZONATATE 200 MG PO CAPS
200.0000 mg | ORAL_CAPSULE | Freq: Three times a day (TID) | ORAL | 1 refills | Status: DC | PRN
Start: 2023-01-07 — End: 2023-01-28

## 2023-01-07 MED ORDER — DOXYCYCLINE HYCLATE 100 MG PO TABS: 100.0000 mg | ORAL_TABLET | Freq: Two times a day (BID) | ORAL | 0 refills | Status: DC

## 2023-01-07 NOTE — Progress Notes (Signed)
Virtual Visit Consent   [name] Page, you are scheduled for a virtual visit with a [facility] provider today. Just as with appointments in the office, your consent must be obtained to participate. Your consent will be active for this visit and any virtual visit you may have with one of our providers in the next 365 days. If you have a MyChart account, a copy of this consent can be sent to you electronically.  As this is a virtual visit, video technology does not allow for your provider to perform a traditional examination. This may limit your provider's ability to fully assess your condition. If your provider identifies any concerns that need to be evaluated in person or the need to arrange testing (such as labs, EKG, etc.), we will make arrangements to do so. Although advances in technology are sophisticated, we cannot ensure that it will always work on either your end or our end. If the connection with a video visit is poor, the visit may have to be switched to a telephone visit. With either a video or telephone visit, we are not always able to ensure that we have a secure connection.  By engaging in this virtual visit, you consent to the provision of healthcare and authorize for your insurance to be billed (if applicable) for the services provided during this visit. Depending on your insurance coverage, you may receive a charge related to this service.  I need to obtain your verbal consent now. Are you willing to proceed with your visit today? [name] Page has provided verbal consent on 01/07/2023 for a virtual visit (video or telephone). Jannifer Rodney, FNP  Date: 01/07/2023 4:19 PM  Virtual Visit via Video Note   I, Jannifer Rodney, connected with  [name] Page  ([id number], Jan 14, 1951) on 01/07/23 at 11:05 AM EDT by a video-enabled telemedicine application and verified that I am speaking with the correct person using two identifiers.  Location: Patient: Virtual Visit Location Patient:  Home Provider: Virtual Visit Location Provider: Office/Clinic   I discussed the limitations of evaluation and management by telemedicine and the availability of in person appointments. The patient expressed understanding and agreed to proceed.    History of Present Illness: [name] Page is a 72 y.o. who identifies as a female who was assigned female at birth, and is being seen today for cough that started several days ago that has worsen.  HPI: Cough This is a new problem. The current episode started 1 to 4 weeks ago. The problem has been gradually worsening. The problem occurs every few minutes. The cough is Non-productive. Associated symptoms include headaches. Pertinent negatives include no chills, ear congestion, ear pain, fever, myalgias, shortness of breath or wheezing. She has tried rest (claritin) for the symptoms. The treatment provided mild relief.    Problems:  Patient Active Problem List   Diagnosis Date Noted   History of resection of meningioma 11/02/2020   Peripheral edema    Hypoalbuminemia due to protein-calorie malnutrition    Transaminitis    Tremor    Iron deficiency anemia    CKD (chronic kidney disease), stage III 09/10/2020   Vasogenic Cerebral edema (HCC) 09/04/2020   Cancer of skin, squamous cell 09/04/2020   Nephrolithiasis 09/04/2020   Essential hypertension 11/01/2015   Osteoporosis 10/30/2015    Allergies:  Allergies  Allergen Reactions   Sulfa Antibiotics Rash   Medications:  Current Outpatient Medications:    acetaminophen (TYLENOL) 500 MG tablet, Take 500 mg by mouth every  6 (six) hours as needed., Disp: , Rfl:    benzonatate (TESSALON) 200 MG capsule, Take 1 capsule (200 mg total) by mouth 3 (three) times daily as needed., Disp: 30 capsule, Rfl: 1   Calcium Carbonate-Vit D-Min (CALCIUM 1200 PO), Take 1 tablet by mouth daily., Disp: , Rfl:    cyclobenzaprine (FLEXERIL) 5 MG tablet, Take 1 tablet (5 mg total) by mouth 3 (three) times daily as  needed for muscle spasms., Disp: 30 tablet, Rfl: 0   doxycycline (VIBRA-TABS) 100 MG tablet, Take 1 tablet (100 mg total) by mouth 2 (two) times daily., Disp: 20 tablet, Rfl: 0   Glycerin-Hypromellose-PEG 400 0.2-0.2-1 % SOLN, Apply 1 drop to eye 2 (two) times daily as needed (dry eyes). , Disp: , Rfl:    ibuprofen (ADVIL) 400 MG tablet, Take 400 mg by mouth every other day as needed for moderate pain., Disp: , Rfl:    Multiple Vitamin (MULTIVITAMIN WITH MINERALS) TABS tablet, Take 1 tablet by mouth daily., Disp: , Rfl:    risedronate (ACTONEL) 150 MG tablet, Take 1 tablet (150 mg total) by mouth every 30 (thirty) days. with water on empty stomach, nothing by mouth or lie down for next 30 minutes., Disp: 3 tablet, Rfl: 4   rosuvastatin (CRESTOR) 10 MG tablet, Take 1 tablet (10 mg total) by mouth daily., Disp: 90 tablet, Rfl: 3  Observations/Objective: Patient is well-developed, well-nourished in no acute distress.  Resting comfortably at home.  Head is normocephalic, atraumatic.  No labored breathing.  Speech is clear and coherent with logical content.  Patient is alert and oriented at baseline.  Nonproductive cough  Assessment and Plan: 1. Acute bacterial bronchitis - doxycycline (VIBRA-TABS) 100 MG tablet; Take 1 tablet (100 mg total) by mouth 2 (two) times daily.  Dispense: 20 tablet; Refill: 0 - benzonatate (TESSALON) 200 MG capsule; Take 1 capsule (200 mg total) by mouth 3 (three) times daily as needed.  Dispense: 30 capsule; Refill: 1  - Take meds as prescribed - Use a cool mist humidifier  -Use saline nose sprays frequently -Force fluids -For any cough or congestion  Use plain Mucinex- regular strength or max strength is fine -For fever or aces or pains- take tylenol or ibuprofen. -Throat lozenges if help -Follow up if symptoms worsen or do not improve   Follow Up Instructions: I discussed the assessment and treatment plan with the patient. The patient was provided an  opportunity to ask questions and all were answered. The patient agreed with the plan and demonstrated an understanding of the instructions.  A copy of instructions were sent to the patient via MyChart unless otherwise noted below.    The patient was advised to call back or seek an in-person evaluation if the symptoms worsen or if the condition fails to improve as anticipated.  Time:  I spent 11 minutes with the patient via telehealth technology discussing the above problems/concerns.    Jannifer Rodney, FNP

## 2023-01-10 ENCOUNTER — Ambulatory Visit (INDEPENDENT_AMBULATORY_CARE_PROVIDER_SITE_OTHER): Payer: Medicare Other

## 2023-01-10 VITALS — Ht 62.0 in | Wt 136.0 lb

## 2023-01-10 DIAGNOSIS — Z0000 Encounter for general adult medical examination without abnormal findings: Secondary | ICD-10-CM | POA: Diagnosis not present

## 2023-01-10 NOTE — Progress Notes (Signed)
Subjective:   [name] Page is a 72 y.o. female who presents for Medicare Annual (Subsequent) preventive examination.  I connected with  Dyann Kief on 01/10/23 by a audio enabled telemedicine application and verified that I am speaking with the correct person using two identifiers.  Patient Location: Home  Provider Location: Home Office  I discussed the limitations of evaluation and management by telemedicine. The patient expressed understanding and agreed to proceed.  Review of Systems     Cardiac Risk Factors include: advanced age (>57men, >83 women);hypertension;dyslipidemia     Objective:    Today's Vitals   01/10/23 1350  Weight: 136 lb (61.7 kg)  Height:  (1.575 m)   Body mass index is 24.87 kg/m.     01/10/2023    1:56 PM 04/05/2022   12:36 PM 01/06/2022    3:41 PM 06/25/2021    5:19 PM 10/01/2020    2:33 PM 09/17/2020    4:14 PM 09/17/2020    3:50 PM  Advanced Directives  Does Patient Have a Medical Advance Directive? Yes Yes Yes Yes Yes Yes Yes  Type of Advance Directive Living will;Healthcare Power of [employer] Power of [place];Living will   Living will Living will;Healthcare Power of Attorney  Does patient want to make changes to medical advance directive? No - Patient declined     No - Patient declined   Copy of Healthcare Power of Attorney in Chart? Yes - validated most recent copy scanned in chart (See row information)  Yes - validated most recent copy scanned in chart (See row information)    No - copy requested    Current Medications (verified) Outpatient Encounter Medications as of 01/10/2023  Medication Sig   acetaminophen (TYLENOL) 500 MG tablet Take 500 mg by mouth every 6 (six) hours as needed.   benzonatate (TESSALON) 200 MG capsule Take 1 capsule (200 mg total) by mouth 3 (three) times daily as needed.   Calcium Carbonate-Vit D-Min (CALCIUM 1200 PO) Take 1 tablet by mouth daily.   cyclobenzaprine (FLEXERIL) 5 MG tablet Take 1  tablet (5 mg total) by mouth 3 (three) times daily as needed for muscle spasms.   doxycycline (VIBRA-TABS) 100 MG tablet Take 1 tablet (100 mg total) by mouth 2 (two) times daily.   Glycerin-Hypromellose-PEG 400 0.2-0.2-1 % SOLN Apply 1 drop to eye 2 (two) times daily as needed (dry eyes).    ibuprofen (ADVIL) 400 MG tablet Take 400 mg by mouth every other day as needed for moderate pain.   Multiple Vitamin (MULTIVITAMIN WITH MINERALS) TABS tablet Take 1 tablet by mouth daily.   risedronate (ACTONEL) 150 MG tablet Take 1 tablet (150 mg total) by mouth every 30 (thirty) days. with water on empty stomach, nothing by mouth or lie down for next 30 minutes.   rosuvastatin (CRESTOR) 10 MG tablet Take 1 tablet (10 mg total) by mouth daily.   No facility-administered encounter medications on file as of 01/10/2023.    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   Urgency of urination    Wears glasses    Past Surgical History:  Procedure Laterality Date   APPLICATION OF CRANIAL NAVIGATION Right 09/11/2020   Procedure: APPLICATION OF CRANIAL NAVIGATION;  Surgeon: Bedelia Person, MD;  Location: [facility] OR;  Service: Neurosurgery;  Laterality: Right;   CRANIOTOMY Right 09/11/2020   Procedure: Right Pterional Craniotomy for resection of anterior skull base mass with Brain Lab;  Surgeon: Bedelia Person, MD;  Location: [facility] OR;  Service: Neurosurgery;  Laterality: Right;   CYSTOSCOPY WITH RETROGRADE PYELOGRAM, URETEROSCOPY AND STENT PLACEMENT Bilateral 08/11/2013   Procedure: CYSTOSCOPY WITH RETROGRADE PYELOGRAM, URETEROSCOPY AND  STENT PLACEMENT;  Surgeon: Sebastian Ache, MD;  Location: [facility];  Service: Urology;  Laterality: Bilateral;   CYSTOSCOPY WITH URETEROSCOPY AND STENT PLACEMENT Bilateral 08/29/2013   Procedure: CYSTOSCOPY WITH BILATERAL URETEROSCOPY AND STENT REPLACEMENTS, stone extraction;  Surgeon: Sebastian Ache, MD;  Location: [facility];  Service: Urology;  Laterality: Bilateral;   CYSTOSCOPY/RETROGRADE/URETEROSCOPY/STONE EXTRACTION WITH BASKET Bilateral 01/31/2016   Procedure: CYSTOSCOPY/BILATERAL RETROGRADE PYELOGRAMS/BILATERAL URETEROSCOPY/BILATERAL STONE EXTRACTION WITH BASKET/RIGHT URETERAL STENT PLACEMENT;  Surgeon: Heloise Purpura, MD;  Location: [facility];  Service: Urology;  Laterality: Bilateral;   HEMIARTHROPLASTY SHOULDER FRACTURE Left 1992   HOLMIUM LASER APPLICATION Bilateral 08/29/2013   Procedure: HOLMIUM LASER APPLICATION;  Surgeon: Sebastian Ache, MD;  Location: [facility];  Service: Urology;  Laterality: Bilateral;   Family History  Problem Relation Age of Onset   Cancer Mother        unknown source, stomach and liver    Atrial fibrillation Father    Hyperlipidemia Father    Hypertension Father    Colon polyps Father    Hypertension Sister    Hyperlipidemia Sister    Thyroid disease Sister    Esophageal cancer Neg Hx    Pancreatic cancer Neg Hx    Social History   Socioeconomic History   Marital status: Divorced    Spouse name: Not on file   Number of children: 1   Years of education: 12   Highest education level: Not on file  Occupational History   Occupation: retired  Tobacco Use   Smoking status: Never   Smokeless tobacco: Never  Vaping Use   Vaping Use: Never used  Substance and Sexual Activity   Alcohol use: No   Drug use: No   Sexual activity: Not Currently  Other Topics Concern   Not on file  Social History Narrative   Patient resides locally.  She is currently separated from her spouse.  She is living in a mobile home near her  daughter and 2 grandchildren and her father.   Social Determinants of Health   Financial Resource Strain: Low Risk  (01/10/2023)   Overall Financial Resource Strain (CARDIA)    Difficulty of Paying Living Expenses: Not hard at all  Food Insecurity: No Food Insecurity (01/10/2023)   Hunger Vital Sign    Worried About Running Out of Food in the Last Year: Never true    Ran Out of Food in the Last Year: Never true  Transportation Needs: No Transportation Needs (01/10/2023)   PRAPARE - Administrator, Civil Service (Medical): No    Lack of Transportation (Non-Medical): No  Physical Activity: Sufficiently Active (01/10/2023)   Exercise Vital Sign    Days of Exercise per Week: 5 days    Minutes of Exercise per Session: 30 min  Stress: No Stress Concern Present (01/10/2023)  [employer] of Occupational Health - Occupational Stress Questionnaire    Feeling of Stress : Only a little  Social Connections: Moderately Integrated (01/10/2023)   Social Connection and Isolation Panel [NHANES]    Frequency of Communication with Friends and Family: Three times a week    Frequency of Social Gatherings with Friends and Family: Three times a week    Attends Religious Services: More than 4 times per year    Active Member of Clubs or Organizations: Yes    Attends Banker Meetings: 1 to 4 times per year    Marital Status: Divorced    Tobacco Counseling Counseling given: Not Answered   Clinical Intake:  Pre-visit preparation completed: Yes  Pain : No/denies pain  Diabetes: No  How often do you need to have someone help you when you read instructions, pamphlets, or other written materials from your doctor or pharmacy?: 3 - Sometimes  Diabetic?No   Interpreter Needed?: No  Information entered by :: Kandis Fantasia LPN   Activities of Daily Living    01/10/2023    1:56 PM 01/06/2023    8:47 AM  In your present state of health, do you have any difficulty performing  the following activities:  Hearing? 0 0  Vision? 0 0  Difficulty concentrating or making decisions? 0 0  Walking or climbing stairs? 1 1  Dressing or bathing? 0 0  Doing errands, shopping? 0 0  Preparing Food and eating ? N N  Using the Toilet? N N  In the past six months, have you accidently leaked urine? N N  Do you have problems with loss of bowel control? N N  Managing your Medications? N N  Managing your Finances? N N  Housekeeping or managing your Housekeeping? N N    Patient Care Team: Raliegh Ip, DO as PCP - General (Family Medicine) Berneice Heinrich Delbert Phenix., MD as Consulting Physician (Urology) Glennis Brink, MD as Referring Physician (Dermatology) Moody Bruins as Physician Assistant (Chiropractic Medicine) Michaelle Copas, MD as Referring Physician (Optometry)  Indicate any recent Medical Services you may have received from other than [facility] providers in the past year (date may be approximate).     Assessment:   This is a routine wellness examination for Jentri.  Hearing/Vision screen Hearing Screening - Comments:: Denies hearing difficulties   Vision Screening - Comments:: Wears rx glasses - up to date with routine eye exams with [facility]     Dietary issues and exercise activities discussed: Current Exercise Habits: Home exercise routine, Type of exercise: walking;stretching, Time (Minutes): 30, Frequency (Times/Week): 5, Weekly Exercise (Minutes/Week): 150, Intensity: Mild   Goals Addressed   None   Depression Screen    01/10/2023    1:54 PM 11/03/2022    3:34 PM 01/08/2022    1:13 PM 01/06/2022    3:34 PM 07/20/2021   10:15 AM 05/06/2021    3:32 PM 04/13/2021   10:22 AM  PHQ 2/9 Scores  PHQ - 2 Score 0 0 0 0 0 0 0  PHQ- 9 Score  0    3 3    Fall Risk    01/10/2023    1:55 PM 01/06/2023    8:47 AM 11/03/2022    3:34 PM 01/08/2022    1:13 PM 01/06/2022    3:31 PM  Fall Risk   Falls in the past year? 1 1 0 0 0  Number falls in past  yr: 0 0 0  0  Injury with Fall? 1 1 0  0  Risk for fall due to : Impaired balance/gait;Impaired mobility  No Fall Risks  Impaired balance/gait  Follow up Falls prevention discussed;Education provided;Falls evaluation completed  Education provided  Falls prevention discussed    FALL RISK PREVENTION PERTAINING TO THE HOME:  Any stairs in or around the home? No  If so, are there any without handrails? No  Home free of loose throw rugs in walkways, pet beds, electrical cords, etc? Yes  Adequate lighting in your home to reduce risk of falls? Yes   ASSISTIVE DEVICES UTILIZED TO PREVENT FALLS:  Life alert? No  Use of a cane, walker or w/c? No  Grab bars in the bathroom? Yes  Shower chair or bench in shower? No  Elevated toilet seat or a handicapped toilet? Yes   TIMED UP AND GO:  Was the test performed? No . Telephonic visit   Cognitive Function:    08/23/2018    9:28 AM  MMSE - Mini Mental State Exam  Orientation to time 5  Orientation to Place 5  Registration 3  Attention/ Calculation 5  Recall 2  Language- name 2 objects 2  Language- repeat 1  Language- follow 3 step command 3  Language- read & follow direction 1  Write a sentence 1  Copy design 1  Total score 29        01/10/2023    1:56 PM 01/06/2022    3:35 PM 09/11/2019   10:55 AM  6CIT Screen  What Year? 0 points 0 points 0 points  What month? 0 points 0 points 0 points  What time? 0 points 0 points 0 points  Count back from 20 0 points 0 points 0 points  Months in reverse 0 points 2 points 0 points  Repeat phrase 0 points 0 points 0 points  Total Score 0 points 2 points 0 points    Immunizations Immunization History  Administered Date(s) Administered   Fluad Quad(high Dose 65+) 06/27/2019, 07/18/2020, 07/20/2021, 06/09/2022   Hepatitis B 04/21/1994   Influenza, High Dose Seasonal PF 08/08/2015, 07/07/2018   Influenza,inj,Quad PF,6+ Mos 08/11/2013   Influenza-Unspecified 06/05/2014   Moderna SARS-COV2  Booster Vaccination 09/16/2020, 09/16/2020   Moderna Sars-Covid-2 Vaccination 10/13/2019, 11/15/2019   Pneumococcal Conjugate-13 10/30/2015   Pneumococcal Polysaccharide-23 08/14/2018    TDAP status: Due, Education has been provided regarding the importance of this vaccine. Advised may receive this vaccine at local pharmacy or Health Dept. Aware to provide a copy of the vaccination record if obtained from local pharmacy or Health Dept. Verbalized acceptance and understanding.  Flu Vaccine status: Up to date  Pneumococcal vaccine status: Up to date  Covid-19 vaccine status: Information provided on how to obtain vaccines.   Qualifies for Shingles Vaccine? Yes   Zostavax completed No   Shingrix Completed?: No.    Education has been provided regarding the importance of this vaccine. Patient has been advised to call insurance company to determine out of pocket expense if they have not yet received this vaccine. Advised may also receive vaccine at local pharmacy or Health Dept. Verbalized acceptance and understanding.  Screening Tests Health Maintenance  Topic Date Due   Hepatitis C Screening  Never done   DTaP/Tdap/Td (1 - Tdap) Never done   COVID-19 Vaccine (3 - Moderna risk series) 10/14/2020   Zoster Vaccines- Shingrix (1 of 2) 02/01/2023 (Originally 01/31/1970)   COLONOSCOPY (Pts 45-26yrs Insurance coverage will need to be confirmed)  11/04/2023 (Originally 09/21/2019)  INFLUENZA VACCINE  04/21/2023   Medicare Annual Wellness (AWV)  01/10/2024   DEXA SCAN  01/21/2024   MAMMOGRAM  02/18/2024   Pneumonia Vaccine 62+ Years old  Completed   HPV VACCINES  Aged Out    Health Maintenance  Health Maintenance Due  Topic Date Due   Hepatitis C Screening  Never done   DTaP/Tdap/Td (1 - Tdap) Never done   COVID-19 Vaccine (3 - Moderna risk series) 10/14/2020    Colorectal cancer status:  Patient declines at this time   Mammogram status: Completed 02/17/22. Repeat every year  Bone  Density status: Completed 01/20/22. Results reflect: Bone density results: OSTEOPOROSIS. Repeat every 2 years.  Lung Cancer Screening: (Low Dose CT Chest recommended if Age 54-80 years, 30 pack-year currently smoking OR have quit w/in 15years.) does not qualify.   Lung Cancer Screening Referral: n/a  Additional Screening:  Hepatitis C Screening: does qualify;  Vision Screening: Recommended annual ophthalmology exams for early detection of glaucoma and other disorders of the eye. Is the patient up to date with their annual eye exam?  Yes  Who is the provider or what is the name of the office in which the patient attends annual eye exams? [facility]) If pt is not established with a provider, would they like to be referred to a provider to establish care? No .   Dental Screening: Recommended annual dental exams for proper oral hygiene  Community Resource Referral / Chronic Care Management: CRR required this visit?  No   CCM required this visit?  No      Plan:     I have personally reviewed and noted the following in the patient's chart:   Medical and social history Use of alcohol, tobacco or illicit drugs  Current medications and supplements including opioid prescriptions. Patient is not currently taking opioid prescriptions. Functional ability and status Nutritional status Physical activity Advanced directives List of other physicians Hospitalizations, surgeries, and ER visits in previous 12 months Vitals Screenings to include cognitive, depression, and falls Referrals and appointments  In addition, I have reviewed and discussed with patient certain preventive protocols, quality metrics, and best practice recommendations. A written personalized care plan for preventive services as well as general preventive health recommendations were provided to patient.     Durwin Nora, [place]   1/61/0960   Due to this being a virtual visit, the after visit  summary with patients personalized plan was offered to patient via mail or my-chart. Patient would like to access on my-chart  Nurse Notes: No concerns

## 2023-01-10 NOTE — Patient Instructions (Signed)
[name] Page , Thank you for taking time to come for your Medicare Wellness Visit. I appreciate your ongoing commitment to your health goals. Please review the following plan we discussed and let me know if I can assist you in the future.   These are the goals we discussed:  Goals      Increase physical activity     LIFESTYLE - DECREASE FALLS RISK        This is a list of the screening recommended for you and due dates:  Health Maintenance  Topic Date Due   Hepatitis C Screening: USPSTF Recommendation to screen - Ages 47-79 yo.  Never done   DTaP/Tdap/Td vaccine (1 - Tdap) Never done   COVID-19 Vaccine (3 - Moderna risk series) 10/14/2020   Zoster (Shingles) Vaccine (1 of 2) 02/01/2023*   Colon Cancer Screening  11/04/2023*   Flu Shot  04/21/2023   Medicare Annual Wellness Visit  01/10/2024   DEXA scan (bone density measurement)  01/21/2024   Mammogram  02/18/2024   Pneumonia Vaccine  Completed   HPV Vaccine  Aged Out  *Topic was postponed. The date shown is not the original due date.    Advanced directives: We have a copy of your advanced directives available in your record should your provider ever need to access them.  Conditions/risks identified: Aim for 30 minutes of exercise or brisk walking, 6-8 glasses of water, and 5 servings of fruits and vegetables each day.   Next appointment: Follow up in one year for your annual wellness visit    Preventive Care 65 Years and Older, Female Preventive care refers to lifestyle choices and visits with your health care provider that can promote health and wellness. What does preventive care include? A yearly physical exam. This is also called an annual well check. Dental exams once or twice a year. Routine eye exams. Ask your health care provider how often you should have your eyes checked. Personal lifestyle choices, including: Daily care of your teeth and gums. Regular physical activity. Eating a healthy diet. Avoiding tobacco and  drug use. Limiting alcohol use. Practicing safe sex. Taking low-dose aspirin every day. Taking vitamin and mineral supplements as recommended by your health care provider. What happens during an annual well check? The services and screenings done by your health care provider during your annual well check will depend on your age, overall health, lifestyle risk factors, and family history of disease. Counseling  Your health care provider may ask you questions about your: Alcohol use. Tobacco use. Drug use. Emotional well-being. Home and relationship well-being. Sexual activity. Eating habits. History of falls. Memory and ability to understand (cognition). Work and work Astronomer. Reproductive health. Screening  You may have the following tests or measurements: Height, weight, and BMI. Blood pressure. Lipid and cholesterol levels. These may be checked every 5 years, or more frequently if you are over 47 years old. Skin check. Lung cancer screening. You may have this screening every year starting at age 21 if you have a 30-pack-year history of smoking and currently smoke or have quit within the past 15 years. Fecal occult blood test (FOBT) of the stool. You may have this test every year starting at age 95. Flexible sigmoidoscopy or colonoscopy. You may have a sigmoidoscopy every 5 years or a colonoscopy every 10 years starting at age 3. Hepatitis C blood test. Hepatitis B blood test. Sexually transmitted disease (STD) testing. Diabetes screening. This is done by checking your blood sugar (glucose) after  you have not eaten for a while (fasting). You may have this done every 1-3 years. Bone density scan. This is done to screen for osteoporosis. You may have this done starting at age 24. Mammogram. This may be done every 1-2 years. Talk to your health care provider about how often you should have regular mammograms. Talk with your health care provider about your test results, treatment  options, and if necessary, the need for more tests. Vaccines  Your health care provider may recommend certain vaccines, such as: Influenza vaccine. This is recommended every year. Tetanus, diphtheria, and acellular pertussis (Tdap, Td) vaccine. You may need a Td booster every 10 years. Zoster vaccine. You may need this after age 55. Pneumococcal 13-valent conjugate (PCV13) vaccine. One dose is recommended after age 50. Pneumococcal polysaccharide (PPSV23) vaccine. One dose is recommended after age 27. Talk to your health care provider about which screenings and vaccines you need and how often you need them. This information is not intended to replace advice given to you by your health care provider. Make sure you discuss any questions you have with your health care provider. Document Released: 10/03/2015 Document Revised: 05/26/2016 Document Reviewed: 07/08/2015 Elsevier Interactive Patient Education  2017 [place] Prevention in the Home Falls can cause injuries. They can happen to people of all ages. There are many things you can do to make your home safe and to help prevent falls. What can I do on the outside of my home? Regularly fix the edges of walkways and driveways and fix any cracks. Remove anything that might make you trip as you walk through a door, such as a raised step or threshold. Trim any bushes or trees on the path to your home. Use bright outdoor lighting. Clear any walking paths of anything that might make someone trip, such as rocks or tools. Regularly check to see if handrails are loose or broken. Make sure that both sides of any steps have handrails. Any raised decks and porches should have guardrails on the edges. Have any leaves, snow, or ice cleared regularly. Use sand or salt on walking paths during winter. Clean up any spills in your garage right away. This includes oil or grease spills. What can I do in the bathroom? Use night lights. Install grab  bars by the toilet and in the tub and shower. Do not use towel bars as grab bars. Use non-skid mats or decals in the tub or shower. If you need to sit down in the shower, use a plastic, non-slip stool. Keep the floor dry. Clean up any water that spills on the floor as soon as it happens. Remove soap buildup in the tub or shower regularly. Attach bath mats securely with double-sided non-slip rug tape. Do not have throw rugs and other things on the floor that can make you trip. What can I do in the bedroom? Use night lights. Make sure that you have a light by your bed that is easy to reach. Do not use any sheets or blankets that are too big for your bed. They should not hang down onto the floor. Have a firm chair that has side arms. You can use this for support while you get dressed. Do not have throw rugs and other things on the floor that can make you trip. What can I do in the kitchen? Clean up any spills right away. Avoid walking on wet floors. Keep items that you use a lot in easy-to-reach places. If you  need to reach something above you, use a strong step stool that has a grab bar. Keep electrical cords out of the way. Do not use floor polish or wax that makes floors slippery. If you must use wax, use non-skid floor wax. Do not have throw rugs and other things on the floor that can make you trip. What can I do with my stairs? Do not leave any items on the stairs. Make sure that there are handrails on both sides of the stairs and use them. Fix handrails that are broken or loose. Make sure that handrails are as long as the stairways. Check any carpeting to make sure that it is firmly attached to the stairs. Fix any carpet that is loose or worn. Avoid having throw rugs at the top or bottom of the stairs. If you do have throw rugs, attach them to the floor with carpet tape. Make sure that you have a light switch at the top of the stairs and the bottom of the stairs. If you do not have them,  ask someone to add them for you. What else can I do to help prevent falls? Wear shoes that: Do not have high heels. Have rubber bottoms. Are comfortable and fit you well. Are closed at the toe. Do not wear sandals. If you use a stepladder: Make sure that it is fully opened. Do not climb a closed stepladder. Make sure that both sides of the stepladder are locked into place. Ask someone to hold it for you, if possible. Clearly mark and make sure that you can see: Any grab bars or handrails. First and last steps. Where the edge of each step is. Use tools that help you move around (mobility aids) if they are needed. These include: Canes. Walkers. Scooters. Crutches. Turn on the lights when you go into a dark area. Replace any light bulbs as soon as they burn out. Set up your furniture so you have a clear path. Avoid moving your furniture around. If any of your floors are uneven, fix them. If there are any pets around you, be aware of where they are. Review your medicines with your doctor. Some medicines can make you feel dizzy. This can increase your chance of falling. Ask your doctor what other things that you can do to help prevent falls. This information is not intended to replace advice given to you by your health care provider. Make sure you discuss any questions you have with your health care provider. Document Released: 07/03/2009 Document Revised: 02/12/2016 Document Reviewed: 10/11/2014 Elsevier Interactive Patient Education  2017 [employer].

## 2023-01-25 DIAGNOSIS — M9903 Segmental and somatic dysfunction of lumbar region: Secondary | ICD-10-CM | POA: Diagnosis not present

## 2023-01-25 DIAGNOSIS — M5412 Radiculopathy, cervical region: Secondary | ICD-10-CM | POA: Diagnosis not present

## 2023-01-25 DIAGNOSIS — M9901 Segmental and somatic dysfunction of cervical region: Secondary | ICD-10-CM | POA: Diagnosis not present

## 2023-01-25 DIAGNOSIS — M9902 Segmental and somatic dysfunction of thoracic region: Secondary | ICD-10-CM | POA: Diagnosis not present

## 2023-01-26 DIAGNOSIS — M5412 Radiculopathy, cervical region: Secondary | ICD-10-CM | POA: Diagnosis not present

## 2023-01-26 DIAGNOSIS — M9902 Segmental and somatic dysfunction of thoracic region: Secondary | ICD-10-CM | POA: Diagnosis not present

## 2023-01-26 DIAGNOSIS — M9901 Segmental and somatic dysfunction of cervical region: Secondary | ICD-10-CM | POA: Diagnosis not present

## 2023-01-26 DIAGNOSIS — M9903 Segmental and somatic dysfunction of lumbar region: Secondary | ICD-10-CM | POA: Diagnosis not present

## 2023-01-27 DIAGNOSIS — M9903 Segmental and somatic dysfunction of lumbar region: Secondary | ICD-10-CM | POA: Diagnosis not present

## 2023-01-27 DIAGNOSIS — M9902 Segmental and somatic dysfunction of thoracic region: Secondary | ICD-10-CM | POA: Diagnosis not present

## 2023-01-27 DIAGNOSIS — M5412 Radiculopathy, cervical region: Secondary | ICD-10-CM | POA: Diagnosis not present

## 2023-01-27 DIAGNOSIS — M9901 Segmental and somatic dysfunction of cervical region: Secondary | ICD-10-CM | POA: Diagnosis not present

## 2023-01-28 ENCOUNTER — Ambulatory Visit (INDEPENDENT_AMBULATORY_CARE_PROVIDER_SITE_OTHER): Payer: Medicare Other | Admitting: Family

## 2023-01-28 VITALS — BP 146/77 | HR 77 | Temp 97.2°F | Ht 62.0 in | Wt 141.0 lb

## 2023-01-28 DIAGNOSIS — J209 Acute bronchitis, unspecified: Secondary | ICD-10-CM | POA: Diagnosis not present

## 2023-01-28 MED ORDER — ALBUTEROL SULFATE HFA 108 (90 BASE) MCG/ACT IN AERS
2.0000 | INHALATION_SPRAY | Freq: Four times a day (QID) | RESPIRATORY_TRACT | 0 refills | Status: DC | PRN
Start: 2023-01-28 — End: 2023-12-12

## 2023-01-28 MED ORDER — BENZONATATE 200 MG PO CAPS
200.0000 mg | ORAL_CAPSULE | Freq: Three times a day (TID) | ORAL | 1 refills | Status: DC | PRN
Start: 2023-01-28 — End: 2023-09-08

## 2023-01-28 MED ORDER — PREDNISONE 20 MG PO TABS
40.0000 mg | ORAL_TABLET | Freq: Every day | ORAL | 0 refills | Status: AC
Start: 2023-01-28 — End: 2023-02-02

## 2023-01-28 NOTE — Progress Notes (Signed)
Subjective:    Patient ID: [name] Page, female    DOB: 26-Aug-1951, 72 y.o.   MRN: [id number]  Chief Complaint  Patient presents with   Cough    Couple off weeks had evisit with you and no better    PT presents to the office today with cough that started over a month ago. She had a video visit on 01/07/23 and given doxycyline and tessalon that mildly helped. She has continued a dry nonproductive cough.  Cough This is a new problem. The current episode started more than 1 month ago. The problem has been waxing and waning. The problem occurs every few minutes. The cough is Non-productive. Associated symptoms include ear congestion, headaches, myalgias, nasal congestion and a sore throat. Pertinent negatives include no chills, ear pain, fever, shortness of breath or wheezing. She has tried rest and OTC cough suppressant for the symptoms. The treatment provided mild relief.      Review of Systems  Constitutional:  Negative for chills and fever.  HENT:  Positive for sore throat. Negative for ear pain.   Respiratory:  Positive for cough. Negative for shortness of breath and wheezing.   Musculoskeletal:  Positive for myalgias.  Neurological:  Positive for headaches.  All other systems reviewed and are negative.      Objective:   Physical Exam Vitals reviewed.  Constitutional:      General: She is not in acute distress.    Appearance: She is well-developed.  HENT:     Head: Normocephalic and atraumatic.     Right Ear: Tympanic membrane normal.     Left Ear: Tympanic membrane normal.  Eyes:     Pupils: Pupils are equal, round, and reactive to light.  Neck:     Thyroid: No thyromegaly.  Cardiovascular:     Rate and Rhythm: Normal rate and regular rhythm.     Heart sounds: Normal heart sounds. No murmur heard. Pulmonary:     Effort: Pulmonary effort is normal. No respiratory distress.     Breath sounds: Wheezing present.     Comments: Tight nonproductive cough Abdominal:      General: Bowel sounds are normal. There is no distension.     Palpations: Abdomen is soft.     Tenderness: There is no abdominal tenderness.  Musculoskeletal:        General: No tenderness. Normal range of motion.     Cervical back: Normal range of motion and neck supple.  Skin:    General: Skin is warm and dry.  Neurological:     Mental Status: She is alert and oriented to person, place, and time.     Cranial Nerves: No cranial nerve deficit.     Deep Tendon Reflexes: Reflexes are normal and symmetric.  Psychiatric:        Behavior: Behavior normal.        Thought Content: Thought content normal.        Judgment: Judgment normal.      BP (!) 146/77   Pulse 77   Temp (!) 97.2 F (36.2 C) (Temporal)   Ht 5\' 2"  (1.575 m)   Wt 141 lb (64 kg)   SpO2 91%   BMI 25.79 kg/m      Assessment & Plan:  [name] Page comes in today with chief complaint of Cough (Couple off weeks had evisit with you and no better )   Diagnosis and orders addressed:  1. Acute bronchitis, unspecified organism - Take meds as prescribed -  Use a cool mist humidifier  -Use saline nose sprays frequently -Force fluids -For any cough or congestion  Use plain Mucinex- regular strength or max strength is fine -For fever or aces or pains- take tylenol or ibuprofen. -Throat lozenges if help -Follow up if symptoms worsen or do not improve  - predniSONE (DELTASONE) 20 MG tablet; Take 2 tablets (40 mg total) by mouth daily with breakfast for 5 days.  Dispense: 10 tablet; Refill: 0 - albuterol (VENTOLIN HFA) 108 (90 Base) MCG/ACT inhaler; Inhale 2 puffs into the lungs every 6 (six) hours as needed for wheezing or shortness of breath.  Dispense: 8 g; Refill: 0 - benzonatate (TESSALON) 200 MG capsule; Take 1 capsule (200 mg total) by mouth 3 (three) times daily as needed.  Dispense: 30 capsule; Refill: 1  [name] Rodney, FNP

## 2023-01-28 NOTE — Patient Instructions (Signed)

## 2023-02-01 DIAGNOSIS — M9903 Segmental and somatic dysfunction of lumbar region: Secondary | ICD-10-CM | POA: Diagnosis not present

## 2023-02-01 DIAGNOSIS — M5412 Radiculopathy, cervical region: Secondary | ICD-10-CM | POA: Diagnosis not present

## 2023-02-01 DIAGNOSIS — M9901 Segmental and somatic dysfunction of cervical region: Secondary | ICD-10-CM | POA: Diagnosis not present

## 2023-02-01 DIAGNOSIS — M9902 Segmental and somatic dysfunction of thoracic region: Secondary | ICD-10-CM | POA: Diagnosis not present

## 2023-02-02 DIAGNOSIS — M9902 Segmental and somatic dysfunction of thoracic region: Secondary | ICD-10-CM | POA: Diagnosis not present

## 2023-02-02 DIAGNOSIS — M9901 Segmental and somatic dysfunction of cervical region: Secondary | ICD-10-CM | POA: Diagnosis not present

## 2023-02-02 DIAGNOSIS — M5412 Radiculopathy, cervical region: Secondary | ICD-10-CM | POA: Diagnosis not present

## 2023-02-02 DIAGNOSIS — M9903 Segmental and somatic dysfunction of lumbar region: Secondary | ICD-10-CM | POA: Diagnosis not present

## 2023-02-03 DIAGNOSIS — M5412 Radiculopathy, cervical region: Secondary | ICD-10-CM | POA: Diagnosis not present

## 2023-02-03 DIAGNOSIS — M9902 Segmental and somatic dysfunction of thoracic region: Secondary | ICD-10-CM | POA: Diagnosis not present

## 2023-02-03 DIAGNOSIS — M9901 Segmental and somatic dysfunction of cervical region: Secondary | ICD-10-CM | POA: Diagnosis not present

## 2023-02-03 DIAGNOSIS — M9903 Segmental and somatic dysfunction of lumbar region: Secondary | ICD-10-CM | POA: Diagnosis not present

## 2023-02-07 DIAGNOSIS — M9901 Segmental and somatic dysfunction of cervical region: Secondary | ICD-10-CM | POA: Diagnosis not present

## 2023-02-07 DIAGNOSIS — M9902 Segmental and somatic dysfunction of thoracic region: Secondary | ICD-10-CM | POA: Diagnosis not present

## 2023-02-07 DIAGNOSIS — M9903 Segmental and somatic dysfunction of lumbar region: Secondary | ICD-10-CM | POA: Diagnosis not present

## 2023-02-07 DIAGNOSIS — M5412 Radiculopathy, cervical region: Secondary | ICD-10-CM | POA: Diagnosis not present

## 2023-02-08 DIAGNOSIS — M9901 Segmental and somatic dysfunction of cervical region: Secondary | ICD-10-CM | POA: Diagnosis not present

## 2023-02-08 DIAGNOSIS — M9903 Segmental and somatic dysfunction of lumbar region: Secondary | ICD-10-CM | POA: Diagnosis not present

## 2023-02-08 DIAGNOSIS — M5412 Radiculopathy, cervical region: Secondary | ICD-10-CM | POA: Diagnosis not present

## 2023-02-08 DIAGNOSIS — M9902 Segmental and somatic dysfunction of thoracic region: Secondary | ICD-10-CM | POA: Diagnosis not present

## 2023-02-09 DIAGNOSIS — C44712 Basal cell carcinoma of skin of right lower limb, including hip: Secondary | ICD-10-CM | POA: Diagnosis not present

## 2023-02-10 DIAGNOSIS — M9901 Segmental and somatic dysfunction of cervical region: Secondary | ICD-10-CM | POA: Diagnosis not present

## 2023-02-10 DIAGNOSIS — M5412 Radiculopathy, cervical region: Secondary | ICD-10-CM | POA: Diagnosis not present

## 2023-02-10 DIAGNOSIS — M9903 Segmental and somatic dysfunction of lumbar region: Secondary | ICD-10-CM | POA: Diagnosis not present

## 2023-02-10 DIAGNOSIS — M9902 Segmental and somatic dysfunction of thoracic region: Secondary | ICD-10-CM | POA: Diagnosis not present

## 2023-02-15 DIAGNOSIS — M5412 Radiculopathy, cervical region: Secondary | ICD-10-CM | POA: Diagnosis not present

## 2023-02-15 DIAGNOSIS — M9903 Segmental and somatic dysfunction of lumbar region: Secondary | ICD-10-CM | POA: Diagnosis not present

## 2023-02-15 DIAGNOSIS — M9902 Segmental and somatic dysfunction of thoracic region: Secondary | ICD-10-CM | POA: Diagnosis not present

## 2023-02-15 DIAGNOSIS — M9901 Segmental and somatic dysfunction of cervical region: Secondary | ICD-10-CM | POA: Diagnosis not present

## 2023-02-17 DIAGNOSIS — M5412 Radiculopathy, cervical region: Secondary | ICD-10-CM | POA: Diagnosis not present

## 2023-02-17 DIAGNOSIS — M9901 Segmental and somatic dysfunction of cervical region: Secondary | ICD-10-CM | POA: Diagnosis not present

## 2023-02-17 DIAGNOSIS — M9903 Segmental and somatic dysfunction of lumbar region: Secondary | ICD-10-CM | POA: Diagnosis not present

## 2023-02-17 DIAGNOSIS — M9902 Segmental and somatic dysfunction of thoracic region: Secondary | ICD-10-CM | POA: Diagnosis not present

## 2023-02-22 DIAGNOSIS — M9901 Segmental and somatic dysfunction of cervical region: Secondary | ICD-10-CM | POA: Diagnosis not present

## 2023-02-22 DIAGNOSIS — M9903 Segmental and somatic dysfunction of lumbar region: Secondary | ICD-10-CM | POA: Diagnosis not present

## 2023-02-22 DIAGNOSIS — M5412 Radiculopathy, cervical region: Secondary | ICD-10-CM | POA: Diagnosis not present

## 2023-02-22 DIAGNOSIS — M9902 Segmental and somatic dysfunction of thoracic region: Secondary | ICD-10-CM | POA: Diagnosis not present

## 2023-02-23 DIAGNOSIS — M5412 Radiculopathy, cervical region: Secondary | ICD-10-CM | POA: Diagnosis not present

## 2023-02-23 DIAGNOSIS — M9903 Segmental and somatic dysfunction of lumbar region: Secondary | ICD-10-CM | POA: Diagnosis not present

## 2023-02-23 DIAGNOSIS — M9902 Segmental and somatic dysfunction of thoracic region: Secondary | ICD-10-CM | POA: Diagnosis not present

## 2023-02-23 DIAGNOSIS — M9901 Segmental and somatic dysfunction of cervical region: Secondary | ICD-10-CM | POA: Diagnosis not present

## 2023-02-24 DIAGNOSIS — Z48817 Encounter for surgical aftercare following surgery on the skin and subcutaneous tissue: Secondary | ICD-10-CM | POA: Diagnosis not present

## 2023-02-24 DIAGNOSIS — Z4801 Encounter for change or removal of surgical wound dressing: Secondary | ICD-10-CM | POA: Diagnosis not present

## 2023-02-24 DIAGNOSIS — C44519 Basal cell carcinoma of skin of other part of trunk: Secondary | ICD-10-CM | POA: Diagnosis not present

## 2023-03-01 DIAGNOSIS — M5412 Radiculopathy, cervical region: Secondary | ICD-10-CM | POA: Diagnosis not present

## 2023-03-01 DIAGNOSIS — M9903 Segmental and somatic dysfunction of lumbar region: Secondary | ICD-10-CM | POA: Diagnosis not present

## 2023-03-01 DIAGNOSIS — M9902 Segmental and somatic dysfunction of thoracic region: Secondary | ICD-10-CM | POA: Diagnosis not present

## 2023-03-01 DIAGNOSIS — M9901 Segmental and somatic dysfunction of cervical region: Secondary | ICD-10-CM | POA: Diagnosis not present

## 2023-03-02 DIAGNOSIS — M9901 Segmental and somatic dysfunction of cervical region: Secondary | ICD-10-CM | POA: Diagnosis not present

## 2023-03-02 DIAGNOSIS — M5412 Radiculopathy, cervical region: Secondary | ICD-10-CM | POA: Diagnosis not present

## 2023-03-02 DIAGNOSIS — M9903 Segmental and somatic dysfunction of lumbar region: Secondary | ICD-10-CM | POA: Diagnosis not present

## 2023-03-02 DIAGNOSIS — M9902 Segmental and somatic dysfunction of thoracic region: Secondary | ICD-10-CM | POA: Diagnosis not present

## 2023-03-03 DIAGNOSIS — M810 Age-related osteoporosis without current pathological fracture: Secondary | ICD-10-CM | POA: Diagnosis not present

## 2023-03-03 DIAGNOSIS — N958 Other specified menopausal and perimenopausal disorders: Secondary | ICD-10-CM | POA: Diagnosis not present

## 2023-03-03 DIAGNOSIS — Z1231 Encounter for screening mammogram for malignant neoplasm of breast: Secondary | ICD-10-CM | POA: Diagnosis not present

## 2023-03-08 DIAGNOSIS — M9901 Segmental and somatic dysfunction of cervical region: Secondary | ICD-10-CM | POA: Diagnosis not present

## 2023-03-08 DIAGNOSIS — M9902 Segmental and somatic dysfunction of thoracic region: Secondary | ICD-10-CM | POA: Diagnosis not present

## 2023-03-08 DIAGNOSIS — M5412 Radiculopathy, cervical region: Secondary | ICD-10-CM | POA: Diagnosis not present

## 2023-03-08 DIAGNOSIS — M9903 Segmental and somatic dysfunction of lumbar region: Secondary | ICD-10-CM | POA: Diagnosis not present

## 2023-03-10 DIAGNOSIS — M5412 Radiculopathy, cervical region: Secondary | ICD-10-CM | POA: Diagnosis not present

## 2023-03-10 DIAGNOSIS — M9902 Segmental and somatic dysfunction of thoracic region: Secondary | ICD-10-CM | POA: Diagnosis not present

## 2023-03-10 DIAGNOSIS — M9903 Segmental and somatic dysfunction of lumbar region: Secondary | ICD-10-CM | POA: Diagnosis not present

## 2023-03-10 DIAGNOSIS — M9901 Segmental and somatic dysfunction of cervical region: Secondary | ICD-10-CM | POA: Diagnosis not present

## 2023-03-12 ENCOUNTER — Other Ambulatory Visit: Payer: Self-pay | Admitting: Neurosurgery

## 2023-03-12 DIAGNOSIS — D329 Benign neoplasm of meninges, unspecified: Secondary | ICD-10-CM

## 2023-03-15 DIAGNOSIS — M9902 Segmental and somatic dysfunction of thoracic region: Secondary | ICD-10-CM | POA: Diagnosis not present

## 2023-03-15 DIAGNOSIS — M9903 Segmental and somatic dysfunction of lumbar region: Secondary | ICD-10-CM | POA: Diagnosis not present

## 2023-03-15 DIAGNOSIS — M5412 Radiculopathy, cervical region: Secondary | ICD-10-CM | POA: Diagnosis not present

## 2023-03-15 DIAGNOSIS — M9901 Segmental and somatic dysfunction of cervical region: Secondary | ICD-10-CM | POA: Diagnosis not present

## 2023-03-16 DIAGNOSIS — N6489 Other specified disorders of breast: Secondary | ICD-10-CM | POA: Diagnosis not present

## 2023-03-16 DIAGNOSIS — R922 Inconclusive mammogram: Secondary | ICD-10-CM | POA: Diagnosis not present

## 2023-03-18 ENCOUNTER — Encounter: Payer: Self-pay | Admitting: Family Medicine

## 2023-03-23 DIAGNOSIS — M5412 Radiculopathy, cervical region: Secondary | ICD-10-CM | POA: Diagnosis not present

## 2023-03-23 DIAGNOSIS — M9901 Segmental and somatic dysfunction of cervical region: Secondary | ICD-10-CM | POA: Diagnosis not present

## 2023-03-23 DIAGNOSIS — M9903 Segmental and somatic dysfunction of lumbar region: Secondary | ICD-10-CM | POA: Diagnosis not present

## 2023-03-23 DIAGNOSIS — M9902 Segmental and somatic dysfunction of thoracic region: Secondary | ICD-10-CM | POA: Diagnosis not present

## 2023-03-30 DIAGNOSIS — M9901 Segmental and somatic dysfunction of cervical region: Secondary | ICD-10-CM | POA: Diagnosis not present

## 2023-03-30 DIAGNOSIS — M5412 Radiculopathy, cervical region: Secondary | ICD-10-CM | POA: Diagnosis not present

## 2023-03-30 DIAGNOSIS — M9903 Segmental and somatic dysfunction of lumbar region: Secondary | ICD-10-CM | POA: Diagnosis not present

## 2023-03-30 DIAGNOSIS — M9902 Segmental and somatic dysfunction of thoracic region: Secondary | ICD-10-CM | POA: Diagnosis not present

## 2023-03-31 ENCOUNTER — Encounter: Payer: Self-pay | Admitting: Family Medicine

## 2023-04-06 DIAGNOSIS — M9903 Segmental and somatic dysfunction of lumbar region: Secondary | ICD-10-CM | POA: Diagnosis not present

## 2023-04-06 DIAGNOSIS — M5412 Radiculopathy, cervical region: Secondary | ICD-10-CM | POA: Diagnosis not present

## 2023-04-06 DIAGNOSIS — M9902 Segmental and somatic dysfunction of thoracic region: Secondary | ICD-10-CM | POA: Diagnosis not present

## 2023-04-06 DIAGNOSIS — M9901 Segmental and somatic dysfunction of cervical region: Secondary | ICD-10-CM | POA: Diagnosis not present

## 2023-04-13 DIAGNOSIS — M9902 Segmental and somatic dysfunction of thoracic region: Secondary | ICD-10-CM | POA: Diagnosis not present

## 2023-04-13 DIAGNOSIS — M9901 Segmental and somatic dysfunction of cervical region: Secondary | ICD-10-CM | POA: Diagnosis not present

## 2023-04-13 DIAGNOSIS — M9903 Segmental and somatic dysfunction of lumbar region: Secondary | ICD-10-CM | POA: Diagnosis not present

## 2023-04-13 DIAGNOSIS — M5412 Radiculopathy, cervical region: Secondary | ICD-10-CM | POA: Diagnosis not present

## 2023-04-14 DIAGNOSIS — R229 Localized swelling, mass and lump, unspecified: Secondary | ICD-10-CM | POA: Diagnosis not present

## 2023-04-14 DIAGNOSIS — L814 Other melanin hyperpigmentation: Secondary | ICD-10-CM | POA: Diagnosis not present

## 2023-04-14 DIAGNOSIS — C4442 Squamous cell carcinoma of skin of scalp and neck: Secondary | ICD-10-CM | POA: Diagnosis not present

## 2023-04-14 DIAGNOSIS — Z85828 Personal history of other malignant neoplasm of skin: Secondary | ICD-10-CM | POA: Diagnosis not present

## 2023-04-14 DIAGNOSIS — L821 Other seborrheic keratosis: Secondary | ICD-10-CM | POA: Diagnosis not present

## 2023-04-14 DIAGNOSIS — C4359 Malignant melanoma of other part of trunk: Secondary | ICD-10-CM | POA: Diagnosis not present

## 2023-04-14 DIAGNOSIS — L570 Actinic keratosis: Secondary | ICD-10-CM | POA: Diagnosis not present

## 2023-04-14 DIAGNOSIS — Z85820 Personal history of malignant melanoma of skin: Secondary | ICD-10-CM | POA: Diagnosis not present

## 2023-04-14 DIAGNOSIS — C44622 Squamous cell carcinoma of skin of right upper limb, including shoulder: Secondary | ICD-10-CM | POA: Diagnosis not present

## 2023-04-14 DIAGNOSIS — Z08 Encounter for follow-up examination after completed treatment for malignant neoplasm: Secondary | ICD-10-CM | POA: Diagnosis not present

## 2023-04-14 DIAGNOSIS — D485 Neoplasm of uncertain behavior of skin: Secondary | ICD-10-CM | POA: Diagnosis not present

## 2023-04-19 ENCOUNTER — Encounter: Payer: Self-pay | Admitting: Family Medicine

## 2023-04-20 DIAGNOSIS — M9901 Segmental and somatic dysfunction of cervical region: Secondary | ICD-10-CM | POA: Diagnosis not present

## 2023-04-20 DIAGNOSIS — M9903 Segmental and somatic dysfunction of lumbar region: Secondary | ICD-10-CM | POA: Diagnosis not present

## 2023-04-20 DIAGNOSIS — M5412 Radiculopathy, cervical region: Secondary | ICD-10-CM | POA: Diagnosis not present

## 2023-04-20 DIAGNOSIS — M9902 Segmental and somatic dysfunction of thoracic region: Secondary | ICD-10-CM | POA: Diagnosis not present

## 2023-04-23 ENCOUNTER — Ambulatory Visit
Admission: RE | Admit: 2023-04-23 | Discharge: 2023-04-23 | Disposition: A | Discharge status: Home / Self Care | Payer: Medicare Other | Source: Ambulatory Visit | Attending: Neurosurgery | Admitting: Neurosurgery

## 2023-04-23 DIAGNOSIS — D329 Benign neoplasm of meninges, unspecified: Secondary | ICD-10-CM

## 2023-04-23 DIAGNOSIS — D320 Benign neoplasm of cerebral meninges: Secondary | ICD-10-CM | POA: Diagnosis not present

## 2023-04-23 DIAGNOSIS — Z9889 Other specified postprocedural states: Secondary | ICD-10-CM | POA: Diagnosis not present

## 2023-04-23 MED ORDER — GADOPICLENOL 0.5 MMOL/ML IV SOLN
6.0000 mL | Freq: Once | INTRAVENOUS | Status: AC | PRN
  Administered 2023-04-23: 6 mL via INTRAVENOUS

## 2023-04-27 DIAGNOSIS — M9903 Segmental and somatic dysfunction of lumbar region: Secondary | ICD-10-CM | POA: Diagnosis not present

## 2023-04-27 DIAGNOSIS — M9901 Segmental and somatic dysfunction of cervical region: Secondary | ICD-10-CM | POA: Diagnosis not present

## 2023-04-27 DIAGNOSIS — M9902 Segmental and somatic dysfunction of thoracic region: Secondary | ICD-10-CM | POA: Diagnosis not present

## 2023-04-27 DIAGNOSIS — M5412 Radiculopathy, cervical region: Secondary | ICD-10-CM | POA: Diagnosis not present

## 2023-05-13 DIAGNOSIS — D329 Benign neoplasm of meninges, unspecified: Secondary | ICD-10-CM | POA: Diagnosis not present

## 2023-05-13 DIAGNOSIS — Z6825 Body mass index (BMI) 25.0-25.9, adult: Secondary | ICD-10-CM | POA: Diagnosis not present

## 2023-05-24 DIAGNOSIS — D0461 Carcinoma in situ of skin of right upper limb, including shoulder: Secondary | ICD-10-CM | POA: Diagnosis not present

## 2023-06-07 DIAGNOSIS — D0461 Carcinoma in situ of skin of right upper limb, including shoulder: Secondary | ICD-10-CM | POA: Diagnosis not present

## 2023-06-07 DIAGNOSIS — L570 Actinic keratosis: Secondary | ICD-10-CM | POA: Diagnosis not present

## 2023-06-17 ENCOUNTER — Encounter: Payer: Medicare Other | Admitting: Family Medicine

## 2023-07-02 DIAGNOSIS — Z23 Encounter for immunization: Secondary | ICD-10-CM | POA: Diagnosis not present

## 2023-07-15 DIAGNOSIS — D1801 Hemangioma of skin and subcutaneous tissue: Secondary | ICD-10-CM | POA: Diagnosis not present

## 2023-07-15 DIAGNOSIS — L821 Other seborrheic keratosis: Secondary | ICD-10-CM | POA: Diagnosis not present

## 2023-07-15 DIAGNOSIS — Z85828 Personal history of other malignant neoplasm of skin: Secondary | ICD-10-CM | POA: Diagnosis not present

## 2023-07-15 DIAGNOSIS — R229 Localized swelling, mass and lump, unspecified: Secondary | ICD-10-CM | POA: Diagnosis not present

## 2023-07-15 DIAGNOSIS — C44622 Squamous cell carcinoma of skin of right upper limb, including shoulder: Secondary | ICD-10-CM | POA: Diagnosis not present

## 2023-07-15 DIAGNOSIS — L570 Actinic keratosis: Secondary | ICD-10-CM | POA: Diagnosis not present

## 2023-07-15 DIAGNOSIS — D0471 Carcinoma in situ of skin of right lower limb, including hip: Secondary | ICD-10-CM | POA: Diagnosis not present

## 2023-07-15 DIAGNOSIS — D485 Neoplasm of uncertain behavior of skin: Secondary | ICD-10-CM | POA: Diagnosis not present

## 2023-07-15 DIAGNOSIS — Z08 Encounter for follow-up examination after completed treatment for malignant neoplasm: Secondary | ICD-10-CM | POA: Diagnosis not present

## 2023-07-15 DIAGNOSIS — Z85820 Personal history of malignant melanoma of skin: Secondary | ICD-10-CM | POA: Diagnosis not present

## 2023-07-15 DIAGNOSIS — C4359 Malignant melanoma of other part of trunk: Secondary | ICD-10-CM | POA: Diagnosis not present

## 2023-07-25 DIAGNOSIS — D0471 Carcinoma in situ of skin of right lower limb, including hip: Secondary | ICD-10-CM | POA: Diagnosis not present

## 2023-09-08 ENCOUNTER — Encounter: Payer: Self-pay | Admitting: Nurse Practitioner

## 2023-09-08 ENCOUNTER — Ambulatory Visit (INDEPENDENT_AMBULATORY_CARE_PROVIDER_SITE_OTHER): Payer: Medicare Other | Admitting: Nurse Practitioner

## 2023-09-08 VITALS — BP 182/91 | HR 67 | Temp 99.1°F | Ht 62.0 in | Wt 144.6 lb

## 2023-09-08 DIAGNOSIS — J40 Bronchitis, not specified as acute or chronic: Secondary | ICD-10-CM

## 2023-09-08 MED ORDER — BENZONATATE 100 MG PO CAPS: 100.0000 mg | ORAL_CAPSULE | Freq: Three times a day (TID) | ORAL | 0 refills | Status: DC | PRN

## 2023-09-08 MED ORDER — AZITHROMYCIN 250 MG PO TABS: ORAL_TABLET | ORAL | 0 refills | Status: DC

## 2023-09-08 NOTE — Patient Instructions (Signed)

## 2023-09-08 NOTE — Progress Notes (Signed)
Subjective:    Patient ID: [name] Page, female    DOB: Dec 12, 1950, 72 y.o.   MRN: [id number]  Chief Complaint: URI  URI  This is a new problem. The current episode started in the past 7 days. The problem has been waxing and waning. There has been no fever. Associated symptoms include congestion, coughing and rhinorrhea. Pertinent negatives include no sneezing or sore throat. Treatments tried: robitussin and zycam.    Patient Active Problem List   Diagnosis Date Noted   History of resection of meningioma 11/02/2020   Peripheral edema    Hypoalbuminemia due to protein-calorie malnutrition (HCC)    Transaminitis    Tremor    Iron deficiency anemia    CKD (chronic kidney disease), stage III (HCC) 09/10/2020   Vasogenic Cerebral edema (HCC) 09/04/2020   Cancer of skin, squamous cell 09/04/2020   Nephrolithiasis 09/04/2020   Essential hypertension 11/01/2015   Osteoporosis 10/30/2015       Review of Systems  Constitutional:  Negative for chills and fever.  HENT:  Positive for congestion and rhinorrhea. Negative for sneezing and sore throat.   Respiratory:  Positive for cough.   Gastrointestinal: Negative.        Objective:   Physical Exam Vitals and nursing note reviewed.  Constitutional:      General: She is not in acute distress.    Appearance: Normal appearance. She is well-developed.  HENT:     Head: Normocephalic.     Right Ear: Tympanic membrane normal.     Left Ear: Tympanic membrane normal.     Nose: Nose normal.     Mouth/Throat:     Mouth: Mucous membranes are moist.  Eyes:     Pupils: Pupils are equal, round, and reactive to light.  Neck:     Vascular: No carotid bruit or JVD.  Cardiovascular:     Rate and Rhythm: Normal rate and regular rhythm.     Heart sounds: Normal heart sounds.  Pulmonary:     Effort: Pulmonary effort is normal. No respiratory distress.     Breath sounds: Normal breath sounds. No wheezing or rales.     Comments: Deep dry  cough Chest:     Chest wall: No tenderness.  Abdominal:     General: Bowel sounds are normal. There is no distension or abdominal bruit.     Palpations: Abdomen is soft. There is no hepatomegaly, splenomegaly, mass or pulsatile mass.     Tenderness: There is no abdominal tenderness.  Musculoskeletal:        General: Normal range of motion.     Cervical back: Normal range of motion and neck supple.  Lymphadenopathy:     Cervical: No cervical adenopathy.  Skin:    General: Skin is warm and dry.  Neurological:     Mental Status: She is alert and oriented to person, place, and time.     Deep Tendon Reflexes: Reflexes are normal and symmetric.  Psychiatric:        Behavior: Behavior normal.        Thought Content: Thought content normal.        Judgment: Judgment normal.            Assessment & Plan:   [name] Page in today with chief complaint of Cough and Nasal Congestion   1. Bronchitis (Primary) 1. Take meds as prescribed 2. Use a cool mist humidifier especially during the winter months and when heat has been humid. 3. Use  saline nose sprays frequently 4. Saline irrigations of the nose can be very helpful if done frequently.  * 4X daily for 1 week*  * Use of a nettie pot can be helpful with this. Follow directions with this* 5. Drink plenty of fluids 6. Keep thermostat turn down low 7.For any cough or congestion- tessalon perles 8. For fever or aces or pains- take tylenol or ibuprofen appropriate for age and weight.  * for fevers greater than 101 orally you may alternate ibuprofen and tylenol every  3 hours.       The above assessment and management plan was discussed with the patient. The patient verbalized understanding of and has agreed to the management plan. Patient is aware to call the clinic if symptoms persist or worsen. Patient is aware when to return to the clinic for a follow-up visit. Patient educated on when it is appropriate to go to the emergency  department.   Mary-Margaret Daphine Deutscher, FNP

## 2023-10-17 DIAGNOSIS — M5412 Radiculopathy, cervical region: Secondary | ICD-10-CM | POA: Diagnosis not present

## 2023-10-17 DIAGNOSIS — M9902 Segmental and somatic dysfunction of thoracic region: Secondary | ICD-10-CM | POA: Diagnosis not present

## 2023-10-17 DIAGNOSIS — M9903 Segmental and somatic dysfunction of lumbar region: Secondary | ICD-10-CM | POA: Diagnosis not present

## 2023-10-17 DIAGNOSIS — M9901 Segmental and somatic dysfunction of cervical region: Secondary | ICD-10-CM | POA: Diagnosis not present

## 2023-10-27 DIAGNOSIS — Z85820 Personal history of malignant melanoma of skin: Secondary | ICD-10-CM | POA: Diagnosis not present

## 2023-10-27 DIAGNOSIS — R229 Localized swelling, mass and lump, unspecified: Secondary | ICD-10-CM | POA: Diagnosis not present

## 2023-10-27 DIAGNOSIS — D0461 Carcinoma in situ of skin of right upper limb, including shoulder: Secondary | ICD-10-CM | POA: Diagnosis not present

## 2023-10-27 DIAGNOSIS — L578 Other skin changes due to chronic exposure to nonionizing radiation: Secondary | ICD-10-CM | POA: Diagnosis not present

## 2023-10-27 DIAGNOSIS — Z85828 Personal history of other malignant neoplasm of skin: Secondary | ICD-10-CM | POA: Diagnosis not present

## 2023-10-27 DIAGNOSIS — Z08 Encounter for follow-up examination after completed treatment for malignant neoplasm: Secondary | ICD-10-CM | POA: Diagnosis not present

## 2023-10-27 DIAGNOSIS — C44722 Squamous cell carcinoma of skin of right lower limb, including hip: Secondary | ICD-10-CM | POA: Diagnosis not present

## 2023-10-27 DIAGNOSIS — C44622 Squamous cell carcinoma of skin of right upper limb, including shoulder: Secondary | ICD-10-CM | POA: Diagnosis not present

## 2023-10-27 DIAGNOSIS — D485 Neoplasm of uncertain behavior of skin: Secondary | ICD-10-CM | POA: Diagnosis not present

## 2023-10-27 DIAGNOSIS — L570 Actinic keratosis: Secondary | ICD-10-CM | POA: Diagnosis not present

## 2023-10-27 DIAGNOSIS — C4359 Malignant melanoma of other part of trunk: Secondary | ICD-10-CM | POA: Diagnosis not present

## 2023-10-31 DIAGNOSIS — M9901 Segmental and somatic dysfunction of cervical region: Secondary | ICD-10-CM | POA: Diagnosis not present

## 2023-10-31 DIAGNOSIS — M9903 Segmental and somatic dysfunction of lumbar region: Secondary | ICD-10-CM | POA: Diagnosis not present

## 2023-10-31 DIAGNOSIS — M5412 Radiculopathy, cervical region: Secondary | ICD-10-CM | POA: Diagnosis not present

## 2023-10-31 DIAGNOSIS — M9902 Segmental and somatic dysfunction of thoracic region: Secondary | ICD-10-CM | POA: Diagnosis not present

## 2023-11-03 DIAGNOSIS — M9903 Segmental and somatic dysfunction of lumbar region: Secondary | ICD-10-CM | POA: Diagnosis not present

## 2023-11-03 DIAGNOSIS — M9902 Segmental and somatic dysfunction of thoracic region: Secondary | ICD-10-CM | POA: Diagnosis not present

## 2023-11-03 DIAGNOSIS — M9901 Segmental and somatic dysfunction of cervical region: Secondary | ICD-10-CM | POA: Diagnosis not present

## 2023-11-03 DIAGNOSIS — M62830 Muscle spasm of back: Secondary | ICD-10-CM | POA: Diagnosis not present

## 2023-11-07 DIAGNOSIS — M9902 Segmental and somatic dysfunction of thoracic region: Secondary | ICD-10-CM | POA: Diagnosis not present

## 2023-11-07 DIAGNOSIS — M9903 Segmental and somatic dysfunction of lumbar region: Secondary | ICD-10-CM | POA: Diagnosis not present

## 2023-11-07 DIAGNOSIS — M62830 Muscle spasm of back: Secondary | ICD-10-CM | POA: Diagnosis not present

## 2023-11-07 DIAGNOSIS — M9901 Segmental and somatic dysfunction of cervical region: Secondary | ICD-10-CM | POA: Diagnosis not present

## 2023-11-14 DIAGNOSIS — M9903 Segmental and somatic dysfunction of lumbar region: Secondary | ICD-10-CM | POA: Diagnosis not present

## 2023-11-14 DIAGNOSIS — M9901 Segmental and somatic dysfunction of cervical region: Secondary | ICD-10-CM | POA: Diagnosis not present

## 2023-11-14 DIAGNOSIS — M9902 Segmental and somatic dysfunction of thoracic region: Secondary | ICD-10-CM | POA: Diagnosis not present

## 2023-11-14 DIAGNOSIS — M62830 Muscle spasm of back: Secondary | ICD-10-CM | POA: Diagnosis not present

## 2023-11-23 DIAGNOSIS — L570 Actinic keratosis: Secondary | ICD-10-CM | POA: Diagnosis not present

## 2023-11-23 DIAGNOSIS — D0461 Carcinoma in situ of skin of right upper limb, including shoulder: Secondary | ICD-10-CM | POA: Diagnosis not present

## 2023-11-28 DIAGNOSIS — M9901 Segmental and somatic dysfunction of cervical region: Secondary | ICD-10-CM | POA: Diagnosis not present

## 2023-11-28 DIAGNOSIS — M9903 Segmental and somatic dysfunction of lumbar region: Secondary | ICD-10-CM | POA: Diagnosis not present

## 2023-11-28 DIAGNOSIS — M62830 Muscle spasm of back: Secondary | ICD-10-CM | POA: Diagnosis not present

## 2023-11-28 DIAGNOSIS — M9902 Segmental and somatic dysfunction of thoracic region: Secondary | ICD-10-CM | POA: Diagnosis not present

## 2023-12-07 DIAGNOSIS — D0461 Carcinoma in situ of skin of right upper limb, including shoulder: Secondary | ICD-10-CM | POA: Diagnosis not present

## 2023-12-12 ENCOUNTER — Other Ambulatory Visit: Payer: Self-pay | Admitting: Family Medicine

## 2023-12-12 ENCOUNTER — Ambulatory Visit (INDEPENDENT_AMBULATORY_CARE_PROVIDER_SITE_OTHER): Payer: Medicare Other | Admitting: Family Medicine

## 2023-12-12 ENCOUNTER — Encounter: Payer: Self-pay | Admitting: Family Medicine

## 2023-12-12 VITALS — BP 150/80 | HR 77 | Temp 98.3°F | Ht 62.0 in | Wt 143.2 lb

## 2023-12-12 DIAGNOSIS — M2550 Pain in unspecified joint: Secondary | ICD-10-CM

## 2023-12-12 DIAGNOSIS — M810 Age-related osteoporosis without current pathological fracture: Secondary | ICD-10-CM

## 2023-12-12 DIAGNOSIS — D509 Iron deficiency anemia, unspecified: Secondary | ICD-10-CM | POA: Diagnosis not present

## 2023-12-12 DIAGNOSIS — Z23 Encounter for immunization: Secondary | ICD-10-CM

## 2023-12-12 DIAGNOSIS — I10 Essential (primary) hypertension: Secondary | ICD-10-CM | POA: Diagnosis not present

## 2023-12-12 DIAGNOSIS — Z1159 Encounter for screening for other viral diseases: Secondary | ICD-10-CM | POA: Diagnosis not present

## 2023-12-12 DIAGNOSIS — E782 Mixed hyperlipidemia: Secondary | ICD-10-CM | POA: Diagnosis not present

## 2023-12-12 DIAGNOSIS — J301 Allergic rhinitis due to pollen: Secondary | ICD-10-CM

## 2023-12-12 MED ORDER — ROSUVASTATIN CALCIUM 10 MG PO TABS
10.0000 mg | ORAL_TABLET | Freq: Every day | ORAL | 3 refills | Status: AC
Start: 2023-12-12 — End: ?

## 2023-12-12 MED ORDER — IBUPROFEN 600 MG PO TABS
600.0000 mg | ORAL_TABLET | Freq: Three times a day (TID) | ORAL | 0 refills | Status: AC | PRN
Start: 2023-12-12 — End: ?

## 2023-12-12 MED ORDER — AZELASTINE HCL 0.1 % NA SOLN
1.0000 | Freq: Two times a day (BID) | NASAL | 12 refills | Status: AC
Start: 2023-12-12 — End: ?

## 2023-12-12 MED ORDER — RISEDRONATE SODIUM 150 MG PO TABS
150.0000 mg | ORAL_TABLET | ORAL | 4 refills | Status: DC
Start: 2023-12-12 — End: 2023-12-13

## 2023-12-12 MED ORDER — ALBUTEROL SULFATE HFA 108 (90 BASE) MCG/ACT IN AERS
2.0000 | INHALATION_SPRAY | Freq: Four times a day (QID) | RESPIRATORY_TRACT | 0 refills | Status: DC | PRN
Start: 2023-12-12 — End: 2024-05-28

## 2023-12-12 MED ORDER — CYCLOBENZAPRINE HCL 5 MG PO TABS
5.0000 mg | ORAL_TABLET | Freq: Three times a day (TID) | ORAL | 0 refills | Status: AC | PRN
Start: 2023-12-12 — End: ?

## 2023-12-12 NOTE — Telephone Encounter (Signed)
 Name from pharmacy: RISEDRONATE SODIUM 150 MG TAB  Pharmacy comment: Alternative Requested:NOT COVERED BY INSURANCE.

## 2023-12-12 NOTE — Telephone Encounter (Signed)
 Please let patient know that this is no longer covered by insurance.  She was previously on Fosamax.  I can send in order for Prolia if she wants to try doing that instead

## 2023-12-12 NOTE — Progress Notes (Signed)
 [name] Page is a 73 y.o. female presents to office today for annual physical exam examination.    Concerns today include: 1.  Polyarthralgia Patient with ongoing polyarthralgia.  This has been refractory to Celebrex in the past.  She typically takes 400 mg of ibuprofen and this does something but really does not resolve issues.  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  Most recent renal function in the CKD to stage.  She continues to hydrate really well.  Utilizing Tylenol more often than ibuprofen.  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  She continues to exercise at least 3 days/week.  Compliant with her vitamin D and calcium along with her Actonel which she takes for osteoporosis.  Not having any concerning side effects of the medication.  She has had a little bit of rhinorrhea and is taking Zyrtec and utilizing as needed Afrin  Occupation: retired, Substance use: none Health Maintenance Due  Topic Date Due   DTaP/Tdap/Td (1 - Tdap) Never done   Medicare Annual Wellness (AWV)  01/10/2024   Refills needed today: all  Immunization History  Administered Date(s) Administered   Fluad Quad(high Dose 65+) 06/27/2019, 07/18/2020, 07/20/2021, 06/09/2022   Hepatitis B 04/21/1994   Influenza, High Dose Seasonal PF 08/08/2015, 07/07/2018   Influenza,inj,Quad PF,6+ Mos 08/11/2013   Influenza-Unspecified 06/05/2014   Moderna SARS-COV2 Booster Vaccination 09/16/2020, 09/16/2020   Moderna Sars-Covid-2 Vaccination 10/13/2019, 11/15/2019   Pneumococcal Conjugate-13 10/30/2015   Pneumococcal Polysaccharide-23 08/14/2018   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   sees Dr Terri Piedra   Cancer [facility])    melanoma   Dysuria    Frequency of  urination    Hematuria    History of kidney stones    Meningioma (HCC) 09/04/2020   Osteoporosis    Renal calculus, bilateral    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   Urgency of urination    Wears glasses    Social History   Socioeconomic History   Marital status: Divorced    Spouse name: Not on file   Number of children: 1   Years of education: 12   Highest education level: 12th grade  Occupational History   Occupation: retired  Tobacco Use   Smoking status: Never   Smokeless tobacco: Never  Vaping Use   Vaping status: Never Used  Substance and Sexual Activity   Alcohol use: No   Drug use: No   Sexual activity: Not Currently  Other Topics Concern   Not on file  Social History Narrative   Patient resides locally.  She is currently separated from her spouse.  She is living in a mobile home near her daughter and 2 grandchildren and her father.   Social Drivers of Corporate investment banker Strain: Low Risk  (09/07/2023)   Overall Financial Resource Strain (CARDIA)    Difficulty of Paying Living Expenses: Not hard at all  Food Insecurity: No Food Insecurity (09/07/2023)   Hunger Vital Sign    Worried About Running Out of Food in the Last Year: Never true    Ran Out of Food in the Last Year: Never true  Transportation Needs: No Transportation Needs (09/07/2023)  PRAPARE - Administrator, Civil Service (Medical): No    Lack of Transportation (Non-Medical): No  Physical Activity: Insufficiently Active (09/07/2023)   Exercise Vital Sign    Days of Exercise per Week: 3 days    Minutes of Exercise per Session: 40 min  Stress: No Stress Concern Present (09/07/2023)   [employer] of Occupational Health - Occupational Stress Questionnaire    Feeling of Stress : Only a little  Social Connections: Moderately Integrated (09/07/2023)   Social Connection and Isolation Panel [NHANES]    Frequency of Communication with Friends and Family: More than  three times a week    Frequency of Social Gatherings with Friends and Family: Three times a week    Attends Religious Services: More than 4 times per year    Active Member of Clubs or Organizations: Yes    Attends Banker Meetings: More than 4 times per year    Marital Status: Divorced  Intimate Partner Violence: Not At Risk (01/10/2023)   Humiliation, Afraid, Rape, and Kick questionnaire    Fear of Current or Ex-Partner: No    Emotionally Abused: No    Physically Abused: No    Sexually Abused: No   Past Surgical History:  Procedure Laterality Date   APPLICATION OF CRANIAL NAVIGATION Right 09/11/2020   Procedure: APPLICATION OF CRANIAL NAVIGATION;  Surgeon: Bedelia Person, MD;  Location: [facility] OR;  Service: Neurosurgery;  Laterality: Right;   CRANIOTOMY Right 09/11/2020   Procedure: Right Pterional Craniotomy for resection of anterior skull base mass with Brain Lab;  Surgeon: Bedelia Person, MD;  Location: [facility] OR;  Service: Neurosurgery;  Laterality: Right;   CYSTOSCOPY WITH RETROGRADE PYELOGRAM, URETEROSCOPY AND STENT PLACEMENT Bilateral 08/11/2013   Procedure: CYSTOSCOPY WITH RETROGRADE PYELOGRAM, URETEROSCOPY AND STENT PLACEMENT;  Surgeon: Sebastian Ache, MD;  Location: [facility] ORS;  Service: Urology;  Laterality: Bilateral;   CYSTOSCOPY WITH URETEROSCOPY AND STENT PLACEMENT Bilateral 08/29/2013   Procedure: CYSTOSCOPY WITH BILATERAL URETEROSCOPY AND STENT REPLACEMENTS, stone extraction;  Surgeon: Sebastian Ache, MD;  Location: [facility];  Service: Urology;  Laterality: Bilateral;   CYSTOSCOPY/RETROGRADE/URETEROSCOPY/STONE EXTRACTION WITH BASKET Bilateral 01/31/2016   Procedure: CYSTOSCOPY/BILATERAL RETROGRADE PYELOGRAMS/BILATERAL URETEROSCOPY/BILATERAL STONE EXTRACTION WITH BASKET/RIGHT URETERAL STENT PLACEMENT;  Surgeon: Heloise Purpura, MD;  Location: [facility];  Service: Urology;  Laterality: Bilateral;   HEMIARTHROPLASTY SHOULDER FRACTURE Left 1992   HOLMIUM  LASER APPLICATION Bilateral 08/29/2013   Procedure: HOLMIUM LASER APPLICATION;  Surgeon: Sebastian Ache, MD;  Location: [facility]  Service: Urology;  Laterality: Bilateral;   Family History  Problem Relation Age of Onset   Cancer Mother        unknown source, stomach and liver    Atrial fibrillation Father    Hyperlipidemia Father    Hypertension Father    Colon polyps Father    Hypertension Sister    Hyperlipidemia Sister    Thyroid disease Sister    Esophageal cancer Neg Hx    Pancreatic cancer Neg Hx     Current Outpatient Medications:    acetaminophen (TYLENOL) 500 MG tablet, Take 500 mg by mouth every 6 (six) hours as needed., Disp: , Rfl:    albuterol (VENTOLIN HFA) 108 (90 Base) MCG/ACT inhaler, Inhale 2 puffs into the lungs every 6 (six) hours as needed for wheezing or shortness of breath., Disp: 8 g, Rfl: 0   Calcium Carbonate-Vit D-Min (CALCIUM 1200 PO), Take 1 tablet by mouth daily., Disp: , Rfl:    cyclobenzaprine (  FLEXERIL) 5 MG tablet, Take 1 tablet (5 mg total) by mouth 3 (three) times daily as needed for muscle spasms., Disp: 30 tablet, Rfl: 0   Glycerin-Hypromellose-PEG 400 0.2-0.2-1 % SOLN, Apply 1 drop to eye 2 (two) times daily as needed (dry eyes). , Disp: , Rfl:    ibuprofen (ADVIL) 400 MG tablet, Take 400 mg by mouth every other day as needed for moderate pain., Disp: , Rfl:    Multiple Vitamin (MULTIVITAMIN WITH MINERALS) TABS tablet, Take 1 tablet by mouth daily., Disp: , Rfl:    risedronate (ACTONEL) 150 MG tablet, Take 1 tablet (150 mg total) by mouth every 30 (thirty) days. with water on empty stomach, nothing by mouth or lie down for next 30 minutes., Disp: 3 tablet, Rfl: 4   rosuvastatin (CRESTOR) 10 MG tablet, Take 1 tablet (10 mg total) by mouth daily., Disp: 90 tablet, Rfl: 3  Allergies  Allergen Reactions   Sulfa Antibiotics Rash     ROS: Review of Systems Pertinent items noted in HPI and remainder of comprehensive ROS otherwise  negative.    Physical exam BP (!) 150/80   Pulse 77   Temp 98.3 F (36.8 C)   Ht 5\' 2"  (1.575 m)   Wt 143 lb 3.2 oz (65 kg)   SpO2 96%   BMI 26.19 kg/m  General appearance: alert, cooperative, appears stated age, and no distress Head: Normocephalic, without obvious abnormality, atraumatic Eyes: negative findings: lids and lashes normal, conjunctivae and sclerae normal, corneas clear, and pupils equal, round, reactive to light and accomodation Ears: normal TM's and external ear canals both ears Nose: Nares normal. Septum midline. Mucosa normal. No drainage or sinus tenderness. Throat: lips, mucosa, and tongue normal; teeth and gums normal Neck: no adenopathy, supple, symmetrical, trachea midline, and thyroid not enlarged, symmetric, no tenderness/mass/nodules Back:  Increased kyphosis of the thoracic spine.  She ambulates independently Lungs: clear to auscultation bilaterally Heart: regular rate and rhythm, S1, S2 normal, no murmur, click, rub or gallop Abdomen: soft, non-tender; bowel sounds normal; no masses,  no organomegaly Extremities:  Bandage in the right forearm present. Pulses: 2+ and symmetric Skin:  As above. Lymph nodes: Cervical, supraclavicular, and axillary nodes normal. Neurologic: Some difficulty with hearing     01/10/2023    1:54 PM 11/03/2022    3:34 PM 01/08/2022    1:13 PM  Depression screen PHQ 2/9  Decreased Interest 0 0 0  Down, Depressed, Hopeless 0 0 0  PHQ - 2 Score 0 0 0  Altered sleeping  0   Tired, decreased energy  0   Change in appetite  0   Feeling bad or failure about yourself   0   Trouble concentrating  0   Moving slowly or fidgety/restless  0   Suicidal thoughts  0   PHQ-9 Score  0   Difficult doing work/chores  Not difficult at all       11/03/2022    3:33 PM 01/08/2022    1:13 PM 07/20/2021   10:15 AM 05/06/2021    3:32 PM  GAD 7 : Generalized Anxiety Score  Nervous, Anxious, on Edge 0 0 0 1  Control/stop worrying 0 0 0 0   Worry too much - different things 0 0 0 0  Trouble relaxing 0 0 0 0  Restless 0 0 0 0  Easily annoyed or irritable 0 0 0 0  Afraid - awful might happen 0 0 0 0  Total GAD 7 Score 0  0 0 1  Anxiety Difficulty Not difficult at all Not difficult at all Not difficult at all Not difficult at all     Assessment/ Plan: [name] Page here for annual physical exam.   Age-related osteoporosis without current pathological fracture - Plan: CMP14+EGFR, VITAMIN D 25 Hydroxy (Vit-D Deficiency, Fractures), risedronate (ACTONEL) 150 MG tablet  Hyperlipemia, mixed - Plan: CMP14+EGFR, Lipid Panel, rosuvastatin (CRESTOR) 10 MG tablet  Essential hypertension - Plan: CMP14+EGFR  Iron deficiency anemia, unspecified iron deficiency anemia type - Plan: CBC, Iron, TIBC and Ferritin Panel  Need for hepatitis C screening test - Plan: Hepatitis C Antibody  Polyarthralgia - Plan: cyclobenzaprine (FLEXERIL) 5 MG tablet, ibuprofen (ADVIL) 600 MG tablet  Seasonal allergic rhinitis due to pollen - Plan: azelastine (ASTELIN) 0.1 % nasal spray, albuterol (VENTOLIN HFA) 108 (90 Base) MCG/ACT inhaler  No symptomology from use of Actonel.  Check vitamin D, renal function.  Actonel renewed.  Continue supplement  Fasting lipid collected.  Statin renewed  Blood pressure borderline but tentatively appropriate for age.  No changes for now  Iron, CBC collected given history of iron deficiency anemia as well as hepatitis C screening antibody  Flexeril renewed and we will try her on high-dose Advil 600 mg up to 3 times daily as needed.  Would like to try and avoid nephrotoxic agents as much as possible.  We discussed potential for tramadol but she wanted to hold off on opioid treatment for now  Trial of Astelin for seasonal allergic rhinitis.  Tetanus shot administered  Counseled on healthy lifestyle choices, including diet (rich in fruits, vegetables and lean meats and low in salt and simple carbohydrates) and exercise  (at least 30 minutes of moderate physical activity daily).  Patient to follow up 43m for pain recheck/ renal function  Marco Raper M. Nadine Counts, DO

## 2023-12-13 ENCOUNTER — Encounter: Payer: Self-pay | Admitting: Family Medicine

## 2023-12-13 LAB — LIPID PANEL
Chol/HDL Ratio: 2.1 ratio (ref 0.0–4.4)
Cholesterol, Total: 147 mg/dL (ref 100–199)
HDL: 70 mg/dL (ref >39)
LDL Chol Calc (NIH): 56 mg/dL (ref 0–99)
Triglycerides: 118 mg/dL (ref 0–149)
VLDL Cholesterol Cal: 21 mg/dL (ref 5–40)

## 2023-12-13 LAB — CMP14+EGFR
ALT: 13 IU/L (ref 0–32)
AST: 25 IU/L (ref 0–40)
Albumin: 4.4 g/dL (ref 3.8–4.8)
Alkaline Phosphatase: 127 IU/L — ABNORMAL HIGH (ref 44–121)
BUN/Creatinine Ratio: 16 (ref 12–28)
BUN: 16 mg/dL (ref 8–27)
Bilirubin Total: 0.5 mg/dL (ref 0.0–1.2)
CO2: 23 mmol/L (ref 20–29)
Calcium: 9.8 mg/dL (ref 8.7–10.3)
Chloride: 106 mmol/L (ref 96–106)
Creatinine, Ser: 0.98 mg/dL (ref 0.57–1.00)
Globulin, Total: 2.7 g/dL (ref 1.5–4.5)
Glucose: 90 mg/dL (ref 70–99)
Potassium: 4.2 mmol/L (ref 3.5–5.2)
Sodium: 142 mmol/L (ref 134–144)
Total Protein: 7.1 g/dL (ref 6.0–8.5)
eGFR: 61 mL/min/{1.73_m2} (ref >59)

## 2023-12-13 LAB — CBC
Hematocrit: 40.2 % (ref 34.0–46.6)
Hemoglobin: 12.6 g/dL (ref 11.1–15.9)
MCH: 26.1 pg — ABNORMAL LOW (ref 26.6–33.0)
MCHC: 31.3 g/dL — ABNORMAL LOW (ref 31.5–35.7)
MCV: 83 fL (ref 79–97)
Platelets: 204 10*3/uL (ref 150–450)
RBC: 4.83 x10E6/uL (ref 3.77–5.28)
RDW: 14.2 % (ref 11.7–15.4)
WBC: 6.3 10*3/uL (ref 3.4–10.8)

## 2023-12-13 LAB — IRON,TIBC AND FERRITIN PANEL
Ferritin: 14 ng/mL — ABNORMAL LOW (ref 15–150)
Iron Saturation: 20 % (ref 15–55)
Iron: 67 ug/dL (ref 27–139)
Total Iron Binding Capacity: 339 ug/dL (ref 250–450)
UIBC: 272 ug/dL (ref 118–369)

## 2023-12-13 LAB — VITAMIN D 25 HYDROXY (VIT D DEFICIENCY, FRACTURES): Vit D, 25-Hydroxy: 39.1 ng/mL (ref 30.0–100.0)

## 2023-12-13 LAB — HEPATITIS C ANTIBODY: Hep C Virus Ab: NONREACTIVE

## 2023-12-13 NOTE — Telephone Encounter (Signed)
 Pt ok with going back on the Fosamax which was once a week, had just changed to the once a month medication. Will try Prolia at a later date.

## 2023-12-19 DIAGNOSIS — M9901 Segmental and somatic dysfunction of cervical region: Secondary | ICD-10-CM | POA: Diagnosis not present

## 2023-12-19 DIAGNOSIS — M9903 Segmental and somatic dysfunction of lumbar region: Secondary | ICD-10-CM | POA: Diagnosis not present

## 2023-12-19 DIAGNOSIS — M9902 Segmental and somatic dysfunction of thoracic region: Secondary | ICD-10-CM | POA: Diagnosis not present

## 2023-12-19 DIAGNOSIS — M62830 Muscle spasm of back: Secondary | ICD-10-CM | POA: Diagnosis not present

## 2024-01-02 DIAGNOSIS — M62830 Muscle spasm of back: Secondary | ICD-10-CM | POA: Diagnosis not present

## 2024-01-02 DIAGNOSIS — M9903 Segmental and somatic dysfunction of lumbar region: Secondary | ICD-10-CM | POA: Diagnosis not present

## 2024-01-02 DIAGNOSIS — M9901 Segmental and somatic dysfunction of cervical region: Secondary | ICD-10-CM | POA: Diagnosis not present

## 2024-01-02 DIAGNOSIS — M9902 Segmental and somatic dysfunction of thoracic region: Secondary | ICD-10-CM | POA: Diagnosis not present

## 2024-01-03 DIAGNOSIS — N281 Cyst of kidney, acquired: Secondary | ICD-10-CM | POA: Diagnosis not present

## 2024-01-03 DIAGNOSIS — N200 Calculus of kidney: Secondary | ICD-10-CM | POA: Diagnosis not present

## 2024-01-03 DIAGNOSIS — R34 Anuria and oliguria: Secondary | ICD-10-CM | POA: Diagnosis not present

## 2024-01-13 ENCOUNTER — Ambulatory Visit (INDEPENDENT_AMBULATORY_CARE_PROVIDER_SITE_OTHER)

## 2024-01-13 VITALS — Ht 62.0 in | Wt 142.0 lb

## 2024-01-13 DIAGNOSIS — Z0000 Encounter for general adult medical examination without abnormal findings: Secondary | ICD-10-CM | POA: Diagnosis not present

## 2024-01-13 NOTE — Patient Instructions (Signed)
 [name] Page , Thank you for taking time to come for your Medicare Wellness Visit. I appreciate your ongoing commitment to your health goals. Please review the following plan we discussed and let me know if I can assist you in the future.   Referrals/Orders/Follow-Ups/Clinician Recommendations:  Next Medicare AWV: Jan 18, 2025 at 1:10 pm video visit.  Aim for 30 minutes of exercise or brisk walking, 6-8 glasses of water, and 5 servings of fruits and vegetables each day.  This is a list of the screening recommended for you and due dates:  Health Maintenance  Topic Date Due   COVID-19 Vaccine (3 - Moderna risk series) 10/14/2020   Zoster (Shingles) Vaccine (1 of 2) 03/13/2024*   Colon Cancer Screening  12/11/2024*   Mammogram  03/15/2024   Flu Shot  04/20/2024   Medicare Annual Wellness Visit  01/12/2025   DEXA scan (bone density measurement)  03/02/2025   DTaP/Tdap/Td vaccine (2 - Td or Tdap) 12/11/2033   Pneumonia Vaccine  Completed   Hepatitis C Screening  Completed   HPV Vaccine  Aged Out   Meningitis B Vaccine  Aged Out  *Topic was postponed. The date shown is not the original due date.    Advanced directives: (In Chart) A copy of your advanced directives are scanned into your chart should your provider ever need it. Advance Care Planning is important because it:  [x]  Makes sure you receive the medical care that is consistent with your values, goals, and preferences  [x]  It provides guidance to your family and loved ones and it also reduces their decisional burden about whether or not they are making the right decisions based on what you want done  Follow the link provided in your after visit summary or read over the paperwork we have mailed to you to help you started getting your Advance Directives in place. If you need assistance in completing these, please reach out to us  so that we can help you!   Next Medicare Annual Wellness Visit scheduled for next year: yes  Understanding  Your Risk for Falls Millions of people have serious injuries from falls each year. It is important to understand your risk of falling. Talk with your health care provider about your risk and what you can do to lower it. If you do have a serious fall, make sure to tell your provider. Falling once raises your risk of falling again. How can falls affect me? Serious injuries from falls are common. These include: Broken bones, such as hip fractures. Head injuries, such as traumatic brain injuries (TBI) or concussions. A fear of falling can cause you to avoid activities and stay at home. This can make your muscles weaker and raise your risk for a fall. What can increase my risk? There are a number of risk factors that increase your risk for falling. The more risk factors you have, the higher your risk of falling. Serious injuries from a fall happen most often to people who are older than 74 years old. Teenagers and young adults ages 64-29 are also at higher risk. Common risk factors include: Weakness in the lower body. Being generally weak or confused due to long-term (chronic) illness. Dizziness or balance problems. Poor vision. Medicines that cause dizziness or drowsiness. These may include: Medicines for your blood pressure, heart, anxiety, insomnia, or swelling (edema). Pain medicines. Muscle relaxants. Other risk factors include: Drinking alcohol . Having had a fall in the past. Having foot pain or wearing improper footwear. Working at  a dangerous job. Having any of the following in your home: Tripping hazards, such as floor clutter or loose rugs. Poor lighting. Pets. Having dementia or memory loss. What actions can I take to lower my risk of falling?     Physical activity Stay physically fit. Do strength and balance exercises. Consider taking a regular class to build strength and balance. Yoga and tai chi are good options. Vision Have your eyes checked every year and your  prescription for glasses or contacts updated as needed. Shoes and walking aids Wear non-skid shoes. Wear shoes that have rubber soles and low heels. Do not wear high heels. Do not walk around the house in socks or slippers. Use a cane or walker as told by your provider. Home safety Attach secure railings on both sides of your stairs. Install grab bars for your bathtub, shower, and toilet. Use a non-skid mat in your bathtub or shower. Attach bath mats securely with double-sided, non-slip rug tape. Use good lighting in all rooms. Keep a flashlight near your bed. Make sure there is a clear path from your bed to the bathroom. Use night-lights. Do not use throw rugs. Make sure all carpeting is taped or tacked down securely. Remove all clutter from walkways and stairways, including extension cords. Repair uneven or broken steps and floors. Avoid walking on icy or slippery surfaces. Walk on the grass instead of on icy or slick sidewalks. Use ice melter to get rid of ice on walkways in the winter. Use a cordless phone. Questions to ask your health care provider Can you help me check my risk for a fall? Do any of my medicines make me more likely to fall? Should I take a vitamin D  supplement? What exercises can I do to improve my strength and balance? Should I make an appointment to have my vision checked? Do I need a bone density test to check for weak bones (osteoporosis)? Would it help to use a cane or a walker? Where to find more information Centers for Disease Control and Prevention, STEADI: TonerPromos.no Community-Based Fall Prevention Programs: TonerPromos.no [employer] on Aging: BaseRingTones.pl Contact a health care provider if: You fall at home. You are afraid of falling at home. You feel weak, drowsy, or dizzy. This information is not intended to replace advice given to you by your health care provider. Make sure you discuss any questions you have with your health care provider. Document  Revised: 05/10/2022 Document Reviewed: 05/10/2022 Elsevier Patient Education  2024 [employer].

## 2024-01-13 NOTE — Progress Notes (Signed)
 Subjective:   [name] Page is a 73 y.o. who presents for a Medicare Wellness preventive visit.  Visit Complete: Virtual I connected with  Ahmed Hough on 01/13/24 by a audio enabled telemedicine application and verified that I am speaking with the correct person using two identifiers.  Patient Location: Home  Provider Location: Home Office  I discussed the limitations of evaluation and management by telemedicine. The patient expressed understanding and agreed to proceed.  Vital Signs: Because this visit was a virtual/telehealth visit, some criteria may be missing or patient reported. Any vitals not documented were not able to be obtained and vitals that have been documented are patient reported.  VideoDeclined- This patient declined Librarian, academic. Therefore the visit was completed with audio only.  Persons Participating in Visit: Patient.  AWV Questionnaire: Yes: Patient Medicare AWV questionnaire was completed by the patient on 01/09/2024; I have confirmed that all information answered by patient is correct and no changes since this date.  Cardiac Risk Factors include: advanced age (>88men, >74 women);dyslipidemia     Objective:    Today's Vitals   01/13/24 0840  Weight: 142 lb (64.4 kg)  Height: 5\' 2"  (1.575 m)  PainSc: 0-No pain   Body mass index is 25.97 kg/m.     01/13/2024    8:53 AM 01/10/2023    1:56 PM 04/05/2022   12:36 PM 01/06/2022    3:41 PM 06/25/2021    5:19 PM 10/01/2020    2:33 PM 09/17/2020    4:14 PM  Advanced Directives  Does Patient Have a Medical Advance Directive? Yes Yes Yes Yes Yes Yes Yes  Type of Estate agent of [place];Living will Living will;Healthcare Power of [employer] Power of [place];Living will   Living will  Does patient want to make changes to medical advance directive? No - Patient declined No - Patient declined     No - Patient declined  Copy of Healthcare Power of  Attorney in Chart? Yes - validated most recent copy scanned in chart (See row information) Yes - validated most recent copy scanned in chart (See row information)  Yes - validated most recent copy scanned in chart (See row information)       Current Medications (verified) Outpatient Encounter Medications as of 01/13/2024  Medication Sig   acetaminophen  (TYLENOL ) 500 MG tablet Take 500 mg by mouth every 6 (six) hours as needed.   albuterol  (VENTOLIN  HFA) 108 (90 Base) MCG/ACT inhaler Inhale 2 puffs into the lungs every 6 (six) hours as needed for wheezing or shortness of breath.   alendronate  (FOSAMAX ) 70 MG tablet Take 1 tablet (70 mg total) by mouth every 7 (seven) days.   azelastine  (ASTELIN ) 0.1 % nasal spray Place 1 spray into both nostrils 2 (two) times daily.   Calcium  Carbonate-Vit D-Min (CALCIUM  1200 PO) Take 1 tablet by mouth daily.   cyclobenzaprine  (FLEXERIL ) 5 MG tablet Take 1 tablet (5 mg total) by mouth 3 (three) times daily as needed for muscle spasms.   Glycerin -Hypromellose-PEG 400 0.2-0.2-1 % SOLN Apply 1 drop to eye 2 (two) times daily as needed (dry eyes).    ibuprofen  (ADVIL ) 600 MG tablet Take 1 tablet (600 mg total) by mouth every 8 (eight) hours as needed for moderate pain (pain score 4-6).   Multiple Vitamin (MULTIVITAMIN WITH MINERALS) TABS tablet Take 1 tablet by mouth daily.   rosuvastatin  (CRESTOR ) 10 MG tablet Take 1 tablet (10 mg total) by mouth daily.  No facility-administered encounter medications on file as of 01/13/2024.    Allergies (verified) Sulfa antibiotics   History: 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   sees Dr Fleurette Humbles   Cancer [facility])    melanoma   Dysuria    Frequency of urination    Hematuria    History of kidney stones    Meningioma (HCC) 09/04/2020   Osteoporosis    Renal calculus, bilateral    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  2020   Urgency of urination    Wears glasses    Past Surgical History:  Procedure Laterality Date   APPLICATION OF CRANIAL NAVIGATION Right 09/11/2020   Procedure: APPLICATION OF CRANIAL NAVIGATION;  Surgeon: Van Gelinas, MD;  Location: [facility] OR;  Service: Neurosurgery;  Laterality: Right;   BRAIN SURGERY     CRANIOTOMY Right 09/11/2020   Procedure: Right Pterional Craniotomy for resection of anterior skull base mass with Brain Lab;  Surgeon: Van Gelinas, MD;  Location: [facility] OR;  Service: Neurosurgery;  Laterality: Right;   CYSTOSCOPY WITH RETROGRADE PYELOGRAM, URETEROSCOPY AND STENT PLACEMENT Bilateral 08/11/2013   Procedure: CYSTOSCOPY WITH RETROGRADE PYELOGRAM, URETEROSCOPY AND STENT PLACEMENT;  Surgeon: Osborn Blaze, MD;  Location: [facility] ORS;  Service: Urology;  Laterality: Bilateral;   CYSTOSCOPY WITH URETEROSCOPY AND STENT PLACEMENT Bilateral 08/29/2013   Procedure: CYSTOSCOPY WITH BILATERAL URETEROSCOPY AND STENT REPLACEMENTS, stone extraction;  Surgeon: Osborn Blaze, MD;  Location: [facility];  Service: Urology;  Laterality: Bilateral;   CYSTOSCOPY/RETROGRADE/URETEROSCOPY/STONE EXTRACTION WITH BASKET Bilateral 01/31/2016   Procedure: CYSTOSCOPY/BILATERAL RETROGRADE PYELOGRAMS/BILATERAL URETEROSCOPY/BILATERAL STONE EXTRACTION WITH BASKET/RIGHT URETERAL STENT PLACEMENT;  Surgeon: Florencio Hunting, MD;  Location: [facility] OR[facility];  Service: Urology;  Laterality: Bilateral;   HEMIARTHROPLASTY SHOULDER FRACTURE Left 09/20/1990   HOLMIUM LASER APPLICATION Bilateral 08/29/2013   Procedure: HOLMIUM LASER APPLICATION;  Surgeon: Osborn Blaze, MD;  Location: [facility];  Service: Urology;  Laterality: Bilateral;   JOINT REPLACEMENT  1994   Family History  Problem Relation Age of Onset   Cancer Mother        unknown source, stomach and liver    Atrial fibrillation Father    Hyperlipidemia Father    Hypertension Father    Colon polyps Father    Arthritis Father     COPD Father    Vision loss Father    Hypertension Sister    Hyperlipidemia Sister    Thyroid  disease Sister    Esophageal cancer Neg Hx    Pancreatic cancer Neg Hx    Social History   Socioeconomic History   Marital status: Divorced    Spouse name: Not on file   Number of children: 1   Years of education: 12   Highest education level: 12th grade  Occupational History   Occupation: retired  Tobacco Use   Smoking status: Never   Smokeless tobacco: Never  Vaping Use   Vaping status: Never Used  Substance and Sexual Activity   Alcohol  use: No   Drug use: No   Sexual activity: Not Currently    Birth control/protection: Abstinence  Other Topics Concern   Not on file  Social History Narrative   Patient resides locally.  She is currently separated from her spouse.  She is living in a mobile home near her daughter and 2 grandchildren and her father.   Social Drivers  of Health   Financial Resource Strain: Low Risk  (01/13/2024)   Overall Financial Resource Strain (CARDIA)    Difficulty of Paying Living Expenses: Not hard at all  Food Insecurity: No Food Insecurity (01/13/2024)   Hunger Vital Sign    Worried About Running Out of Food in the Last Year: Never true    Ran Out of Food in the Last Year: Never true  Transportation Needs: No Transportation Needs (01/13/2024)   PRAPARE - Administrator, Civil Service (Medical): No    Lack of Transportation (Non-Medical): No  Physical Activity: Insufficiently Active (01/13/2024)   Exercise Vital Sign    Days of Exercise per Week: 3 days    Minutes of Exercise per Session: 40 min  Stress: No Stress Concern Present (01/13/2024)   [employer] of Occupational Health - Occupational Stress Questionnaire    Feeling of Stress : Not at all  Social Connections: Moderately Integrated (01/13/2024)   Social Connection and Isolation Panel [NHANES]    Frequency of Communication with Friends and Family: More than three times a week     Frequency of Social Gatherings with Friends and Family: More than three times a week    Attends Religious Services: More than 4 times per year    Active Member of [employer] or Organizations: Yes    Attends Engineer, structural: More than 4 times per year    Marital Status: Divorced    Tobacco Counseling Counseling given: Yes    Clinical Intake:  Pre-visit preparation completed: Yes  Pain : No/denies pain Pain Score: 0-No pain     BMI - recorded: 25.97 Nutritional Status: BMI 25 -29 Overweight Nutritional Risks: None Diabetes: No  No results found for: "HGBA1C"   How often do you need to have someone help you when you read instructions, pamphlets, or other written materials from your doctor or pharmacy?: 1 - Never  Interpreter Needed?: No  Information entered by :: Sally Crazier CMA   Activities of Daily Living     01/09/2024    6:36 PM  In your present state of health, do you have any difficulty performing the following activities:  Hearing? 0  Vision? 0  Difficulty concentrating or making decisions? 0  Walking or climbing stairs? 0  Dressing or bathing? 0  Doing errands, shopping? 0  Preparing Food and eating ? N  Using the Toilet? N  In the past six months, have you accidently leaked urine? N  Do you have problems with loss of bowel control? N  Managing your Medications? N  Managing your Finances? N  Housekeeping or managing your Housekeeping? N    Patient Care Team: Eliodoro Guerin, DO as PCP - General (Family Medicine) Secundino Dach Harvey Linen., MD as Consulting Physician (Urology) Eva Hikes, MD as Referring Physician (Dermatology) Erwin Heck as Physician Assistant (Chiropractic Medicine) Hyland Mailman, MD as Referring Physician (Optometry)  Indicate any recent Medical Services you may have received from other than [facility] providers in the past year (date may be approximate).     Assessment:   This is a routine wellness examination  for [place].  Hearing/Vision screen Hearing Screening - Comments:: Patient denies any hearing difficulties.   Vision Screening - Comments:: Wears rx glasses - up to date with routine eye exams  Patient sees Dr. Laddie Pickerel    Goals Addressed             This Visit's Progress  Patient Stated       I'd like to go to the  beach again maybe for a weekend       Depression Screen     01/13/2024    8:54 AM 01/10/2023    1:54 PM 11/03/2022    3:34 PM 01/08/2022    1:13 PM 01/06/2022    3:34 PM 07/20/2021   10:15 AM 05/06/2021    3:32 PM  PHQ 2/9 Scores  PHQ - 2 Score 0 0 0 0 0 0 0  PHQ- 9 Score 0  0    3    Fall Risk     01/09/2024    6:36 PM 01/10/2023    1:55 PM 01/06/2023    8:47 AM 11/03/2022    3:34 PM 01/08/2022    1:13 PM  Fall Risk   Falls in the past year? 0 1 1 0 0  Number falls in past yr: 0 0 0 0   Injury with Fall? 0 1 1 0   Risk for fall due to : No Fall Risks Impaired balance/gait;Impaired mobility  No Fall Risks   Follow up Falls prevention discussed;Falls evaluation completed Falls prevention discussed;Education provided;Falls evaluation completed  Education provided     MEDICARE RISK AT HOME:  Medicare Risk at Home Any stairs in or around the home?: (Patient-Rptd) No If so, are there any without handrails?: No Home free of loose throw rugs in walkways, pet beds, electrical cords, etc?: (Patient-Rptd) Yes Adequate lighting in your home to reduce risk of falls?: (Patient-Rptd) Yes Life alert?: (Patient-Rptd) No Use of a cane, walker or w/c?: (Patient-Rptd) No Grab bars in the bathroom?: (Patient-Rptd) No Shower chair or bench in shower?: (Patient-Rptd) No Elevated toilet seat or a handicapped toilet?: (Patient-Rptd) No  TIMED UP AND GO:  Was the test performed?  No  Cognitive Function: 6CIT completed    08/23/2018    9:28 AM  MMSE - Mini Mental State Exam  Orientation to time 5  Orientation to Place 5  Registration 3  Attention/  Calculation 5  Recall 2  Language- name 2 objects 2  Language- repeat 1  Language- follow 3 step command 3  Language- read & follow direction 1  Write a sentence 1  Copy design 1  Total score 29        01/13/2024    8:53 AM 01/10/2023    1:56 PM 01/06/2022    3:35 PM 09/11/2019   10:55 AM  6CIT Screen  What Year? 0 points 0 points 0 points 0 points  What month? 0 points 0 points 0 points 0 points  What time? 0 points 0 points 0 points 0 points  Count back from 20 0 points 0 points 0 points 0 points  Months in reverse 0 points 0 points 2 points 0 points  Repeat phrase 0 points 0 points 0 points 0 points  Total Score 0 points 0 points 2 points 0 points    Immunizations Immunization History  Administered Date(s) Administered   Fluad Quad(high Dose 65+) 06/27/2019, 07/18/2020, 07/20/2021, 06/09/2022   Hepatitis B 04/21/1994   Influenza, High Dose Seasonal PF 08/08/2015, 07/07/2018   Influenza,inj,Quad PF,6+ Mos 08/11/2013   Influenza-Unspecified 06/05/2014, 07/02/2023   Moderna SARS-COV2 Booster Vaccination 09/16/2020, 09/16/2020   Moderna Sars-Covid-2 Vaccination 10/13/2019, 11/15/2019   Pneumococcal Conjugate-13 10/30/2015   Pneumococcal Polysaccharide-23 08/14/2018   Tdap 12/12/2023    Screening Tests Health Maintenance  Topic Date Due   COVID-19 Vaccine (3 - Moderna  risk series) 10/14/2020   Zoster Vaccines- Shingrix (1 of 2) 03/13/2024 (Originally 01/31/1970)   Colonoscopy  12/11/2024 (Originally 09/21/2019)   MAMMOGRAM  03/15/2024   INFLUENZA VACCINE  04/20/2024   Medicare Annual Wellness (AWV)  01/12/2025   DEXA SCAN  03/02/2025   DTaP/Tdap/Td (2 - Td or Tdap) 12/11/2033   Pneumonia Vaccine 39+ Years old  Completed   Hepatitis C Screening  Completed   HPV VACCINES  Aged Out   Meningococcal B Vaccine  Aged Out    Health Maintenance  Health Maintenance Due  Topic Date Due   COVID-19 Vaccine (3 - Moderna risk series) 10/14/2020   Health Maintenance Items  Addressed: Patient is scheduled to have her yearly mammogram on March 08, 2024 at [facility]  Additional Screening:  Vision Screening: Recommended annual ophthalmology exams for early detection of glaucoma and other disorders of the eye. Patient sees Dr Olin Bertin at [facility] in [place] Dental Screening: Recommended annual dental exams for proper oral hygiene  Community Resource Referral / Chronic Care Management: CRR required this visit?  No   CCM required this visit?  No     Plan:     I have personally reviewed and noted the following in the patient's chart:   Medical and social history Use of alcohol , tobacco or illicit drugs  Current medications and supplements including opioid prescriptions. Patient is not currently taking opioid prescriptions. Functional ability and status Nutritional status Physical activity Advanced directives List of other physicians Hospitalizations, surgeries, and ER visits in previous 12 months Vitals Screenings to include cognitive, depression, and falls Referrals and appointments  In addition, I have reviewed and discussed with patient certain preventive protocols, quality metrics, and best practice recommendations. A written personalized care plan for preventive services as well as general preventive health recommendations were provided to patient.     Nyshaun Standage Lyndel Dancel, CMA   01/13/2024   After Visit Summary: (MyChart) Due to this being a telephonic visit, the after visit summary with patients personalized plan was offered to patient via MyChart   Notes: Nothing significant to report at this time.

## 2024-01-16 DIAGNOSIS — M62830 Muscle spasm of back: Secondary | ICD-10-CM | POA: Diagnosis not present

## 2024-01-16 DIAGNOSIS — M9902 Segmental and somatic dysfunction of thoracic region: Secondary | ICD-10-CM | POA: Diagnosis not present

## 2024-01-16 DIAGNOSIS — M9901 Segmental and somatic dysfunction of cervical region: Secondary | ICD-10-CM | POA: Diagnosis not present

## 2024-01-16 DIAGNOSIS — M9903 Segmental and somatic dysfunction of lumbar region: Secondary | ICD-10-CM | POA: Diagnosis not present

## 2024-01-23 DIAGNOSIS — R229 Localized swelling, mass and lump, unspecified: Secondary | ICD-10-CM | POA: Diagnosis not present

## 2024-01-23 DIAGNOSIS — Z85828 Personal history of other malignant neoplasm of skin: Secondary | ICD-10-CM | POA: Diagnosis not present

## 2024-01-23 DIAGNOSIS — Z08 Encounter for follow-up examination after completed treatment for malignant neoplasm: Secondary | ICD-10-CM | POA: Diagnosis not present

## 2024-01-23 DIAGNOSIS — D0461 Carcinoma in situ of skin of right upper limb, including shoulder: Secondary | ICD-10-CM | POA: Diagnosis not present

## 2024-01-23 DIAGNOSIS — D485 Neoplasm of uncertain behavior of skin: Secondary | ICD-10-CM | POA: Diagnosis not present

## 2024-01-23 DIAGNOSIS — D0472 Carcinoma in situ of skin of left lower limb, including hip: Secondary | ICD-10-CM | POA: Diagnosis not present

## 2024-01-23 DIAGNOSIS — C44722 Squamous cell carcinoma of skin of right lower limb, including hip: Secondary | ICD-10-CM | POA: Diagnosis not present

## 2024-01-23 DIAGNOSIS — C44519 Basal cell carcinoma of skin of other part of trunk: Secondary | ICD-10-CM | POA: Diagnosis not present

## 2024-01-23 DIAGNOSIS — C44622 Squamous cell carcinoma of skin of right upper limb, including shoulder: Secondary | ICD-10-CM | POA: Diagnosis not present

## 2024-01-23 DIAGNOSIS — L905 Scar conditions and fibrosis of skin: Secondary | ICD-10-CM | POA: Diagnosis not present

## 2024-01-23 DIAGNOSIS — C4359 Malignant melanoma of other part of trunk: Secondary | ICD-10-CM | POA: Diagnosis not present

## 2024-01-23 DIAGNOSIS — L570 Actinic keratosis: Secondary | ICD-10-CM | POA: Diagnosis not present

## 2024-01-23 DIAGNOSIS — L578 Other skin changes due to chronic exposure to nonionizing radiation: Secondary | ICD-10-CM | POA: Diagnosis not present

## 2024-01-23 DIAGNOSIS — Z85820 Personal history of malignant melanoma of skin: Secondary | ICD-10-CM | POA: Diagnosis not present

## 2024-01-23 DIAGNOSIS — L244 Irritant contact dermatitis due to drugs in contact with skin: Secondary | ICD-10-CM | POA: Diagnosis not present

## 2024-01-30 DIAGNOSIS — M62830 Muscle spasm of back: Secondary | ICD-10-CM | POA: Diagnosis not present

## 2024-01-30 DIAGNOSIS — M9901 Segmental and somatic dysfunction of cervical region: Secondary | ICD-10-CM | POA: Diagnosis not present

## 2024-01-30 DIAGNOSIS — M9902 Segmental and somatic dysfunction of thoracic region: Secondary | ICD-10-CM | POA: Diagnosis not present

## 2024-01-30 DIAGNOSIS — M9903 Segmental and somatic dysfunction of lumbar region: Secondary | ICD-10-CM | POA: Diagnosis not present

## 2024-02-14 ENCOUNTER — Ambulatory Visit (INDEPENDENT_AMBULATORY_CARE_PROVIDER_SITE_OTHER): Admitting: Family Medicine

## 2024-02-14 ENCOUNTER — Encounter: Payer: Self-pay | Admitting: Family Medicine

## 2024-02-14 VITALS — BP 137/76 | HR 75 | Temp 98.5°F | Ht 62.0 in | Wt 144.0 lb

## 2024-02-14 DIAGNOSIS — D509 Iron deficiency anemia, unspecified: Secondary | ICD-10-CM | POA: Diagnosis not present

## 2024-02-14 DIAGNOSIS — M2550 Pain in unspecified joint: Secondary | ICD-10-CM | POA: Diagnosis not present

## 2024-02-14 DIAGNOSIS — Z1211 Encounter for screening for malignant neoplasm of colon: Secondary | ICD-10-CM

## 2024-02-14 NOTE — Progress Notes (Signed)
 Subjective: CC: Follow-up polyarthralgia, allergic rhinitis PCP: Eliodoro Guerin, DO AOZ:HYQMVH [name] Page is a 73 y.o. female presenting to clinic today for:  1.  Polyarthralgia Patient is utilizing the ibuprofen  a few times per week and typically only at bedtime.  Her joint pain is well-controlled currently.  She reports no chest pain, shortness of breath, swelling, headache or GI symptom.  Certainly no GI bleeding.  2.  Iron deficiency She reports that she was able to take ferrous sulfate  for about 1 month before coming off of it because she was having increasing frequency of stools and some nausea.  She has been trying to focus on iron rich foods instead to maintain iron levels.  Would like to have this rechecked today   ROS: Per HPI  Allergies  Allergen Reactions   Sulfa Antibiotics Rash   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   sees Dr Fleurette Humbles   Cancer [facility])    melanoma   Dysuria    Frequency of urination    Hematuria    History of kidney stones    Meningioma (HCC) 09/04/2020   Osteoporosis    Renal calculus, bilateral    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   Urgency of urination    Wears glasses     Current Outpatient Medications:    acetaminophen  (TYLENOL ) 500 MG tablet, Take 500 mg by mouth every 6 (six) hours as needed., Disp: , Rfl:    albuterol  (VENTOLIN  HFA) 108 (90 Base) MCG/ACT inhaler, Inhale 2 puffs into the lungs every 6 (six) hours as needed for wheezing or shortness of breath., Disp: 8 g, Rfl: 0   alendronate  (FOSAMAX ) 70 MG tablet, Take 1 tablet (70 mg total) by mouth every 7 (seven) days., Disp: 12 tablet, Rfl: 4   azelastine  (ASTELIN ) 0.1 % nasal spray, Place 1 spray into both nostrils 2 (two) times daily., Disp: 30 mL, Rfl: 12   Calcium  Carbonate-Vit D-Min (CALCIUM  1200 PO), Take 1 tablet by mouth daily., Disp: , Rfl:    cyclobenzaprine   (FLEXERIL ) 5 MG tablet, Take 1 tablet (5 mg total) by mouth 3 (three) times daily as needed for muscle spasms., Disp: 30 tablet, Rfl: 0   fluorouracil (EFUDEX) 5 % cream, Apply topically 2 (two) times a week., Disp: , Rfl:    Glycerin -Hypromellose-PEG 400 0.2-0.2-1 % SOLN, Apply 1 drop to eye 2 (two) times daily as needed (dry eyes). , Disp: , Rfl:    ibuprofen  (ADVIL ) 600 MG tablet, Take 1 tablet (600 mg total) by mouth every 8 (eight) hours as needed for moderate pain (pain score 4-6)., Disp: 90 tablet, Rfl: 0   indapamide (LOZOL) 1.25 MG tablet, Take 1.25 mg by mouth daily., Disp: , Rfl:    Multiple Vitamin (MULTIVITAMIN WITH MINERALS) TABS tablet, Take 1 tablet by mouth daily., Disp: , Rfl:    rosuvastatin  (CRESTOR ) 10 MG tablet, Take 1 tablet (10 mg total) by mouth daily., Disp: 90 tablet, Rfl: 3 Social History   Socioeconomic History   Marital status: Divorced    Spouse name: Not on file   Number of children: 1   Years of education: 12   Highest education level: 12th grade  Occupational History   Occupation: retired  Tobacco Use   Smoking status: Never   Smokeless tobacco: Never  Building services engineer  status: Never Used  Substance and Sexual Activity   Alcohol  use: No   Drug use: No   Sexual activity: Not Currently    Birth control/protection: Abstinence  Other Topics Concern   Not on file  Social History Narrative   Patient resides locally.  She is currently separated from her spouse.  She is living in a mobile home near her daughter and 2 grandchildren and her father.   Social Drivers of Corporate investment banker Strain: Low Risk  (01/13/2024)   Overall Financial Resource Strain (CARDIA)    Difficulty of Paying Living Expenses: Not hard at all  Food Insecurity: No Food Insecurity (01/13/2024)   Hunger Vital Sign    Worried About Running Out of Food in the Last Year: Never true    Ran Out of Food in the Last Year: Never true  Transportation Needs: No Transportation Needs  (01/13/2024)   PRAPARE - Administrator, Civil Service (Medical): No    Lack of Transportation (Non-Medical): No  Physical Activity: Insufficiently Active (01/13/2024)   Exercise Vital Sign    Days of Exercise per Week: 3 days    Minutes of Exercise per Session: 40 min  Stress: No Stress Concern Present (01/13/2024)   [employer] of Occupational Health - Occupational Stress Questionnaire    Feeling of Stress : Not at all  Social Connections: Moderately Integrated (01/13/2024)   Social Connection and Isolation Panel [NHANES]    Frequency of Communication with Friends and Family: More than three times a week    Frequency of Social Gatherings with Friends and Family: More than three times a week    Attends Religious Services: More than 4 times per year    Active Member of [employer] or Organizations: Yes    Attends Engineer, structural: More than 4 times per year    Marital Status: Divorced  Intimate Partner Violence: Not At Risk (01/13/2024)   Humiliation, Afraid, Rape, and Kick questionnaire    Fear of Current or Ex-Partner: No    Emotionally Abused: No    Physically Abused: No    Sexually Abused: No   Family History  Problem Relation Age of Onset   Cancer Mother        unknown source, stomach and liver    Atrial fibrillation Father    Hyperlipidemia Father    Hypertension Father    Colon polyps Father    Arthritis Father    COPD Father    Vision loss Father    Hypertension Sister    Hyperlipidemia Sister    Thyroid  disease Sister    Esophageal cancer Neg Hx    Pancreatic cancer Neg Hx     Objective: Office vital signs reviewed. BP 137/76   Pulse 75   Temp 98.5 F (36.9 C)   Ht 5\' 2"  (1.575 m)   Wt 144 lb (65.3 kg)   SpO2 95%   BMI 26.34 kg/m   Physical Examination:  General: Awake, alert, well nourished, No acute distress HEENT: Sclera white.  Moist mucous membranes Cardio: regular rate and rhythm, S1S2 heard, no murmurs appreciated Pulm:  clear to auscultation bilaterally, no wheezes, rhonchi or rales; normal work of breathing on room air MSK: Increased kyphosis of thoracic spine with flattening of the cervical spine.  Limited extension of the cervical spine and thoracic spine.  She is ambulating independently with normal gait and station.  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 female  Polyarthralgia - Plan: Basic Metabolic Panel  Screening for colon cancer - Plan: Cologuard  Iron deficiency anemia, unspecified iron deficiency anemia type - Plan: Iron, TIBC and Ferritin Panel  Polyarthralgia is chronic and stable.  Check renal function given intermittent use of NSAID  Cologuard ordered per her request for colon cancer screening.  No red flag signs or symptoms  Recheck iron.  If iron still low, could consider Slow Fe Rx.  She was amenable to this   Eliodoro Guerin, DO [facility] [phone]

## 2024-02-15 ENCOUNTER — Ambulatory Visit: Payer: Self-pay | Admitting: Family Medicine

## 2024-02-15 ENCOUNTER — Telehealth: Payer: Self-pay

## 2024-02-15 DIAGNOSIS — M62830 Muscle spasm of back: Secondary | ICD-10-CM | POA: Diagnosis not present

## 2024-02-15 DIAGNOSIS — M9902 Segmental and somatic dysfunction of thoracic region: Secondary | ICD-10-CM | POA: Diagnosis not present

## 2024-02-15 DIAGNOSIS — M9903 Segmental and somatic dysfunction of lumbar region: Secondary | ICD-10-CM | POA: Diagnosis not present

## 2024-02-15 DIAGNOSIS — M9901 Segmental and somatic dysfunction of cervical region: Secondary | ICD-10-CM | POA: Diagnosis not present

## 2024-02-15 LAB — BASIC METABOLIC PANEL WITH GFR
BUN/Creatinine Ratio: 19 (ref 12–28)
BUN: 21 mg/dL (ref 8–27)
CO2: 22 mmol/L (ref 20–29)
Calcium: 9.7 mg/dL (ref 8.7–10.3)
Chloride: 106 mmol/L (ref 96–106)
Creatinine, Ser: 1.12 mg/dL — ABNORMAL HIGH (ref 0.57–1.00)
Glucose: 79 mg/dL (ref 70–99)
Potassium: 4.4 mmol/L (ref 3.5–5.2)
Sodium: 143 mmol/L (ref 134–144)
eGFR: 52 mL/min/{1.73_m2} — ABNORMAL LOW (ref >59)

## 2024-02-15 LAB — IRON,TIBC AND FERRITIN PANEL
Ferritin: 20 ng/mL (ref 15–150)
Iron Saturation: 25 % (ref 15–55)
Iron: 80 ug/dL (ref 27–139)
Total Iron Binding Capacity: 318 ug/dL (ref 250–450)
UIBC: 238 ug/dL (ref 118–369)

## 2024-02-15 NOTE — Telephone Encounter (Signed)
 Copied from CRM [id number]. Topic: Clinical - Lab/Test Results >> Feb 15, 2024  1:28 PM Ary Bitter R wrote: Reason for CRM: Pt results have been relayed. She does have the question of what does an acute kidney injury mean? Please contact pt back. [phone]

## 2024-02-17 NOTE — Telephone Encounter (Signed)
 Pt advised of provider feedback and pt voiced understanding.

## 2024-02-17 NOTE — Telephone Encounter (Signed)
 This means her kidney function is LESS than normal.  Typically caused by use of NSAIDs or inadequate hydration

## 2024-02-24 DIAGNOSIS — D0461 Carcinoma in situ of skin of right upper limb, including shoulder: Secondary | ICD-10-CM | POA: Diagnosis not present

## 2024-02-27 DIAGNOSIS — Z1211 Encounter for screening for malignant neoplasm of colon: Secondary | ICD-10-CM | POA: Diagnosis not present

## 2024-02-29 DIAGNOSIS — C44519 Basal cell carcinoma of skin of other part of trunk: Secondary | ICD-10-CM | POA: Diagnosis not present

## 2024-03-06 LAB — COLOGUARD: COLOGUARD: NEGATIVE

## 2024-03-07 DIAGNOSIS — D0472 Carcinoma in situ of skin of left lower limb, including hip: Secondary | ICD-10-CM | POA: Diagnosis not present

## 2024-03-07 DIAGNOSIS — I872 Venous insufficiency (chronic) (peripheral): Secondary | ICD-10-CM | POA: Diagnosis not present

## 2024-04-02 DIAGNOSIS — M9901 Segmental and somatic dysfunction of cervical region: Secondary | ICD-10-CM | POA: Diagnosis not present

## 2024-04-02 DIAGNOSIS — M62830 Muscle spasm of back: Secondary | ICD-10-CM | POA: Diagnosis not present

## 2024-04-02 DIAGNOSIS — M9903 Segmental and somatic dysfunction of lumbar region: Secondary | ICD-10-CM | POA: Diagnosis not present

## 2024-04-02 DIAGNOSIS — M9902 Segmental and somatic dysfunction of thoracic region: Secondary | ICD-10-CM | POA: Diagnosis not present

## 2024-04-09 DIAGNOSIS — M62830 Muscle spasm of back: Secondary | ICD-10-CM | POA: Diagnosis not present

## 2024-04-09 DIAGNOSIS — M9902 Segmental and somatic dysfunction of thoracic region: Secondary | ICD-10-CM | POA: Diagnosis not present

## 2024-04-09 DIAGNOSIS — M9903 Segmental and somatic dysfunction of lumbar region: Secondary | ICD-10-CM | POA: Diagnosis not present

## 2024-04-09 DIAGNOSIS — M9901 Segmental and somatic dysfunction of cervical region: Secondary | ICD-10-CM | POA: Diagnosis not present

## 2024-04-23 DIAGNOSIS — M9901 Segmental and somatic dysfunction of cervical region: Secondary | ICD-10-CM | POA: Diagnosis not present

## 2024-04-23 DIAGNOSIS — M9903 Segmental and somatic dysfunction of lumbar region: Secondary | ICD-10-CM | POA: Diagnosis not present

## 2024-04-23 DIAGNOSIS — M62830 Muscle spasm of back: Secondary | ICD-10-CM | POA: Diagnosis not present

## 2024-04-23 DIAGNOSIS — M9902 Segmental and somatic dysfunction of thoracic region: Secondary | ICD-10-CM | POA: Diagnosis not present

## 2024-05-07 DIAGNOSIS — M9901 Segmental and somatic dysfunction of cervical region: Secondary | ICD-10-CM | POA: Diagnosis not present

## 2024-05-07 DIAGNOSIS — M62830 Muscle spasm of back: Secondary | ICD-10-CM | POA: Diagnosis not present

## 2024-05-07 DIAGNOSIS — M9902 Segmental and somatic dysfunction of thoracic region: Secondary | ICD-10-CM | POA: Diagnosis not present

## 2024-05-07 DIAGNOSIS — M9903 Segmental and somatic dysfunction of lumbar region: Secondary | ICD-10-CM | POA: Diagnosis not present

## 2024-05-16 DIAGNOSIS — C44729 Squamous cell carcinoma of skin of left lower limb, including hip: Secondary | ICD-10-CM | POA: Diagnosis not present

## 2024-05-16 DIAGNOSIS — L570 Actinic keratosis: Secondary | ICD-10-CM | POA: Diagnosis not present

## 2024-05-16 DIAGNOSIS — L821 Other seborrheic keratosis: Secondary | ICD-10-CM | POA: Diagnosis not present

## 2024-05-16 DIAGNOSIS — Z85820 Personal history of malignant melanoma of skin: Secondary | ICD-10-CM | POA: Diagnosis not present

## 2024-05-16 DIAGNOSIS — D225 Melanocytic nevi of trunk: Secondary | ICD-10-CM | POA: Diagnosis not present

## 2024-05-16 DIAGNOSIS — L814 Other melanin hyperpigmentation: Secondary | ICD-10-CM | POA: Diagnosis not present

## 2024-05-16 DIAGNOSIS — Z08 Encounter for follow-up examination after completed treatment for malignant neoplasm: Secondary | ICD-10-CM | POA: Diagnosis not present

## 2024-05-16 DIAGNOSIS — R229 Localized swelling, mass and lump, unspecified: Secondary | ICD-10-CM | POA: Diagnosis not present

## 2024-05-16 DIAGNOSIS — C44529 Squamous cell carcinoma of skin of other part of trunk: Secondary | ICD-10-CM | POA: Diagnosis not present

## 2024-05-16 DIAGNOSIS — Z85828 Personal history of other malignant neoplasm of skin: Secondary | ICD-10-CM | POA: Diagnosis not present

## 2024-05-16 DIAGNOSIS — C44622 Squamous cell carcinoma of skin of right upper limb, including shoulder: Secondary | ICD-10-CM | POA: Diagnosis not present

## 2024-05-16 DIAGNOSIS — C44722 Squamous cell carcinoma of skin of right lower limb, including hip: Secondary | ICD-10-CM | POA: Diagnosis not present

## 2024-05-23 ENCOUNTER — Telehealth: Admitting: Family Medicine

## 2024-05-23 DIAGNOSIS — J069 Acute upper respiratory infection, unspecified: Secondary | ICD-10-CM | POA: Diagnosis not present

## 2024-05-23 MED ORDER — BENZONATATE 100 MG PO CAPS
100.0000 mg | ORAL_CAPSULE | Freq: Three times a day (TID) | ORAL | 0 refills | Status: DC | PRN
Start: 2024-05-23 — End: 2024-08-10

## 2024-05-23 NOTE — Patient Instructions (Addendum)
 Lolita SHAUNNA Molt, thank you for joining Chiquita CHRISTELLA Barefoot, NP for today's virtual visit.  While this provider is not your primary care provider (PCP), if your PCP is located in our provider database this encounter information will be shared with them immediately following your visit.   A [facility] MyChart account gives you access to today's visit and all your visits, tests, and labs performed at [facility]  click here if you don't have a [facility] MyChart account or go to [email or web address]  Consent: (Patient) Lolita SHAUNNA Molt provided verbal consent for this virtual visit at the beginning of the encounter.  Current Medications:  Current Outpatient Medications:    benzonatate  (TESSALON ) 100 MG capsule, Take 1 capsule (100 mg total) by mouth 3 (three) times daily as needed for cough., Disp: 20 capsule, Rfl: 0   acetaminophen  (TYLENOL ) 500 MG tablet, Take 500 mg by mouth every 6 (six) hours as needed., Disp: , Rfl:    albuterol  (VENTOLIN  HFA) 108 (90 Base) MCG/ACT inhaler, Inhale 2 puffs into the lungs every 6 (six) hours as needed for wheezing or shortness of breath., Disp: 8 g, Rfl: 0   alendronate  (FOSAMAX ) 70 MG tablet, Take 1 tablet (70 mg total) by mouth every 7 (seven) days., Disp: 12 tablet, Rfl: 4   azelastine  (ASTELIN ) 0.1 % nasal spray, Place 1 spray into both nostrils 2 (two) times daily., Disp: 30 mL, Rfl: 12   Calcium  Carbonate-Vit D-Min (CALCIUM  1200 PO), Take 1 tablet by mouth daily., Disp: , Rfl:    cyclobenzaprine  (FLEXERIL ) 5 MG tablet, Take 1 tablet (5 mg total) by mouth 3 (three) times daily as needed for muscle spasms., Disp: 30 tablet, Rfl: 0   fluorouracil (EFUDEX) 5 % cream, Apply topically 2 (two) times a week., Disp: , Rfl:    Glycerin -Hypromellose-PEG 400 0.2-0.2-1 % SOLN, Apply 1 drop to eye 2 (two) times daily as needed (dry eyes). , Disp: , Rfl:    ibuprofen  (ADVIL ) 600 MG tablet, Take 1 tablet (600 mg total) by mouth every 8 (eight) hours as needed  for moderate pain (pain score 4-6)., Disp: 90 tablet, Rfl: 0   indapamide (LOZOL) 1.25 MG tablet, Take 1.25 mg by mouth daily., Disp: , Rfl:    Multiple Vitamin (MULTIVITAMIN WITH MINERALS) TABS tablet, Take 1 tablet by mouth daily., Disp: , Rfl:    rosuvastatin  (CRESTOR ) 10 MG tablet, Take 1 tablet (10 mg total) by mouth daily., Disp: 90 tablet, Rfl: 3   Medications ordered in this encounter:  Meds ordered this encounter  Medications   benzonatate  (TESSALON ) 100 MG capsule    Sig: Take 1 capsule (100 mg total) by mouth 3 (three) times daily as needed for cough.    Dispense:  20 capsule    Refill:  0    Supervising Provider:   BLAISE ALEENE KIDD [[id number]]     *If you need refills on other medications prior to your next appointment, please contact your pharmacy*  Follow-Up: Call back or seek an in-person evaluation if the symptoms worsen or if the condition fails to improve as anticipated.  [facility] [phone]  Other Instructions  URI recommendations: -restart nasal spray as well - Increased rest - Increasing Fluids - Acetaminophen  / ibuprofen  as needed for fever/pain.  - Salt water gargling, chloraseptic spray and throat lozenges - Saline nasal spray if congestion or if nasal passages feel dry. - Humidifying the air.     If you have been instructed to have  an in-person evaluation today at a local Urgent Care facility, please use the link below. It will take you to a list of all of our available [facility], including address, phone number and hours of operation. Please do not delay care.  [facility]  If you or a family member do not have a primary care provider, use the link below to schedule a visit and establish care. When you choose a [facility] primary care physician or advanced practice provider, you gain a long-term partner in health. Find a Primary Care Provider  Learn more about [facility]'s in-office and virtual care  options: [facility] - Get Care Now

## 2024-05-23 NOTE — Progress Notes (Signed)
 Virtual Visit Consent   [name] Page, you are scheduled for a virtual visit with a [facility] provider today. Just as with appointments in the office, your consent must be obtained to participate. Your consent will be active for this visit and any virtual visit you may have with one of our providers in the next 365 days. If you have a MyChart account, a copy of this consent can be sent to you electronically.  As this is a virtual visit, video technology does not allow for your provider to perform a traditional examination. This may limit your provider's ability to fully assess your condition. If your provider identifies any concerns that need to be evaluated in person or the need to arrange testing (such as labs, EKG, etc.), we will make arrangements to do so. Although advances in technology are sophisticated, we cannot ensure that it will always work on either your end or our end. If the connection with a video visit is poor, the visit may have to be switched to a telephone visit. With either a video or telephone visit, we are not always able to ensure that we have a secure connection.  By engaging in this virtual visit, you consent to the provision of healthcare and authorize for your insurance to be billed (if applicable) for the services provided during this visit. Depending on your insurance coverage, you may receive a charge related to this service.  I need to obtain your verbal consent now. Are you willing to proceed with your visit today? [name] Page has provided verbal consent on 05/23/2024 for a virtual visit (video or telephone). Chiquita CHRISTELLA Barefoot, NP  Date: 05/23/2024 3:42 PM   Virtual Visit via Video Note   I, Chiquita CHRISTELLA Barefoot, connected with  [name] Page  ([id number], Dec 18, 1950) on 05/23/24 at  3:45 PM EDT by a video-enabled telemedicine application and verified that I am speaking with the correct person using two identifiers.  Location: Patient: Virtual Visit Location Patient:  Home Provider: Virtual Visit Location Provider: Home Office   I discussed the limitations of evaluation and management by telemedicine and the availability of in person appointments. The patient expressed understanding and agreed to proceed.    History of Present Illness: [name] Page is a 73 y.o. who identifies as a female who was assigned female at birth, and is being seen today for sore throat  Onset was Monday night sore throat that progressed to congestion and coughing over last two days  Associated symptoms are shortness of breath related to stuffiness, post nasal drainage Modifying factors are cough drops and mucinex drops, motrin  for headache and fluids Denies chest pain, fevers, chills, ear pain  Exposure to sick contacts- unknown COVID test:  no   Problems:  Patient Active Problem List   Diagnosis Date Noted   History of resection of meningioma 11/02/2020   Peripheral edema    Hypoalbuminemia due to protein-calorie malnutrition (HCC)    Transaminitis    Tremor    Iron deficiency anemia    CKD (chronic kidney disease), stage III (HCC) 09/10/2020   Vasogenic Cerebral edema (HCC) 09/04/2020   Cancer of skin, squamous cell 09/04/2020   Nephrolithiasis 09/04/2020   Essential hypertension 11/01/2015   Osteoporosis 10/30/2015    Allergies:  Allergies  Allergen Reactions   Sulfa Antibiotics Rash   Medications:  Current Outpatient Medications:    acetaminophen  (TYLENOL ) 500 MG tablet, Take 500 mg by mouth every 6 (six) hours as needed., Disp: ,  Rfl:    albuterol  (VENTOLIN  HFA) 108 (90 Base) MCG/ACT inhaler, Inhale 2 puffs into the lungs every 6 (six) hours as needed for wheezing or shortness of breath., Disp: 8 g, Rfl: 0   alendronate  (FOSAMAX ) 70 MG tablet, Take 1 tablet (70 mg total) by mouth every 7 (seven) days., Disp: 12 tablet, Rfl: 4   azelastine  (ASTELIN ) 0.1 % nasal spray, Place 1 spray into both nostrils 2 (two) times daily., Disp: 30 mL, Rfl: 12   Calcium   Carbonate-Vit D-Min (CALCIUM  1200 PO), Take 1 tablet by mouth daily., Disp: , Rfl:    cyclobenzaprine  (FLEXERIL ) 5 MG tablet, Take 1 tablet (5 mg total) by mouth 3 (three) times daily as needed for muscle spasms., Disp: 30 tablet, Rfl: 0   fluorouracil (EFUDEX) 5 % cream, Apply topically 2 (two) times a week., Disp: , Rfl:    Glycerin -Hypromellose-PEG 400 0.2-0.2-1 % SOLN, Apply 1 drop to eye 2 (two) times daily as needed (dry eyes). , Disp: , Rfl:    ibuprofen  (ADVIL ) 600 MG tablet, Take 1 tablet (600 mg total) by mouth every 8 (eight) hours as needed for moderate pain (pain score 4-6)., Disp: 90 tablet, Rfl: 0   indapamide (LOZOL) 1.25 MG tablet, Take 1.25 mg by mouth daily., Disp: , Rfl:    Multiple Vitamin (MULTIVITAMIN WITH MINERALS) TABS tablet, Take 1 tablet by mouth daily., Disp: , Rfl:    rosuvastatin  (CRESTOR ) 10 MG tablet, Take 1 tablet (10 mg total) by mouth daily., Disp: 90 tablet, Rfl: 3  Observations/Objective: Patient is well-developed, well-nourished in no acute distress.  Resting comfortably  at home.  Head is normocephalic, atraumatic.  No labored breathing.  Speech is clear and coherent with logical content.  Patient is alert and oriented at baseline.    Assessment and Plan:  1. Viral URI with cough (Primary)  - benzonatate  (TESSALON ) 100 MG capsule; Take 1 capsule (100 mg total) by mouth 3 (three) times daily as needed for cough.  Dispense: 20 capsule; Refill: 0  URI recommendations: -get nasal spray back on board as well - Increased rest - Increasing Fluids - Acetaminophen  / ibuprofen  as needed for fever/pain.  - Salt water gargling, chloraseptic spray and throat lozenges - Saline nasal spray if congestion or if nasal passages feel dry. - Humidifying the air.     Follow Up Instructions: I discussed the assessment and treatment plan with the patient. The patient was provided an opportunity to ask questions and all were answered. The patient agreed with the  plan and demonstrated an understanding of the instructions.  A copy of instructions were sent to the patient via MyChart unless otherwise noted below.    The patient was advised to call back or seek an in-person evaluation if the symptoms worsen or if the condition fails to improve as anticipated.    Chiquita CHRISTELLA Barefoot, NP

## 2024-05-28 ENCOUNTER — Ambulatory Visit (INDEPENDENT_AMBULATORY_CARE_PROVIDER_SITE_OTHER)

## 2024-05-28 ENCOUNTER — Ambulatory Visit (INDEPENDENT_AMBULATORY_CARE_PROVIDER_SITE_OTHER): Admitting: Family Medicine

## 2024-05-28 ENCOUNTER — Ambulatory Visit: Payer: Self-pay | Admitting: Family Medicine

## 2024-05-28 ENCOUNTER — Encounter: Payer: Self-pay | Admitting: Family Medicine

## 2024-05-28 ENCOUNTER — Ambulatory Visit

## 2024-05-28 VITALS — BP 145/82 | HR 77 | Temp 97.2°F | Ht 62.0 in | Wt 144.5 lb

## 2024-05-28 DIAGNOSIS — R062 Wheezing: Secondary | ICD-10-CM | POA: Diagnosis not present

## 2024-05-28 DIAGNOSIS — J069 Acute upper respiratory infection, unspecified: Secondary | ICD-10-CM | POA: Diagnosis not present

## 2024-05-28 DIAGNOSIS — Z96612 Presence of left artificial shoulder joint: Secondary | ICD-10-CM | POA: Diagnosis not present

## 2024-05-28 DIAGNOSIS — K8020 Calculus of gallbladder without cholecystitis without obstruction: Secondary | ICD-10-CM | POA: Diagnosis not present

## 2024-05-28 LAB — VERITOR FLU A/B WAIVED
Influenza A: NEGATIVE
Influenza B: NEGATIVE

## 2024-05-28 MED ORDER — PREDNISONE 20 MG PO TABS
40.0000 mg | ORAL_TABLET | Freq: Every day | ORAL | 0 refills | Status: AC
Start: 2024-05-28 — End: 2024-05-31

## 2024-05-28 MED ORDER — GUAIFENESIN-CODEINE 100-10 MG/5ML PO SOLN
5.0000 mL | Freq: Four times a day (QID) | ORAL | 0 refills | Status: DC | PRN
Start: 2024-05-28 — End: 2024-08-10

## 2024-05-28 MED ORDER — ALBUTEROL SULFATE HFA 108 (90 BASE) MCG/ACT IN AERS
2.0000 | INHALATION_SPRAY | Freq: Four times a day (QID) | RESPIRATORY_TRACT | 0 refills | Status: AC | PRN
Start: 2024-05-28 — End: ?

## 2024-05-28 NOTE — Progress Notes (Signed)
 Subjective: CC:URI PCP: Jolinda [name] [name], DO YEP:[name] Page is a 73 y.o. female presenting to clinic today for:    History of Present Illness   Presents with a 1 week history of URI.  It started last Monday evening where she had a scratchy throat then that developed into a nonproductive cough on Tuesday.  She has also had some wheezing at bedtime.  Denies any dyspnea on exertion, no hemoptysis, measured fevers or myalgia.  No nausea, vomiting or diarrhea.  She had some chills yesterday but thinks maybe it was because the temperature got colder outside.  She has some grandchildren that have had a little bit of a cold but no known COVID or flu exposures.  She has not self tested for COVID or flu.  She had a virtual visit done several days ago and was given Tessalon  Perles but those have not been helpful.  She is been utilizing albuterol  intermittently and that does seem to help with the wheezing at bedtime.  Last use yesterday evening.  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   Urgency of urination    Wears glasses     Current Outpatient Medications:    acetaminophen  (TYLENOL ) 500 MG tablet, Take 500 mg by mouth every 6 (six) hours as needed., Disp: , Rfl:    albuterol  (VENTOLIN  HFA) 108 (90 Base) MCG/ACT inhaler, Inhale 2 puffs into the lungs every 6 (six) hours as needed for wheezing or shortness of breath., Disp: 8 g, Rfl: 0   alendronate  (FOSAMAX ) 70 MG tablet, Take 1 tablet (70 mg total) by  mouth every 7 (seven) days., Disp: 12 tablet, Rfl: 4   azelastine  (ASTELIN ) 0.1 % nasal spray, Place 1 spray into both nostrils 2 (two) times daily., Disp: 30 mL, Rfl: 12   Calcium  Carbonate-Vit D-Min (CALCIUM  1200 PO), Take 1 tablet by mouth daily., Disp: , Rfl:    cyclobenzaprine  (FLEXERIL ) 5 MG tablet, Take 1 tablet (5 mg total) by mouth 3 (three) times daily as needed for muscle spasms., Disp: 30 tablet, Rfl: 0   fluorouracil (EFUDEX) 5 % cream, Apply topically 2 (two) times a week., Disp: , Rfl:    Glycerin -Hypromellose-PEG 400 0.2-0.2-1 % SOLN, Apply 1 drop to eye 2 (two) times daily as needed (dry eyes). , Disp: , Rfl:    ibuprofen  (ADVIL ) 600 MG tablet, Take 1 tablet (600 mg total) by mouth every 8 (eight) hours as needed for moderate pain (pain score 4-6)., Disp: 90 tablet, Rfl: 0   Multiple Vitamin (MULTIVITAMIN WITH MINERALS) TABS tablet, Take 1 tablet by mouth daily., Disp: , Rfl:    rosuvastatin  (CRESTOR ) 10 MG tablet, Take 1 tablet (10 mg total) by mouth daily., Disp: 90 tablet, Rfl: 3   benzonatate  (TESSALON ) 100 MG capsule, Take 1 capsule (100 mg total) by mouth 3 (three) times daily as needed for  cough. (Patient not taking: Reported on 05/28/2024), Disp: 20 capsule, Rfl: 0   indapamide (LOZOL) 1.25 MG tablet, Take 1.25 mg by mouth daily. (Patient not taking: Reported on 05/28/2024), Disp: , Rfl:  Social History   Socioeconomic History   Marital status: Divorced    Spouse name: Not on file   Number of children: 1   Years of education: 12   Highest education level: 12th grade  Occupational History   Occupation: retired  Tobacco Use   Smoking status: Never   Smokeless tobacco: Never  Vaping Use   Vaping status: Never Used  Substance and Sexual Activity   Alcohol  use: No   Drug use: No   Sexual activity: Not Currently    Birth control/protection: Abstinence  Other Topics Concern   Not on file  Social History Narrative   Patient resides locally.  She is currently separated  from her spouse.  She is living in a mobile home near her daughter and 2 grandchildren and her father.   Social Drivers of Corporate investment banker Strain: Low Risk  (01/13/2024)   Overall Financial Resource Strain (CARDIA)    Difficulty of Paying Living Expenses: Not hard at all  Food Insecurity: No Food Insecurity (01/13/2024)   Hunger Vital Sign    Worried About Running Out of Food in the Last Year: Never true    Ran Out of Food in the Last Year: Never true  Transportation Needs: No Transportation Needs (01/13/2024)   PRAPARE - Administrator, Civil Service (Medical): No    Lack of Transportation (Non-Medical): No  Physical Activity: Insufficiently Active (01/13/2024)   Exercise Vital Sign    Days of Exercise per Week: 3 days    Minutes of Exercise per Session: 40 min  Stress: No Stress Concern Present (01/13/2024)   [employer] of Occupational Health - Occupational Stress Questionnaire    Feeling of Stress : Not at all  Social Connections: Moderately Integrated (01/13/2024)   Social Connection and Isolation Panel    Frequency of Communication with Friends and Family: More than three times a week    Frequency of Social Gatherings with Friends and Family: More than three times a week    Attends Religious Services: More than 4 times per year    Active Member of [employer] or Organizations: Yes    Attends Engineer, structural: More than 4 times per year    Marital Status: Divorced  Intimate Partner Violence: Not At Risk (01/13/2024)   Humiliation, Afraid, Rape, and Kick questionnaire    Fear of Current or Ex-Partner: No    Emotionally Abused: No    Physically Abused: No    Sexually Abused: No   Family History  Problem Relation Age of Onset   Cancer Mother        unknown source, stomach and liver    Atrial fibrillation Father    Hyperlipidemia Father    Hypertension Father    Colon polyps Father    Arthritis Father    COPD Father    Vision loss Father     Hypertension Sister    Hyperlipidemia Sister    Thyroid  disease Sister    Esophageal cancer Neg Hx    Pancreatic cancer Neg Hx     Objective: Office vital signs reviewed. BP (!) 183/89   Pulse 77   Temp (!) 97.2 F (36.2 C)   Ht 5' 2 (1.575 m)   Wt 144 lb 8 oz (65.5 kg)  SpO2 90%   BMI 26.43 kg/m   Physical Examination:  General: Awake, alert, well nourished, nontoxic. No acute distress HEENT: Normal    Neck: No masses palpated. No lymphadenopathy    Ears: Tympanic membranes intact, normal light reflex, no erythema, no bulging    Eyes: PERRLA, extraocular membranes intact, sclera white    Nose: nasal turbinates moist, clear nasal discharge    Throat: moist mucus membranes, no erythema, no tonsillar exudate.  Airway is patent Cardio: regular rate and rhythm, S1S2 heard, no murmurs appreciated Pulm: clear to auscultation bilaterally, no wheezes, rhonchi or rales; normal work of breathing on room air   DG Chest 2 View Result Date: 05/28/2024 EXAM: 2 VIEW(S) XRAY OF THE CHEST 05/28/2024 09:17:56 AM COMPARISON: 09/04/2020 CLINICAL HISTORY: Cough, mild hypoxia. FINDINGS: LUNGS AND PLEURA: No focal pulmonary opacity. No pulmonary edema. No pleural effusion. No pneumothorax. HEART AND MEDIASTINUM: No acute abnormality of the cardiac and mediastinal silhouettes. BONES AND SOFT TISSUES: Left shoulder arthroplasty. Degenerative changes noted within the thoracic spine. Small gallstones identified. IMPRESSION: 1. No acute process. Electronically signed by: Waddell Calk MD 05/28/2024 09:23 AM EDT RP Workstation: [id number]    Assessment/ Plan: 73 y.o. female    Assessment & Plan   Wheezing - Plan: Novel Coronavirus, NAA ([facility]), Veritor Flu A/B Waived, predniSONE  (DELTASONE ) 20 MG tablet, albuterol  (VENTOLIN  HFA) 108 (90 Base) MCG/ACT inhaler, DG Chest 2 View  Viral URI with cough - Plan: Novel Coronavirus, NAA ([facility]), Veritor Flu A/B Waived, predniSONE  (DELTASONE ) 20 MG tablet,  guaiFENesin -codeine  100-10 MG/5ML syrup, DG Chest 2 View  Influenza negative.  She is outside of the treatment range for COVID but we did go ahead and test her today.  Chest x-ray obtained given coughing and mild hypoxia to 90% today which is not her baseline.  Personal view of chest x-ray did not demonstrate any acute pulmonary infiltrates, radiology confirmed.  Cough syrup sent.  Caution sedation.  Prednisone  burst.  Albuterol  renewed.  Home care instructions were reviewed and reasons for reevaluation discussed.  She voiced good understanding will follow-up as needed  The Narcotic Database has been reviewed.  There were no red flags.     [name] CHRISTELLA Fielding, DO [facility] [phone]

## 2024-05-28 NOTE — Patient Instructions (Signed)

## 2024-05-29 LAB — NOVEL CORONAVIRUS, NAA: SARS-CoV-2, NAA: NOT DETECTED

## 2024-06-04 DIAGNOSIS — M9901 Segmental and somatic dysfunction of cervical region: Secondary | ICD-10-CM | POA: Diagnosis not present

## 2024-06-04 DIAGNOSIS — M9903 Segmental and somatic dysfunction of lumbar region: Secondary | ICD-10-CM | POA: Diagnosis not present

## 2024-06-04 DIAGNOSIS — M9902 Segmental and somatic dysfunction of thoracic region: Secondary | ICD-10-CM | POA: Diagnosis not present

## 2024-06-04 DIAGNOSIS — M62830 Muscle spasm of back: Secondary | ICD-10-CM | POA: Diagnosis not present

## 2024-06-06 DIAGNOSIS — Z1231 Encounter for screening mammogram for malignant neoplasm of breast: Secondary | ICD-10-CM | POA: Diagnosis not present

## 2024-06-06 LAB — HM MAMMOGRAPHY

## 2024-06-12 ENCOUNTER — Ambulatory Visit: Payer: Self-pay | Admitting: Family Medicine

## 2024-06-12 ENCOUNTER — Encounter: Payer: Self-pay | Admitting: Family Medicine

## 2024-06-12 ENCOUNTER — Telehealth: Payer: Self-pay

## 2024-06-12 DIAGNOSIS — I7090 Unspecified atherosclerosis: Secondary | ICD-10-CM | POA: Insufficient documentation

## 2024-06-12 NOTE — Telephone Encounter (Signed)
 Copied from CRM #[id number]. Topic: Clinical - Lab/Test Results >> Jun 12, 2024  3:09 PM Avram MATSU wrote: Reason for CRM: pt called regarding her imaging results, please advise [phone] (H) call tomorrow

## 2024-06-13 ENCOUNTER — Other Ambulatory Visit: Payer: Self-pay | Admitting: Family Medicine

## 2024-06-13 DIAGNOSIS — I7090 Unspecified atherosclerosis: Secondary | ICD-10-CM

## 2024-06-13 NOTE — Telephone Encounter (Signed)
 Refer to imaging result

## 2024-06-18 DIAGNOSIS — M62830 Muscle spasm of back: Secondary | ICD-10-CM | POA: Diagnosis not present

## 2024-06-18 DIAGNOSIS — M9903 Segmental and somatic dysfunction of lumbar region: Secondary | ICD-10-CM | POA: Diagnosis not present

## 2024-06-18 DIAGNOSIS — M9902 Segmental and somatic dysfunction of thoracic region: Secondary | ICD-10-CM | POA: Diagnosis not present

## 2024-06-18 DIAGNOSIS — M9901 Segmental and somatic dysfunction of cervical region: Secondary | ICD-10-CM | POA: Diagnosis not present

## 2024-06-22 DIAGNOSIS — R928 Other abnormal and inconclusive findings on diagnostic imaging of breast: Secondary | ICD-10-CM | POA: Diagnosis not present

## 2024-06-22 DIAGNOSIS — R922 Inconclusive mammogram: Secondary | ICD-10-CM | POA: Diagnosis not present

## 2024-06-22 LAB — MM IMAGING RESULTS - SCANNED

## 2024-06-25 DIAGNOSIS — M62830 Muscle spasm of back: Secondary | ICD-10-CM | POA: Diagnosis not present

## 2024-06-25 DIAGNOSIS — M9901 Segmental and somatic dysfunction of cervical region: Secondary | ICD-10-CM | POA: Diagnosis not present

## 2024-06-25 DIAGNOSIS — M9903 Segmental and somatic dysfunction of lumbar region: Secondary | ICD-10-CM | POA: Diagnosis not present

## 2024-06-25 DIAGNOSIS — M9902 Segmental and somatic dysfunction of thoracic region: Secondary | ICD-10-CM | POA: Diagnosis not present

## 2024-06-25 NOTE — Progress Notes (Signed)
 Orders have been faxed

## 2024-06-28 ENCOUNTER — Ambulatory Visit (HOSPITAL_COMMUNITY)
Admission: RE | Admit: 2024-06-28 | Discharge: 2024-06-28 | Disposition: A | Discharge status: Home / Self Care | Payer: Self-pay | Source: Ambulatory Visit | Attending: Family Medicine | Admitting: Family Medicine

## 2024-06-28 DIAGNOSIS — I7090 Unspecified atherosclerosis: Secondary | ICD-10-CM | POA: Insufficient documentation

## 2024-07-01 DIAGNOSIS — Z23 Encounter for immunization: Secondary | ICD-10-CM | POA: Diagnosis not present

## 2024-07-02 ENCOUNTER — Ambulatory Visit: Payer: Self-pay | Admitting: Family Medicine

## 2024-07-02 ENCOUNTER — Other Ambulatory Visit: Payer: Self-pay | Admitting: Radiology

## 2024-07-02 DIAGNOSIS — M9901 Segmental and somatic dysfunction of cervical region: Secondary | ICD-10-CM | POA: Diagnosis not present

## 2024-07-02 DIAGNOSIS — M62830 Muscle spasm of back: Secondary | ICD-10-CM | POA: Diagnosis not present

## 2024-07-02 DIAGNOSIS — M9903 Segmental and somatic dysfunction of lumbar region: Secondary | ICD-10-CM | POA: Diagnosis not present

## 2024-07-02 DIAGNOSIS — N6321 Unspecified lump in the left breast, upper outer quadrant: Secondary | ICD-10-CM | POA: Diagnosis not present

## 2024-07-02 DIAGNOSIS — M9902 Segmental and somatic dysfunction of thoracic region: Secondary | ICD-10-CM | POA: Diagnosis not present

## 2024-07-02 LAB — HM MAMMOGRAPHY

## 2024-07-04 LAB — SURGICAL PATHOLOGY

## 2024-07-05 ENCOUNTER — Encounter: Payer: Self-pay | Admitting: Family Medicine

## 2024-07-13 ENCOUNTER — Telehealth: Payer: Self-pay | Admitting: *Deleted

## 2024-07-13 DIAGNOSIS — C50612 Malignant neoplasm of axillary tail of left female breast: Secondary | ICD-10-CM | POA: Diagnosis not present

## 2024-07-13 DIAGNOSIS — Z86011 Personal history of benign neoplasm of the brain: Secondary | ICD-10-CM | POA: Diagnosis not present

## 2024-07-13 DIAGNOSIS — Z171 Estrogen receptor negative status [ER-]: Secondary | ICD-10-CM | POA: Diagnosis not present

## 2024-07-13 DIAGNOSIS — Z85828 Personal history of other malignant neoplasm of skin: Secondary | ICD-10-CM | POA: Diagnosis not present

## 2024-07-13 DIAGNOSIS — Z85820 Personal history of malignant melanoma of skin: Secondary | ICD-10-CM | POA: Diagnosis not present

## 2024-07-13 DIAGNOSIS — R2232 Localized swelling, mass and lump, left upper limb: Secondary | ICD-10-CM | POA: Diagnosis not present

## 2024-07-13 NOTE — Telephone Encounter (Signed)
 Per scheduler patient is quite nervous about her initial med onc appt on Monday with Dr. Loretha. I was asked to call and speak with patient. Message left on home machine with this navigator's contact information.

## 2024-07-16 ENCOUNTER — Inpatient Hospital Stay: Attending: Hematology and Oncology | Admitting: Hematology and Oncology

## 2024-07-16 ENCOUNTER — Inpatient Hospital Stay

## 2024-07-16 VITALS — BP 181/85 | HR 66 | Temp 98.2°F | Resp 17 | Wt 144.4 lb

## 2024-07-16 DIAGNOSIS — M810 Age-related osteoporosis without current pathological fracture: Secondary | ICD-10-CM | POA: Insufficient documentation

## 2024-07-16 DIAGNOSIS — C50612 Malignant neoplasm of axillary tail of left female breast: Secondary | ICD-10-CM | POA: Diagnosis not present

## 2024-07-16 DIAGNOSIS — C44521 Squamous cell carcinoma of skin of breast: Secondary | ICD-10-CM

## 2024-07-16 DIAGNOSIS — Z86011 Personal history of benign neoplasm of the brain: Secondary | ICD-10-CM | POA: Diagnosis not present

## 2024-07-16 DIAGNOSIS — Z85828 Personal history of other malignant neoplasm of skin: Secondary | ICD-10-CM | POA: Diagnosis not present

## 2024-07-16 NOTE — Progress Notes (Signed)
 [facility] CONSULT NOTE  Patient Care Team: Jolinda [name] [name], DO as PCP - General (Family Medicine) Alvaro Ricardo KATHEE Raddle., MD as Consulting Physician (Urology) Lloyd Savannah, MD as Referring Physician (Dermatology) Jalene Francis DEL as Physician Assistant (Chiropractic Medicine) Ladora Ross Lacy Phebe, MD as Referring Physician (Optometry)  CHIEF COMPLAINTS/PURPOSE OF CONSULTATION:  Newly diagnosed breast cancer  HISTORY OF PRESENTING ILLNESS:  [name] Page 73 y.o. female is here because of recent diagnosis of left axillary mass  I reviewed her records extensively and collaborated the history with the patient.  SUMMARY OF ONCOLOGIC HISTORY: Oncology History   No history exists.   Discussed the use of AI scribe software for clinical note transcription with the patient, who gave verbal consent to proceed.  History of Present Illness [name] Page is a 73 year old female with a history of multiple skin cancers who presents with a mass in the lower axilla.  A mass in the lower axilla was discovered during a routine mammogram. It is not palpable in the breast but is located in the armpit area. She did not notice the mass prior to the mammogram.  She has a history of multiple squamous cell skin cancers, treated by excision, with the most recent treatment approximately six months ago. She undergoes regular skin checks every three months due to recurrence.  Her past medical history includes a benign meningioma, surgically removed four years ago, with no radiation therapy required. She continues to have annual MRIs to monitor for recurrence.  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 She exercises regularly, attending aerobics classes twice a week. She has a history of significant sun exposure during her youth, leading to frequent sunburns due to her fair skin.  No unexplained weight loss, blood in stool, or  significant changes in health aside from the current issue. She has a history of high blood pressure, particularly in medical settings, but monitors it at home. No history of diabetes, heart disease, or other cancers. She does not smoke or drink alcohol .   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   sees Dr Ivin   Cancer [facility])    melanoma   Dysuria    Frequency of urination    Hematuria    History of kidney stones    Meningioma (HCC) 09/04/2020   Osteoporosis    Renal calculus, bilateral    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   Urgency of urination    Wears glasses     SURGICAL HISTORY: Past Surgical History:  Procedure Laterality Date   APPLICATION OF CRANIAL NAVIGATION Right 09/11/2020   Procedure: APPLICATION OF CRANIAL NAVIGATION;  Surgeon: Debby Dorn MATSU, MD;  Location: [facility] OR;  Service: Neurosurgery;  Laterality: Right;   BRAIN SURGERY     CRANIOTOMY Right 09/11/2020   Procedure: Right Pterional Craniotomy for resection of anterior skull base mass with Brain Lab;  Surgeon: Debby Dorn MATSU, MD;  Location: [facility] OR;  Service: Neurosurgery;  Laterality: Right;   CYSTOSCOPY WITH RETROGRADE PYELOGRAM, URETEROSCOPY AND STENT PLACEMENT Bilateral 08/11/2013   Procedure: CYSTOSCOPY WITH RETROGRADE PYELOGRAM, URETEROSCOPY AND STENT PLACEMENT;  Surgeon: Ricardo Alvaro, MD;  Location: [facility];  Service: Urology;  Laterality: Bilateral;   CYSTOSCOPY WITH URETEROSCOPY AND STENT PLACEMENT Bilateral  08/29/2013   Procedure: CYSTOSCOPY WITH BILATERAL URETEROSCOPY AND STENT REPLACEMENTS, stone extraction;  Surgeon: Ricardo Likens, MD;  Location: [facility];  Service: Urology;  Laterality: Bilateral;   CYSTOSCOPY/RETROGRADE/URETEROSCOPY/STONE EXTRACTION WITH BASKET Bilateral 01/31/2016   Procedure: CYSTOSCOPY/BILATERAL RETROGRADE PYELOGRAMS/BILATERAL  URETEROSCOPY/BILATERAL STONE EXTRACTION WITH BASKET/RIGHT URETERAL STENT PLACEMENT;  Surgeon: Gretel Ferrara, MD;  Location: [facility];  Service: Urology;  Laterality: Bilateral;   HEMIARTHROPLASTY SHOULDER FRACTURE Left 09/20/1990   HOLMIUM LASER APPLICATION Bilateral 08/29/2013   Procedure: HOLMIUM LASER APPLICATION;  Surgeon: Ricardo Likens, MD;  Location: [facility];  Service: Urology;  Laterality: Bilateral;   JOINT REPLACEMENT  1994    SOCIAL HISTORY: Social History   Socioeconomic History   Marital status: Divorced    Spouse name: Not on file   Number of children: 1   Years of education: 12   Highest education level: 12th grade  Occupational History   Occupation: retired  Tobacco Use   Smoking status: Never   Smokeless tobacco: Never  Vaping Use   Vaping status: Never Used  Substance and Sexual Activity   Alcohol  use: No   Drug use: No   Sexual activity: Not Currently    Birth control/protection: Abstinence  Other Topics Concern   Not on file  Social History Narrative   Patient resides locally.  She is currently separated from her spouse.  She is living in a mobile home near her daughter and 2 grandchildren and her father.   Social Drivers of Corporate Investment Banker Strain: Low Risk  (01/13/2024)   Overall Financial Resource Strain (CARDIA)    Difficulty of Paying Living Expenses: Not hard at all  Food Insecurity: No Food Insecurity (07/12/2024)   Hunger Vital Sign    Worried About Running Out of Food in the Last Year: Never true    Ran Out of Food in the Last Year: Never true  Transportation Needs: No Transportation Needs (07/12/2024)   PRAPARE - Administrator, Civil Service (Medical): No    Lack of Transportation (Non-Medical): No  Physical Activity: Insufficiently Active (01/13/2024)   Exercise Vital Sign    Days of Exercise per Week: 3 days    Minutes of Exercise per Session: 40 min  Stress: No Stress Concern Present (01/13/2024)    [employer] of Occupational Health - Occupational Stress Questionnaire    Feeling of Stress : Not at all  Social Connections: Moderately Integrated (01/13/2024)   Social Connection and Isolation Panel    Frequency of Communication with Friends and Family: More than three times a week    Frequency of Social Gatherings with Friends and Family: More than three times a week    Attends Religious Services: More than 4 times per year    Active Member of [employer] or Organizations: Yes    Attends Engineer, Structural: More than 4 times per year    Marital Status: Divorced  Intimate Partner Violence: Not At Risk (01/13/2024)   Humiliation, Afraid, Rape, and Kick questionnaire    Fear of Current or Ex-Partner: No    Emotionally Abused: No    Physically Abused: No    Sexually Abused: No    FAMILY HISTORY: Family History  Problem Relation Age of Onset   Cancer Mother        unknown source, stomach and liver    Atrial fibrillation Father    Hyperlipidemia Father    Hypertension Father    Colon polyps Father  Arthritis Father    COPD Father    Vision loss Father    Hypertension Sister    Hyperlipidemia Sister    Thyroid  disease Sister    Esophageal cancer Neg Hx    Pancreatic cancer Neg Hx     ALLERGIES:  is allergic to sulfa antibiotics.  MEDICATIONS:  Current Outpatient Medications  Medication Sig Dispense Refill   acetaminophen  (TYLENOL ) 500 MG tablet Take 500 mg by mouth every 6 (six) hours as needed.     albuterol  (VENTOLIN  HFA) 108 (90 Base) MCG/ACT inhaler Inhale 2 puffs into the lungs every 6 (six) hours as needed for wheezing or shortness of breath. 8 g 0   alendronate  (FOSAMAX ) 70 MG tablet Take 1 tablet (70 mg total) by mouth every 7 (seven) days. 12 tablet 4   azelastine  (ASTELIN ) 0.1 % nasal spray Place 1 spray into both nostrils 2 (two) times daily. 30 mL 12   benzonatate  (TESSALON ) 100 MG capsule Take 1 capsule (100 mg total) by mouth 3 (three) times daily  as needed for cough. (Patient not taking: Reported on 05/28/2024) 20 capsule 0   Calcium  Carbonate-Vit D-Min (CALCIUM  1200 PO) Take 1 tablet by mouth daily.     cyclobenzaprine  (FLEXERIL ) 5 MG tablet Take 1 tablet (5 mg total) by mouth 3 (three) times daily as needed for muscle spasms. 30 tablet 0   fluorouracil (EFUDEX) 5 % cream Apply topically 2 (two) times a week.     Glycerin -Hypromellose-PEG 400 0.2-0.2-1 % SOLN Apply 1 drop to eye 2 (two) times daily as needed (dry eyes).      guaiFENesin -codeine  100-10 MG/5ML syrup Take 5 mLs by mouth every 6 (six) hours as needed for cough. 120 mL 0   ibuprofen  (ADVIL ) 600 MG tablet Take 1 tablet (600 mg total) by mouth every 8 (eight) hours as needed for moderate pain (pain score 4-6). 90 tablet 0   indapamide (LOZOL) 1.25 MG tablet Take 1.25 mg by mouth daily. (Patient not taking: Reported on 05/28/2024)     Multiple Vitamin (MULTIVITAMIN WITH MINERALS) TABS tablet Take 1 tablet by mouth daily.     rosuvastatin  (CRESTOR ) 10 MG tablet Take 1 tablet (10 mg total) by mouth daily. 90 tablet 3   No current facility-administered medications for this visit.    REVIEW OF SYSTEMS:   Constitutional: Denies fevers, chills or abnormal night sweats Eyes: Denies blurriness of vision, double vision or watery eyes Ears, nose, mouth, throat, and face: Denies mucositis or sore throat Respiratory: Denies cough, dyspnea or wheezes Cardiovascular: Denies palpitation, chest discomfort or lower extremity swelling Gastrointestinal:  Denies nausea, heartburn or change in bowel habits Skin: Denies abnormal skin rashes Lymphatics: Denies new lymphadenopathy or easy bruising Neurological:Denies numbness, tingling or new weaknesses Behavioral/Psych: Mood is stable, no new changes  Breast:  Denies any palpable lumps or discharge All other systems were reviewed with the patient and are negative.  PHYSICAL EXAMINATION: ECOG PERFORMANCE STATUS: 0 - Asymptomatic  Vitals:    07/16/24 1307  BP: (!) 181/85  Pulse: 66  Resp: 17  Temp: 98.2 F (36.8 C)  SpO2: 97%   Filed Weights   07/16/24 1307  Weight: 144 lb 6.4 oz (65.5 kg)    GENERAL:alert, no distress and comfortable Large mass in the lower axilla, about 5 cms. No other palpable breast mass. No regional adenopathy  LABORATORY DATA:  I have reviewed the data as listed Lab Results  Component Value Date   WBC 6.3 12/12/2023   HGB  12.6 12/12/2023   HCT 40.2 12/12/2023   MCV 83 12/12/2023   PLT 204 12/12/2023   Lab Results  Component Value Date   NA 143 02/14/2024   K 4.4 02/14/2024   CL 106 02/14/2024   CO2 22 02/14/2024    RADIOGRAPHIC STUDIES: I have personally reviewed the radiological reports and agreed with the findings in the report.  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 Detected on mammogram, not breast cancer. Differential diagnosis includes squamous cell carcinoma from other organs, less likely. Further treatment post-surgery may be required based on pathology results. - Order PET scan from base of skull to mid-thigh to assess for other areas of involvement. - Proceed with surgical removal of the mass by Dr. Aron. - Review pathology post-surgery to determine if immunotherapy with Cemiplimab is needed. - Consider radiation therapy post-surgery if indicated.  Multiple cutaneous squamous cell carcinomas She has multiple cutaneous squamous cell carcinomas, previously treated with excision. The current axillary mass is presumed related to this history. No new skin lesions noted at last dermatological visit. - Request records from Dr. Barkley regarding previous skin cancer treatments. - Continue regular dermatological follow-up every three  months.   Time spent: 45 min All questions were answered. The patient knows to call the clinic with any problems, questions or concerns.    Amber Stalls, MD 07/16/24

## 2024-07-23 ENCOUNTER — Inpatient Hospital Stay: Attending: Hematology and Oncology | Admitting: Licensed Clinical Social Worker

## 2024-07-23 NOTE — Progress Notes (Signed)
 [facility] Clinical Social Work  Initial Assessment   [name] Page is a 73 y.o. year old female contacted by phone. Clinical Social Work was referred by new patient protocol for assessment of psychosocial needs.   SDOH (Social Determinants of Health) assessments performed: Yes SDOH Interventions    Flowsheet Row Clinical Support from 07/23/2024 in [facility] [place] Clinical Support from 01/13/2024 in [facility] Clinical Support from 01/10/2023 in [facility] Clinical Support from 01/06/2022 in [facility] Office Visit from 08/12/2020 in [facility] Office Visit from 07/18/2020 in [facility]  SDOH Interventions        Food Insecurity Interventions Intervention Not Indicated Intervention Not Indicated Intervention Not Indicated Intervention Not Indicated -- --  Housing Interventions Intervention Not Indicated Intervention Not Indicated Intervention Not Indicated Intervention Not Indicated -- --  Transportation Interventions Intervention Not Indicated Intervention Not Indicated Intervention Not Indicated Intervention Not Indicated -- --  Utilities Interventions Intervention Not Indicated Intervention Not Indicated Intervention Not Indicated -- -- --  Alcohol  Usage Interventions -- Intervention Not Indicated (Score <7) Intervention Not Indicated (Score <7) -- -- --  Depression Interventions/Treatment  -- PHQ2-9 Score <4 Follow-up Not Indicated -- -- Medication Medication  Financial Strain Interventions -- Intervention Not Indicated Intervention Not Indicated Intervention Not Indicated -- --  Physical Activity Interventions -- Intervention Not Indicated Intervention Not Indicated Intervention Not Indicated -- --  Stress Interventions -- Intervention Not Indicated Intervention Not  Indicated Intervention Not Indicated -- --  Social Connections Interventions -- Intervention Not Indicated Intervention Not Indicated Intervention Not Indicated -- --  Health Literacy Interventions -- Intervention Not Indicated -- -- -- --    SDOH Screenings   Food Insecurity: No Food Insecurity (07/23/2024)  Housing: Low Risk  (07/23/2024)  Transportation Needs: No Transportation Needs (07/23/2024)  Utilities: Not At Risk (07/23/2024)  Alcohol  Screen: Low Risk  (01/13/2024)  Depression (PHQ2-9): Low Risk  (02/14/2024)  Financial Resource Strain: Low Risk  (01/13/2024)  Physical Activity: Insufficiently Active (01/13/2024)  Social Connections: Moderately Integrated (01/13/2024)  Stress: No Stress Concern Present (01/13/2024)  Tobacco Use: Low Risk  (07/13/2024)   Received from [facility]  Health Literacy: Adequate Health Literacy (01/13/2024)    PHQ 2/9:    02/14/2024    9:57 AM 01/13/2024    8:54 AM 01/10/2023    1:54 PM  Depression screen PHQ 2/9  Decreased Interest 0 0 0  Down, Depressed, Hopeless 0 0 0  PHQ - 2 Score 0 0 0  Altered sleeping 0 0   Tired, decreased energy 0 0   Change in appetite 0 0   Feeling bad or failure about yourself  0 0   Trouble concentrating 0 0   Moving slowly or fidgety/restless 0 0   Suicidal thoughts 0 0   PHQ-9 Score 0 0   Difficult doing work/chores Not difficult at all Not difficult at all      Distress Screen completed: No     No data to display            Family/Social Information:  Housing Arrangement: patient lives alone. Her family and she live off of the same private road (dad, sister, daughter & grandkids) Family members/support persons in your life? Family, Friends, and [employer] Financial concerns: no  Employment: Retired.  Income source:  Social Clinical Biochemist concerns: No Type of concern: None Food access concerns: no Religious or spiritual practice: Yes-involved in church. Her faith &  people praying for her is helpful during this time Advanced directives: Scientist, Research (physical Sciences) Currently in place:  Medicare & BCBS supplement  Coping/ Adjustment to diagnosis: Patient understands treatment plan and what happens next? yes, pt feels better after meeting with medical providers and learning more about next steps Patient reported stressors: Adjusting to my illness Patient enjoys exercise (aerobics 2x/week), time with family including 19yo grandson and 13yo granddaughter, church Current coping skills/ strengths: Ability for insight , Capable of independent living , Manufacturing systems engineer , Religious Affiliation , Special hobby/interest , and Supportive family/friends     SUMMARY: Current SDOH Barriers:  No major barriers identified today  Clinical Social Work Clinical Goal(s):  No clinical social work goals at this time  Interventions: Discussed common feeling and emotions when being diagnosed with cancer, and the importance of support during treatment Informed patient of the support team roles and support services at [facility] Provided CSW contact information and encouraged patient to call with any questions or concerns   Follow Up Plan: Patient will contact CSW with any support or resource needs Patient verbalizes understanding of plan: Yes    Gotham Raden E Carlisia Geno, LCSW Clinical Social Worker [facility]

## 2024-07-30 ENCOUNTER — Encounter (HOSPITAL_COMMUNITY)
Admission: RE | Admit: 2024-07-30 | Discharge: 2024-07-30 | Disposition: A | Discharge status: Home / Self Care | Source: Ambulatory Visit | Attending: Hematology and Oncology | Admitting: Hematology and Oncology

## 2024-07-30 DIAGNOSIS — C44521 Squamous cell carcinoma of skin of breast: Secondary | ICD-10-CM | POA: Insufficient documentation

## 2024-07-30 DIAGNOSIS — C44629 Squamous cell carcinoma of skin of left upper limb, including shoulder: Secondary | ICD-10-CM | POA: Diagnosis not present

## 2024-07-30 LAB — GLUCOSE, CAPILLARY: Glucose-Capillary: 95 mg/dL (ref 70–99)

## 2024-07-30 MED ORDER — FLUDEOXYGLUCOSE F - 18 (FDG) INJECTION
7.1300 | Freq: Once | INTRAVENOUS | Status: AC | PRN
  Administered 2024-07-30: 7.13 via INTRAVENOUS

## 2024-08-03 ENCOUNTER — Other Ambulatory Visit: Payer: Self-pay | Admitting: General Surgery

## 2024-08-03 DIAGNOSIS — C779 Secondary and unspecified malignant neoplasm of lymph node, unspecified: Secondary | ICD-10-CM | POA: Diagnosis not present

## 2024-08-03 DIAGNOSIS — R2232 Localized swelling, mass and lump, left upper limb: Secondary | ICD-10-CM | POA: Diagnosis not present

## 2024-08-03 NOTE — Progress Notes (Signed)
 PROVIDER:  JINA CLAIR NEPHEW, MD Patient Care Team: Jolinda [name] [name], DO as PCP - General Nephew Jina Clair, MD as Consulting Provider (Surgical Oncology) Vonzell Rosina Moats, MD as Referring Physician (Radiology)  MRN: [id number] DOB: 1950-10-12 DATE OF ENCOUNTER: 08/03/2024    Chief Complaint: Recheck  (2 wk f/u, proven metastatic cancer-left axillary tail., PET to be done 11/10/)     History of Present Illness: [name] Page is a 73 y.o. female who is seen today for follow-up after PET scan.  Initial history:   [name] Page is a 73 year old female who presented 06/2024 with a left axillary mass found on mammogram. She is accompanied by her daughter, [name] Page.   A left axillary mass was discovered during a routine mammogram. There was no associated breast pain.   The patient's diagnostic mammogram and ultrasound showed a 4.2-4.5 centimeter mass in the far axillary tail of the left breast.  No other findings were seen.  Core needle biopsy was performed which demonstrated metastatic carcinoma with no tissue identified.  Due to the unusual morphology, significant numbers of stains were performed.  Of note it was ER/PR and HER2 negative.  The comment noted that this did not support a breast primary but appeared to be more of a carcinoma with squamous differentiation.   She has a history of a benign meningioma diagnosed in 2021, which required extensive surgery due to its size. Additionally, she had a melanoma, which was a mole on her back removed approximately three to four years ago. The melanoma was excised completely, and no lymph node biopsy was performed at that time.   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 These were addressed after her brain surgery, as she was debilitated at the time and unable to notice their severity.   Her family history includes her mother having bile duct  cancer at the age of 72. There are no other known cancers in her immediate family.   No heart problems are reported, and she follows a diet that includes seafood and chicken, with occasional burgers.    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  She has a history of squamous cell carcinoma on the chest wall as well as, previously affecting her extremities. She is currently being evaluated for a mass, which is suspected to be related to her previous squamous cell carcinoma. A PET scan was performed and did not reveal any additional concerning findings.  No history of heart attacks or lung problems. She is not on any blood thinners.   PET 07/30/24 IMPRESSION: 1. Left lateral breast/axillary bilobed mass measuring 5.2 x 3.4 cm with maximum SUV 15.8, compatible with malignancy. 2. Mild cardiomegaly. 3. Aortic atherosclerosis. 4. Cholelithiasis. 5. Grade 1 anterolisthesis at C4-5.  Review of Systems: A complete review of systems was obtained from the patient.  I have reviewed this information and discussed as appropriate with the patient.  See HPI as well for other ROS.  ROS    Medical History: Past Medical History:  Diagnosis Date  . Arthritis   . 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  . Dysuria   . Frequency of urination   . Hematuria   . History of kidney stones   . Meningioma (CMS/HHS-HCC) 09/04/2020  . Osteoporosis   .  Renal calculus, bilateral   . 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 Urgency of urination     Patient Active Problem List  Diagnosis  . Axillary mass, left  . 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 H/O squamous cell carcinoma of skin  . H/O meningioma of the brain  . H/O melanoma excision    Past Surgical History:  Procedure Laterality Date  . HEMIARTHROPLASTY SHOULDER FRACTURE Left 09/20/1990  .  SABRAJoint replacement  1994  . Cystoscopy with retrograde pyelogram, ureteroscopy and stent placemen Bilateral 08/11/2013  . Holmium laser application  Bilateral 08/29/2013  . Cystoscopy with ureteroscopy and stent placement Bilateral 08/29/2013  . Cystoscopy/retrograde/ureteroscopy/stone extraction with basket Bilateral 01/31/2016  . Application of cranial navigation Right 09/11/2020  . SABRACraniotomy   Right 09/11/2020  . SABRABrain surgery       Allergies  Allergen Reactions  . Sulfa (Sulfonamide Antibiotics) Rash    Current Outpatient Medications on File Prior to Visit  Medication Sig Dispense Refill  . albuterol  MDI, PROVENTIL , VENTOLIN , PROAIR , HFA 90 mcg/actuation inhaler Inhale 2 inhalations into the lungs every 6 (six) hours as needed for Wheezing    . alendronate  (FOSAMAX ) 70 MG tablet Take 70 mg by mouth every 7 (seven) days    . azelastine  (ASTELIN ) 137 mcg nasal spray Place 1 spray into both nostrils 2 (two) times daily    . multivitamin with minerals tablet Take 1 tablet by mouth once daily    . rosuvastatin  (CRESTOR ) 10 MG tablet Take 10 mg by mouth once daily     No current facility-administered medications on file prior to visit.    Family History  Problem Relation Age of Onset  . Cancer Mother   . Atrial fibrillation (Abnormal heart rhythm sometimes requiring treatment with blood thinners) Father   . Colon polyps Father   . Arthritis Father   . COPD Father   . Vision loss Father   . Hyperlipidemia (Elevated cholesterol) Father   . High blood pressure (Hypertension) Father   . Hyperlipidemia (Elevated cholesterol) Sister   . High blood pressure (Hypertension) Sister   . Thyroid  disease Sister   . Esophageal cancer Neg Hx   . Pancreatic cancer Neg Hx      Social History   Tobacco Use  Smoking Status Never  Smokeless Tobacco Never     Social History   Socioeconomic History  . Marital status: Divorced  Tobacco Use  . Smoking status: Never  . Smokeless  tobacco: Never  Substance and Sexual Activity  . Drug use: Never  . Sexual activity: Defer   Social Drivers of Health   Financial Resource Strain: Low Risk  (01/13/2024)   Received from [facility]   Overall Financial Resource Strain (CARDIA)   . Difficulty of Paying Living Expenses: Not hard at all  Food Insecurity: No Food Insecurity (07/23/2024)   Received from [facility]   Hunger Vital Sign   . Within the past 12 months, you worried that your food would run out before you got the money to buy more.: Never true   . Within the past 12 months, the food you bought just didn't last and you didn't have money to get more.: Never true  Transportation Needs: No Transportation Needs (07/23/2024)   Received from [facility] - Transportation   . In the past 12 months, has lack of transportation kept you from medical appointments or from getting medications?: No   .  In the past 12 months, has lack of transportation kept you from meetings, work, or from getting things needed for daily living?: No  Physical Activity: Insufficiently Active (01/13/2024)   Received from [facility]   Exercise Vital Sign   . On average, how many days per week do you engage in moderate to strenuous exercise (like a brisk walk)?: 3 days   . On average, how many minutes do you engage in exercise at this level?: 40 min  Stress: No Stress Concern Present (01/13/2024)   Received from [facility] - Occupational Stress Questionnaire   . Feeling of Stress : Not at all  Social Connections: Moderately Integrated (01/13/2024)   Received from [facility]   Social Connection and Isolation Panel   . In a typical week, how many times do you talk on the phone with family, friends, or neighbors?: More than three times a week   . How often do you get together with friends or relatives?: More than three times a week   . How often do you attend church or religious services?: More than 4  times per year   . Do you belong to any clubs or organizations such as church groups, unions, fraternal or athletic groups, or school groups?: Yes   . How often do you attend meetings of the clubs or organizations you belong to?: More than 4 times per year   . Are you married, widowed, divorced, separated, never married, or living with a partner?: Divorced  Housing Stability: Unknown (07/13/2024)   Housing Stability Vital Sign   . Homeless in the Last Year: No    Objective:    Vitals:   08/03/24 1211  PainSc: 0-No pain    There is no height or weight on file to calculate BMI.  Head: Normocephalic and atraumatic.  Eyes:  Conjunctivae are normal. Pupils are equal, round, and reactive to light. No scleral icterus.  Neck:  Normal range of motion. Neck supple.  Resp: No respiratory distress, normal effort. Breast: 5 cm mobile axillary/breast axillary tail mass.  Nontender.  No overlying skin changes.  Remainder of breast benign. Neurological: Alert and oriented to person, place, and time. Coordination normal.  Skin: Skin is warm and dry. No rash noted. No diaphoretic. No erythema. No pallor.  Psychiatric: Normal mood and affect. Normal behavior. Judgment and thought content normal.    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 PET scan showed no other abnormalities. - Scheduled surgical excision with margin. - Would plan to remove any enlarged lymph nodes seen at the time of surgery along with the mass if enlarged nodes found. - Use dissolvable stitches and glue for closure. - Consider drain placement if defect large. - Refer to physical therapy pre- and  post-op. - Provide breast binder post-op. - Advise no heavy lifting or strenuous activity for 1-2 weeks post-op. - Schedule surgery before Christmas.  The surgical procedure was described to the patient and daughter  We discussed the risks bleeding, infection, damage to other structures, need for further procedures/surgeries.  We discussed the risk of seroma.  The patient was  advised that we may need to go back to surgery for additional tissue to obtain negative margins or for additional lymph nodes . The patient was advised that these are the most common complications, but that others can occur as well. I discussed the risk of alteration in breast contour or size.  I discussed risk of chronic pain.  There are rare instances of heart/lung issues post op as well as blood clots.     They were advised against taking aspirin or other anti-inflammatory agents/blood thinners the week before surgery.    The risks and benefits of the procedure were described to the patient and she wishes to proceed.      No follow-ups on file.   JINA CLAIR NEPHEW, MD

## 2024-08-06 DIAGNOSIS — M9901 Segmental and somatic dysfunction of cervical region: Secondary | ICD-10-CM | POA: Diagnosis not present

## 2024-08-06 DIAGNOSIS — M62830 Muscle spasm of back: Secondary | ICD-10-CM | POA: Diagnosis not present

## 2024-08-06 DIAGNOSIS — M9903 Segmental and somatic dysfunction of lumbar region: Secondary | ICD-10-CM | POA: Diagnosis not present

## 2024-08-06 DIAGNOSIS — M9902 Segmental and somatic dysfunction of thoracic region: Secondary | ICD-10-CM | POA: Diagnosis not present

## 2024-08-08 ENCOUNTER — Ambulatory Visit: Payer: Self-pay | Admitting: Hematology and Oncology

## 2024-08-08 NOTE — Telephone Encounter (Signed)
 Results given. States surgery is scheduled for 12/4 and would like to r/s her MD visit 12/3 for after surgery. Message sent to scheduling.

## 2024-08-08 NOTE — Telephone Encounter (Signed)
-----   Message from [place] sent at 08/08/2024  7:51 AM EST ----- PET CT with no other evidence of disease.  Thanks, ----- Message ----- From: Interface, Rad Results In Sent: 08/01/2024  11:08 AM EST To: Amber Stalls, MD

## 2024-08-13 DIAGNOSIS — M9903 Segmental and somatic dysfunction of lumbar region: Secondary | ICD-10-CM | POA: Diagnosis not present

## 2024-08-13 DIAGNOSIS — M9902 Segmental and somatic dysfunction of thoracic region: Secondary | ICD-10-CM | POA: Diagnosis not present

## 2024-08-13 DIAGNOSIS — M9901 Segmental and somatic dysfunction of cervical region: Secondary | ICD-10-CM | POA: Diagnosis not present

## 2024-08-13 DIAGNOSIS — M62830 Muscle spasm of back: Secondary | ICD-10-CM | POA: Diagnosis not present

## 2024-08-17 ENCOUNTER — Encounter (HOSPITAL_COMMUNITY): Payer: Self-pay

## 2024-08-17 NOTE — Pre-Procedure Instructions (Signed)
 Surgical Instructions   Your procedure is scheduled on August 23, 2024. Report to [facility] A at 12:45 P.M., then check in with the Admitting office. Any questions or running late day of surgery: call [phone]  Questions prior to your surgery date: call [phone], Monday-Friday, 8am-4pm. If you experience any cold or flu symptoms such as cough, fever, chills, shortness of breath, etc. between now and your scheduled surgery, please notify us  at the above number.     Remember:  Do not eat after midnight the night before your surgery   You may drink clear liquids until 11:45 AM the morning of your surgery.   Clear liquids allowed are: Water, Non-Citrus Juices (without pulp), Carbonated Beverages, Clear Tea (no milk, honey, etc.), Black Coffee Only (NO MILK, CREAM OR POWDERED CREAMER of any kind), and Gatorade.    Take these medicines the morning of surgery with A SIP OF WATER: rosuvastatin  (CRESTOR )    May take these medicines IF NEEDED: acetaminophen  (TYLENOL )  albuterol  (VENTOLIN  HFA) inhaler - please bring inhaler with you morning of surgery azelastine  (ASTELIN ) nasal spray  cyclobenzaprine  (FLEXERIL )  Glycerin -Hypromellose-PEG eye drops   One week prior to surgery, STOP taking any Aspirin (unless otherwise instructed by your surgeon) Aleve, Naproxen, Ibuprofen , Motrin , Advil , Goody's, BC's, all herbal medications, fish oil, and non-prescription vitamins.                     Do NOT Smoke (Tobacco/Vaping) for 24 hours prior to your procedure.  If you use a CPAP at night, you may bring your mask/headgear for your overnight stay.   You will be asked to remove any contacts, glasses, piercing's, hearing aid's, dentures/partials prior to surgery. Please bring cases for these items if needed.    Patients discharged the day of surgery will not be allowed to drive home, and someone needs to stay with them for 24 hours.  SURGICAL WAITING ROOM  VISITATION Patients may have no more than 2 support people in the waiting area - these visitors may rotate.   Pre-op nurse will coordinate an appropriate time for 1 ADULT support person, who may not rotate, to accompany patient in pre-op.  Children under the age of 69 must have an adult with them who is not the patient and must remain in the main waiting area with an adult.  If the patient needs to stay at the hospital during part of their recovery, the visitor guidelines for inpatient rooms apply.  Please refer to the [facility] website for the visitor guidelines for any additional information.   If you received a COVID test during your pre-op visit  it is requested that you wear a mask when out in public, stay away from anyone that may not be feeling well and notify your surgeon if you develop symptoms. If you have been in contact with anyone that has tested positive in the last 10 days please notify you surgeon.      Pre-operative CHG Bathing Instructions   You can play a key role in reducing the risk of infection after surgery. Your skin needs to be as free of germs as possible. You can reduce the number of germs on your skin by washing with CHG (chlorhexidine  gluconate) soap before surgery. CHG is an antiseptic soap that kills germs and continues to kill germs even after washing.   DO NOT use if you have an allergy to chlorhexidine /CHG or antibacterial soaps. If your skin becomes reddened or irritated,  stop using the CHG and notify one of our RNs at [phone].              TAKE A SHOWER THE NIGHT BEFORE SURGERY   Please keep in mind the following:  DO NOT shave, including legs and underarms, 48 hours prior to surgery.   You may shave your face before/day of surgery.  Place clean sheets on your bed the night before surgery Use a clean washcloth (not used since being washed) for shower. DO NOT sleep with pet's night before surgery.  CHG Shower Instructions:  Wash your face and  private area with normal soap. If you choose to wash your hair, wash first with your normal shampoo.  After you use shampoo/soap, rinse your hair and body thoroughly to remove shampoo/soap residue.  Turn the water OFF and apply half the bottle of CHG soap to a CLEAN washcloth.  Apply CHG soap ONLY FROM YOUR NECK DOWN TO YOUR TOES (washing for 3-5 minutes)  DO NOT use CHG soap on face, private areas, open wounds, or sores.  Pay special attention to the area where your surgery is being performed.  If you are having back surgery, having someone wash your back for you may be helpful. Wait 2 minutes after CHG soap is applied, then you may rinse off the CHG soap.  Pat dry with a clean towel  Put on clean pajamas    Additional instructions for the day of surgery: If you choose, you may shower the morning of surgery with an antibacterial soap.  DO NOT APPLY any lotions, deodorants, cologne, or perfumes.   Do not wear jewelry or makeup Do not wear nail polish, gel polish, artificial nails, or any other type of covering on natural nails (fingers and toes) Do not bring valuables to the hospital. [facility] is not responsible for valuables/personal belongings. Put on clean/comfortable clothes.  Please brush your teeth.  Ask your nurse before applying any prescription medications to the skin.

## 2024-08-20 ENCOUNTER — Other Ambulatory Visit: Payer: Self-pay

## 2024-08-20 ENCOUNTER — Encounter (HOSPITAL_COMMUNITY): Payer: Self-pay

## 2024-08-20 ENCOUNTER — Encounter (HOSPITAL_COMMUNITY)
Admission: RE | Admit: 2024-08-20 | Discharge: 2024-08-20 | Disposition: A | Source: Ambulatory Visit | Attending: General Surgery

## 2024-08-20 VITALS — BP 156/72 | HR 72 | Temp 98.3°F | Resp 16 | Ht 61.0 in | Wt 145.9 lb

## 2024-08-20 DIAGNOSIS — I129 Hypertensive chronic kidney disease with stage 1 through stage 4 chronic kidney disease, or unspecified chronic kidney disease: Secondary | ICD-10-CM | POA: Insufficient documentation

## 2024-08-20 DIAGNOSIS — Z01818 Encounter for other preprocedural examination: Secondary | ICD-10-CM | POA: Insufficient documentation

## 2024-08-20 DIAGNOSIS — Z85820 Personal history of malignant melanoma of skin: Secondary | ICD-10-CM | POA: Insufficient documentation

## 2024-08-20 DIAGNOSIS — C7989 Secondary malignant neoplasm of other specified sites: Secondary | ICD-10-CM | POA: Insufficient documentation

## 2024-08-20 DIAGNOSIS — Z86011 Personal history of benign neoplasm of the brain: Secondary | ICD-10-CM | POA: Diagnosis not present

## 2024-08-20 DIAGNOSIS — I10 Essential (primary) hypertension: Secondary | ICD-10-CM

## 2024-08-20 DIAGNOSIS — N1831 Chronic kidney disease, stage 3a: Secondary | ICD-10-CM | POA: Insufficient documentation

## 2024-08-20 DIAGNOSIS — Z85828 Personal history of other malignant neoplasm of skin: Secondary | ICD-10-CM | POA: Insufficient documentation

## 2024-08-20 HISTORY — DX: Essential (primary) hypertension: I10

## 2024-08-20 LAB — CBC
HCT: 41.9 % (ref 36.0–46.0)
Hemoglobin: 13.1 g/dL (ref 12.0–15.0)
MCH: 27.4 pg (ref 26.0–34.0)
MCHC: 31.3 g/dL (ref 30.0–36.0)
MCV: 87.7 fL (ref 80.0–100.0)
Platelets: 189 K/uL (ref 150–400)
RBC: 4.78 MIL/uL (ref 3.87–5.11)
RDW: 14.3 % (ref 11.5–15.5)
WBC: 5.4 K/uL (ref 4.0–10.5)
nRBC: 0 % (ref 0.0–0.2)

## 2024-08-20 LAB — BASIC METABOLIC PANEL WITH GFR
Anion gap: 13 (ref 5–15)
BUN: 19 mg/dL (ref 8–23)
CO2: 24 mmol/L (ref 22–32)
Calcium: 9.4 mg/dL (ref 8.9–10.3)
Chloride: 108 mmol/L (ref 98–111)
Creatinine, Ser: 0.84 mg/dL (ref 0.44–1.00)
GFR, Estimated: >60 mL/min (ref >60)
Glucose, Bld: 89 mg/dL (ref 70–99)
Potassium: 4.1 mmol/L (ref 3.5–5.1)
Sodium: 145 mmol/L (ref 135–145)

## 2024-08-20 NOTE — Progress Notes (Signed)
 PCP -    Jolinda [name] [name], DO   Cardiologist - denies  PPM/ICD - N/A   Chest x-ray - N/A EKG - 08/20/2024  Stress Test - denies ECHO - denies Cardiac Cath - denies  Sleep Study - denies  Fasting Blood Sugar - N/A  Last dose of GLP1 agonist-  N/A   Blood Thinner Instructions: N/A Aspirin Instructions: N/A  ERAS Protcol - ERAS no drink  COVID TEST- N/A   Anesthesia review: BP 172/83 at PAT- re-check 156/72. Pt states that she used to be on BP medications, but is no longer on this medication. Her primary care doctor is watching her BP as well as her kidney function. EKG done today. Pt reports that she checks her BP BID at home- 130s/70s and was instructed by PCP to notify her if she is getting readings over 140 systolic. Pt denies cardiac symptoms.   Patient denies shortness of breath, fever, cough and chest pain at PAT appointment   All instructions explained to the patient, with a verbal understanding of the material. Patient agrees to go over the instructions while at home for a better understanding. The opportunity to ask questions was provided.

## 2024-08-20 NOTE — H&P (Signed)
 PROVIDER:  JINA CLAIR NEPHEW, MD Patient Care Team: Jolinda [name] [name], DO as PCP - General Nephew Jina Clair, MD as Consulting Provider (Surgical Oncology) Vonzell Rosina Moats, MD as Referring Physician (Radiology)   MRN: [id number] DOB: 1951-03-25 DATE OF ENCOUNTER: 08/03/2024 Plan Chief Complaint: Recheck  (2 wk f/u, proven metastatic cancer-left axillary tail., PET to be done 11/10/)       History of Present Illness: [name] Page is a 73 y.o. female who is seen today for follow-up after PET scan.   Initial history:    [name] Page is a 73 year old female who presented 06/2024 with a left axillary mass found on mammogram. She is accompanied by her daughter, [name] Page.   A left axillary mass was discovered during a routine mammogram. There was no associated breast pain.   The patient's diagnostic mammogram and ultrasound showed a 4.2-4.5 centimeter mass in the far axillary tail of the left breast.  No other findings were seen.  Core needle biopsy was performed which demonstrated metastatic carcinoma with no tissue identified.  Due to the unusual morphology, significant numbers of stains were performed.  Of note it was ER/PR and HER2 negative.  The comment noted that this did not support a breast primary but appeared to be more of a carcinoma with squamous differentiation.   She has a history of a benign meningioma diagnosed in 2021, which required extensive surgery due to its size. Additionally, she had a melanoma, which was a mole on her back removed approximately three to four years ago. The melanoma was excised completely, and no lymph node biopsy was performed at that time.   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 These were addressed after her brain surgery, as she was debilitated at the time and unable to notice their severity.   Her family history includes her mother having bile duct  cancer at the age of 42. There are no other known cancers in her immediate family.   No heart problems are reported, and she follows a diet that includes seafood and chicken, with occasional burgers.     Interval history:    History of Present Illness [name] is a 73 year old female with squamous cell carcinoma who presents for surgical follow up .   She has a history of squamous cell carcinoma on the chest wall as well as, previously affecting her extremities. She is currently being evaluated for a mass, which is suspected to be related to her previous squamous cell carcinoma. A PET scan was performed and did not reveal any additional concerning findings.   No history of heart attacks or lung problems. She is not on any blood thinners.     PET 07/30/24 IMPRESSION: 1. Left lateral breast/axillary bilobed mass measuring 5.2 x 3.4 cm with maximum SUV 15.8, compatible with malignancy. 2. Mild cardiomegaly. 3. Aortic atherosclerosis. 4. Cholelithiasis. 5. Grade 1 anterolisthesis at C4-5.   Review of Systems: A complete review of systems was obtained from the patient.  I have reviewed this information and discussed as appropriate with the patient.  See HPI as well for other ROS.   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)   H/O squamous cell carcinoma of skin   H/O meningioma of the brain   H/O melanoma excision        Past Surgical History       Past Surgical History:   Procedure Laterality Date   HEMIARTHROPLASTY SHOULDER FRACTURE Left 09/20/1990   .Joint replacement   1994   Cystoscopy with retrograde pyelogram, ureteroscopy and stent placemen Bilateral 08/11/2013   Holmium laser application  Bilateral 08/29/2013   Cystoscopy with ureteroscopy and stent placement Bilateral 08/29/2013   Cystoscopy/retrograde/ureteroscopy/stone extraction with basket Bilateral 01/31/2016   Application of cranial navigation Right 09/11/2020   .Craniotomy   Right 09/11/2020   .Brain surgery            Allergies      Allergies  Allergen Reactions   Sulfa (Sulfonamide Antibiotics) Rash        Medications Ordered Prior to Encounter        Current Outpatient Medications on File Prior to Visit  Medication Sig Dispense Refill   albuterol  MDI, PROVENTIL , VENTOLIN , PROAIR , HFA 90 mcg/actuation inhaler Inhale 2 inhalations into the lungs every 6 (six) hours as needed for Wheezing       alendronate  (FOSAMAX ) 70 MG tablet Take 70 mg by mouth every 7 (seven) days       azelastine  (ASTELIN ) 137 mcg nasal spray Place 1 spray into both nostrils 2 (two) times daily       multivitamin with minerals tablet Take 1 tablet by mouth once daily       rosuvastatin  (CRESTOR ) 10 MG tablet Take 10 mg by mouth once daily        No current facility-administered medications on file prior to visit.        Family History       Family History  Problem Relation Age of Onset   Cancer Mother     Atrial fibrillation (Abnormal heart rhythm sometimes requiring treatment with blood thinners) Father     Colon polyps Father     Arthritis Father     COPD Father     Vision loss Father     Hyperlipidemia (Elevated cholesterol) Father     High blood pressure (Hypertension) Father     Hyperlipidemia (Elevated cholesterol) Sister     High blood pressure (Hypertension) Sister     Thyroid  disease Sister     Esophageal cancer Neg Hx     Pancreatic cancer Neg Hx          Tobacco Use History   Social History       Tobacco Use  Smoking Status Never  Smokeless Tobacco Never        Social History  Social History        Socioeconomic History   Marital status: Divorced  Tobacco Use   Smoking status: Never   Smokeless tobacco: Never  Substance and Sexual Activity   Drug use: Never   Sexual activity: Defer    Social Drivers of Health  Financial Resource Strain: Low Risk  (01/13/2024)    Received from [facility]    Overall Financial Resource Strain (CARDIA)     Difficulty of Paying Living Expenses: Not hard at all  Food Insecurity: No Food Insecurity (07/23/2024)    Received from [facility]    Hunger Vital Sign     Within the past 12 months, you worried that your food would run out before you got the money to buy more.: Never true     Within the past 12 months, the food you bought just didn't last and you didn't have money to get more.: Never true  Transportation Needs: No Transportation Needs (07/23/2024)    Received from [facility] - Transportation     In the past 12 months, has lack of transportation kept you from medical appointments or from getting medications?: No     In the past 12 months, has lack of transportation kept you from meetings, work, or from getting things needed for daily living?: No  Physical Activity: Insufficiently Active (01/13/2024)    Received from [facility]    Exercise Vital Sign     On average, how many days per week do you engage in moderate to strenuous exercise (like a brisk walk)?: 3 days     On average, how many minutes do you engage in exercise at this level?: 40 min  Stress: No Stress Concern Present (01/13/2024)    Received from [facility] - Occupational Stress Questionnaire     Feeling of Stress : Not at all  Social Connections: Moderately Integrated (01/13/2024)    Received from [facility]    Social Connection and Isolation Panel     In a typical week, how many  times do you talk on the phone with family, friends, or neighbors?: More than three times a week     How often do you get together with friends or relatives?: More than three times a week     How often do you attend church or religious services?: More than 4 times per year     Do you belong to any clubs or organizations such as church groups, unions, fraternal or athletic groups, or school groups?: Yes     How often do you attend meetings of the clubs or organizations you belong to?: More than 4 times per year     Are you married, widowed, divorced, separated, never married, or living with a partner?: Divorced  Housing Stability: Unknown (07/13/2024)    Housing Stability Vital Sign     Homeless in the Last Year: No        Objective:         Vitals:    08/03/24 1211  PainSc: 0-No pain    There is no height or weight on file to calculate BMI.   Head:   Normocephalic and atraumatic.  Eyes:    Conjunctivae are normal. Pupils are equal, round, and reactive to light. No scleral icterus.  Neck:   Normal range of motion. Neck supple.  Resp:No respiratory distress, normal effort. Breast: 5 cm mobile axillary/breast axillary tail mass.  Nontender.  No overlying skin changes.  Remainder of breast benign. Neurological: Alert and oriented to person, place, and time. Coordination normal.  Skin:    Skin is warm and dry. No rash noted. No diaphoretic. No erythema. No pallor.  Psychiatric: Normal mood and affect. Normal behavior. Judgment and thought  content normal.      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 PET scan showed no other  abnormalities. - Scheduled surgical excision with margin. - Would plan to remove any enlarged lymph nodes seen at the time of surgery along with the mass if enlarged nodes found. - Use dissolvable stitches and glue for closure. - Consider drain placement if defect large. - Refer to physical therapy pre- and post-op. - Provide breast binder post-op. - Advise no heavy lifting or strenuous activity for 1-2 weeks post-op. - Schedule surgery before Christmas.   The surgical procedure was described to the patient and daughter   We discussed the risks bleeding, infection, damage to other structures, need for further procedures/surgeries.  We discussed the risk of seroma.  The patient was advised that we may need to go back to surgery for additional tissue to obtain negative margins or for additional lymph nodes . The patient was advised that these are the most common complications, but that others can occur as well. I discussed the risk of alteration in breast contour or size.  I discussed risk of chronic pain.  There are rare instances of heart/lung issues post op as well as blood clots.      They were advised against taking aspirin or other anti-inflammatory agents/blood thinners the week before surgery.     The risks and benefits of the procedure were described to the patient and she wishes to proceed.

## 2024-08-21 NOTE — Progress Notes (Signed)
 Anesthesia Chart Review:  Case: [id number] Date/Time: 08/23/24 1430   Procedure: EXCISION, MASS, UPPER EXTREMITY (Left: Axilla) - EXCISION OF LEFT AXILLARY MASS   Anesthesia type: General   Pre-op diagnosis: SQUAMOUS CELL CARCINOMA LEFT AXILLA   [facility] ROOM 12 / [facility]   Surgeons: Aron Shoulders, MD       DISCUSSION: Patient is a 73 year old female scheduled for the above procedure. She has prior skin cancer. Left axillary mass noted on recent mammogram. 07/02/2024 left axillary LN biopsy + metastatic carcinoma. Per Dr. Dia H&P: Left axillary lymph node mass likely due to squamous cell carcinoma, not breast cancer. PET scan showed no other abnormalities. - Scheduled surgical excision with margin. - Would plan to remove any enlarged lymph nodes seen at the time of surgery along with the mass if enlarged nodes found.  Other history includes never smoker, HTN, meningioma (s/p right pterional craniotomy for meningioma WHO grade 1 resection 09/11/2020), skin cancer (BCC, SCC, melanoma).  BP 172/83->156/72 at PAT. She is no long on antihypertensives, but reported BP monitoring by primary care and at home. Home BP readings ~ 130s/70s. She denied CV symptoms. CAC of 0 on 06/28/2024. EKG showed NSR.   Anesthesia team to evaluate on the day of surgery. CBC and BMET were normal.   VS: BP (!) 156/72   Pulse 72   Temp 36.8 C   Resp 16   Ht 5' 1 (1.549 m)   Wt 66.2 kg   SpO2 98%   BMI 27.57 kg/m  BP Readings from Last 3 Encounters:  08/20/24 (!) 156/72  07/16/24 (!) 181/85  05/28/24 (!) 145/82     PROVIDERS: Jolinda [name] [name], DO is PCP  [name], MD is HEM-ONC  LABS: Labs reviewed: Acceptable for surgery. (all labs ordered are listed, but only abnormal results are displayed)  Labs Reviewed  BASIC METABOLIC PANEL WITH GFR  CBC     IMAGES: PET scan 07/30/2024: IMPRESSION: 1. Left lateral breast/axillary bilobed mass measuring 5.2 x 3.4 cm with maximum SUV  15.8, compatible with malignancy. 2. Mild cardiomegaly. 3. Aortic atherosclerosis. 4. Cholelithiasis. 5. Grade 1 anterolisthesis at C4-5.   CXR 05/28/2024: FINDINGS: - LUNGS AND PLEURA: No focal pulmonary opacity. No pulmonary edema. No pleural effusion. No pneumothorax. - HEART AND MEDIASTINUM: No acute abnormality of the cardiac and mediastinal silhouettes. - BONES AND SOFT TISSUES: Left shoulder arthroplasty. Degenerative changes noted within the thoracic spine. Small gallstones identified. IMPRESSION: 1. No acute process.  MRI brain 04/23/2023: IMPRESSION: 1. Previous right sphenoid wing meningioma resection. No evidence of residual or recurrent tumor. 2. Chronic volume loss and gliosis in the right temporal tip and inferior right frontal lobe. Background pattern of chronic small-vessel ischemic change of the cerebral hemispheric white matter.   EKG: 08/20/2024: Normal sinus rhythm   CV: CT Coronary Calcium  Scoring 06/28/2024: IMPRESSION: Coronary calcium  score of 0.   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   sees Dr Ivin   Dysuria    Frequency of urination    Hematuria    History of kidney stones    Hypertension    Melanoma (HCC)    Meningioma (HCC) 09/04/2020   Osteoporosis    Renal calculus, bilateral    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   Urgency of urination    Wears glasses  Past Surgical History:  Procedure Laterality Date   APPLICATION OF CRANIAL NAVIGATION Right 09/11/2020   Procedure: APPLICATION OF CRANIAL NAVIGATION;  Surgeon: Debby Dorn MATSU, MD;  Location: [facility] OR;  Service: Neurosurgery;  Laterality: Right;   CRANIOTOMY Right 09/11/2020   Procedure: Right Pterional Craniotomy for resection of anterior skull base mass with Brain Lab;  Surgeon: Debby Dorn MATSU, MD;  Location: [facility] OR;  Service: Neurosurgery;  Laterality: Right;    CYSTOSCOPY WITH RETROGRADE PYELOGRAM, URETEROSCOPY AND STENT PLACEMENT Bilateral 08/11/2013   Procedure: CYSTOSCOPY WITH RETROGRADE PYELOGRAM, URETEROSCOPY AND STENT PLACEMENT;  Surgeon: Ricardo Likens, MD;  Location: [facility] ORS;  Service: Urology;  Laterality: Bilateral;   CYSTOSCOPY WITH URETEROSCOPY AND STENT PLACEMENT Bilateral 08/29/2013   Procedure: CYSTOSCOPY WITH BILATERAL URETEROSCOPY AND STENT REPLACEMENTS, stone extraction;  Surgeon: Ricardo Likens, MD;  Location: [facility];  Service: Urology;  Laterality: Bilateral;   CYSTOSCOPY/RETROGRADE/URETEROSCOPY/STONE EXTRACTION WITH BASKET Bilateral 01/31/2016   Procedure: CYSTOSCOPY/BILATERAL RETROGRADE PYELOGRAMS/BILATERAL URETEROSCOPY/BILATERAL STONE EXTRACTION WITH BASKET/RIGHT URETERAL STENT PLACEMENT;  Surgeon: Gretel Ferrara, MD;  Location: [facility];  Service: Urology;  Laterality: Bilateral;   HEMIARTHROPLASTY SHOULDER FRACTURE Left 09/20/1992   HOLMIUM LASER APPLICATION Bilateral 08/29/2013   Procedure: HOLMIUM LASER APPLICATION;  Surgeon: Ricardo Likens, MD;  Location: [facility];  Service: Urology;  Laterality: Bilateral;    MEDICATIONS:  acetaminophen  (TYLENOL ) 500 MG tablet   albuterol  (VENTOLIN  HFA) 108 (90 Base) MCG/ACT inhaler   alendronate  (FOSAMAX ) 70 MG tablet   azelastine  (ASTELIN ) 0.1 % nasal spray   Calcium  Carbonate-Vit D-Min (CALCIUM  1200 PO)   cyclobenzaprine  (FLEXERIL ) 5 MG tablet   fluorouracil (EFUDEX) 5 % cream   Glycerin -Hypromellose-PEG 400 0.2-0.2-1 % SOLN   ibuprofen  (ADVIL ) 600 MG tablet   indapamide (LOZOL) 1.25 MG tablet   Multiple Vitamin (MULTIVITAMIN WITH MINERALS) TABS tablet   rosuvastatin  (CRESTOR ) 10 MG tablet   No current facility-administered medications for this encounter.    Isaiah Ruder, PA-C Surgical Short Stay/Anesthesiology [facility] Phone [phone] [facility] Phone [phone] 08/21/2024 3:31 PM

## 2024-08-21 NOTE — Anesthesia Preprocedure Evaluation (Signed)
 Anesthesia Evaluation  Patient identified by MRN, date of birth, ID band Patient awake    Reviewed: Allergy & Precautions, H&P , NPO status , Patient's Chart, lab work & pertinent test results  Airway Mallampati: II  TM Distance: >3 FB Neck ROM: Full    Dental no notable dental hx. (+) Edentulous Upper, Edentulous Lower, Dental Advisory Given   Pulmonary neg pulmonary ROS   Pulmonary exam normal breath sounds clear to auscultation       Cardiovascular hypertension, Pt. on medications  Rhythm:Regular Rate:Normal     Neuro/Psych negative neurological ROS  negative psych ROS   GI/Hepatic negative GI ROS, Neg liver ROS,,,  Endo/Other  negative endocrine ROS    Renal/GU Renal disease  negative genitourinary   Musculoskeletal  (+) Arthritis , Osteoarthritis,    Abdominal   Peds  Hematology  (+) Blood dyscrasia, anemia   Anesthesia Other Findings   Reproductive/Obstetrics negative OB ROS                              Anesthesia Physical Anesthesia Plan  ASA: 2  Anesthesia Plan: General   Post-op Pain Management: Tylenol  PO (pre-op)*   Induction: Intravenous  PONV Risk Score and Plan: 4 or greater and Ondansetron , Dexamethasone  and Treatment may vary due to age or medical condition  Airway Management Planned: LMA  Additional Equipment:   Intra-op Plan:   Post-operative Plan: Extubation in OR  Informed Consent: I have reviewed the patients History and Physical, chart, labs and discussed the procedure including the risks, benefits and alternatives for the proposed anesthesia with the patient or authorized representative who has indicated his/her understanding and acceptance.     Dental advisory given  Plan Discussed with: CRNA  Anesthesia Plan Comments: (PAT note written 08/21/2024 by Daviona Herbert, PA-C.  )         Anesthesia Quick Evaluation

## 2024-08-22 ENCOUNTER — Inpatient Hospital Stay: Admitting: Hematology and Oncology

## 2024-08-22 DIAGNOSIS — M9901 Segmental and somatic dysfunction of cervical region: Secondary | ICD-10-CM | POA: Diagnosis not present

## 2024-08-22 DIAGNOSIS — M62830 Muscle spasm of back: Secondary | ICD-10-CM | POA: Diagnosis not present

## 2024-08-22 DIAGNOSIS — M9903 Segmental and somatic dysfunction of lumbar region: Secondary | ICD-10-CM | POA: Diagnosis not present

## 2024-08-22 DIAGNOSIS — M9902 Segmental and somatic dysfunction of thoracic region: Secondary | ICD-10-CM | POA: Diagnosis not present

## 2024-08-23 ENCOUNTER — Ambulatory Visit (HOSPITAL_COMMUNITY)
Admission: RE | Admit: 2024-08-23 | Discharge: 2024-08-23 | Disposition: A | Attending: General Surgery | Admitting: General Surgery

## 2024-08-23 ENCOUNTER — Other Ambulatory Visit: Payer: Self-pay

## 2024-08-23 ENCOUNTER — Encounter (HOSPITAL_COMMUNITY): Payer: Self-pay | Admitting: General Surgery

## 2024-08-23 ENCOUNTER — Encounter (HOSPITAL_COMMUNITY): Admission: RE | Disposition: A | Payer: Self-pay | Source: Home / Self Care | Attending: General Surgery

## 2024-08-23 ENCOUNTER — Ambulatory Visit (HOSPITAL_COMMUNITY): Admitting: Anesthesiology

## 2024-08-23 ENCOUNTER — Ambulatory Visit (HOSPITAL_COMMUNITY): Payer: Self-pay | Admitting: Vascular Surgery

## 2024-08-23 DIAGNOSIS — M810 Age-related osteoporosis without current pathological fracture: Secondary | ICD-10-CM | POA: Diagnosis not present

## 2024-08-23 DIAGNOSIS — C44529 Squamous cell carcinoma of skin of other part of trunk: Secondary | ICD-10-CM | POA: Diagnosis not present

## 2024-08-23 DIAGNOSIS — C773 Secondary and unspecified malignant neoplasm of axilla and upper limb lymph nodes: Secondary | ICD-10-CM | POA: Diagnosis present

## 2024-08-23 DIAGNOSIS — C792 Secondary malignant neoplasm of skin: Secondary | ICD-10-CM | POA: Diagnosis not present

## 2024-08-23 DIAGNOSIS — Z79899 Other long term (current) drug therapy: Secondary | ICD-10-CM | POA: Diagnosis not present

## 2024-08-23 DIAGNOSIS — C50612 Malignant neoplasm of axillary tail of left female breast: Secondary | ICD-10-CM | POA: Diagnosis not present

## 2024-08-23 DIAGNOSIS — Z85820 Personal history of malignant melanoma of skin: Secondary | ICD-10-CM | POA: Diagnosis not present

## 2024-08-23 DIAGNOSIS — Z1732 Human epidermal growth factor receptor 2 negative status: Secondary | ICD-10-CM | POA: Diagnosis not present

## 2024-08-23 DIAGNOSIS — N6332 Unspecified lump in axillary tail of the left breast: Secondary | ICD-10-CM | POA: Diagnosis not present

## 2024-08-23 DIAGNOSIS — Z85828 Personal history of other malignant neoplasm of skin: Secondary | ICD-10-CM | POA: Diagnosis not present

## 2024-08-23 HISTORY — PX: EXCISION, MASS, UPPER EXTREMITY: SHX7567

## 2024-08-23 SURGERY — EXCISION, MASS, UPPER EXTREMITY
Anesthesia: General | Site: Axilla | Laterality: Left

## 2024-08-23 MED ORDER — ROCURONIUM BROMIDE 100 MG/10ML IV SOLN
INTRAVENOUS | Status: DC | PRN
  Administered 2024-08-23: 40 mg via INTRAVENOUS

## 2024-08-23 MED ORDER — SUGAMMADEX SODIUM 200 MG/2ML IV SOLN
INTRAVENOUS | Status: DC | PRN
  Administered 2024-08-23: 200 mg via INTRAVENOUS

## 2024-08-23 MED ORDER — FENTANYL CITRATE (PF) 100 MCG/2ML IJ SOLN
25.0000 ug | INTRAMUSCULAR | Status: DC | PRN
  Administered 2024-08-23 (×2): 25 ug via INTRAVENOUS

## 2024-08-23 MED ORDER — LIDOCAINE HCL (PF) 1 % IJ SOLN
INTRAMUSCULAR | Status: AC
  Filled 2024-08-23: qty 30

## 2024-08-23 MED ORDER — CHLORHEXIDINE GLUCONATE 0.12 % MT SOLN
OROMUCOSAL | Status: AC
  Administered 2024-08-23: 15 mL via OROMUCOSAL
  Filled 2024-08-23: qty 15

## 2024-08-23 MED ORDER — CHLORHEXIDINE GLUCONATE 0.12 % MT SOLN: 15.0000 mL | Freq: Once | OROMUCOSAL | Status: AC

## 2024-08-23 MED ORDER — ACETAMINOPHEN 500 MG PO TABS
1000.0000 mg | ORAL_TABLET | ORAL | Status: AC
  Administered 2024-08-23: 1000 mg via ORAL

## 2024-08-23 MED ORDER — 0.9 % SODIUM CHLORIDE (POUR BTL) OPTIME
TOPICAL | Status: DC | PRN
  Administered 2024-08-23: 1000 mL

## 2024-08-23 MED ORDER — FENTANYL CITRATE (PF) 250 MCG/5ML IJ SOLN
INTRAMUSCULAR | Status: DC | PRN
  Administered 2024-08-23 (×2): 50 ug via INTRAVENOUS

## 2024-08-23 MED ORDER — PHENYLEPHRINE 80 MCG/ML (10ML) SYRINGE FOR IV PUSH (FOR BLOOD PRESSURE SUPPORT)
PREFILLED_SYRINGE | INTRAVENOUS | Status: DC | PRN
  Administered 2024-08-23: 80 ug via INTRAVENOUS

## 2024-08-23 MED ORDER — ONDANSETRON HCL 4 MG/2ML IJ SOLN
INTRAMUSCULAR | Status: DC | PRN
  Administered 2024-08-23: 4 mg via INTRAVENOUS

## 2024-08-23 MED ORDER — BUPIVACAINE-EPINEPHRINE (PF) 0.25% -1:200000 IJ SOLN
INTRAMUSCULAR | Status: AC
  Filled 2024-08-23: qty 30

## 2024-08-23 MED ORDER — LIDOCAINE HCL 1 % IJ SOLN
INTRAMUSCULAR | Status: DC | PRN
  Administered 2024-08-23: 40 mL via INTRAMUSCULAR

## 2024-08-23 MED ORDER — FENTANYL CITRATE (PF) 100 MCG/2ML IJ SOLN
INTRAMUSCULAR | Status: AC
  Filled 2024-08-23: qty 2

## 2024-08-23 MED ORDER — LACTATED RINGERS IV SOLN: INTRAVENOUS | Status: DC

## 2024-08-23 MED ORDER — CEFAZOLIN SODIUM-DEXTROSE 2-4 GM/100ML-% IV SOLN
2.0000 g | INTRAVENOUS | Status: AC
  Administered 2024-08-23: 2 g via INTRAVENOUS

## 2024-08-23 MED ORDER — CHLORHEXIDINE GLUCONATE CLOTH 2 % EX PADS: 6.0000 | MEDICATED_PAD | Freq: Once | CUTANEOUS | Status: DC

## 2024-08-23 MED ORDER — CEFAZOLIN SODIUM-DEXTROSE 2-4 GM/100ML-% IV SOLN
INTRAVENOUS | Status: AC
  Filled 2024-08-23: qty 100

## 2024-08-23 MED ORDER — PHENYLEPHRINE HCL-NACL 20-0.9 MG/250ML-% IV SOLN
INTRAVENOUS | Status: DC | PRN
  Administered 2024-08-23: 35 ug/min via INTRAVENOUS

## 2024-08-23 MED ORDER — LIDOCAINE 2% (20 MG/ML) 5 ML SYRINGE
INTRAMUSCULAR | Status: DC | PRN
  Administered 2024-08-23: 60 mg via INTRAVENOUS
  Administered 2024-08-23: 40 mg via INTRAVENOUS
  Administered 2024-08-23: 100 mg via INTRAVENOUS

## 2024-08-23 MED ORDER — DEXAMETHASONE SOD PHOSPHATE PF 10 MG/ML IJ SOLN
INTRAMUSCULAR | Status: DC | PRN
  Administered 2024-08-23: 10 mg via INTRAVENOUS

## 2024-08-23 MED ORDER — ORAL CARE MOUTH RINSE: 15.0000 mL | Freq: Once | OROMUCOSAL | Status: AC

## 2024-08-23 MED ORDER — OXYCODONE HCL 5 MG PO TABS: 5.0000 mg | ORAL_TABLET | Freq: Four times a day (QID) | ORAL | 0 refills | Status: AC | PRN

## 2024-08-23 MED ORDER — PROPOFOL 10 MG/ML IV BOLUS
INTRAVENOUS | Status: DC | PRN
  Administered 2024-08-23: 100 mg via INTRAVENOUS
  Administered 2024-08-23: 40 mg via INTRAVENOUS

## 2024-08-23 MED ORDER — LACTATED RINGERS IV SOLN: INTRAVENOUS | Status: DC | PRN

## 2024-08-23 MED ORDER — ACETAMINOPHEN 500 MG PO TABS
1000.0000 mg | ORAL_TABLET | Freq: Once | ORAL | Status: DC
  Filled 2024-08-23: qty 2

## 2024-08-23 SURGICAL SUPPLY — 36 items
BAG COUNTER SPONGE SURGICOUNT (BAG) ×2 IMPLANT
BNDG COHESIVE 6X5 TAN ST LF (GAUZE/BANDAGES/DRESSINGS) ×2 IMPLANT
CANISTER SUCTION 3000ML PPV (SUCTIONS) IMPLANT
CHLORAPREP W/TINT 26 (MISCELLANEOUS) ×2 IMPLANT
CLIP TI LARGE 6 (CLIP) IMPLANT
CLIP TI MEDIUM 6 (CLIP) ×2 IMPLANT
CNTNR URN SCR LID CUP LEK RST (MISCELLANEOUS) IMPLANT
COVER MAYO STAND STRL (DRAPES) ×2 IMPLANT
COVER PROBE W GEL 5X96 (DRAPES) IMPLANT
COVER SURGICAL LIGHT HANDLE (MISCELLANEOUS) ×2 IMPLANT
DERMABOND ADVANCED .7 DNX12 (GAUZE/BANDAGES/DRESSINGS) ×2 IMPLANT
ELECTRODE REM PT RTRN 9FT ADLT (ELECTROSURGICAL) ×2 IMPLANT
GAUZE 4X4 16PLY ~~LOC~~+RFID DBL (SPONGE) ×2 IMPLANT
GAUZE SPONGE 4X4 12PLY STRL (GAUZE/BANDAGES/DRESSINGS) ×2 IMPLANT
GLOVE BIO SURGEON STRL SZ 6 (GLOVE) ×2 IMPLANT
GLOVE INDICATOR 6.5 STRL GRN (GLOVE) ×2 IMPLANT
GOWN STRL REUS W/ TWL LRG LVL3 (GOWN DISPOSABLE) ×2 IMPLANT
GOWN STRL REUS W/ TWL XL LVL3 (GOWN DISPOSABLE) ×4 IMPLANT
KIT BASIN OR (CUSTOM PROCEDURE TRAY) ×2 IMPLANT
KIT MARKER MARGIN INK (KITS) IMPLANT
KIT TURNOVER KIT B (KITS) ×2 IMPLANT
NDL FILTER BLUNT 18X1 1/2 (NEEDLE) IMPLANT
NDL HYPO 25GX1X1/2 BEV (NEEDLE) IMPLANT
PACK GENERAL/GYN (CUSTOM PROCEDURE TRAY) ×2 IMPLANT
PACK UNIVERSAL I (CUSTOM PROCEDURE TRAY) ×2 IMPLANT
PAD ARMBOARD POSITIONER FOAM (MISCELLANEOUS) ×4 IMPLANT
PENCIL SMOKE EVACUATOR (MISCELLANEOUS) ×2 IMPLANT
RETRACTOR ONETRAX LX 90X20 (MISCELLANEOUS) IMPLANT
SOLN 0.9% NACL POUR BTL 1000ML (IV SOLUTION) ×2 IMPLANT
STRIP CLOSURE SKIN 1/2X4 (GAUZE/BANDAGES/DRESSINGS) ×2 IMPLANT
SUT MON AB 4-0 PC3 18 (SUTURE) ×2 IMPLANT
SUT SILK 2 0 PERMA HAND 18 BK (SUTURE) ×2 IMPLANT
SUT VIC AB 3-0 SH 27X BRD (SUTURE) ×2 IMPLANT
SYR CONTROL 10ML LL (SYRINGE) IMPLANT
TOWEL GREEN STERILE (TOWEL DISPOSABLE) ×2 IMPLANT
TOWEL GREEN STERILE FF (TOWEL DISPOSABLE) ×2 IMPLANT

## 2024-08-23 NOTE — Interval H&P Note (Signed)
 History and Physical Interval Note:  08/23/2024 12:45 PM  [name] Page  has presented today for surgery, with the diagnosis of SQUAMOUS CELL CARCINOMA LEFT AXILLA.  The various methods of treatment have been discussed with the patient and family. After consideration of risks, benefits and other options for treatment, the patient has consented to  Procedure(s) with comments: EXCISION, MASS, UPPER EXTREMITY (Left) - EXCISION OF LEFT AXILLARY MASS as a surgical intervention.  The patient's history has been reviewed, patient examined, no change in status, stable for surgery.  I have reviewed the patient's chart and labs.  Questions were answered to the patient's satisfaction.     Jina Nephew

## 2024-08-23 NOTE — Op Note (Signed)
 PRE-OPERATIVE DIAGNOSIS: Malignant left axillary mass  POST-OPERATIVE DIAGNOSIS:  Same  PROCEDURE:  Procedure(s): Excision of malignant left axillary mass  SURGEON:  Surgeon(s): Jina Nephew, MD  ANESTHESIA:   regional and general  DRAINS: none   LOCAL MEDICATIONS USED:  BUPIVICAINE  and LIDOCAINE    SPECIMEN:  Source of Specimen:  left axillary mass  DISPOSITION OF SPECIMEN:  PATHOLOGY  COUNTS:  YES  DICTATION: .Dragon Dictation  PLAN OF CARE: Discharge to home after PACU  PATIENT DISPOSITION:  PACU - hemodynamically stable.  FINDINGS:  large firm 4 cm mass left axilla.  No additional palpable axillary nodes  EBL: min  PROCEDURE:  Patient was identified in the holding area and taken to the operating room where she was placed supine on the operating room table.  General anesthesia was induced.  The left arm and breast and chest were prepped and draped in sterile fashion.  A timeout was performed according to the surgical safety checklist.  When all was correct, we continued.    The axilla skin was anesthetized with local anesthetic.  A curvilinear incision was made at the axillary hairline.  Skin hooks were used to elevate the skin in order to create flaps around the mass.  The LMA began having a seal issue and anesthesia had to revise the airway.  This was successful.  The mass was dissected away from the surrounding tissues with cautery.  There were several large feeding vessels, and the mass went down to the chest wall.  The feeding vessels were clipped with hemoclips.  The mass seemed to involve several of the lateral fibers of the pectoralis and these were taken en bloc with the mass.  The mass was then marked with the margin marker paint kit.  The cavity was then examined.  Clips were placed on the cardinal directions of the cavity other than the anterior border.  The cavity was irrigated.  Hemostasis was meticulously achieved with the cautery.  The wound was irrigated and  reexamined several times to make sure that adequate hemostasis was ensured.  The surrounding skin was then infiltrated with additional local anesthetic.  The wound was then closed using interrupted 3-0 Vicryl deep dermal sutures and running Monocryl subcuticular suture.  The skin was then cleaned, dried, and dressed with Dermabond.  Gauze and a breast binder were placed to assist with compression and support of the breast.  The patient was allowed to emerge from anesthesia and taken to the PACU in stable condition.  Needle, sponge, and instrument counts were correct per protocol.

## 2024-08-23 NOTE — Anesthesia Procedure Notes (Signed)
 Procedure Name: Intubation Date/Time: 08/23/2024 2:25 PM  Performed by: Arvell Edsel [name], CRNAPre-anesthesia Checklist: Patient identified, Emergency Drugs available, Suction available, Patient being monitored and Timeout performed Patient Re-evaluated:Patient Re-evaluated prior to induction Oxygen Delivery Method: Circle system utilized Preoxygenation: Pre-oxygenation with 100% oxygen Induction Type: IV induction Ventilation: Mask ventilation without difficulty Laryngoscope Size: Glidescope and 3 Grade View: Grade I Tube type: Oral Tube size: 7.0 mm Number of attempts: 1 Airway Equipment and Method: Video-laryngoscopy and Patient positioned with wedge pillow Placement Confirmation: ETT inserted through vocal cords under direct vision, positive ETCO2 and breath sounds checked- equal and bilateral Secured at: 22 cm Tube secured with: Tape Dental Injury: Teeth and Oropharynx as per pre-operative assessment

## 2024-08-23 NOTE — Anesthesia Procedure Notes (Signed)
 Procedure Name: LMA Insertion Date/Time: 08/23/2024 1:57 PM  Performed by: Arvell Edsel [name], CRNAPre-anesthesia Checklist: Patient identified, Emergency Drugs available, Suction available, Patient being monitored and Timeout performed Patient Re-evaluated:Patient Re-evaluated prior to induction Oxygen Delivery Method: Circle system utilized Preoxygenation: Pre-oxygenation with 100% oxygen Induction Type: IV induction LMA: LMA inserted LMA Size: 4.0 Number of attempts: 1 Placement Confirmation: positive ETCO2 and breath sounds checked- equal and bilateral Tube secured with: Tape Dental Injury: Teeth and Oropharynx as per pre-operative assessment

## 2024-08-23 NOTE — Anesthesia Postprocedure Evaluation (Signed)
 Anesthesia Post Note  Patient: [name] Page  Procedure(s) Performed: EXCISION, MASS, UPPER EXTREMITY (Left: Axilla)     Patient location during evaluation: PACU Anesthesia Type: General Level of consciousness: awake and alert Pain management: pain level controlled Vital Signs Assessment: post-procedure vital signs reviewed and stable Respiratory status: spontaneous breathing, nonlabored ventilation and respiratory function stable Cardiovascular status: blood pressure returned to baseline and stable Postop Assessment: no apparent nausea or vomiting Anesthetic complications: no   No notable events documented.  Last Vitals:  Vitals:   08/23/24 1545 08/23/24 1600  BP: 132/66 (!) 117/59  Pulse: 63 62  Resp: 12 11  Temp:    SpO2: 96% 94%    Last Pain:  Vitals:   08/23/24 1552  TempSrc:   PainSc: 5                  Daryan Buell,W. EDMOND

## 2024-08-23 NOTE — Transfer of Care (Signed)
 Immediate Anesthesia Transfer of Care Note  Patient: [name] Page  Procedure(s) Performed: EXCISION, MASS, UPPER EXTREMITY (Left: Axilla)  Patient Location: PACU  Anesthesia Type:General  Level of Consciousness: awake, alert , oriented, and patient cooperative  Airway & Oxygen Therapy: Patient Spontanous Breathing and Patient connected to face mask oxygen  Post-op Assessment: Report given to RN, Post -op Vital signs reviewed and stable, Patient moving all extremities, and Patient moving all extremities X 4  Post vital signs: Reviewed and stable  Last Vitals:  Vitals Value Taken Time  BP 134/58 08/23/24 15:18  Temp    Pulse 69 08/23/24 15:22  Resp 14 08/23/24 15:22  SpO2 95 % 08/23/24 15:22  Vitals shown include unfiled device data.  Last Pain:  Vitals:   08/23/24 1318  TempSrc:   PainSc: 0-No pain         Complications: No notable events documented.

## 2024-08-23 NOTE — Discharge Instructions (Addendum)
 [facility] Office Phone Number [phone]  BREAST BIOPSY/ PARTIAL MASTECTOMY: POST OP INSTRUCTIONS  Always review your discharge instruction sheet given to you by the facility where your surgery was performed.  IF YOU HAVE DISABILITY OR FAMILY LEAVE FORMS, YOU MUST BRING THEM TO THE OFFICE FOR PROCESSING.  DO NOT GIVE THEM TO YOUR DOCTOR.  Take 2 tylenol  (acetominophen) three times a day for 3 days.  If you still have pain, add ibuprofen with food in between if able to take this (if you have kidney issues or stomach issues, do not take ibuprofen).  If both of those are not enough, add the narcotic pain pill.  If you find you are needing a lot of this overnight after surgery, call the next morning for a refill.    Prescriptions will not be filled after 5pm or on week-ends. Take your usually prescribed medications unless otherwise directed You should eat very light the first 24 hours after surgery, such as soup, crackers, pudding, etc.  Resume your normal diet the day after surgery. Most patients will experience some swelling and bruising in the breast.  Ice packs and a good support bra will help.  Swelling and bruising can take several days to resolve.  It is common to experience some constipation if taking pain medication after surgery.  Increasing fluid intake and taking a stool softener will usually help or prevent this problem from occurring.  A mild laxative (Milk of Magnesia or Miralax ) should be taken according to package directions if there are no bowel movements after 48 hours. Unless discharge instructions indicate otherwise, you may remove your bandages 48 hours after surgery, and you may shower at that time.  You may have steri-strips (small skin tapes) in place directly over the incision.  These strips should be left on the skin at least for for 7-10 days.    ACTIVITIES:  You may resume regular daily activities (gradually increasing) beginning the next day.  Wearing a  good support bra or sports bra (or the breast binder) minimizes pain and swelling.  You may have sexual intercourse when it is comfortable. No heavy lifting for 1-2 weeks (not over around 10 pounds).  You may drive when you no longer are taking prescription pain medication, you can comfortably wear a seatbelt, and you can safely maneuver your car and apply brakes. RETURN TO WORK:  __________3-14 days depending on job. _______________ [name] Page should see your doctor in the office for a follow-up appointment approximately two weeks after your surgery.  Your doctor's nurse will typically make your follow-up appointment when she calls you with your pathology report.  Expect your pathology report 3-4 business days after your surgery.  You may call to check if you do not hear from us  after three days.   WHEN TO CALL YOUR DOCTOR: Fever over 101.0 Nausea and/or vomiting. Extreme swelling or bruising. Continued bleeding from incision. Increased pain, redness, or drainage from the incision.  The clinic staff is available to answer your questions during regular business hours.  Please don't hesitate to call and ask to speak to one of the nurses for clinical concerns.  If you have a medical emergency, go to the nearest emergency room or call 911.  A surgeon from [facility] is always on call at the hospital.  For further questions, please visit [email or web address]

## 2024-08-24 ENCOUNTER — Encounter (HOSPITAL_COMMUNITY): Payer: Self-pay | Admitting: General Surgery

## 2024-08-27 LAB — SURGICAL PATHOLOGY

## 2024-08-28 ENCOUNTER — Ambulatory Visit: Payer: Self-pay | Admitting: General Surgery

## 2024-09-06 ENCOUNTER — Other Ambulatory Visit: Payer: Self-pay

## 2024-09-06 ENCOUNTER — Inpatient Hospital Stay

## 2024-09-06 ENCOUNTER — Inpatient Hospital Stay: Attending: Hematology and Oncology | Admitting: Hematology and Oncology

## 2024-09-06 VITALS — BP 174/84 | HR 65 | Temp 97.2°F | Resp 16 | Wt 146.0 lb

## 2024-09-06 DIAGNOSIS — C50612 Malignant neoplasm of axillary tail of left female breast: Secondary | ICD-10-CM | POA: Insufficient documentation

## 2024-09-06 DIAGNOSIS — C44521 Squamous cell carcinoma of skin of breast: Secondary | ICD-10-CM | POA: Diagnosis not present

## 2024-09-06 DIAGNOSIS — Z800 Family history of malignant neoplasm of digestive organs: Secondary | ICD-10-CM

## 2024-09-06 LAB — GENETIC SCREENING ORDER

## 2024-09-06 NOTE — Progress Notes (Signed)
 [facility] CONSULT NOTE  Patient Care Team: [name] [name] [name], DO as PCP - General (Family Medicine) [name] Page., MD as Consulting Physician (Urology) [name] Savannah, MD as Referring Physician (Dermatology) [name] Page as Physician Assistant (Chiropractic Medicine) [name] Ross Lacy Phebe, MD as Referring Physician (Optometry)  CHIEF COMPLAINTS/PURPOSE OF CONSULTATION:  Newly diagnosed breast cancer  HISTORY OF PRESENTING ILLNESS:  [name] Page 73 y.o. female is here because of recent diagnosis of left axillary mass  I reviewed her records extensively and collaborated the history with the patient.  SUMMARY OF ONCOLOGIC HISTORY: Oncology History   No problem history exists.   Discussed the use of AI scribe software for clinical note transcription with the patient, who gave verbal consent to proceed.  History of Present Illness [name] Page is a 73 year old female with a history of multiple skin cancers who presents with a mass in the lower axilla.  A mass in the lower axilla was discovered during a routine mammogram. It is not palpable in the breast but is located in the armpit area. She did not notice the mass prior to the mammogram.  She has a history of multiple squamous cell skin cancers, treated by excision, with the most recent treatment approximately six months ago. She undergoes regular skin checks every three months due to recurrence.  Her past medical history includes a benign meningioma, surgically removed four years ago, with no radiation therapy required. She continues to have annual MRIs to monitor for recurrence.  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 She exercises regularly, attending aerobics classes twice a week. She has a history of significant sun exposure during her youth, leading to frequent sunburns due to her fair skin.  No unexplained weight loss, blood in  stool, or significant changes in health aside from the current issue. She has a history of high blood pressure, particularly in medical settings, but monitors it at home. No history of diabetes, heart disease, or other cancers. She does not smoke or drink alcohol .   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 Pathology confirmed lymphatic spread. No prior radiation therapy has been administered.  She denies new or enlarging masses, fever, chills, night sweats, weight loss, abnormal bleeding, ecchymosis, or cutaneous symptoms. She has not initiated any new oncologic medications since surgery.   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   sees Dr Ivin   Dysuria    Frequency of urination    Hematuria    History of kidney stones    Hypertension    Melanoma (HCC)    Meningioma (HCC) 09/04/2020   Osteoporosis    Renal calculus, bilateral    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   Urgency of urination    Wears glasses     SURGICAL HISTORY: Past Surgical History:  Procedure Laterality Date   APPLICATION OF CRANIAL NAVIGATION Right 09/11/2020   Procedure: APPLICATION OF CRANIAL NAVIGATION;  Surgeon: [name] Dorn MATSU, MD;  Location: [facility] OR;  Service: Neurosurgery;  Laterality: Right;   CRANIOTOMY Right 09/11/2020   Procedure: Right Pterional Craniotomy for resection of anterior skull base mass with Brain Lab;  Surgeon: [name] Dorn MATSU, MD;  Location: [facility];  Service: Neurosurgery;  Laterality: Right;   CYSTOSCOPY WITH RETROGRADE PYELOGRAM, URETEROSCOPY  AND STENT PLACEMENT Bilateral 08/11/2013   Procedure: CYSTOSCOPY WITH RETROGRADE PYELOGRAM, URETEROSCOPY AND STENT PLACEMENT;  Surgeon: [name] Likens, MD;  Location: [facility];  Service: Urology;  Laterality: Bilateral;   CYSTOSCOPY WITH URETEROSCOPY AND STENT PLACEMENT Bilateral 08/29/2013   Procedure: CYSTOSCOPY WITH BILATERAL URETEROSCOPY AND STENT REPLACEMENTS, stone extraction;  Surgeon: [name] Likens, MD;  [place] [facility] [place];  Service: Urology;  Laterality: Bilateral;   CYSTOSCOPY/RETROGRADE/URETEROSCOPY/STONE EXTRACTION WITH BASKET Bilateral 01/31/2016   Procedure: CYSTOSCOPY/BILATERAL RETROGRADE PYELOGRAMS/BILATERAL URETEROSCOPY/BILATERAL STONE EXTRACTION WITH BASKET/RIGHT URETERAL STENT PLACEMENT;  Surgeon: [name] Ferrara, MD;  Location: [facility];  Service: Urology;  Laterality: Bilateral;   EXCISION, MASS, UPPER EXTREMITY Left 08/23/2024   Procedure: EXCISION, MASS, UPPER EXTREMITY;  Surgeon: [name] Shoulders, MD;  Location: [facility] OR;  Service: General;  Laterality: Left;  EXCISION OF LEFT AXILLARY MASS   HEMIARTHROPLASTY SHOULDER FRACTURE Left 09/20/1992   HOLMIUM LASER APPLICATION Bilateral 08/29/2013   Procedure: HOLMIUM LASER APPLICATION;  Surgeon: [name] Likens, MD;  Location: [facility];  Service: Urology;  Laterality: Bilateral;    SOCIAL HISTORY: Social History   Socioeconomic History   Marital status: Divorced    Spouse name: Not on file   Number of children: 1   Years of education: 12   Highest education level: 12th grade  Occupational History   Occupation: retired  Tobacco Use   Smoking status: Never   Smokeless tobacco: Never  Vaping Use   Vaping status: Never Used  Substance and Sexual Activity   Alcohol  use: No   Drug use: No   Sexual activity: Not Currently    Birth control/protection: Abstinence  Other Topics Concern   Not on file  Social History Narrative   Patient resides locally.  She is currently separated from her spouse.   She is living in a mobile home near her daughter and 2 grandchildren and her father.   Social Drivers of Health   Tobacco Use: Low Risk (08/23/2024)   Patient History    Smoking Tobacco Use: Never    Smokeless Tobacco Use: Never    Passive Exposure: Not on file  Financial Resource Strain: Low Risk (01/13/2024)   Overall Financial Resource Strain (CARDIA)    Difficulty of Paying Living Expenses: Not hard at all  Food Insecurity: No Food Insecurity (07/23/2024)   Epic    Worried About Programme Researcher, Broadcasting/film/video in the Last Year: Never true    Ran Out of Food in the Last Year: Never true  Transportation Needs: No Transportation Needs (07/23/2024)   Epic    Lack of Transportation (Medical): No    Lack of Transportation (Non-Medical): No  Physical Activity: Insufficiently Active (01/13/2024)   Exercise Vital Sign    Days of Exercise per Week: 3 days    Minutes of Exercise per Session: 40 min  Stress: No Stress Concern Present (01/13/2024)   [employer] of Occupational Health - Occupational Stress Questionnaire    Feeling of Stress : Not at all  Social Connections: Moderately Integrated (01/13/2024)   Social Connection and Isolation Panel    Frequency of Communication with Friends and Family: More than three times a week    Frequency of Social  Gatherings with Friends and Family: More than three times a week    Attends Religious Services: More than 4 times per year    Active Member of Clubs or Organizations: Yes    Attends Banker Meetings: More than 4 times per year    Marital Status: Divorced  Intimate Partner Violence: Not At Risk (01/13/2024)   Humiliation, Afraid, Rape, and Kick questionnaire    Fear of Current or Ex-Partner: No    Emotionally Abused: No    Physically Abused: No    Sexually Abused: No  Depression (PHQ2-9): Low Risk (02/14/2024)   Depression (PHQ2-9)    PHQ-2 Score: 0  Alcohol  Screen: Low Risk (01/13/2024)   Alcohol  Screen    Last Alcohol  Screening Score  (AUDIT): 0  Housing: Low Risk (07/23/2024)   Epic    Unable to Pay for Housing in the Last Year: No    Number of Times Moved in the Last Year: 0    Homeless in the Last Year: No  Utilities: Not At Risk (07/23/2024)   Epic    Threatened with loss of utilities: No  Health Literacy: Adequate Health Literacy (01/13/2024)   B1300 Health Literacy    Frequency of need for help with medical instructions: Never    FAMILY HISTORY: Family History  Problem Relation Age of Onset   Stomach cancer Mother 51       Bile Duct Cancer Mets to liver   Atrial fibrillation Father    Hyperlipidemia Father    Hypertension Father    Colon polyps Father    Arthritis Father    COPD Father    Vision loss Father    Hypertension Sister    Hyperlipidemia Sister    Thyroid  disease Sister    Esophageal cancer Neg Hx    Pancreatic cancer Neg Hx     ALLERGIES:  is allergic to sulfa antibiotics.  MEDICATIONS:  Current Outpatient Medications  Medication Sig Dispense Refill   acetaminophen  (TYLENOL ) 500 MG tablet Take 500 mg by mouth every 6 (six) hours as needed for moderate pain (pain score 4-6).     albuterol  (VENTOLIN  HFA) 108 (90 Base) MCG/ACT inhaler Inhale 2 puffs into the lungs every 6 (six) hours as needed for wheezing or shortness of breath. 8 g 0   alendronate  (FOSAMAX ) 70 MG tablet Take 1 tablet (70 mg total) by mouth every 7 (seven) days. 12 tablet 4   azelastine  (ASTELIN ) 0.1 % nasal spray Place 1 spray into both nostrils 2 (two) times daily. (Patient taking differently: Place 1 spray into both nostrils 2 (two) times daily as needed for allergies.) 30 mL 12   Calcium  Carbonate-Vit D-Min (CALCIUM  1200 PO) Take 1 tablet by mouth daily.     cyclobenzaprine  (FLEXERIL ) 5 MG tablet Take 1 tablet (5 mg total) by mouth 3 (three) times daily as needed for muscle spasms. 30 tablet 0   fluorouracil (EFUDEX) 5 % cream Apply topically 2 (two) times a week.     Glycerin -Hypromellose-PEG 400 0.2-0.2-1 % SOLN Apply  1 drop to eye 2 (two) times daily as needed (dry eyes).      ibuprofen  (ADVIL ) 600 MG tablet Take 1 tablet (600 mg total) by mouth every 8 (eight) hours as needed for moderate pain (pain score 4-6). 90 tablet 0   indapamide (LOZOL) 1.25 MG tablet Take 1.25 mg by mouth daily. (Patient not taking: Reported on 05/28/2024)     Multiple Vitamin (MULTIVITAMIN WITH MINERALS) TABS tablet Take 1 tablet by  mouth daily.     oxyCODONE  (OXY IR/ROXICODONE ) 5 MG immediate release tablet Take 1 tablet (5 mg total) by mouth every 6 (six) hours as needed for severe pain (pain score 7-10). 10 tablet 0   rosuvastatin  (CRESTOR ) 10 MG tablet Take 1 tablet (10 mg total) by mouth daily. 90 tablet 3   No current facility-administered medications for this visit.    All other systems were reviewed with the patient and are negative.  PHYSICAL EXAMINATION: ECOG PERFORMANCE STATUS: 0 - Asymptomatic  Vitals:   09/06/24 1321  BP: (!) 174/84  Pulse: 65  Resp: 16  Temp: (!) 97.2 F (36.2 C)  SpO2: 99%   Filed Weights   09/06/24 1321  Weight: 146 lb (66.2 kg)    GENERAL:alert, no distress and comfortable Left axilla scar appears to be healing well.  LABORATORY DATA:  I have reviewed the data as listed Lab Results  Component Value Date   WBC 5.4 08/20/2024   HGB 13.1 08/20/2024   HCT 41.9 08/20/2024   MCV 87.7 08/20/2024   PLT 189 08/20/2024   Lab Results  Component Value Date   NA 145 08/20/2024   K 4.1 08/20/2024   CL 108 08/20/2024   CO2 24 08/20/2024    RADIOGRAPHIC STUDIES: I have personally reviewed the radiological reports and agreed with the findings in the report.  ASSESSMENT AND PLAN:   Assessment and Plan Assessment & Plan  Recurrent cutaneous squamous cell carcinoma of the left breast Recurrent high-risk cutaneous squamous cell carcinoma post-surgical resection with lymph node involvement. High risk for further recurrence. Adjuvant radiotherapy standard of care.  - Referred to  radiation oncology (Dr. Izell) for evaluation and initiation of adjuvant radiotherapy. - Discussed potential for adjuvant immunotherapy with cemiplimab following completion of radiation, pending confirmation of some addditional pathology information. - Reviewed C-POST trial data supporting adjuvant cemiplimab in high-risk cutaneous squamous cell carcinoma after surgery and radiotherapy. - Planned to monitor and manage potential cemiplimab toxicities if initiated, including fatigue, skin rash, hypothyroidism, and rare serious adverse events. - Explained cemiplimab administration schedule: IV  350 mg every 3 weeks for 4 cycles, then every 700 mg 6 weeks for up to 1 year, with ongoing assessment of tolerance and option to discontinue for intolerable side effects. - Scheduled follow-up visit for February after completion of radiation therapy. - Instructed her to call with any changes or concerns prior to next visit.   Time spent: 30 min All questions were answered. The patient knows to call the clinic with any problems, questions or concerns.    Amber Stalls, MD 09/06/2024

## 2024-09-07 ENCOUNTER — Inpatient Hospital Stay

## 2024-09-07 DIAGNOSIS — C44521 Squamous cell carcinoma of skin of breast: Secondary | ICD-10-CM

## 2024-09-07 DIAGNOSIS — C439 Malignant melanoma of skin, unspecified: Secondary | ICD-10-CM

## 2024-09-07 DIAGNOSIS — Z801 Family history of malignant neoplasm of trachea, bronchus and lung: Secondary | ICD-10-CM

## 2024-09-07 DIAGNOSIS — Z800 Family history of malignant neoplasm of digestive organs: Secondary | ICD-10-CM

## 2024-09-07 NOTE — Progress Notes (Unsigned)
 REFERRING PROVIDER: [name], MD [address] [place],  [place] [address]  PRIMARY PROVIDER:  Jolinda [name] [name], DO  PRIMARY REASON FOR VISIT:  No diagnosis found.  HISTORY OF PRESENT ILLNESS:   [name] Page, a 73 y.o. female, was seen for a [place] cancer genetics consultation at the request of [name] Page, due to a family history of bile duct cancer.  [name] Page presents to clinic today to discuss the possibility of a hereditary predisposition to cancer, genetic testing, and to further clarify her future cancer risks, as well as potential cancer risks for family members.   CANCER HISTORY:   [name] Page is a 73 y.o. female with no personal history of cancer.    RISK FACTORS:  Menarche was at age 55.  First live birth at age 53.  OCP use for approximately 2 years.  Oophorectomy: no.  Hysterectomy: no.  Menopausal status: postmenopausal.  HRT use: 0 years. Colonoscopy: no; Cologuard. Mammogram within the last year: yes. Number of breast biopsies: Lymph nodes mass removed. Up to date with pelvic exams: no, not recenltyu. 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   Urgency of urination    Wears glasses    Past Surgical History:  Procedure Laterality Date   APPLICATION OF CRANIAL NAVIGATION Right 09/11/2020   Procedure: APPLICATION OF CRANIAL NAVIGATION;  Surgeon: Debby Dorn MATSU, MD;  Location: [facility] OR;  Service: Neurosurgery;  Laterality: Right;   CRANIOTOMY Right 09/11/2020   Procedure: Right Pterional  Craniotomy for resection of anterior skull base mass with Brain Lab;  Surgeon: Debby Dorn MATSU, MD;  Location: [facility] OR;  Service: Neurosurgery;  Laterality: Right;   CYSTOSCOPY WITH RETROGRADE PYELOGRAM, URETEROSCOPY AND STENT PLACEMENT Bilateral 08/11/2013   Procedure: CYSTOSCOPY WITH RETROGRADE PYELOGRAM, URETEROSCOPY AND STENT PLACEMENT;  Surgeon: Ricardo Likens, MD;  Location: [facility] ORS;  Service: Urology;  Laterality: Bilateral;   CYSTOSCOPY WITH URETEROSCOPY AND STENT PLACEMENT Bilateral 08/29/2013   Procedure: CYSTOSCOPY WITH BILATERAL URETEROSCOPY AND STENT REPLACEMENTS, stone extraction;  Surgeon: Ricardo Likens, MD;  Location: [facility];  Service: Urology;  Laterality: Bilateral;   CYSTOSCOPY/RETROGRADE/URETEROSCOPY/STONE EXTRACTION WITH BASKET Bilateral 01/31/2016   Procedure: CYSTOSCOPY/BILATERAL RETROGRADE PYELOGRAMS/BILATERAL URETEROSCOPY/BILATERAL STONE EXTRACTION WITH BASKET/RIGHT URETERAL STENT PLACEMENT;  Surgeon: Gretel Ferrara, MD;  Location: [facility] ORS;  Service: Urology;  Laterality: Bilateral;   EXCISION, MASS, UPPER EXTREMITY Left 08/23/2024   Procedure: EXCISION, MASS, UPPER EXTREMITY;  Surgeon: Aron Shoulders, MD;  Location: [facility] OR;  Service: General;  Laterality: Left;  EXCISION OF LEFT AXILLARY MASS   HEMIARTHROPLASTY SHOULDER FRACTURE Left 09/20/1992   HOLMIUM LASER APPLICATION Bilateral 08/29/2013   Procedure: HOLMIUM LASER APPLICATION;  Surgeon: Ricardo Likens, MD;  Location: [facility];  Service: Urology;  Laterality: Bilateral;   Social History   Socioeconomic History   Marital status: Divorced    Spouse name: Not on file   Number of children: 1   Years  of education: 12   Highest education level: 12th grade  Occupational History   Occupation: retired  Tobacco Use   Smoking status: Never   Smokeless tobacco: Never  Vaping Use   Vaping status: Never Used  Substance and Sexual Activity   Alcohol  use: No   Drug use: No   Sexual  activity: Not Currently    Birth control/protection: Abstinence  Other Topics Concern   Not on file  Social History Narrative   Patient resides locally.  She is currently separated from her spouse.  She is living in a mobile home near her daughter and 2 grandchildren and her father.   Social Drivers of Health   Tobacco Use: Low Risk (08/23/2024)   Patient History    Smoking Tobacco Use: Never    Smokeless Tobacco Use: Never    Passive Exposure: Not on file  Financial Resource Strain: Low Risk (01/13/2024)   Overall Financial Resource Strain (CARDIA)    Difficulty of Paying Living Expenses: Not hard at all  Food Insecurity: No Food Insecurity (07/23/2024)   Epic    Worried About Programme Researcher, Broadcasting/film/video in the Last Year: Never true    Ran Out of Food in the Last Year: Never true  Transportation Needs: No Transportation Needs (07/23/2024)   Epic    Lack of Transportation (Medical): No    Lack of Transportation (Non-Medical): No  Physical Activity: Insufficiently Active (01/13/2024)   Exercise Vital Sign    Days of Exercise per Week: 3 days    Minutes of Exercise per Session: 40 min  Stress: No Stress Concern Present (01/13/2024)   [employer] of Occupational Health - Occupational Stress Questionnaire    Feeling of Stress : Not at all  Social Connections: Moderately Integrated (01/13/2024)   Social Connection and Isolation Panel    Frequency of Communication with Friends and Family: More than three times a week    Frequency of Social Gatherings with Friends and Family: More than three times a week    Attends Religious Services: More than 4 times per year    Active Member of Clubs or Organizations: Yes    Attends Banker Meetings: More than 4 times per year    Marital Status: Divorced  Depression (PHQ2-9): Low Risk (02/14/2024)   Depression (PHQ2-9)    PHQ-2 Score: 0  Alcohol  Screen: Low Risk (01/13/2024)   Alcohol  Screen    Last Alcohol  Screening Score (AUDIT): 0   Housing: Low Risk (07/23/2024)   Epic    Unable to Pay for Housing in the Last Year: No    Number of Times Moved in the Last Year: 0    Homeless in the Last Year: No  Utilities: Not At Risk (07/23/2024)   Epic    Threatened with loss of utilities: No  Health Literacy: Adequate Health Literacy (01/13/2024)   B1300 Health Literacy    Frequency of need for help with medical instructions: Never    FAMILY HISTORY:  We obtained a detailed, 4-generation family history pasted below.   [name] Page is unaware of relatives completing genetic testing for hereditary cancer risks.  There {IS NO:12509} reported Ashkenazi Jewish ancestry.  There is no known consanguinity.  Pedigree Summary*** Mother - Bowl duct cancer dx.45 Daughter - Skin Cancer Sister - Skin - Squamous Cell Carcinoma 1st Cousin - Colon Cancer dx. 65 1st Cousin - Lung Cancer TOB+ Brother with CP Maternal Grandfather - Lung Cancer  Paternal Aunt - Lung Cancer TOB+ Paternal 1st  Cousin - Melanoma   Significant diagnoses are listed below: Family History  Problem Relation Age of Onset   Stomach cancer Mother 45       Bile Duct Cancer Mets to liver   Atrial fibrillation Father    Hyperlipidemia Father    Hypertension Father    Colon polyps Father    Arthritis Father    COPD Father    Vision loss Father    Hypertension Sister    Hyperlipidemia Sister    Thyroid  disease Sister    Esophageal cancer Neg Hx    Pancreatic cancer Neg Hx    GENETIC COUNSELING ASSESSMENT: [name] Page is a 72 y.o. female with a {Personal/family:20331} history of {cancer/polyps} which is somewhat suggestive of a hereditary cancer predisposition syndrome*** given ***. We, therefore, discussed and recommended the following at today's visit.   DISCUSSION: We discussed that, in general, most cancer is not inherited in families, but instead is sporadic or familial. Sporadic cancers occur by chance and typically happen at older ages (>50 years) as this  type of cancer is caused by genetic changes acquired during an individuals lifetime. Some families have more cancers than would be expected by chance; however, the ages or types of cancer are not consistent with a known genetic mutation or known genetic mutations have been ruled out. This type of familial cancer is thought to be due to a combination of multiple genetic, environmental, hormonal, and lifestyle factors. While this combination of factors likely increases the risk of cancer, the exact source of this risk is not currently identifiable or testable.  We discussed that 5-10% of cancer is the result of germline (heritable) genetic variants, with most cases associated with ***BRCA1/BRCA2. There are other genes that can be associated with hereditary *** cancer syndromes. These include ***. We discussed that testing is beneficial for several reasons including knowing how to follow individuals after completing their treatment, identifying whether potential treatment options such as PARP inhibitors would be beneficial, and understanding if other family members could be at risk for cancer and allow them to undergo genetic testing.   We reviewed the characteristics, features and inheritance patterns of hereditary cancer syndromes. We also discussed genetic testing, including the appropriate family members to test, the process of testing, insurance coverage and turn-around-time for results. We discussed the implications of a negative, positive, carrier and/or variant of uncertain significant result. [name] Page  was offered a common hereditary cancer panel (***40 ***48 genes) and an expanded pan-cancer panel (***70***77 genes). [name] Page was informed of the benefits and limitations of each panel, including that expanded pan-cancer panels contain genes that do not have clear management guidelines at this point in time.  We also discussed that as the number of genes included on a panel increases, the chances  of variants of uncertain significance increases.  ***GENETIC TESTING NATIONAL CRITERIA: Based on [name] Page's {Personal/family:20331} history of cancer she meets medical criteria for genetic testing based on the [employer] (NCCN) guidelines. ***Though [name] Page is not personally affected, there are no affected family members that are willing/able/available to undergo hereditary cancer testing. Therefore, [name] Page the most informative family member available. ***Despite that she meets criteria, she may still have an out of pocket cost.  ***Despite that she meets criteria, she  may still have an out of pocket cost. she completed the [facility] patient assistance form in the office and qualified for $0 out of post cost. We discussed that she will get a text  from [employer] with billing formation and her out-of-pocket cost estimate.  ***We discussed that if her out of pocket cost for testing is over $100, the laboratory will call and confirm whether she wants to proceed with testing.  If the out of pocket cost of testing is less than $100 she will be billed by the genetic testing laboratory.   GENETIC TESTING CONSENT:  After considering the risks, benefits, and limitations, [name] Page did NOT provide consent to pursue genetic testing. A blood sample was sent to [facility] for analysis of the ***CancerNext-Expanded+RNA Panel. Results should be available within approximately ***2-3 weeks' time, at which point they will be disclosed by telephone to [name] Page , as will any additional recommendations warranted by these results. [name] Page will receive a summary of her genetic counseling visit and a copy of her results once available. This information will also be available in Epic.  ***{INSERT}.jrcambry ***{INSERT} .jrcinvit  GENETIC INFORMATION NONDISCRIMINATION ACT (GINA): We discussed that some people do not want to undergo genetic testing due to fear of  genetic discrimination.  The Genetic Information Nondiscrimination Act (GINA) was signed into federal law in 2008. GINA prohibits health insurers and most employers from discriminating against individuals based on genetic information (including the results of genetic tests and family history information). According to GINA, health insurance companies cannot consider genetic information to be a preexisting condition, nor can they use it to make decisions regarding coverage or rates. GINA also makes it illegal for most employers to use genetic information in making decisions about hiring, firing, promotion, or terms of employment. It is important to note that GINA does not offer protections for life insurance, disability insurance, or long-term care insurance. GINA does not apply to those in the [employer], those who work for companies with less than 15 employees, and new life insurance or long-term disability insurance policies.  Health status due to a cancer diagnosis is not protected under GINA. More information about GINA can be found by visiting eliteclients.be. Lastly, we encouraged [name] Page to remain in contact with cancer genetics annually so that we can continuously update the family history and inform her of any changes in cancer genetics and testing that may be of benefit for this family.   [name] Page's questions were answered to her satisfaction today. Our contact information was provided should additional questions or concerns arise. Thank you for the referral and allowing us  to share in the care of your patient.   Resources:  Samirah Scarpati was provided with the following:  ***[facility] Billing information  ***[facility] Hereditary Cancer Testing Patient Guide ***[facility] Patient Assistance Information Sheet ***[facility] CancerNext-Expanded + RNAinsight gene list  PLAN:  ***Testing Ordered: ***Clinic Note Faxed/Routed to [name] Spade PCP ***Jolinda [name] [name], DO   ***[facility] Patient Assistance completed during visit (OOP ***$0) ***Declined Testing *** Despite our recommendation, [name] Page did not wish to pursue genetic testing at today's visit. We understand this decision and remain available to coordinate genetic testing at any time in the future. We, therefore, recommend [name] Page Lana continue to follow the cancer screening guidelines given by her primary healthcare provider.  ***MOST INFORMATIVE PERSON TO TEST ***Based on Melinna Damon's family history, we recommended her ***, who was diagnosed with *** at age ***, have genetic counseling and testing. Zhoey Blackstock will let us  know if we can be of any assistance in coordinating genetic counseling and/or testing for this family member.   Mikel Pyon Lindsey-Mills, MS, CGC  Certified  Genetic Counselor  Email: [email or web address]  Phone: [phone]  I personally spent a total of *** minutes in the care of the patient today including {Time Based Coding:210964241}.  *** The patient was seen alone.  ***The patient was joined by ***. Drs. Lanny Stalls, and/or Gudena were available for questions, if needed. _______________________________________________________________________ For Office Staff:  Number of people involved in session: *** Was an Intern/ student involved with case: {YES/NO:63}

## 2024-09-10 DIAGNOSIS — Z800 Family history of malignant neoplasm of digestive organs: Secondary | ICD-10-CM | POA: Insufficient documentation

## 2024-09-10 DIAGNOSIS — C44521 Squamous cell carcinoma of skin of breast: Secondary | ICD-10-CM | POA: Insufficient documentation

## 2024-09-10 DIAGNOSIS — Z801 Family history of malignant neoplasm of trachea, bronchus and lung: Secondary | ICD-10-CM | POA: Insufficient documentation

## 2024-09-10 DIAGNOSIS — C439 Malignant melanoma of skin, unspecified: Secondary | ICD-10-CM | POA: Insufficient documentation

## 2024-09-10 NOTE — Progress Notes (Signed)
 "  PROVIDER:  JINA CLAIR NEPHEW, MD Patient Care Team: Jolinda [name] [name], DO as PCP - General Nephew Jina Clair, MD as Consulting Provider (Surgical Oncology) Vonzell Rosina Moats, MD as Referring Physician (Radiology) Iruku, Linnette Lane Dice, MD as Referring Physician (Hematology and Oncology) Izell Lauraine Norris, MD (Radiation Oncology)  MRN: [id number] DOB: 02/15/1951 DATE OF ENCOUNTER: 09/10/2024 Initial History:   [name] Page is a 73 year old female who presented 06/2024 with a left axillary mass found on mammogram. She is accompanied by her daughter, Orren.   A left axillary mass was discovered during a routine mammogram. There was no associated breast pain.   The patient's diagnostic mammogram and ultrasound showed a 4.2-4.5 centimeter mass in the far axillary tail of the left breast.  No other findings were seen.  Core needle biopsy was performed which demonstrated metastatic carcinoma with no tissue identified.  Due to the unusual morphology, significant numbers of stains were performed.  Of note it was ER/PR and HER2 negative.  The comment noted that this did not support a breast primary but appeared to be more of a carcinoma with squamous differentiation.   She has a history of a benign meningioma diagnosed in 2021, which required extensive surgery due to its size. Additionally, she had a melanoma, which was a mole on her back removed approximately three to four years ago. The melanoma was excised completely, and no lymph node biopsy was performed at that time.   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 These were addressed after her brain surgery, as she was debilitated at the time and unable to notice their severity.   Her family history includes her mother having bile duct cancer at the age of 98. There are no other known cancers in her immediate family.   No heart  problems are reported, and she follows a diet that includes seafood and chicken, with occasional burgers.    She has a history of squamous cell carcinoma on the chest wall as well as, previously affecting her extremities.  A PET scan was performed and did not reveal any additional concerning findings.   No history of heart attacks or lung problems. She is not on any blood thinners.  Interval History:   Patient underwent excision of malignant left axillary mass August 23, 2024.  Pathology was indeed consistent with metastatic squamous cell carcinoma as we predicted.  This was negative for extranodal extension.  No other suspicious masses were found in the axilla.  History of Present Illness She is in the postoperative period following excision of large left axillary mass for metastatic squamous cell carcinoma. She has managed postoperative discomfort with acetaminophen  and ibuprofen  for several days and does not report significant pain currently.  She continues to wear a breast binder, which she finds comfortable. She notes mild irritation at the underarm area from the binder and is placing Aquaphor.  She has avoided deodorant use under the operative arm.   She has a right shoulder prosthesis placed in 1994 and experiences chronic limitations in shoulder mobility, which restrict her activities.  Her orthopedic physician has suggested possible revision of the prosthesis.  She is gradually increasing her activity as tolerated, with shoulder mobility being the primary limitation.   Pathology 08/23/24 A. SOFT TISSUE MASS, LEFT AXILLARY, EXCISION:  Metastatic moderately differentiated squamous cell carcinoma, 5.4 cm  involving a lymph node.  Negative for extranodal  extension.  Biopsy site and biopsy clip.   Physical Examination:   Left axillary incision healing well.  No evidence of seroma.  No erythema. Severely limited left shoulder range of motion which is chronic, but slightly  worse.   Assessment and Plan:       Diagnoses and all orders for this visit:  Squamous cell carcinoma of lymph node (CMS/HHS-HCC) -     Ambulatory Referral to Physical Therapy  H/O squamous cell carcinoma of skin  H/O melanoma excision    Patient has seen oncology back.  Radiation is planned as well as potential adjuvant immunotherapy.  They are sending some additional pathologic testing to determine which immunotherapy would be useful.  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 Diagnosis confirmed as skin cancer. Adjuvant radiation and immunotherapy planned. Primary limitation is shoulder mobility restriction. Prognosis is continued recovery and improved mobility with rehabilitation. - Referred to physical therapy for postoperative shoulder rehabilitation at her preferred outpatient facility. - Provided education on monitoring for seroma or infection and instructed to seek evaluation if these occur prior to next follow-up. - Advised gradual resumption of normal activities as tolerated, considering pre-existing shoulder limitations. - Instructed to discontinue breast binder and transition to regular bra as tolerated, avoiding incision site irritation. - Provided wound care instructions, including use of deodorant away from the incision and monitoring for irritation. - Scheduled routine follow-up in six months unless complications arise.     Return in about 6 months (around 03/11/2025).   The plan was discussed in detail with the patient today, who expressed understanding.  The patient has my contact information, and understands to call me with any additional questions or concerns in the interval.  I would be happy to see the patient back sooner if the need arises.   JINA CLAIR NEPHEW, MD  "

## 2024-09-18 ENCOUNTER — Other Ambulatory Visit: Payer: Self-pay

## 2024-09-18 ENCOUNTER — Ambulatory Visit: Admitting: Physical Therapy

## 2024-09-18 ENCOUNTER — Encounter: Payer: Self-pay | Admitting: Physical Therapy

## 2024-09-18 DIAGNOSIS — M6281 Muscle weakness (generalized): Secondary | ICD-10-CM | POA: Diagnosis present

## 2024-09-18 DIAGNOSIS — M25612 Stiffness of left shoulder, not elsewhere classified: Secondary | ICD-10-CM | POA: Insufficient documentation

## 2024-09-18 NOTE — Therapy (Signed)
 " OUTPATIENT PHYSICAL THERAPY SHOULDER EVALUATION   Patient Name: [name] Page MRN: [id number] DOB:01-25-1951, 73 y.o., female Today's Date: 09/18/2024  END OF SESSION:  PT End of Session - 09/18/24 1516     Visit Number 1    Number of Visits 12    Date for Recertification  12/17/24    PT Start Time 0225    PT Stop Time 0305    PT Time Calculation (min) 40 min    Activity Tolerance Patient tolerated treatment well    Behavior During Therapy [facility] for tasks assessed/performed          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   sees Dr Ivin   Dysuria    Frequency of urination    Hematuria    History of kidney stones    Hypertension    Melanoma (HCC)    Meningioma (HCC) 09/04/2020   Osteoporosis    Renal calculus, bilateral    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   Urgency of urination    Wears glasses    Past Surgical History:  Procedure Laterality Date   APPLICATION OF CRANIAL NAVIGATION Right 09/11/2020   Procedure: APPLICATION OF CRANIAL NAVIGATION;  Surgeon: Debby Dorn MATSU, MD;  Location: [facility] OR;  Service: Neurosurgery;  Laterality: Right;   CRANIOTOMY Right 09/11/2020   Procedure: Right Pterional Craniotomy for resection of anterior skull base mass with Brain Lab;  Surgeon: Debby Dorn MATSU, MD;  Location: [facility] OR;  Service: Neurosurgery;  Laterality: Right;   CYSTOSCOPY WITH RETROGRADE PYELOGRAM, URETEROSCOPY AND STENT PLACEMENT Bilateral 08/11/2013   Procedure: CYSTOSCOPY WITH RETROGRADE PYELOGRAM, URETEROSCOPY AND STENT PLACEMENT;  Surgeon: Ricardo Likens, MD;  Location: [facility] ORS;  Service: Urology;  Laterality: Bilateral;   CYSTOSCOPY WITH URETEROSCOPY AND STENT PLACEMENT Bilateral 08/29/2013   Procedure: CYSTOSCOPY WITH BILATERAL URETEROSCOPY AND STENT REPLACEMENTS, stone extraction;  Surgeon: Ricardo Likens, MD;  Location: [facility];   Service: Urology;  Laterality: Bilateral;   CYSTOSCOPY/RETROGRADE/URETEROSCOPY/STONE EXTRACTION WITH BASKET Bilateral 01/31/2016   Procedure: CYSTOSCOPY/BILATERAL RETROGRADE PYELOGRAMS/BILATERAL URETEROSCOPY/BILATERAL STONE EXTRACTION WITH BASKET/RIGHT URETERAL STENT PLACEMENT;  Surgeon: Gretel Ferrara, MD;  Location: [facility];  Service: Urology;  Laterality: Bilateral;   EXCISION, MASS, UPPER EXTREMITY Left 08/23/2024   Procedure: EXCISION, MASS, UPPER EXTREMITY;  Surgeon: Aron Shoulders, MD;  Location: [facility] OR;  Service: General;  Laterality: Left;  EXCISION OF LEFT AXILLARY MASS   HEMIARTHROPLASTY SHOULDER FRACTURE Left 09/20/1992   HOLMIUM LASER APPLICATION Bilateral 08/29/2013   Procedure: HOLMIUM LASER APPLICATION;  Surgeon: Ricardo Likens, MD;  Location: [facility];  Service: Urology;  Laterality: Bilateral;   Patient Active Problem List   Diagnosis Date Noted   Family history of cholangiocarcinoma 09/10/2024   Squamous cell carcinoma of breast, left 09/10/2024   Family history of colon cancer 09/10/2024   Family history of lung cancer 09/10/2024   Family history of liver cancer 09/10/2024   Melanoma of skin (HCC) 09/10/2024   Arterial calcification 06/12/2024   History of resection of meningioma 11/02/2020   Peripheral edema    Hypoalbuminemia due to protein-calorie malnutrition    Transaminitis    Tremor    Iron deficiency anemia    CKD (chronic kidney disease), stage III (HCC) 09/10/2020   Vasogenic Cerebral edema (HCC) 09/04/2020   Nephrolithiasis 09/04/2020   Essential hypertension 11/01/2015  Osteoporosis 10/30/2015    REFERRING PROVIDER: Jina Rayas MD  REFERRING DIAG: Left shoulder arthritis s/p shoulder replacement.   THERAPY DIAG:  Stiffness of left shoulder, not elsewhere classified  Muscle weakness (generalized)  Rationale for Evaluation and Treatment: Rehabilitation  ONSET DATE: September, 2025.  SUBJECTIVE:                                                                                                                                                                                       SUBJECTIVE STATEMENT: The patient presents to the clinic with a c/o loss of function of her left shoulder.  She has a h/o a hemi shoulder in 1994 and a recent excision in her left axillary region relate to cancer.   PERTINENT HISTORY: Please see above.    PAIN:  Are you having pain? No  PRECAUTIONS: Other: H/o cancer.  RED FLAGS: None   WEIGHT BEARING RESTRICTIONS: No  FALLS:  Has patient fallen in last 6 months? No  LIVING ENVIRONMENT: Lives in: House/apartment Has following equipment at home: None  PLOF: Independent  PATIENT GOALS:  Get more use from left shoulder.  NEXT MD VISIT:   OBJECTIVE:   PATIENT SURVEYS:  QUICKDASH:  20 points (20.46).  POSTURE: Forward head and rounded shoulders.  UPPER EXTREMITY ROM:   Patient has no active left shoulder while seated against gravity.  In supine she has 30 degrees of flexion and passively to 90 degrees.  ER is -10 degrees from neutral.    UPPER EXTREMITY MMT:  Left shoulder deltoid strength is 2-/5, ER is 1+ to 2-/5.  IR is 4-/5.    OBSERVATION:                                                                                                                            TREATMENT DATE:   Left axillary incision appear to be healing well.    PATIENT EDUCATION: Education details: See below. Person educated: Patient Education method: Explanation, Demonstration, Tactile cues, Verbal cues, and Handouts Education comprehension: verbalized understanding, returned demonstration, and verbal cues required  HOME EXERCISE PROGRAM: CANE CANE  [  [id number]]  Seated Wand UE External Rotation -  Repeat 10 Repetitions, Hold 10 Seconds, Complete 2 Sets, Perform 3 Times a Day  Closed chain flexion stretch -  Repeat 10 Repetitions, Hold 10 Seconds, Complete 2 Sets, Perform 4 Times a  Day  WAND EXTERNAL ROTATION - SUPINE ER -  Repeat 10 Repetitions, Hold 10 Seconds, Complete 2 Sets, Perform 4 Times a Day  ASSESSMENT:  CLINICAL IMPRESSION: The patient presents to OPPT with a loss of left shoulder function.  Her left shoulder is globally weak and she has a significant loss of active and passive range of motion.  Her QUICKDASH score is 20.46. Patient will benefit from skilled physical therapy intervention to address deficits.    OBJECTIVE IMPAIRMENTS: decreased activity tolerance, decreased ROM, and decreased strength.   ACTIVITY LIMITATIONS: carrying, lifting, and reach over head  PARTICIPATION LIMITATIONS: meal prep, cleaning, and laundry  PERSONAL FACTORS: Time since onset of injury/illness/exacerbation and 1-2 comorbidities: excision if left axillary region (lymph node) left hemi shoulder replacement are also affecting patient's functional outcome.   REHAB POTENTIAL: Fair /Fair+  CLINICAL DECISION MAKING: Stable/uncomplicated  EVALUATION COMPLEXITY: Low   GOALS:  SHORT TERM GOALS: Target date: 10/02/24  Ind with a HEP. Goal status: INITIAL   LONG TERM GOALS: Target date: 12/17/24  Ind with an advanced HEP.  Goal status: INITIAL  2.  Active left shoulder flexion to 125 degrees so the patient can easily reach overhead.  Goal status: INITIAL  3.  Active ER to 50 degrees+ to allow for easily donning/doffing of apparel.  Goal status: INITIAL  4.  Increase left shoulder strength to a solid 3+/5 to increase stability for performance of functional activities.  Goal status: INITIAL  5.  Improve QUICKDASH score by  Baseline:  Goal status: INITIAL  PLAN:  PT FREQUENCY: 2x/week  PT DURATION: 6 weeks  PLANNED INTERVENTIONS: 97110-Therapeutic exercises, 97530- Therapeutic activity, [id number]- Neuromuscular re-education, 97535- Self Care, [id number]- Manual therapy, and Patient/Family education  PLAN FOR NEXT SESSION: Pulleys, UE Ranger (begin seate), P and AAROM,  isometrics, wall ladder.     Makai Agostinelli, PT 09/18/2024, 3:44 PM  "

## 2024-09-18 NOTE — Progress Notes (Incomplete)
 Location of Breast Cancer: C44.521 (ICD-10-CM) - Squamous cell carcinoma of breast, left  Histology per Pathology Report:   Receptor Status: ER(), PR (), Her2-neu (), Ki-67()  Did patient present with symptoms (if so, please note symptoms) or was this found on screening mammography?: Screening mammography  Past/Anticipated interventions by surgeon, if any:  Past/Anticipated interventions by medical oncology, if any:    Lymphedema issues, if any:   Pain issues, if any:  No   SAFETY ISSUES: Prior radiation? No Pacemaker/ICD? No Possible current pregnancy?No Is the patient on methotrexate? No  Current Complaints / other details:      Wt Readings from Last 4 Encounters: 09/22/23          142 lb  09/06/24 146 lb (66.2 kg)  08/23/24 146 lb (66.2 kg)  08/20/24 145 lb 14.4 oz (66.2 kg)   BP 139/82 (BP Location: Right Arm, Patient Position: Sitting, Cuff Size: Normal)   Pulse 70   Temp 97.7 F (36.5 C) (Oral)   Resp 16   Wt 142 lb 12.8 oz (64.8 kg)   SpO2 98%   BMI 26.98 kg/m

## 2024-09-19 DIAGNOSIS — C773 Secondary and unspecified malignant neoplasm of axilla and upper limb lymph nodes: Secondary | ICD-10-CM | POA: Insufficient documentation

## 2024-09-19 NOTE — Progress Notes (Signed)
 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 female seen in consultation for radiation therapy.  She initially presented in October 2025 after a L axillary mass was identified on routine screening mammography. She was asymptomatic at the time, with no associated breast pain.  Diagnostic mammography and US  demonstrated a 4.2-4.5 cm mass in the far axillary tail of the L breast without additional breast or axillary findings. Core needle biopsy on 07/02/24 revealed metastatic carcinoma without identifiable lymphoid tissue. IHC staining demonstrated ER/PR- and Her2- disease, with morphology not supportive of a breast primary and more consistent with carcinoma showing squamous differentiation.  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  PET on 07/30/24 demonstrated a hypermetabolic biloed mass in the L lateral breast/axillary region measuring 5.2 x 3.4 cm with a max SUV of 15.8, consistent with malignancy, without evidence of additional nodal, visceral or osseous metastatic disease.  She subsequently underwent L axillary mass excision on 08/23/24. Final path demonstrated metastatic moderately differentiated SqCC measuring 5.4 cm involving a LN without ENE. Biopsy site changes and clip were identified.   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   sees Dr Ivin   Dysuria    Frequency of urination    Hematuria    History of kidney stones    Hypertension    Melanoma (HCC)    Meningioma (HCC) 09/04/2020   Osteoporosis    Renal calculus, bilateral    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   Urgency of urination    Wears glasses        PAST SURGICAL HISTORY: Past Surgical History:  Procedure Laterality Date   APPLICATION OF CRANIAL NAVIGATION Right 09/11/2020   Procedure: APPLICATION OF CRANIAL NAVIGATION;  Surgeon: Debby Dorn MATSU, MD;  Location: [facility] OR;  Service: Neurosurgery;  Laterality: Right;   CRANIOTOMY Right 09/11/2020   Procedure: Right Pterional Craniotomy for resection of anterior skull base mass with Brain Lab;  Surgeon: Debby Dorn MATSU, MD;  Location: [facility] OR;  Service: Neurosurgery;  Laterality: Right;   CYSTOSCOPY WITH RETROGRADE PYELOGRAM, URETEROSCOPY AND STENT PLACEMENT Bilateral 08/11/2013   Procedure: CYSTOSCOPY WITH RETROGRADE PYELOGRAM, URETEROSCOPY AND STENT PLACEMENT;  Surgeon: Ricardo Likens, MD;  Location: [facility];  Service: Urology;  Laterality: Bilateral;   CYSTOSCOPY WITH URETEROSCOPY AND STENT PLACEMENT Bilateral 08/29/2013   Procedure: CYSTOSCOPY WITH BILATERAL URETEROSCOPY AND STENT REPLACEMENTS, stone extraction;  Surgeon: Ricardo Likens, MD;  Location: [facility];  Service: Urology;  Laterality: Bilateral;   CYSTOSCOPY/RETROGRADE/URETEROSCOPY/STONE EXTRACTION WITH BASKET Bilateral 01/31/2016  Procedure: CYSTOSCOPY/BILATERAL RETROGRADE PYELOGRAMS/BILATERAL URETEROSCOPY/BILATERAL STONE EXTRACTION WITH BASKET/RIGHT URETERAL STENT PLACEMENT;  Surgeon:  Gretel Ferrara, MD;  Location: [facility];  Service: Urology;  Laterality: Bilateral;   EXCISION, MASS, UPPER EXTREMITY Left 08/23/2024   Procedure: EXCISION, MASS, UPPER EXTREMITY;  Surgeon: Aron Shoulders, MD;  Location: [facility] OR;  Service: General;  Laterality: Left;  EXCISION OF LEFT AXILLARY MASS   HEMIARTHROPLASTY SHOULDER FRACTURE Left 09/20/1992   HOLMIUM LASER APPLICATION Bilateral 08/29/2013   Procedure: HOLMIUM LASER APPLICATION;  Surgeon: Ricardo Likens, MD;  Location: [facility];  Service: Urology;  Laterality: Bilateral;     FAMILY HISTORY:  Family History  Problem Relation Age of Onset   Stomach cancer Mother 33       Bile Duct Cancer Mets to liver   Atrial fibrillation Father    Hyperlipidemia Father    Hypertension Father    Colon polyps Father    Arthritis Father    COPD Father    Vision loss Father    Hypertension Sister    Hyperlipidemia Sister    Thyroid  disease Sister    Esophageal cancer Neg Hx    Pancreatic cancer Neg Hx      SOCIAL HISTORY:  reports that she has never smoked. She has never used smokeless tobacco. She reports that she does not drink alcohol  and does not use drugs.   ALLERGIES: Sulfa antibiotics   MEDICATIONS:  Current Outpatient Medications  Medication Sig Dispense Refill   acetaminophen  (TYLENOL ) 500 MG tablet Take 500 mg by mouth every 6 (six) hours as needed for moderate pain (pain score 4-6).     albuterol  (VENTOLIN  HFA) 108 (90 Base) MCG/ACT inhaler Inhale 2 puffs into the lungs every 6 (six) hours as needed for wheezing or shortness of breath. 8 g 0   alendronate  (FOSAMAX ) 70 MG tablet Take 1 tablet (70 mg total) by mouth every 7 (seven) days. 12 tablet 4   azelastine  (ASTELIN ) 0.1 % nasal spray Place 1 spray into both nostrils 2 (two) times daily. (Patient taking differently: Place 1 spray into both nostrils 2 (two) times daily as needed for allergies.) 30 mL 12   Calcium  Carbonate-Vit D-Min (CALCIUM  1200 PO) Take 1  tablet by mouth daily.     cyclobenzaprine  (FLEXERIL ) 5 MG tablet Take 1 tablet (5 mg total) by mouth 3 (three) times daily as needed for muscle spasms. 30 tablet 0   fluorouracil (EFUDEX) 5 % cream Apply topically 2 (two) times a week.     Glycerin -Hypromellose-PEG 400 0.2-0.2-1 % SOLN Apply 1 drop to eye 2 (two) times daily as needed (dry eyes).      ibuprofen  (ADVIL ) 600 MG tablet Take 1 tablet (600 mg total) by mouth every 8 (eight) hours as needed for moderate pain (pain score 4-6). 90 tablet 0   indapamide (LOZOL) 1.25 MG tablet Take 1.25 mg by mouth daily. (Patient not taking: Reported on 05/28/2024)     Multiple Vitamin (MULTIVITAMIN WITH MINERALS) TABS tablet Take 1 tablet by mouth daily.     oxyCODONE  (OXY IR/ROXICODONE ) 5 MG immediate release tablet Take 1 tablet (5 mg total) by mouth every 6 (six) hours as needed for severe pain (pain score 7-10). 10 tablet 0   rosuvastatin  (CRESTOR ) 10 MG tablet Take 1 tablet (10 mg total) by mouth daily. 90 tablet 3   No current facility-administered medications for this visit.     REVIEW OF SYSTEMS: The patient reports that they are doing well generally. Pertinent ROS per HPI  and otherwise negative.      PHYSICAL EXAM:  Wt Readings from Last 3 Encounters:  09/06/24 146 lb (66.2 kg)  08/23/24 146 lb (66.2 kg)  08/20/24 145 lb 14.4 oz (66.2 kg)   Temp Readings from Last 3 Encounters:  09/06/24 (!) 97.2 F (36.2 C) (Temporal)  08/23/24 97.7 F (36.5 C)  08/20/24 98.3 F (36.8 C)   BP Readings from Last 3 Encounters:  09/06/24 (!) 174/84  08/23/24 131/62  08/20/24 (!) 156/72   Pulse Readings from Last 3 Encounters:  09/06/24 65  08/23/24 62  08/20/24 72    /10   Physical Exam   Breast Exam:  Well-healed lumpectomy/re-excision scar in the *** quadrant. No erythema, induration, or drainage. No palpable masses or nodularity at the surgical site. No skin changes, nipple inversion, or discharge.   ECOG = ***   LABORATORY  DATA:  Lab Results  Component Value Date   WBC 5.4 08/20/2024   HGB 13.1 08/20/2024   HCT 41.9 08/20/2024   MCV 87.7 08/20/2024   PLT 189 08/20/2024   Lab Results  Component Value Date   NA 145 08/20/2024   K 4.1 08/20/2024   CL 108 08/20/2024   CO2 24 08/20/2024   Lab Results  Component Value Date   ALT 13 12/12/2023   AST 25 12/12/2023   ALKPHOS 127 (H) 12/12/2023   BILITOT 0.5 12/12/2023      RADIOGRAPHY:   PET AND CT SKULL BASE TO MID THIGH 07/30/2024    COMPARISON: CT scan 09/05/2020.   CLINICAL HISTORY: squamous cell carcinoma left axilla, unknown primary   FINDINGS:   HEAD AND NECK: Palatine tonsillar activity with maximum SUV 5.6 on the right and 6.1 on the left, likely physiologic. Chronic gliosis/encephalomalacia in the inferior right frontal lobe as shown on prior brain imaging. Prior right temporal craniotomy. Grade 1 anterolisthesis at C4-C5. No metabolically active cervical lymphadenopathy.   CHEST: Left lateral breast/axillary bilobed mass measuring 5.2 x 3.4 cm on image 62 series 4 with maximum SUV 15.8, compatible with malignancy. Mild cardiomegaly. Aortic atherosclerosis. No metabolically active pulmonary nodules. No metabolically active lymphadenopathy.   ABDOMEN AND PELVIS: Photopenic simple cyst exophytic from the left kidney lower pole warranting no further imaging workup. Cholelithiasis. No metabolically active intraperitoneal mass. No metabolically active lymphadenopathy. Physiologic activity within the gastrointestinal and genitourinary systems.   BONES AND SOFT TISSUE: Left proximal humeral prosthesis. Deformity in the right proximal humerus compatible with old healed fracture. No abnormal FDG activity localizes to the bones. No metabolically active aggressive osseous lesion.   IMPRESSION: 1. Left lateral breast/axillary bilobed mass measuring 5.2 x 3.4 cm with maximum SUV 15.8, compatible with malignancy. 2. Mild  cardiomegaly. 3. Aortic atherosclerosis. 4. Cholelithiasis. 5. Grade 1 anterolisthesis at C4-5.   PATHOLOGY:  L Axillary Mass Excision 08/23/24:  FINAL MICROSCOPIC DIAGNOSIS:   A. SOFT TISSUE MASS, LEFT AXILLARY, EXCISION:  Metastatic moderately differentiated squamous cell carcinoma, 5.4 cm  involving a lymph node.  Negative for extranodal extension.  Biopsy site and biopsy clip.       L Axillary LN Biopsy 07/02/24:  FINAL DIAGNOSIS        1. Lymph node, biopsy, mass, left low axilla/axillary tail :       METASTATIC CARCINOMA.       NO LYMPHOID TISSUE IDENTIFIED.       SEE NOTE.        Diagnosis Note : Immunohistochemical stains will be performed to investigate the site of  primary. Dr. Delaine reviewed the case and concurs with the       interpretation.  A breast prognostic profile (ER and PR) is pending and will be       reported in an addendum. [facility] was notified on 07/03/2024.    IMPRESSION/PLAN:   Ms. 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 No evidence of metastatic disease on 08/01/24 PET.  She is being seen post-operatively for consideration of adjuvant radiation therapy in setting of her very high-risk disease. We discussed the role of irradiation in reducing the risk of loco-regional recurrence and improving long-term disease control.  We reviewed the logistics of treatment in detail, including the need for a CT simulation for treatment planning, followed by several days for contouring, dosimetry, and quality assurance prior to starting therapy.   The planned course of radiation therapy will consist of daily treatments, Monday through Friday, over approximately 6 weeks.   We reviewed that each treatment session typically lasts only a few minutes, though positioning and setup may take additional time. The patient  should plan to be in the department for approximately 45 minutes per visit. She will be evaluated by me at least once weekly during treatment to monitor for side effects, assess tolerance, and ensure it is safe to continue therapy.  We discussed acute and subacute side effects which are generally gradual in onset and typically include fatigue, skin erythema, tanning or hyperpigmentation, edema and mild tenderness.  Less common but possible side effects include desquamation of the skin and decreased range of motion of the shoulder.  Reassured patient that most acute side effects improve over weeks to months after completing therapy.   Patient verbalized understanding of these risks and benefits and she is in agreement with the plan.   --  Total time spent today in preparation for this visit was *** minutes. This included patient care, imaging and path review, documentation, multidisciplinary discussion and coordination of care and follow up.    Estefana [name]. Maritza, M.D.   "

## 2024-09-21 ENCOUNTER — Encounter: Payer: Self-pay | Admitting: Radiation Oncology

## 2024-09-21 ENCOUNTER — Ambulatory Visit
Admission: RE | Admit: 2024-09-21 | Discharge: 2024-09-21 | Disposition: A | Discharge status: Home / Self Care | Source: Ambulatory Visit | Attending: Radiation Oncology | Admitting: Radiation Oncology

## 2024-09-21 VITALS — BP 139/82 | HR 70 | Temp 97.7°F | Resp 16 | Wt 142.8 lb

## 2024-09-21 DIAGNOSIS — Z85820 Personal history of malignant melanoma of skin: Secondary | ICD-10-CM | POA: Insufficient documentation

## 2024-09-21 DIAGNOSIS — Z7983 Long term (current) use of bisphosphonates: Secondary | ICD-10-CM | POA: Insufficient documentation

## 2024-09-21 DIAGNOSIS — M4312 Spondylolisthesis, cervical region: Secondary | ICD-10-CM | POA: Insufficient documentation

## 2024-09-21 DIAGNOSIS — I119 Hypertensive heart disease without heart failure: Secondary | ICD-10-CM | POA: Diagnosis not present

## 2024-09-21 DIAGNOSIS — C50612 Malignant neoplasm of axillary tail of left female breast: Secondary | ICD-10-CM | POA: Diagnosis not present

## 2024-09-21 DIAGNOSIS — M129 Arthropathy, unspecified: Secondary | ICD-10-CM | POA: Diagnosis not present

## 2024-09-21 DIAGNOSIS — Z1732 Human epidermal growth factor receptor 2 negative status: Secondary | ICD-10-CM | POA: Diagnosis not present

## 2024-09-21 DIAGNOSIS — Z800 Family history of malignant neoplasm of digestive organs: Secondary | ICD-10-CM | POA: Insufficient documentation

## 2024-09-21 DIAGNOSIS — Z1722 Progesterone receptor negative status: Secondary | ICD-10-CM | POA: Insufficient documentation

## 2024-09-21 DIAGNOSIS — I10 Essential (primary) hypertension: Secondary | ICD-10-CM | POA: Diagnosis not present

## 2024-09-21 DIAGNOSIS — Z171 Estrogen receptor negative status [ER-]: Secondary | ICD-10-CM | POA: Diagnosis not present

## 2024-09-21 DIAGNOSIS — C773 Secondary and unspecified malignant neoplasm of axilla and upper limb lymph nodes: Secondary | ICD-10-CM | POA: Insufficient documentation

## 2024-09-21 DIAGNOSIS — M810 Age-related osteoporosis without current pathological fracture: Secondary | ICD-10-CM | POA: Diagnosis not present

## 2024-09-21 DIAGNOSIS — Z79899 Other long term (current) drug therapy: Secondary | ICD-10-CM | POA: Diagnosis not present

## 2024-09-21 DIAGNOSIS — Z87442 Personal history of urinary calculi: Secondary | ICD-10-CM | POA: Diagnosis not present

## 2024-09-21 DIAGNOSIS — I700 Atherosclerosis of aorta: Secondary | ICD-10-CM | POA: Insufficient documentation

## 2024-09-21 DIAGNOSIS — Z85828 Personal history of other malignant neoplasm of skin: Secondary | ICD-10-CM | POA: Insufficient documentation

## 2024-09-21 DIAGNOSIS — K8020 Calculus of gallbladder without cholecystitis without obstruction: Secondary | ICD-10-CM | POA: Diagnosis not present

## 2024-09-24 ENCOUNTER — Ambulatory Visit: Attending: General Surgery | Admitting: *Deleted

## 2024-09-24 ENCOUNTER — Encounter: Payer: Self-pay | Admitting: *Deleted

## 2024-09-24 DIAGNOSIS — M6281 Muscle weakness (generalized): Secondary | ICD-10-CM | POA: Insufficient documentation

## 2024-09-24 DIAGNOSIS — M25612 Stiffness of left shoulder, not elsewhere classified: Secondary | ICD-10-CM | POA: Diagnosis present

## 2024-09-24 NOTE — Therapy (Signed)
 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   sees Dr Ivin   Dysuria    Frequency of urination    Hematuria    History of kidney stones    Hypertension    Melanoma (HCC)    Meningioma (HCC) 09/04/2020   Osteoporosis    Renal calculus, bilateral    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   Urgency of urination    Wears glasses    Past Surgical History:  Procedure Laterality Date   APPLICATION OF CRANIAL NAVIGATION Right 09/11/2020   Procedure: APPLICATION OF CRANIAL NAVIGATION;  Surgeon: Debby Dorn MATSU, MD;  Location: [facility] OR;  Service: Neurosurgery;  Laterality: Right;   CRANIOTOMY Right 09/11/2020   Procedure: Right Pterional Craniotomy for resection of anterior skull base mass with Brain Lab;  Surgeon: Debby Dorn MATSU, MD;  Location: [facility] OR;  Service: Neurosurgery;  Laterality: Right;   CYSTOSCOPY WITH RETROGRADE PYELOGRAM, URETEROSCOPY AND STENT PLACEMENT Bilateral 08/11/2013   Procedure: CYSTOSCOPY WITH RETROGRADE PYELOGRAM, URETEROSCOPY AND STENT PLACEMENT;  Surgeon: Ricardo Likens, MD;  Location: [facility];  Service: Urology;  Laterality: Bilateral;   CYSTOSCOPY WITH URETEROSCOPY AND STENT PLACEMENT Bilateral 08/29/2013   Procedure: CYSTOSCOPY WITH BILATERAL URETEROSCOPY AND STENT REPLACEMENTS, stone extraction;  Surgeon: Ricardo Likens, MD;  Location: [facility];  Service: Urology;  Laterality: Bilateral;   CYSTOSCOPY/RETROGRADE/URETEROSCOPY/STONE EXTRACTION WITH BASKET Bilateral  01/31/2016   Procedure: CYSTOSCOPY/BILATERAL RETROGRADE PYELOGRAMS/BILATERAL URETEROSCOPY/BILATERAL STONE EXTRACTION WITH BASKET/RIGHT URETERAL STENT PLACEMENT;  Surgeon: Gretel Ferrara, MD;  Location: [facility];  Service: Urology;  Laterality: Bilateral;   EXCISION, MASS, UPPER EXTREMITY Left 08/23/2024   Procedure: EXCISION, MASS, UPPER EXTREMITY;  Surgeon: Aron Shoulders, MD;  Location: [facility] OR;  Service: General;  Laterality: Left;  EXCISION OF LEFT AXILLARY MASS   HEMIARTHROPLASTY SHOULDER FRACTURE Left 09/20/1992   HOLMIUM LASER APPLICATION Bilateral 08/29/2013   Procedure: HOLMIUM LASER APPLICATION;  Surgeon: Ricardo Likens, MD;  Location: [facility];  Service: Urology;  Laterality: Bilateral;   Patient Active Problem List   Diagnosis Date Noted   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) 09/19/2024   Family history of cholangiocarcinoma 09/10/2024   Squamous cell carcinoma of breast, left 09/10/2024   Family history of colon cancer 09/10/2024   Family history of lung cancer 09/10/2024   Family history of liver cancer 09/10/2024   Melanoma of skin (HCC) 09/10/2024   Arterial calcification 06/12/2024   History of resection of meningioma 11/02/2020   Peripheral edema    Hypoalbuminemia due to protein-calorie malnutrition    Transaminitis    Tremor    Iron deficiency anemia    CKD (chronic kidney disease), stage III (HCC) 09/10/2020   Vasogenic Cerebral edema (HCC) 09/04/2020   Nephrolithiasis 09/04/2020   Essential hypertension 11/01/2015   Osteoporosis 10/30/2015    REFERRING PROVIDER: Shoulders Rayas  MD  REFERRING DIAG: Left shoulder arthritis s/p shoulder replacement.   THERAPY DIAG:  Muscle weakness (generalized)  Stiffness of left shoulder, not elsewhere classified  Rationale for Evaluation and Treatment: Rehabilitation  ONSET DATE: September, 2025.  SUBJECTIVE:                                                                                                                                                                                       SUBJECTIVE STATEMENT: The patient presents to the clinic with a c/o loss of function of her left shoulder.  She has a h/o a hemi shoulder in 1994 and a recent excision in her left axillary region relate to cancer. Did okay after Eval.  PERTINENT HISTORY: Please see above.    PAIN:  Are you having pain? No  PRECAUTIONS: Other: H/o cancer.  RED FLAGS: None   WEIGHT BEARING RESTRICTIONS: No  FALLS:  Has patient fallen in last 6 months? No  LIVING ENVIRONMENT: Lives in: House/apartment Has following equipment at home: None  PLOF: Independent  PATIENT GOALS:  Get more use from left shoulder.  NEXT MD VISIT:   OBJECTIVE:   PATIENT SURVEYS:  QUICKDASH:  20 points (20.46).  POSTURE: Forward head and rounded shoulders.  UPPER EXTREMITY ROM:   Patient has no active left shoulder while seated against gravity.  In supine she has 30 degrees of flexion and passively to 90 degrees.  ER is -10 degrees from neutral.    UPPER EXTREMITY MMT:  Left shoulder deltoid strength is 2-/5, ER is 1+ to 2-/5.  IR is 4-/5.    OBSERVATION:                                                                                                                            TREATMENT DATE: 09-24-24                                    EXERCISE LOG      LT shldr  Exercise Repetitions and Resistance Comments  Pulleys 5 mins  Ranger seated 5 mins   Red bar 2x10   Wall ladder X5    Supine Punches AAROM 2x10   Supine Circles CW/CC 2x10 each challenging   Blank cell = exercise not performed today  Manual PROM/  AAROM  for elevation, ER, and scaption.   Left axillary incision appear to be healing well.    PATIENT EDUCATION: Education details: See below. Person educated: Patient Education method: Explanation, Demonstration, Tactile cues, Verbal cues, and Handouts Education comprehension: verbalized  understanding, returned demonstration, and verbal cues required  HOME EXERCISE PROGRAM: CANE CANE  [[id number]]  Seated Wand UE External Rotation -  Repeat 10 Repetitions, Hold 10 Seconds, Complete 2 Sets, Perform 3 Times a Day  Closed chain flexion stretch -  Repeat 10 Repetitions, Hold 10 Seconds, Complete 2 Sets, Perform 4 Times a Day  WAND EXTERNAL ROTATION - SUPINE ER -  Repeat 10 Repetitions, Hold 10 Seconds, Complete 2 Sets, Perform 4 Times a Day  ASSESSMENT:  CLINICAL IMPRESSION: The patient arrived today doing fairly well with LT shldr. Rx focused on AAROM exs as well as manual PROM and AAROM movements.    OBJECTIVE IMPAIRMENTS: decreased activity tolerance, decreased ROM, and decreased strength.   ACTIVITY LIMITATIONS: carrying, lifting, and reach over head  PARTICIPATION LIMITATIONS: meal prep, cleaning, and laundry  PERSONAL FACTORS: Time since onset of injury/illness/exacerbation and 1-2 comorbidities: excision if left axillary region (lymph node) left hemi shoulder replacement are also affecting patient's functional outcome.   REHAB POTENTIAL: Fair /Fair+  CLINICAL DECISION MAKING: Stable/uncomplicated  EVALUATION COMPLEXITY: Low   GOALS:  SHORT TERM GOALS: Target date: 10/02/24  Ind with a HEP. Goal status: INITIAL   LONG TERM GOALS: Target date: 12/17/24  Ind with an advanced HEP.  Goal status: INITIAL  2.  Active left shoulder flexion to 125 degrees so the patient can easily reach overhead.  Goal status: INITIAL  3.  Active ER to 50 degrees+ to allow for easily donning/doffing of apparel.  Goal status: INITIAL  4.  Increase left shoulder strength to a solid 3+/5 to increase stability for performance of functional activities.  Goal status: INITIAL  5.  Improve QUICKDASH score by  Baseline:  Goal status: INITIAL  PLAN:  PT FREQUENCY: 2x/week  PT DURATION: 6 weeks  PLANNED INTERVENTIONS: 97110-Therapeutic exercises, 97530- Therapeutic activity,  [id number]- Neuromuscular re-education, 97535- Self Care, [id number]- Manual therapy, and Patient/Family education  PLAN FOR NEXT SESSION: Pulleys, UE Ranger (begin seate), P and AAROM, isometrics, wall ladder.     Dyke Weible,CHRIS, PTA 09/24/2024, 11:58 AM  "

## 2024-09-26 ENCOUNTER — Ambulatory Visit

## 2024-09-26 DIAGNOSIS — M25612 Stiffness of left shoulder, not elsewhere classified: Secondary | ICD-10-CM

## 2024-09-26 DIAGNOSIS — M6281 Muscle weakness (generalized): Secondary | ICD-10-CM

## 2024-09-26 NOTE — Therapy (Signed)
 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   sees Dr Ivin   Dysuria    Frequency of urination    Hematuria    History of kidney stones    Hypertension    Melanoma (HCC)    Meningioma (HCC) 09/04/2020   Osteoporosis    Renal calculus, bilateral    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   Urgency of urination    Wears glasses    Past Surgical History:  Procedure Laterality Date   APPLICATION OF CRANIAL NAVIGATION Right 09/11/2020   Procedure: APPLICATION OF CRANIAL NAVIGATION;  Surgeon: Debby Dorn MATSU, MD;  Location: [facility] OR;  Service: Neurosurgery;  Laterality: Right;   CRANIOTOMY Right 09/11/2020   Procedure: Right Pterional Craniotomy for resection of anterior skull base mass with Brain Lab;  Surgeon: Debby Dorn MATSU, MD;  Location: [facility] OR;  Service: Neurosurgery;  Laterality: Right;   CYSTOSCOPY WITH RETROGRADE PYELOGRAM, URETEROSCOPY AND STENT PLACEMENT Bilateral 08/11/2013   Procedure: CYSTOSCOPY WITH RETROGRADE PYELOGRAM, URETEROSCOPY AND STENT PLACEMENT;  Surgeon: Ricardo Likens, MD;  Location: [facility];  Service: Urology;  Laterality: Bilateral;   CYSTOSCOPY WITH URETEROSCOPY AND STENT PLACEMENT Bilateral 08/29/2013   Procedure: CYSTOSCOPY WITH BILATERAL URETEROSCOPY AND STENT REPLACEMENTS, stone extraction;  Surgeon: Ricardo Likens, MD;  Location: [facility];  Service: Urology;  Laterality: Bilateral;   CYSTOSCOPY/RETROGRADE/URETEROSCOPY/STONE EXTRACTION WITH BASKET Bilateral  01/31/2016   Procedure: CYSTOSCOPY/BILATERAL RETROGRADE PYELOGRAMS/BILATERAL URETEROSCOPY/BILATERAL STONE EXTRACTION WITH BASKET/RIGHT URETERAL STENT PLACEMENT;  Surgeon: Gretel Ferrara, MD;  Location: [facility];  Service: Urology;  Laterality: Bilateral;   EXCISION, MASS, UPPER EXTREMITY Left 08/23/2024   Procedure: EXCISION, MASS, UPPER EXTREMITY;  Surgeon: Aron Shoulders, MD;  Location: [facility] OR;  Service: General;  Laterality: Left;  EXCISION OF LEFT AXILLARY MASS   HEMIARTHROPLASTY SHOULDER FRACTURE Left 09/20/1992   HOLMIUM LASER APPLICATION Bilateral 08/29/2013   Procedure: HOLMIUM LASER APPLICATION;  Surgeon: Ricardo Likens, MD;  Location: [facility];  Service: Urology;  Laterality: Bilateral;   Patient Active Problem List   Diagnosis Date Noted   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) 09/19/2024   Family history of cholangiocarcinoma 09/10/2024   Squamous cell carcinoma of breast, left 09/10/2024   Family history of colon cancer 09/10/2024   Family history of lung cancer 09/10/2024   Family history of liver cancer 09/10/2024   Melanoma of skin (HCC) 09/10/2024   Arterial calcification 06/12/2024   History of resection of meningioma 11/02/2020   Peripheral edema    Hypoalbuminemia due to protein-calorie malnutrition    Transaminitis    Tremor    Iron deficiency anemia    CKD (chronic kidney disease), stage III (HCC) 09/10/2020   Vasogenic Cerebral edema (HCC) 09/04/2020   Nephrolithiasis 09/04/2020   Essential hypertension 11/01/2015   Osteoporosis 10/30/2015    REFERRING PROVIDER: Shoulders Rayas  MD  REFERRING DIAG: Left shoulder arthritis s/p shoulder replacement.   THERAPY DIAG:  Muscle weakness (generalized)  Stiffness of left shoulder, not elsewhere classified  Rationale for Evaluation and Treatment: Rehabilitation  ONSET DATE: September, 2025.  SUBJECTIVE:                                                                                                                                                                                       SUBJECTIVE STATEMENT: Pt reports 4-5/10 left shoulder soreness today.   PERTINENT HISTORY: Please see above.    PAIN:  Are you having pain? Yes: NPRS scale: 4-5/10 Pain location: left shoulder  PRECAUTIONS: Other: H/o cancer.  RED FLAGS: None   WEIGHT BEARING RESTRICTIONS: No  FALLS:  Has patient fallen in last 6 months? No  LIVING ENVIRONMENT: Lives in: House/apartment Has following equipment at home: None  PLOF: Independent  PATIENT GOALS:  Get more use from left shoulder.  NEXT MD VISIT:   OBJECTIVE:   PATIENT SURVEYS:  QUICKDASH:  20 points (20.46).  POSTURE: Forward head and rounded shoulders.  UPPER EXTREMITY ROM:   Patient has no active left shoulder while seated against gravity.  In supine she has 30 degrees of flexion and passively to 90 degrees.  ER is -10 degrees from neutral.    UPPER EXTREMITY MMT:  Left shoulder deltoid strength is 2-/5, ER is 1+ to 2-/5.  IR is 4-/5.    OBSERVATION:                                                                                                                            TREATMENT DATE: 09-26-24                                    EXERCISE LOG      LT shldr  Exercise Repetitions and Resistance Comments  Pulleys 6 mins   Ranger seated 6 mins   Ball on Table 2.5 mins f/b and v's   Red bar 2x10   Wall ladder X5  Supine Punches AAROM 2x10   Supine Circles CW/CC 2x10 each challenging   Blank cell = exercise not performed today  Manual PROM/  AAROM  for elevation, ER, and scaption.   Left axillary incision appear to be healing well.    PATIENT EDUCATION: Education details: See below. Person educated: Patient Education method: Explanation, Demonstration, Tactile cues, Verbal cues, and Handouts Education comprehension: verbalized understanding, returned demonstration, and verbal cues required  HOME EXERCISE  PROGRAM: CANE CANE  [[id number]]  Seated Wand UE External Rotation -  Repeat 10 Repetitions, Hold 10 Seconds, Complete 2 Sets, Perform 3 Times a Day  Closed chain flexion stretch -  Repeat 10 Repetitions, Hold 10 Seconds, Complete 2 Sets, Perform 4 Times a Day  WAND EXTERNAL ROTATION - SUPINE ER -  Repeat 10 Repetitions, Hold 10 Seconds, Complete 2 Sets, Perform 4 Times a Day  ASSESSMENT:  CLINICAL IMPRESSION: Pt arrives for today's treatment session reporting 4-5/10 left shoulder pain.  Pt reports that she begins radiation treatments next week and will be required to place her left arm over her head to allow doctors access to the spot in question.  Pt instructed to discuss any conflicts that radiation and physical therapy may present.  Pt does well with AA exercises.  Pt reported slight decrease in pain at completion of today's treatment session.   OBJECTIVE IMPAIRMENTS: decreased activity tolerance, decreased ROM, and decreased strength.   ACTIVITY LIMITATIONS: carrying, lifting, and reach over head  PARTICIPATION LIMITATIONS: meal prep, cleaning, and laundry  PERSONAL FACTORS: Time since onset of injury/illness/exacerbation and 1-2 comorbidities: excision if left axillary region (lymph node) left hemi shoulder replacement are also affecting patient's functional outcome.   REHAB POTENTIAL: Fair /Fair+  CLINICAL DECISION MAKING: Stable/uncomplicated  EVALUATION COMPLEXITY: Low   GOALS:  SHORT TERM GOALS: Target date: 10/02/24  Ind with a HEP. Goal status: INITIAL   LONG TERM GOALS: Target date: 12/17/24  Ind with an advanced HEP.  Goal status: INITIAL  2.  Active left shoulder flexion to 125 degrees so the patient can easily reach overhead.  Goal status: INITIAL  3.  Active ER to 50 degrees+ to allow for easily donning/doffing of apparel.  Goal status: INITIAL  4.  Increase left shoulder strength to a solid 3+/5 to increase stability for performance of functional  activities.  Goal status: INITIAL  5.  Improve QUICKDASH score by  Baseline:  Goal status: INITIAL  PLAN:  PT FREQUENCY: 2x/week  PT DURATION: 6 weeks  PLANNED INTERVENTIONS: 97110-Therapeutic exercises, 97530- Therapeutic activity, [id number]- Neuromuscular re-education, 97535- Self Care, [id number]- Manual therapy, and Patient/Family education  PLAN FOR NEXT SESSION: Pulleys, UE Ranger (begin seate), P and AAROM, isometrics, wall ladder.     Delon DELENA Gosling, PTA 09/26/2024, 11:52 AM  "

## 2024-10-01 ENCOUNTER — Ambulatory Visit
Admission: RE | Admit: 2024-10-01 | Discharge: 2024-10-01 | Disposition: A | Discharge status: Home / Self Care | Source: Ambulatory Visit | Attending: Radiation Oncology | Admitting: Radiation Oncology

## 2024-10-01 DIAGNOSIS — C773 Secondary and unspecified malignant neoplasm of axilla and upper limb lymph nodes: Secondary | ICD-10-CM | POA: Insufficient documentation

## 2024-10-01 DIAGNOSIS — Z510 Encounter for antineoplastic radiation therapy: Secondary | ICD-10-CM | POA: Diagnosis present

## 2024-10-01 DIAGNOSIS — C801 Malignant (primary) neoplasm, unspecified: Secondary | ICD-10-CM | POA: Diagnosis not present

## 2024-10-02 ENCOUNTER — Ambulatory Visit

## 2024-10-02 DIAGNOSIS — M25612 Stiffness of left shoulder, not elsewhere classified: Secondary | ICD-10-CM

## 2024-10-02 DIAGNOSIS — M6281 Muscle weakness (generalized): Secondary | ICD-10-CM

## 2024-10-02 NOTE — Therapy (Signed)
 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   sees Dr Ivin   Dysuria    Frequency of urination    Hematuria    History of kidney stones    Hypertension    Melanoma (HCC)    Meningioma (HCC) 09/04/2020   Osteoporosis    Renal calculus, bilateral    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   Urgency of urination    Wears glasses    Past Surgical History:  Procedure Laterality Date   APPLICATION OF CRANIAL NAVIGATION Right 09/11/2020   Procedure: APPLICATION OF CRANIAL NAVIGATION;  Surgeon: Debby Dorn MATSU, MD;  Location: [facility] OR;  Service: Neurosurgery;  Laterality: Right;   CRANIOTOMY Right 09/11/2020   Procedure: Right Pterional Craniotomy for resection of anterior skull base mass with Brain Lab;  Surgeon: Debby Dorn MATSU, MD;  Location: [facility] OR;  Service: Neurosurgery;  Laterality: Right;   CYSTOSCOPY WITH RETROGRADE PYELOGRAM, URETEROSCOPY AND STENT PLACEMENT Bilateral 08/11/2013   Procedure: CYSTOSCOPY WITH RETROGRADE PYELOGRAM, URETEROSCOPY AND STENT PLACEMENT;  Surgeon: Ricardo Likens, MD;  Location: [facility];  Service: Urology;  Laterality: Bilateral;   CYSTOSCOPY WITH URETEROSCOPY AND STENT PLACEMENT Bilateral 08/29/2013   Procedure: CYSTOSCOPY WITH BILATERAL URETEROSCOPY AND STENT REPLACEMENTS, stone extraction;  Surgeon: Ricardo Likens, MD;  Location: [facility];  Service: Urology;  Laterality: Bilateral;   CYSTOSCOPY/RETROGRADE/URETEROSCOPY/STONE EXTRACTION WITH BASKET Bilateral  01/31/2016   Procedure: CYSTOSCOPY/BILATERAL RETROGRADE PYELOGRAMS/BILATERAL URETEROSCOPY/BILATERAL STONE EXTRACTION WITH BASKET/RIGHT URETERAL STENT PLACEMENT;  Surgeon: Gretel Ferrara, MD;  Location: [facility];  Service: Urology;  Laterality: Bilateral;   EXCISION, MASS, UPPER EXTREMITY Left 08/23/2024   Procedure: EXCISION, MASS, UPPER EXTREMITY;  Surgeon: Aron Shoulders, MD;  Location: [facility] OR;  Service: General;  Laterality: Left;  EXCISION OF LEFT AXILLARY MASS   HEMIARTHROPLASTY SHOULDER FRACTURE Left 09/20/1992   HOLMIUM LASER APPLICATION Bilateral 08/29/2013   Procedure: HOLMIUM LASER APPLICATION;  Surgeon: Ricardo Likens, MD;  Location: [facility];  Service: Urology;  Laterality: Bilateral;   Patient Active Problem List   Diagnosis Date Noted   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) 09/19/2024   Family history of cholangiocarcinoma 09/10/2024   Squamous cell carcinoma of breast, left 09/10/2024   Family history of colon cancer 09/10/2024   Family history of lung cancer 09/10/2024   Family history of liver cancer 09/10/2024   Melanoma of skin (HCC) 09/10/2024   Arterial calcification 06/12/2024   History of resection of meningioma 11/02/2020   Peripheral edema    Hypoalbuminemia due to protein-calorie malnutrition    Transaminitis    Tremor    Iron deficiency anemia    CKD (chronic kidney disease), stage III (HCC) 09/10/2020   Vasogenic Cerebral edema (HCC) 09/04/2020   Nephrolithiasis 09/04/2020   Essential hypertension 11/01/2015   Osteoporosis 10/30/2015    REFERRING PROVIDER: Shoulders Rayas  MD  REFERRING DIAG: Left shoulder arthritis s/p shoulder replacement.   THERAPY DIAG:  Muscle weakness (generalized)  Stiffness of left shoulder, not elsewhere classified  Rationale for Evaluation and Treatment: Rehabilitation  ONSET DATE: September, 2025.  SUBJECTIVE:                                                                                                                                                                                       SUBJECTIVE STATEMENT: Pt reports 4/10 left shoulder soreness today.   Pt states she will be able to start her radiation treatments next week.   PERTINENT HISTORY: Please see above.    PAIN:  Are you having pain? Yes: NPRS scale: 4/10 Pain location: left shoulder  PRECAUTIONS: Other: H/o cancer.  RED FLAGS: None   WEIGHT BEARING RESTRICTIONS: No  FALLS:  Has patient fallen in last 6 months? No  LIVING ENVIRONMENT: Lives in: House/apartment Has following equipment at home: None  PLOF: Independent  PATIENT GOALS:  Get more use from left shoulder.  NEXT MD VISIT:   OBJECTIVE:   PATIENT SURVEYS:  QUICKDASH:  20 points (20.46).  POSTURE: Forward head and rounded shoulders.  UPPER EXTREMITY ROM:   Patient has no active left shoulder while seated against gravity.  In supine she has 30 degrees of flexion and passively to 90 degrees.  ER is -10 degrees from neutral.    UPPER EXTREMITY MMT:  Left shoulder deltoid strength is 2-/5, ER is 1+ to 2-/5.  IR is 4-/5.    OBSERVATION:                                                                                                                            TREATMENT DATE: 10-02-24                                    EXERCISE LOG      LT shldr  Exercise Repetitions and Resistance Comments  Pulleys 6.5 mins   Ranger seated 6.5 mins   Ball on Table 3  mins f/b and v's   Red bar 25 reps up/down   Ball Squeezes 2.5 mins   Wall ladder X5    Supine Punches AAROM 25 reps   Supine Circles CW/CC 25 reps each challenging   Blank cell = exercise not performed today  Manual PROM/  AAROM  for elevation, ER, and scaption.  PATIENT EDUCATION: Education details: See below. Person educated: Patient Education method: Explanation, Demonstration, Tactile cues, Verbal cues, and Handouts Education comprehension: verbalized understanding, returned  demonstration, and verbal cues required  HOME EXERCISE PROGRAM: CANE CANE  [[id number]]  Seated Wand UE External Rotation -  Repeat 10 Repetitions, Hold 10 Seconds, Complete 2 Sets, Perform 3 Times a Day  Closed chain flexion stretch -  Repeat 10 Repetitions, Hold 10 Seconds, Complete 2 Sets, Perform 4 Times a Day  WAND EXTERNAL ROTATION - SUPINE ER -  Repeat 10 Repetitions, Hold 10 Seconds, Complete 2 Sets, Perform 4 Times a Day  ASSESSMENT:  CLINICAL IMPRESSION: Pt arrives for today's treatment session reporting 4/10 left shoulder pain.  Pt reports that she begins radiation treatments next week and the doctor was able to position her arm in a way that was conducive to treatments.  Pt able to tolerate increased reps with previously performed exercises without discomfort, but fatigue noted.  PROM/AAROM performed into flexion and ER with gentle hold at end range to tolerance.  Pt reported slight decrease in pain at completion of today's treatment session.  OBJECTIVE IMPAIRMENTS: decreased activity tolerance, decreased ROM, and decreased strength.   ACTIVITY LIMITATIONS: carrying, lifting, and reach over head  PARTICIPATION LIMITATIONS: meal prep, cleaning, and laundry  PERSONAL FACTORS: Time since onset of injury/illness/exacerbation and 1-2 comorbidities: excision if left axillary region (lymph node) left hemi shoulder replacement are also affecting patient's functional outcome.   REHAB POTENTIAL: Fair /Fair+  CLINICAL DECISION MAKING: Stable/uncomplicated  EVALUATION COMPLEXITY: Low   GOALS:  SHORT TERM GOALS: Target date: 10/02/24  Ind with a HEP. Goal status: INITIAL   LONG TERM GOALS: Target date: 12/17/24  Ind with an advanced HEP.  Goal status: INITIAL  2.  Active left shoulder flexion to 125 degrees so the patient can easily reach overhead.  Goal status: INITIAL  3.  Active ER to 50 degrees+ to allow for easily donning/doffing of apparel.  Goal status: INITIAL  4.   Increase left shoulder strength to a solid 3+/5 to increase stability for performance of functional activities.  Goal status: INITIAL  5.  Improve QUICKDASH score by  Baseline:  Goal status: INITIAL  PLAN:  PT FREQUENCY: 2x/week  PT DURATION: 6 weeks  PLANNED INTERVENTIONS: 97110-Therapeutic exercises, 97530- Therapeutic activity, [id number]- Neuromuscular re-education, 97535- Self Care, [id number]- Manual therapy, and Patient/Family education  PLAN FOR NEXT SESSION: Pulleys, UE Ranger (begin seate), P and AAROM, isometrics, wall ladder.     Delon DELENA Gosling, PTA 10/02/2024, 11:37 AM  "

## 2024-10-04 ENCOUNTER — Ambulatory Visit: Admitting: Physical Therapy

## 2024-10-04 DIAGNOSIS — M6281 Muscle weakness (generalized): Secondary | ICD-10-CM | POA: Diagnosis not present

## 2024-10-04 DIAGNOSIS — M25612 Stiffness of left shoulder, not elsewhere classified: Secondary | ICD-10-CM

## 2024-10-04 NOTE — Therapy (Signed)
 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   sees Dr Ivin   Dysuria    Frequency of urination    Hematuria    History of kidney stones    Hypertension    Melanoma (HCC)    Meningioma (HCC) 09/04/2020   Osteoporosis    Renal calculus, bilateral    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   Urgency of urination    Wears glasses    Past Surgical History:  Procedure Laterality Date   APPLICATION OF CRANIAL NAVIGATION Right 09/11/2020   Procedure: APPLICATION OF CRANIAL NAVIGATION;  Surgeon: Debby Dorn MATSU, MD;  Location: [facility] OR;  Service: Neurosurgery;  Laterality: Right;   CRANIOTOMY Right 09/11/2020   Procedure: Right Pterional Craniotomy for resection of anterior skull base mass with Brain Lab;  Surgeon: Debby Dorn MATSU, MD;  Location: [facility] OR;  Service: Neurosurgery;  Laterality: Right;   CYSTOSCOPY WITH RETROGRADE PYELOGRAM, URETEROSCOPY AND STENT PLACEMENT Bilateral 08/11/2013   Procedure: CYSTOSCOPY WITH RETROGRADE PYELOGRAM, URETEROSCOPY AND STENT PLACEMENT;  Surgeon: Ricardo Likens, MD;  Location: [facility];  Service: Urology;  Laterality: Bilateral;   CYSTOSCOPY WITH URETEROSCOPY AND STENT PLACEMENT Bilateral 08/29/2013   Procedure: CYSTOSCOPY WITH BILATERAL URETEROSCOPY AND STENT REPLACEMENTS, stone extraction;  Surgeon: Ricardo Likens, MD;  Location: [facility];   Service: Urology;  Laterality: Bilateral;   CYSTOSCOPY/RETROGRADE/URETEROSCOPY/STONE EXTRACTION WITH BASKET Bilateral 01/31/2016   Procedure: CYSTOSCOPY/BILATERAL RETROGRADE PYELOGRAMS/BILATERAL URETEROSCOPY/BILATERAL STONE EXTRACTION WITH BASKET/RIGHT URETERAL STENT PLACEMENT;  Surgeon: Gretel Ferrara, MD;  Location: [facility];  Service: Urology;  Laterality: Bilateral;   EXCISION, MASS, UPPER EXTREMITY Left 08/23/2024   Procedure: EXCISION, MASS, UPPER EXTREMITY;  Surgeon: Aron Shoulders, MD;  Location: [facility] OR;  Service: General;  Laterality: Left;  EXCISION OF LEFT AXILLARY MASS   HEMIARTHROPLASTY SHOULDER FRACTURE Left 09/20/1992   HOLMIUM LASER APPLICATION Bilateral 08/29/2013   Procedure: HOLMIUM LASER APPLICATION;  Surgeon: Ricardo Likens, MD;  Location: [facility];  Service: Urology;  Laterality: Bilateral;   Patient Active Problem List   Diagnosis Date Noted   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) 09/19/2024   Family history of cholangiocarcinoma 09/10/2024   Squamous cell carcinoma of breast, left 09/10/2024   Family history of colon cancer 09/10/2024   Family history of lung cancer 09/10/2024   Family history of liver cancer 09/10/2024   Melanoma of skin (HCC) 09/10/2024   Arterial calcification 06/12/2024   History of resection of meningioma 11/02/2020   Peripheral edema    Hypoalbuminemia due to protein-calorie malnutrition    Transaminitis    Tremor    Iron deficiency anemia    CKD (chronic kidney disease), stage III (HCC) 09/10/2020   Vasogenic Cerebral edema (HCC) 09/04/2020  Nephrolithiasis 09/04/2020   Essential hypertension 11/01/2015   Osteoporosis 10/30/2015    REFERRING PROVIDER: Jina Rayas MD  REFERRING DIAG: Left shoulder arthritis s/p shoulder replacement.   THERAPY DIAG:  Muscle weakness (generalized)  Stiffness of left shoulder, not elsewhere classified  Rationale for Evaluation and Treatment: Rehabilitation  ONSET DATE:  September, 2025.  SUBJECTIVE:                                                                                                                                                                                      SUBJECTIVE STATEMENT: No new complaints.  PERTINENT HISTORY: Please see above.    PAIN:  Are you having pain? Yes: NPRS scale: 4/10 Pain location: left shoulder  PRECAUTIONS: Other: H/o cancer.  RED FLAGS: None   WEIGHT BEARING RESTRICTIONS: No  FALLS:  Has patient fallen in last 6 months? No  LIVING ENVIRONMENT: Lives in: House/apartment Has following equipment at home: None  PLOF: Independent  PATIENT GOALS:  Get more use from left shoulder.  NEXT MD VISIT:   OBJECTIVE:   PATIENT SURVEYS:  QUICKDASH:  20 points (20.46).  POSTURE: Forward head and rounded shoulders.  UPPER EXTREMITY ROM:   Patient has no active left shoulder while seated against gravity.  In supine she has 30 degrees of flexion and passively to 90 degrees.  ER is -10 degrees from neutral.    UPPER EXTREMITY MMT:  Left shoulder deltoid strength is 2-/5, ER is 1+ to 2-/5.  IR is 4-/5.    OBSERVATION:                                                                                                                            TREATMENT DATE:    10/04/24:                                       EXERCISE LOG  Exercise Repetitions and Resistance Comments  UBE 120 RPM's x 8 minutes (active-assistive)    Pulleys 5 minutes    UE Ranger (  seated) 6 minutes    Wall ladder 4 minutes    Ball on table  4 minutes        1-1 PROM x 13 minutes to patient's   10-02-24                                    EXERCISE LOG      LT shldr  Exercise Repetitions and Resistance Comments  Pulleys 6.5 mins   Ranger seated 6.5 mins   Ball on Table 3 mins f/b and v's   Red bar 25 reps up/down   Ball Squeezes 2.5 mins   Wall ladder X5    Supine Punches AAROM 25 reps   Supine Circles CW/CC 25 reps each  challenging   Blank cell = exercise not performed today  Manual PROM/  AAROM  for elevation, ER, and scaption.  PATIENT EDUCATION: Education details: See below. Person educated: Patient Education method: Explanation, Demonstration, Tactile cues, Verbal cues, and Handouts Education comprehension: verbalized understanding, returned demonstration, and verbal cues required  HOME EXERCISE PROGRAM: CANE CANE  [[id number]]  Seated Wand UE External Rotation -  Repeat 10 Repetitions, Hold 10 Seconds, Complete 2 Sets, Perform 3 Times a Day  Closed chain flexion stretch -  Repeat 10 Repetitions, Hold 10 Seconds, Complete 2 Sets, Perform 4 Times a Day  WAND EXTERNAL ROTATION - SUPINE ER -  Repeat 10 Repetitions, Hold 10 Seconds, Complete 2 Sets, Perform 4 Times a Day  ASSESSMENT:  CLINICAL IMPRESSION: Incorporated UBE for ROM and muscle activation (pushing/pulling) in an active-assistive fashion which she did well with.  Patient's left shoulder remains stiff.    OBJECTIVE IMPAIRMENTS: decreased activity tolerance, decreased ROM, and decreased strength.   ACTIVITY LIMITATIONS: carrying, lifting, and reach over head  PARTICIPATION LIMITATIONS: meal prep, cleaning, and laundry  PERSONAL FACTORS: Time since onset of injury/illness/exacerbation and 1-2 comorbidities: excision if left axillary region (lymph node) left hemi shoulder replacement are also affecting patient's functional outcome.   REHAB POTENTIAL: Fair /Fair+  CLINICAL DECISION MAKING: Stable/uncomplicated  EVALUATION COMPLEXITY: Low   GOALS:  SHORT TERM GOALS: Target date: 10/02/24  Ind with a HEP. Goal status: INITIAL   LONG TERM GOALS: Target date: 12/17/24  Ind with an advanced HEP.  Goal status: INITIAL  2.  Active left shoulder flexion to 125 degrees so the patient can easily reach overhead.  Goal status: INITIAL  3.  Active ER to 50 degrees+ to allow for easily donning/doffing of apparel.  Goal status:  INITIAL  4.  Increase left shoulder strength to a solid 3+/5 to increase stability for performance of functional activities.  Goal status: INITIAL  5.  Improve QUICKDASH score by  Baseline:  Goal status: INITIAL  PLAN:  PT FREQUENCY: 2x/week  PT DURATION: 6 weeks  PLANNED INTERVENTIONS: 97110-Therapeutic exercises, 97530- Therapeutic activity, [id number]- Neuromuscular re-education, 97535- Self Care, [id number]- Manual therapy, and Patient/Family education  PLAN FOR NEXT SESSION: Pulleys, UE Ranger (begin seate), P and AAROM, isometrics, wall ladder.     Warrick Llera, PT 10/04/2024, 12:12 PM  "

## 2024-10-05 DIAGNOSIS — Z510 Encounter for antineoplastic radiation therapy: Secondary | ICD-10-CM | POA: Diagnosis not present

## 2024-10-08 ENCOUNTER — Ambulatory Visit: Admitting: Physical Therapy

## 2024-10-08 DIAGNOSIS — M25612 Stiffness of left shoulder, not elsewhere classified: Secondary | ICD-10-CM

## 2024-10-08 DIAGNOSIS — M6281 Muscle weakness (generalized): Secondary | ICD-10-CM | POA: Diagnosis not present

## 2024-10-08 NOTE — Therapy (Addendum)
 " OUTPATIENT PHYSICAL THERAPY SHOULDER TREATMENT   Patient Name: [name] Page MRN: [id number] DOB:09/28/1950, 74 y.o., female Today's Date: 10/08/2024  END OF SESSION:  PT End of Session - 10/08/24 1145     Visit Number 6    Number of Visits 12    Date for Recertification  12/17/24    PT Start Time 1101    PT Stop Time 1145    PT Time Calculation (min) 44 min    Activity Tolerance Patient tolerated treatment well    Behavior During Therapy [facility] for tasks assessed/performed            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   sees Dr Ivin   Dysuria    Frequency of urination    Hematuria    History of kidney stones    Hypertension    Melanoma (HCC)    Meningioma (HCC) 09/04/2020   Osteoporosis    Renal calculus, bilateral    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   Urgency of urination    Wears glasses    Past Surgical History:  Procedure Laterality Date   APPLICATION OF CRANIAL NAVIGATION Right 09/11/2020   Procedure: APPLICATION OF CRANIAL NAVIGATION;  Surgeon: Debby Dorn MATSU, MD;  Location: [place] OR;  Service: Neurosurgery;  Laterality: Right;   CRANIOTOMY Right 09/11/2020   Procedure: Right Pterional Craniotomy for resection of anterior skull base mass with Brain Lab;  Surgeon: Debby Dorn MATSU, MD;  Location: [facility] OR;  Service: Neurosurgery;  Laterality: Right;   CYSTOSCOPY WITH RETROGRADE PYELOGRAM, URETEROSCOPY AND STENT PLACEMENT Bilateral 08/11/2013   Procedure: CYSTOSCOPY WITH RETROGRADE PYELOGRAM, URETEROSCOPY AND STENT PLACEMENT;  Surgeon: Ricardo Likens, MD;  Location: [facility];  Service: Urology;  Laterality: Bilateral;   CYSTOSCOPY WITH URETEROSCOPY AND STENT PLACEMENT Bilateral 08/29/2013   Procedure: CYSTOSCOPY WITH BILATERAL URETEROSCOPY AND STENT REPLACEMENTS, stone extraction;  Surgeon: Ricardo Likens, MD;  Location: [facility];   Service: Urology;  Laterality: Bilateral;   CYSTOSCOPY/RETROGRADE/URETEROSCOPY/STONE EXTRACTION WITH BASKET Bilateral 01/31/2016   Procedure: CYSTOSCOPY/BILATERAL RETROGRADE PYELOGRAMS/BILATERAL URETEROSCOPY/BILATERAL STONE EXTRACTION WITH BASKET/RIGHT URETERAL STENT PLACEMENT;  Surgeon: Gretel Ferrara, MD;  Location: [facility];  Service: Urology;  Laterality: Bilateral;   EXCISION, MASS, UPPER EXTREMITY Left 08/23/2024   Procedure: EXCISION, MASS, UPPER EXTREMITY;  Surgeon: Aron Shoulders, MD;  Location: [facility] OR;  Service: General;  Laterality: Left;  EXCISION OF LEFT AXILLARY MASS   HEMIARTHROPLASTY SHOULDER FRACTURE Left 09/20/1992   HOLMIUM LASER APPLICATION Bilateral 08/29/2013   Procedure: HOLMIUM LASER APPLICATION;  Surgeon: Ricardo Likens, MD;  Location: [facility];  Service: Urology;  Laterality: Bilateral;   Patient Active Problem List   Diagnosis Date Noted   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) 09/19/2024   Family history of cholangiocarcinoma 09/10/2024   Squamous cell carcinoma of breast, left 09/10/2024   Family history of colon cancer 09/10/2024   Family history of lung cancer 09/10/2024   Family history of liver cancer 09/10/2024   Melanoma of skin (HCC) 09/10/2024   Arterial calcification 06/12/2024   History of resection of meningioma 11/02/2020   Peripheral edema    Hypoalbuminemia due to protein-calorie malnutrition    Transaminitis    Tremor    Iron deficiency anemia    CKD (chronic kidney disease), stage III (HCC) 09/10/2020   Vasogenic Cerebral edema (HCC)  09/04/2020   Nephrolithiasis 09/04/2020   Essential hypertension 11/01/2015   Osteoporosis 10/30/2015    REFERRING PROVIDER: Jina Rayas MD  REFERRING DIAG: Left shoulder arthritis s/p shoulder replacement.   THERAPY DIAG:  Muscle weakness (generalized)  Stiffness of left shoulder, not elsewhere classified  Rationale for Evaluation and Treatment: Rehabilitation  ONSET DATE:  September, 2025.  SUBJECTIVE:                                                                                                                                                                                      SUBJECTIVE STATEMENT: Feel stove up today.  PERTINENT HISTORY: Please see above.    PAIN:  Are you having pain? Yes: NPRS scale: 4/10 Pain location: left shoulder  PRECAUTIONS: Other: H/o cancer.  RED FLAGS: None   WEIGHT BEARING RESTRICTIONS: No  FALLS:  Has patient fallen in last 6 months? No  LIVING ENVIRONMENT: Lives in: House/apartment Has following equipment at home: None  PLOF: Independent  PATIENT GOALS:  Get more use from left shoulder.  NEXT MD VISIT:   OBJECTIVE:   PATIENT SURVEYS:  QUICKDASH:  20 points (20.46).  POSTURE: Forward head and rounded shoulders.  UPPER EXTREMITY ROM:   Patient has no active left shoulder while seated against gravity.  In supine she has 30 degrees of flexion and passively to 90 degrees.  ER is -10 degrees from neutral.    UPPER EXTREMITY MMT:  Left shoulder deltoid strength is 2-/5, ER is 1+ to 2-/5.  IR is 4-/5.    OBSERVATION:                                                                                                                            TREATMENT DATE:   10/08/24:                                     EXERCISE LOG  Exercise Repetitions and Resistance Comments   120 RPM's x 8 minutes (active-assistive) for ROM and muscle activation   Pulleys  5  minutes   Seated UE Ranger 5 minutes   Wall ladder 5 minutes        1-1 PROM x 13 minutes to patient's left shoulder.   10/04/24:                                       EXERCISE LOG  Exercise Repetitions and Resistance Comments  UBE 120 RPM's x 8 minutes (active-assistive)    Pulleys 5 minutes    UE Ranger (seated) 6 minutes    Wall ladder 4 minutes    Ball on table  4 minutes        1-1 PROM x 13 minutes to patient's   10-02-24                                     EXERCISE LOG      LT shldr  Exercise Repetitions and Resistance Comments  Pulleys 6.5 mins   Ranger seated 6.5 mins   Ball on Table 3 mins f/b and v's   Red bar 25 reps up/down   Ball Squeezes 2.5 mins   Wall ladder X5    Supine Punches AAROM 25 reps   Supine Circles CW/CC 25 reps each challenging   Blank cell = exercise not performed today  Manual PROM/  AAROM  for elevation, ER, and scaption.  PATIENT EDUCATION: Education details: See below. Person educated: Patient Education method: Explanation, Demonstration, Tactile cues, Verbal cues, and Handouts Education comprehension: verbalized understanding, returned demonstration, and verbal cues required  HOME EXERCISE PROGRAM: CANE CANE  [[id number]]  Seated Wand UE External Rotation -  Repeat 10 Repetitions, Hold 10 Seconds, Complete 2 Sets, Perform 3 Times a Day  Closed chain flexion stretch -  Repeat 10 Repetitions, Hold 10 Seconds, Complete 2 Sets, Perform 4 Times a Day  WAND EXTERNAL ROTATION - SUPINE ER -  Repeat 10 Repetitions, Hold 10 Seconds, Complete 2 Sets, Perform 4 Times a Day  ASSESSMENT:  CLINICAL IMPRESSION: Patient reporting feeling stove up and attributes some of this to the cold weather.  She tolerated tx without complaint today.  OBJECTIVE IMPAIRMENTS: decreased activity tolerance, decreased ROM, and decreased strength.   ACTIVITY LIMITATIONS: carrying, lifting, and reach over head  PARTICIPATION LIMITATIONS: meal prep, cleaning, and laundry  PERSONAL FACTORS: Time since onset of injury/illness/exacerbation and 1-2 comorbidities: excision if left axillary region (lymph node) left hemi shoulder replacement are also affecting patient's functional outcome.   REHAB POTENTIAL: Fair /Fair+  CLINICAL DECISION MAKING: Stable/uncomplicated  EVALUATION COMPLEXITY: Low   GOALS:  SHORT TERM GOALS: Target date: 10/02/24  Ind with a HEP. Goal status: INITIAL   LONG TERM GOALS: Target  date: 12/17/24  Ind with an advanced HEP.  Goal status: INITIAL  2.  Active left shoulder flexion to 125 degrees so the patient can easily reach overhead.  Goal status: INITIAL  3.  Active ER to 50 degrees+ to allow for easily donning/doffing of apparel.  Goal status: INITIAL  4.  Increase left shoulder strength to a solid 3+/5 to increase stability for performance of functional activities.  Goal status: INITIAL  5.  Improve QUICKDASH score by  Baseline:  Goal status: INITIAL  PLAN:  PT FREQUENCY: 2x/week  PT DURATION: 6 weeks  PLANNED INTERVENTIONS: 97110-Therapeutic exercises, 97530- Therapeutic activity, [id number]- Neuromuscular re-education, 97535- Self Care,  02859- Manual therapy, and Patient/Family education  PLAN FOR NEXT SESSION: Pulleys, UE Ranger (begin seate), P and AAROM, isometrics, wall ladder.     Avory Mimbs, PT 10/08/2024, 11:45 AM  "

## 2024-10-09 ENCOUNTER — Ambulatory Visit
Admission: RE | Admit: 2024-10-09 | Discharge: 2024-10-09 | Attending: Radiation Oncology | Admitting: Radiation Oncology

## 2024-10-09 ENCOUNTER — Other Ambulatory Visit: Payer: Self-pay

## 2024-10-09 DIAGNOSIS — Z510 Encounter for antineoplastic radiation therapy: Secondary | ICD-10-CM | POA: Diagnosis not present

## 2024-10-09 LAB — RAD ONC ARIA SESSION SUMMARY
Course Elapsed Days: 0
Plan Fractions Treated to Date: 1
Plan Prescribed Dose Per Fraction: 2.5 Gy
Plan Total Fractions Prescribed: 20
Plan Total Prescribed Dose: 50 Gy
Reference Point Dosage Given to Date: 2.5 Gy
Reference Point Session Dosage Given: 2.5 Gy
Session Number: 1

## 2024-10-10 ENCOUNTER — Other Ambulatory Visit: Payer: Self-pay

## 2024-10-10 ENCOUNTER — Ambulatory Visit: Admission: RE | Admit: 2024-10-10 | Discharge: 2024-10-10 | Attending: Radiation Oncology

## 2024-10-10 ENCOUNTER — Ambulatory Visit
Admission: RE | Admit: 2024-10-10 | Discharge: 2024-10-10 | Disposition: A | Source: Ambulatory Visit | Attending: Radiation Oncology

## 2024-10-10 DIAGNOSIS — Z510 Encounter for antineoplastic radiation therapy: Secondary | ICD-10-CM | POA: Diagnosis not present

## 2024-10-10 DIAGNOSIS — C44521 Squamous cell carcinoma of skin of breast: Secondary | ICD-10-CM

## 2024-10-10 DIAGNOSIS — C773 Secondary and unspecified malignant neoplasm of axilla and upper limb lymph nodes: Secondary | ICD-10-CM

## 2024-10-10 LAB — RAD ONC ARIA SESSION SUMMARY
Course Elapsed Days: 1
Plan Fractions Treated to Date: 2
Plan Prescribed Dose Per Fraction: 2.5 Gy
Plan Total Fractions Prescribed: 20
Plan Total Prescribed Dose: 50 Gy
Reference Point Dosage Given to Date: 5 Gy
Reference Point Session Dosage Given: 2.5 Gy
Session Number: 2

## 2024-10-10 MED ORDER — RADIAPLEXRX EX GEL
Freq: Once | CUTANEOUS | Status: AC
  Administered 2024-10-10: 1 via TOPICAL

## 2024-10-11 ENCOUNTER — Ambulatory Visit

## 2024-10-11 ENCOUNTER — Other Ambulatory Visit: Payer: Self-pay

## 2024-10-11 ENCOUNTER — Ambulatory Visit
Admission: RE | Admit: 2024-10-11 | Discharge: 2024-10-11 | Disposition: A | Discharge status: Home / Self Care | Source: Ambulatory Visit | Attending: Radiation Oncology | Admitting: Radiation Oncology

## 2024-10-11 DIAGNOSIS — Z510 Encounter for antineoplastic radiation therapy: Secondary | ICD-10-CM | POA: Diagnosis not present

## 2024-10-11 LAB — RAD ONC ARIA SESSION SUMMARY
Course Elapsed Days: 2
Plan Fractions Treated to Date: 3
Plan Prescribed Dose Per Fraction: 2.5 Gy
Plan Total Fractions Prescribed: 20
Plan Total Prescribed Dose: 50 Gy
Reference Point Dosage Given to Date: 7.5 Gy
Reference Point Session Dosage Given: 2.5 Gy
Session Number: 3

## 2024-10-12 ENCOUNTER — Ambulatory Visit
Admission: RE | Admit: 2024-10-12 | Discharge: 2024-10-12 | Disposition: A | Source: Ambulatory Visit | Attending: Radiation Oncology

## 2024-10-12 ENCOUNTER — Other Ambulatory Visit: Payer: Self-pay

## 2024-10-12 DIAGNOSIS — Z510 Encounter for antineoplastic radiation therapy: Secondary | ICD-10-CM | POA: Diagnosis not present

## 2024-10-12 LAB — RAD ONC ARIA SESSION SUMMARY
Course Elapsed Days: 3
Plan Fractions Treated to Date: 4
Plan Prescribed Dose Per Fraction: 2.5 Gy
Plan Total Fractions Prescribed: 20
Plan Total Prescribed Dose: 50 Gy
Reference Point Dosage Given to Date: 10 Gy
Reference Point Session Dosage Given: 2.5 Gy
Session Number: 4

## 2024-10-15 ENCOUNTER — Ambulatory Visit

## 2024-10-16 ENCOUNTER — Ambulatory Visit

## 2024-10-16 ENCOUNTER — Encounter: Payer: Self-pay | Admitting: Family Medicine

## 2024-10-17 ENCOUNTER — Other Ambulatory Visit: Payer: Self-pay

## 2024-10-17 ENCOUNTER — Ambulatory Visit
Admission: RE | Admit: 2024-10-17 | Discharge: 2024-10-17 | Disposition: A | Source: Ambulatory Visit | Attending: Radiation Oncology

## 2024-10-17 ENCOUNTER — Ambulatory Visit

## 2024-10-17 LAB — RAD ONC ARIA SESSION SUMMARY
Course Elapsed Days: 8
Plan Fractions Treated to Date: 5
Plan Prescribed Dose Per Fraction: 2.5 Gy
Plan Total Fractions Prescribed: 20
Plan Total Prescribed Dose: 50 Gy
Reference Point Dosage Given to Date: 12.5 Gy
Reference Point Session Dosage Given: 2.5 Gy
Session Number: 5

## 2024-10-18 ENCOUNTER — Other Ambulatory Visit: Payer: Self-pay

## 2024-10-18 ENCOUNTER — Ambulatory Visit
Admission: RE | Admit: 2024-10-18 | Discharge: 2024-10-18 | Disposition: A | Discharge status: Home / Self Care | Source: Ambulatory Visit | Attending: Radiation Oncology | Admitting: Radiation Oncology

## 2024-10-18 ENCOUNTER — Ambulatory Visit

## 2024-10-18 LAB — RAD ONC ARIA SESSION SUMMARY
Course Elapsed Days: 9
Plan Fractions Treated to Date: 6
Plan Prescribed Dose Per Fraction: 2.5 Gy
Plan Total Fractions Prescribed: 20
Plan Total Prescribed Dose: 50 Gy
Reference Point Dosage Given to Date: 15 Gy
Reference Point Session Dosage Given: 2.5 Gy
Session Number: 6

## 2024-10-19 ENCOUNTER — Other Ambulatory Visit: Payer: Self-pay

## 2024-10-19 ENCOUNTER — Ambulatory Visit
Admission: RE | Admit: 2024-10-19 | Discharge: 2024-10-19 | Disposition: A | Source: Ambulatory Visit | Attending: Radiation Oncology

## 2024-10-19 LAB — RAD ONC ARIA SESSION SUMMARY
Course Elapsed Days: 10
Plan Fractions Treated to Date: 7
Plan Prescribed Dose Per Fraction: 2.5 Gy
Plan Total Fractions Prescribed: 20
Plan Total Prescribed Dose: 50 Gy
Reference Point Dosage Given to Date: 17.5 Gy
Reference Point Session Dosage Given: 2.5 Gy
Session Number: 7

## 2024-10-22 ENCOUNTER — Ambulatory Visit

## 2024-10-23 ENCOUNTER — Ambulatory Visit

## 2024-10-23 ENCOUNTER — Telehealth: Payer: Self-pay | Admitting: Hematology and Oncology

## 2024-10-23 NOTE — Telephone Encounter (Signed)
 I spoke with patient as she called in to reschedule MD appointment from 11/08/2024 to 11/20/2024.

## 2024-10-24 ENCOUNTER — Ambulatory Visit
Admission: RE | Admit: 2024-10-24 | Discharge: 2024-10-24 | Disposition: A | Source: Ambulatory Visit | Attending: Radiation Oncology

## 2024-10-24 ENCOUNTER — Other Ambulatory Visit: Payer: Self-pay

## 2024-10-24 ENCOUNTER — Ambulatory Visit

## 2024-10-24 LAB — RAD ONC ARIA SESSION SUMMARY
Course Elapsed Days: 15
Plan Fractions Treated to Date: 8
Plan Prescribed Dose Per Fraction: 2.5 Gy
Plan Total Fractions Prescribed: 20
Plan Total Prescribed Dose: 50 Gy
Reference Point Dosage Given to Date: 20 Gy
Reference Point Session Dosage Given: 2.5 Gy
Session Number: 8

## 2024-10-25 ENCOUNTER — Ambulatory Visit
Admission: RE | Admit: 2024-10-25 | Discharge: 2024-10-25 | Disposition: A | Source: Ambulatory Visit | Attending: Radiation Oncology

## 2024-10-25 ENCOUNTER — Other Ambulatory Visit: Payer: Self-pay

## 2024-10-25 LAB — RAD ONC ARIA SESSION SUMMARY
Course Elapsed Days: 16
Plan Fractions Treated to Date: 9
Plan Prescribed Dose Per Fraction: 2.5 Gy
Plan Total Fractions Prescribed: 20
Plan Total Prescribed Dose: 50 Gy
Reference Point Dosage Given to Date: 22.5 Gy
Reference Point Session Dosage Given: 2.5 Gy
Session Number: 9

## 2024-10-26 ENCOUNTER — Ambulatory Visit: Admission: RE | Admit: 2024-10-26 | Source: Ambulatory Visit

## 2024-10-26 ENCOUNTER — Other Ambulatory Visit: Payer: Self-pay

## 2024-10-26 LAB — RAD ONC ARIA SESSION SUMMARY
Course Elapsed Days: 17
Plan Fractions Treated to Date: 10
Plan Prescribed Dose Per Fraction: 2.5 Gy
Plan Total Fractions Prescribed: 20
Plan Total Prescribed Dose: 50 Gy
Reference Point Dosage Given to Date: 25 Gy
Reference Point Session Dosage Given: 2.5 Gy
Session Number: 10

## 2024-10-29 ENCOUNTER — Ambulatory Visit

## 2024-10-30 ENCOUNTER — Ambulatory Visit

## 2024-10-31 ENCOUNTER — Ambulatory Visit

## 2024-11-01 ENCOUNTER — Ambulatory Visit

## 2024-11-02 ENCOUNTER — Ambulatory Visit

## 2024-11-05 ENCOUNTER — Ambulatory Visit

## 2024-11-06 ENCOUNTER — Ambulatory Visit

## 2024-11-07 ENCOUNTER — Ambulatory Visit

## 2024-11-08 ENCOUNTER — Inpatient Hospital Stay: Admitting: Hematology and Oncology

## 2024-11-08 ENCOUNTER — Ambulatory Visit

## 2024-11-09 ENCOUNTER — Ambulatory Visit

## 2024-11-20 ENCOUNTER — Inpatient Hospital Stay: Attending: Hematology and Oncology | Admitting: Hematology and Oncology

## 2025-01-18 ENCOUNTER — Ambulatory Visit: Payer: Self-pay

## 2025-01-29 ENCOUNTER — Ambulatory Visit

## 2025-06-19 ENCOUNTER — Encounter: Admitting: Family Medicine
